# Patient Record
Sex: Male | Born: 1950
Health system: Southern US, Community
[De-identification: ages and names within clinical notes are randomized; demographics above are authoritative.]

## PROBLEM LIST (undated history)

## (undated) DIAGNOSIS — E785 Hyperlipidemia, unspecified: Secondary | ICD-10-CM

## (undated) DIAGNOSIS — I10 Essential (primary) hypertension: Secondary | ICD-10-CM

## (undated) DIAGNOSIS — G5600 Carpal tunnel syndrome, unspecified upper limb: Secondary | ICD-10-CM

## (undated) HISTORY — DX: Essential (primary) hypertension: I10

## (undated) HISTORY — PX: OTHER SURGICAL HISTORY: SHX169

## (undated) HISTORY — DX: Hyperlipidemia, unspecified: E78.5

## (undated) HISTORY — DX: Carpal tunnel syndrome, unspecified upper limb: G56.00

---

## 2003-12-22 ENCOUNTER — Ambulatory Visit: Payer: Self-pay | Admitting: Family Medicine

## 2004-12-14 ENCOUNTER — Ambulatory Visit: Payer: Self-pay | Admitting: Family Medicine

## 2004-12-28 ENCOUNTER — Ambulatory Visit: Payer: Self-pay | Admitting: Family Medicine

## 2005-01-31 ENCOUNTER — Ambulatory Visit: Payer: Self-pay | Admitting: Family Medicine

## 2005-03-09 ENCOUNTER — Ambulatory Visit: Payer: Self-pay | Admitting: Family Medicine

## 2005-03-22 ENCOUNTER — Ambulatory Visit: Payer: Self-pay | Admitting: Family Medicine

## 2007-01-15 ENCOUNTER — Telehealth: Payer: Self-pay | Admitting: Family Medicine

## 2007-01-15 ENCOUNTER — Ambulatory Visit: Payer: Self-pay | Admitting: Family Medicine

## 2007-01-15 DIAGNOSIS — F528 Other sexual dysfunction not due to a substance or known physiological condition: Secondary | ICD-10-CM | POA: Insufficient documentation

## 2007-01-15 DIAGNOSIS — I10 Essential (primary) hypertension: Secondary | ICD-10-CM | POA: Insufficient documentation

## 2007-01-15 DIAGNOSIS — G5600 Carpal tunnel syndrome, unspecified upper limb: Secondary | ICD-10-CM | POA: Insufficient documentation

## 2007-01-15 DIAGNOSIS — M109 Gout, unspecified: Secondary | ICD-10-CM | POA: Insufficient documentation

## 2007-01-25 ENCOUNTER — Encounter: Payer: Self-pay | Admitting: Family Medicine

## 2008-03-11 ENCOUNTER — Telehealth: Payer: Self-pay | Admitting: Family Medicine

## 2008-03-20 ENCOUNTER — Ambulatory Visit: Payer: Self-pay | Admitting: Family Medicine

## 2008-03-20 LAB — CONVERTED CEMR LAB
Bilirubin Urine: NEGATIVE
Blood in Urine, dipstick: NEGATIVE
Glucose, Urine, Semiquant: NEGATIVE
Ketones, urine, test strip: NEGATIVE
Nitrite: NEGATIVE
Protein, U semiquant: NEGATIVE
Specific Gravity, Urine: 1.025
Urobilinogen, UA: 0.2
WBC Urine, dipstick: NEGATIVE
pH: 5.5

## 2008-03-26 ENCOUNTER — Ambulatory Visit: Payer: Self-pay | Admitting: Family Medicine

## 2008-03-26 DIAGNOSIS — J309 Allergic rhinitis, unspecified: Secondary | ICD-10-CM | POA: Insufficient documentation

## 2008-03-26 LAB — CONVERTED CEMR LAB
ALT: 44 units/L (ref 0–53)
AST: 33 units/L (ref 0–37)
Albumin: 4.7 g/dL (ref 3.5–5.2)
Alkaline Phosphatase: 47 units/L (ref 39–117)
BUN: 16 mg/dL (ref 6–23)
Basophils Absolute: 0.1 10*3/uL (ref 0.0–0.1)
Basophils Relative: 0.9 % (ref 0.0–3.0)
Bilirubin, Direct: 0.1 mg/dL (ref 0.0–0.3)
CO2: 31 meq/L (ref 19–32)
Calcium: 10.1 mg/dL (ref 8.4–10.5)
Chloride: 103 meq/L (ref 96–112)
Cholesterol: 198 mg/dL (ref 0–200)
Creatinine, Ser: 0.9 mg/dL (ref 0.4–1.5)
Eosinophils Absolute: 0.1 10*3/uL (ref 0.0–0.7)
Eosinophils Relative: 1.9 % (ref 0.0–5.0)
GFR calc Af Amer: 111 mL/min
GFR calc non Af Amer: 92 mL/min
Glucose, Bld: 97 mg/dL (ref 70–99)
HCT: 46 % (ref 39.0–52.0)
HDL: 43.9 mg/dL (ref >39.0)
Hemoglobin: 16.3 g/dL (ref 13.0–17.0)
LDL Cholesterol: 137 mg/dL — ABNORMAL HIGH (ref 0–99)
Lymphocytes Relative: 29.4 % (ref 12.0–46.0)
MCHC: 35.4 g/dL (ref 30.0–36.0)
MCV: 87.5 fL (ref 78.0–100.0)
Monocytes Absolute: 0.5 10*3/uL (ref 0.1–1.0)
Monocytes Relative: 7.1 % (ref 3.0–12.0)
Neutro Abs: 4.1 10*3/uL (ref 1.4–7.7)
Neutrophils Relative %: 60.7 % (ref 43.0–77.0)
PSA: 1.74 ng/mL (ref 0.10–4.00)
Platelets: 230 10*3/uL (ref 150–400)
Potassium: 4.1 meq/L (ref 3.5–5.1)
RBC: 5.25 M/uL (ref 4.22–5.81)
RDW: 12.1 % (ref 11.5–14.6)
Sodium: 142 meq/L (ref 135–145)
TSH: 1.2 microintl units/mL (ref 0.35–5.50)
Total Bilirubin: 0.9 mg/dL (ref 0.3–1.2)
Total CHOL/HDL Ratio: 4.5
Total Protein: 8.4 g/dL — ABNORMAL HIGH (ref 6.0–8.3)
Triglycerides: 86 mg/dL (ref 0–149)
VLDL: 17 mg/dL (ref 0–40)
WBC: 6.8 10*3/uL (ref 4.5–10.5)

## 2008-05-13 ENCOUNTER — Ambulatory Visit: Payer: Self-pay | Admitting: Gastroenterology

## 2008-07-03 ENCOUNTER — Telehealth (INDEPENDENT_AMBULATORY_CARE_PROVIDER_SITE_OTHER): Payer: Self-pay | Admitting: *Deleted

## 2008-08-18 ENCOUNTER — Encounter: Payer: Self-pay | Admitting: Gastroenterology

## 2008-08-18 ENCOUNTER — Ambulatory Visit: Payer: Self-pay | Admitting: Gastroenterology

## 2008-08-18 LAB — HM COLONOSCOPY

## 2008-08-21 ENCOUNTER — Encounter: Payer: Self-pay | Admitting: Gastroenterology

## 2009-12-14 ENCOUNTER — Ambulatory Visit: Payer: Self-pay | Admitting: Family Medicine

## 2009-12-14 DIAGNOSIS — I10 Essential (primary) hypertension: Secondary | ICD-10-CM | POA: Insufficient documentation

## 2010-01-21 ENCOUNTER — Ambulatory Visit: Admit: 2010-01-21 | Payer: Self-pay | Admitting: Family Medicine

## 2010-01-28 ENCOUNTER — Ambulatory Visit: Admit: 2010-01-28 | Payer: Self-pay | Admitting: Family Medicine

## 2010-02-18 NOTE — Progress Notes (Signed)
  Phone Note Outgoing Call   Call placed by: Chales Abrahams CMA,  July 03, 2008 2:15 PM Summary of Call: need to inform pt of time change of colon on the 22nd to 3 pm left message on machine to call back  Follow-up for Phone Call        pt needs to reschedule anyway due to personal conflicts.  rescheduled per pt Follow-up by: Chales Abrahams CMA,  July 03, 2008 3:00 PM

## 2010-02-18 NOTE — Procedures (Signed)
Summary: Colonoscopy   Colonoscopy  Procedure date:  08/18/2008  Findings:      Location:  [facility].    Procedures Next Due Date:    Colonoscopy: 08/2011 COLONOSCOPY PROCEDURE REPORT  PATIENT:  Brent Mccormick, Brent Mccormick  MR#:  [id number] BIRTHDATE:   [birth year], 60 yrs. old   GENDER:   male  ENDOSCOPIST:   Brent Fee, MD Referred by: Brent Mccormick, M.D.  PROCEDURE DATE:  08/18/2008 PROCEDURE:  Colonoscopy with snare polypectomy ASA CLASS:   Class II INDICATIONS: Routine Risk Screening   MEDICATIONS:    Fentanyl 100 mcg IV, Versed 10 mg IV  DESCRIPTION OF PROCEDURE:   After the risks benefits and alternatives of the procedure were thoroughly explained, informed consent was obtained.  Digital rectal exam was performed and revealed no rectal masses.   The LB [id number] endoscope was introduced through the anus and advanced to the cecum, which was identified by both the appendix and ileocecal valve, without limitations.  The quality of the prep was good, using MoviPrep.  The instrument was then slowly withdrawn as the colon was fully examined. <<PROCEDUREIMAGES>>                  <<OLD IMAGES>>  FINDINGS:  A total of 4 polyps were found and removed. One was in transverse colon, 4-94mm, removed with cold snare and sent to pathology (jar 1). Another was in transverse colon, 2-80mm, removed with cold snare and not retreived. Another was 5mm, sessile, located in sigmoid colon, removed with cold snare, sent to pathology (jar 1). The last was 17mm, pedunculated, located in sigmoid colon, completely removed with snare/cautery and sent to pathology (jar 2) (see image4, image5, image6, image7, image9, and image10).  Moderate diverticulosis was found sigmoid to descending  This was otherwise a normal examination of the colon (see image1, image2, and image11).   Retroflexed views in the rectum revealed no abnormalities.    The scope was then withdrawn from the patient and the  procedure completed.  COMPLICATIONS:   None  ENDOSCOPIC IMPRESSION:  1) Four polyps, all removed;  three were retrieved and sent to pathology  2) Moderate diverticulosis in the sigmoid to descending  3) Otherwise normal examination  RECOMMENDATIONS:  1) If the polyp(s) removed today are proven to be adenomatous (pre-cancerous) polyps, you will need a colonoscopy in 3 years. Otherwise you should continue to follow colorectal cancer screening guidelines for "routine risk" patients with a colonoscopy in 10 years.  2) You will recieve a letter within 1-2 weeks with the results of your biopsy as well as final recommendations. Please call my office if you have not received a letter after 3 weeks.  REPEAT EXAM:   await pathology   _______________________________ Brent Fee, MD     REPORT OF SURGICAL PATHOLOGY   Case #: [id number] Patient Name: Brent Mccormick, Brent Mccormick Office Chart Number:  N/A   MRN: [id number] Pathologist: Brent Hollingshead. Delila Spence, MD DOB/Age  [birth year] (Age: 60)    Gender: M Date Taken:  08/18/2008 Date Received: 08/19/2008   FINAL DIAGNOSIS   ***MICROSCOPIC EXAMINATION AND DIAGNOSIS***   1.  COLON, TRANSVERSE AND SIGMOID, POLYPS:   -  ONE TUBULAR ADENOMA, ONE FRAGMENT OF BENIGN COLONIC MUCOSA AND TWO POLYPOID  FRAGMENTS OF HYPERPLASTIC COLONIC MUCOSA WITH UNDERLYING LEIOMYOMA                (TWO) -  NO HIGH GRADE DYSPLASIA OR INVASIVE MALIGNANCY IDENTIFIED.     2.  SIGMOID  COLON, POLYP:   -  TUBULOVILLOUS ADENOMA.  NO HIGH GRADE DYSPLASIA OR MALIGNANCY IDENTIFIED. -  BASE OF POLYP NEGATIVE FOR ADENOMATOUS CHANGE, SEE COMMENT.   COMMENT 2.  There are dilated glands with associated extravasated mucin and evidence of old hemorrhage in the underlying stroma.  These are glandular inclusions secondary to torsion and hemorrhage at this site.  No high grade dysplasia or invasive carcinoma is identified. Dr.Li concurs.    The base of the polyp is inked and is  negative for adenomatous change.  (EAA:kv 08-20-08)     kv Date Reported:  08/20/2008     Alden Server A. Delila Spence, MD   [facility] [address] [place], [place]  [address]    Dear Brent Mccormick,   The polyp(s) removed during your recent procedure were proven to be adenomatous.  These are pre-cancerous polyps that may have grown into cancers if they had not been removed.  Based on current nationally recognized surveillance guidelines, I recommend that you have a repeat colonoscopy in 3 years.   We will therefore put your information in our reminder system and will contact you in 3 years to schedule a repeat procedure.  Please call if you have any questions or concerns.       Sincerely,  Brent Fee MD  This letter has been electronically signed by your physician.  This report was created from the original endoscopy report, which was reviewed and signed by the above listed endoscopist.      Appended Document: Colonoscopy reviewed

## 2010-02-18 NOTE — Progress Notes (Signed)
Summary: rx benicar needs ov   Phone Note From Pharmacy   Caller: [facility]  Reason for Call: Needs renewal Summary of Call: benicar 40 mg  Initial call taken by: Pura Spice, RN,  March 11, 2008 1:46 PM  Follow-up for Phone Call        ok per dr Scotty Court  pt must be seen. prior to next refills  Follow-up by: Pura Spice, RN,  March 11, 2008 1:47 PM    New/Updated Medications: BENICAR 20 MG TABS (OLMESARTAN MEDOXOMIL) 1 by mouth once daily for hypertension  NEEDS OFFICE VISIT   Prescriptions: BENICAR 20 MG TABS (OLMESARTAN MEDOXOMIL) 1 by mouth once daily for hypertension  NEEDS OFFICE VISIT  #30 x 0   Entered by:   Pura Spice, RN   Authorized by:   Judithann Sheen MD   Signed by:   Pura Spice, RN on 03/11/2008   Method used:   Electronically to        [facility]* (retail)       [address]       [address] [employer]       [place], [place]  [address]       Ph: [phone]       Fax: [phone]   RxID:   [id number]

## 2010-02-18 NOTE — Assessment & Plan Note (Signed)
Summary: severe gout pain in foot/swollen/cjr   Vital Signs:  Patient profile:   60 year old male Weight:      175 pounds BMI:     26.70 O2 Sat:      98 % Temp:     98.6 degrees F Pulse rate:   71 / minute BP sitting:   140 / 84  (left arm)  Vitals Entered By: Pura Spice, RN (December 14, 2009 2:25 PM) CC: gout rt great toe /foot also has had headache since yest  refill indomethacin   History of Present Illness: Here for a gout flare in the right great toe for the past 3 days. He has run out of Indocin to take. His BP has been stable, but he has not had a cpx with labs for almost 2 years.   Allergies (verified): No Known Drug Allergies  Past History:  Past Medical History: hx carpal tunnel syndrome Gout Hypertension  Review of Systems  The patient denies anorexia, fever, weight loss, weight gain, vision loss, decreased hearing, hoarseness, chest pain, syncope, dyspnea on exertion, peripheral edema, prolonged cough, headaches, hemoptysis, abdominal pain, melena, hematochezia, severe indigestion/heartburn, hematuria, incontinence, genital sores, muscle weakness, suspicious skin lesions, transient blindness, difficulty walking, depression, unusual weight change, abnormal bleeding, enlarged lymph nodes, angioedema, breast masses, and testicular masses.    Physical Exam  General:  Well-developed,well-nourished,in no acute distress; alert,appropriate and cooperative throughout examination Lungs:  Normal respiratory effort, chest expands symmetrically. Lungs are clear to auscultation, no crackles or wheezes. Heart:  Normal rate and regular rhythm. S1 and S2 normal without gallop, murmur, click, rub or other extra sounds. Extremities:  the right great toe is red, warm, swollen, and tender at the MTP joint   Impression & Recommendations:  Problem # 1:  GOUT (ICD-274.9)  His updated medication list for this problem includes:    Indomethacin 50 Mg Caps (Indomethacin) .Marland Kitchen... 1  three times a day as needed for gout    Diclofenac Sodium 75 Mg Tbec (Diclofenac sodium) .Marland Kitchen... 1 two times a day after meals for inflammation  Orders: Depo- Medrol 40mg  (J1030) Depo- Medrol 80mg  (J1040) Admin of Therapeutic Inj  intramuscular or subcutaneous (478[address])  Problem # 2:  HYPERTENSION (ICD-401.9)  His updated medication list for this problem includes:    Benicar 20 Mg Tabs (Olmesartan medoxomil) .Marland Kitchen... 1 by mouth once daily for hypertension  needs office visit  Complete Medication List: 1)  Indomethacin 50 Mg Caps (Indomethacin) .Marland Kitchen.. 1 three times a day as needed for gout 2)  Benicar 20 Mg Tabs (Olmesartan medoxomil) .Marland Kitchen.. 1 by mouth once daily for hypertension  needs office visit 3)  Viagra 100 Mg Tabs (Sildenafil citrate) 4)  Diclofenac Sodium 75 Mg Tbec (Diclofenac sodium) .Marland Kitchen.. 1 two times a day after meals for inflammation 5)  Lorcet 10/650 10-650 Mg Tabs (Hydrocodone-acetaminophen) .Marland Kitchen.. 1 every 4-6 hrs as needed not to exceed 3 per day  Patient Instructions: 1)  set up a cpx soon  Prescriptions: INDOMETHACIN 50 MG CAPS (INDOMETHACIN) 1 three times a day as needed for gout  #60 x 2   Entered and Authorized by:   Nelwyn Salisbury MD   Signed by:   Nelwyn Salisbury MD on 12/14/2009   Method used:   [facility]        [facility] [phone][facility] (retail)       [address]       [place], [place]  [address]       Ph: [phone] or [phone]       Fax: [phone]   RxID:   [id number]    Medication Administration  Injection # 1:    Medication: Depo- Medrol 40mg     Diagnosis: GOUT (ICD-274.9)    Route: IM    Site: RUOQ gluteus    Exp Date: 05/2012    Lot #: [id number]    Mfr: Pharmacia    Patient tolerated injection without complications    Given by: Pura Spice, RN (December 14, 2009 5:39 PM)  Injection # 2:    Medication: Depo- Medrol 80mg     Diagnosis: GOUT (ICD-274.9)    Route: IM    Site: RUOQ gluteus    Exp Date:  05/2012    Lot #: [id number]    Mfr: Pharmacia    Patient tolerated injection without complications    Given by: Pura Spice, RN (December 14, 2009 5:40 PM)  Orders Added: 1)  Est. Patient Level IV [27253] 2)  Depo- Medrol 40mg  [J1030] 3)  Depo- Medrol 80mg  [J1040] 4)  Admin of Therapeutic Inj  intramuscular or subcutaneous [66440]

## 2010-02-18 NOTE — Assessment & Plan Note (Signed)
Summary: CARPAL TUNNEL FLARE/DM   Vital Signs:  Patient Profile:   60 Years Old Male Temp:     99.1 degrees F Pulse rate:   84 / minute BP sitting:   120 / 94  (left arm) Cuff size:   regular  Vitals Entered By: Pura Spice, RN (January 15, 2007 4:52 PM)                 Chief Complaint:  CARPAL TUNNEL FLARE UP RT HAND WITH TINGLING. TOOK 3 MOTRIN TODAY ADVIL YEST.Marland Kitchen  History of Present Illness: has had carpaL TUNNELL syndrome rt hand, duration for some time has been tx with wrist brace but having considerable pain wrist as well as tingling in thumb and first finger,all increased in severity forgot BP rx today  no other problems, refilled benicar with mailorder fo #90 Pt to schedule cpx  Current Allergies (reviewed today): No known allergies      Review of Systems      See HPI   Physical Exam  General:     Well-developed,well-nourished,in no acute distress; alert,appropriate and cooperative throughout examination Msk:     rt wrist swollen and painful postennell sign    Complete Medication List: 1)  Indomethacin 50 Mg Caps (Indomethacin) .Marland Kitchen.. 1 three times a day as needed for gout 2)  Benicar 20 Mg Tabs (Olmesartan medoxomil) 3)  Viagra 100 Mg Tabs (Sildenafil citrate) 4)  Diclofenac Sodium 75 Mg Tbec (Diclofenac sodium) .Marland Kitchen.. 1 two times a day after meals for inflammation 5)  Lorcet 10/650 10-650 Mg Tabs (Hydrocodone-acetaminophen) .Marland Kitchen.. 1 every 4-6 hrs as needed   Patient Instructions: 1)  depomerol injection 2)  diclofenac  3)  cold packs  4)  referral to Dr. Tharon Aquas    Prescriptions: LORCET 10/650 10-650 MG  TABS (HYDROCODONE-ACETAMINOPHEN) 1 every 4-6 hrs as needed  #60 x 5   Entered and Authorized by:   Judithann Sheen MD   Signed by:   Judithann Sheen MD on 01/16/2007   Method used:   Handwritten   RxID:   [id number] DICLOFENAC SODIUM 75 MG  TBEC (DICLOFENAC SODIUM) 1 two times a day after meals for inflammation  #60 x  11   Entered and Authorized by:   Judithann Sheen MD   Signed by:   Judithann Sheen MD on 01/16/2007   Method used:   Handwritten   RxID:   [id number]  ]  Medication Administration  Injection # 1:    Medication: Depo- Medrol 80mg     Diagnosis: CARPAL TUNNEL SYNDROME, RIGHT (ICD-354.0)    Route: IM    Site: RUOQ gluteus    Exp Date: 01/2009    Lot #: [id number]    Mfr: Pharmacia    Patient tolerated injection without complications    Given by: dr wr stafford  Injection # 2:    Medication: Depo- Medrol 40mg     Diagnosis: CARPAL TUNNEL SYNDROME, RIGHT (ICD-354.0)    Route: IM    Site: RUOQ gluteus    Exp Date: 01/2009    Lot #: [id number]    Mfr: Pharmacia    Patient tolerated injection without complications    Given by: dr wr stafford  Orders Added: 1)  Est. Patient Level III [84696] 2)  Orthopedic Surgeon Referral [Ortho Surgeon] 3)  Depo- Medrol 40mg  [J1030] 4)  Admin of Therapeutic Inj  intramuscular or subcutaneous [90772] 5)  Depo- Medrol 80mg  [J1040]

## 2010-02-18 NOTE — Letter (Signed)
Summary: GOC note  GOC note   Imported By: Kassie Mends 02/02/2007 13:52:58  _____________________________________________________________________  External Attachment:    Type:   Image     Comment:   GOC note

## 2010-02-18 NOTE — Letter (Signed)
Summary: Results Letter  [facility]  [address] [place], [place] [address]   Phone: [phone]  Fax: [phone]        August 21, 2008 MRN: [id number]    [facility] [address] [place], [place]  [address]    Dear Mr. Hennington,   The polyp(s) removed during your recent procedure were proven to be adenomatous.  These are pre-cancerous polyps that may have grown into cancers if they had not been removed.  Based on current nationally recognized surveillance guidelines, I recommend that you have a repeat colonoscopy in 3 years.   We will therefore put your information in our reminder system and will contact you in 3 years to schedule a repeat procedure.  Please call if you have any questions or concerns.       Sincerely,  Rachael Fee MD  This letter has been electronically signed by your physician.

## 2010-02-18 NOTE — Miscellaneous (Signed)
Summary: DIR COLON-SCRNG-AGE/YF  Clinical Lists Changes  Medications: Added new medication of MOVIPREP 100 GM  SOLR (PEG-KCL-NACL-NASULF-NA ASC-C) As per prep instructions. - Signed Rx of MOVIPREP 100 GM  SOLR (PEG-KCL-NACL-NASULF-NA ASC-C) As per prep instructions.;  #1 x 0;  Signed;  Entered by: Darlyn Read RN;  Authorized by: Rachael Fee MD;  Method used: [facility] to [facility] [phone]*, [address], [place], [place], [place]  [address], Ph: [phone] or [phone], Fax: [phone]    Prescriptions: MOVIPREP 100 GM  SOLR (PEG-KCL-NACL-NASULF-NA ASC-C) As per prep instructions.  #1 x 0   Entered by:   Darlyn Read RN   Authorized by:   Rachael Fee MD   Signed by:   Darlyn Read RN on 05/13/2008   Method used:   Electronically to        [facility] [phone]* (retail)       [address]       [place], [place]  [address]       Ph: [phone] or [phone]       Fax: [phone]   RxID:   [id number]

## 2010-02-18 NOTE — Progress Notes (Signed)
  Medications Added BENICAR 20 MG TABS (OLMESARTAN MEDOXOMIL)  VIAGRA 100 MG TABS (SILDENAFIL CITRATE)        Phone Note Call from Patient   Caller: Patient Call For: Dr. Scotty Court Summary of Call: Pt is c/o flare up of carpal tunnel and needs to be seen today. [phone] Initial call taken by: Lynann Beaver CMA,  January 15, 2007 8:45 AM  Follow-up for Phone Call        see if can come today at 4 pm and put in sch  please  Follow-up by: Pura Spice, RN,  January 15, 2007 10:01 AM  Additional Follow-up for Phone Call Additional follow up Details #1::        Pt. given message. Additional Follow-up by: Lynann Beaver CMA,  January 15, 2007 10:22 AM    New/Updated Medications: BENICAR 20 MG TABS (OLMESARTAN MEDOXOMIL)  VIAGRA 100 MG TABS (SILDENAFIL CITRATE)

## 2010-02-18 NOTE — Assessment & Plan Note (Signed)
Summary: cpx/mhf   Vital Signs:  Patient profile:   60 year old male Height:      68 inches Weight:      166 pounds BMI:     25.33 O2 Sat:      98 % Temp:     97.8 degrees F Pulse rate:   71 / minute BP sitting:   140 / 80  (left arm)  Vitals Entered By: Pura Spice, RN (March 26, 2008 9:43 AM)  History of Present Illness: pt in for cpx BP under control Dr. Butler Denmark resolved carpal tunnel syndreome arthritis undr control atrhritis doing well  Allergies (verified): No Known Drug Allergies  Review of Systems      See HPI General:  Denies chills, fatigue, fever, loss of appetite, malaise, sleep disorder, sweats, weakness, and weight loss. Eyes:  Denies blurring, discharge, double vision, eye irritation, eye pain, halos, itching, light sensitivity, red eye, vision loss-1 eye, and vision loss-both eyes. ENT:  Denies decreased hearing, difficulty swallowing, ear discharge, earache, hoarseness, nasal congestion, nosebleeds, postnasal drainage, ringing in ears, sinus pressure, and sore throat. CV:  Denies bluish discoloration of lips or nails, chest pain or discomfort, difficulty breathing at night, difficulty breathing while lying down, fainting, fatigue, leg cramps with exertion, lightheadness, near fainting, palpitations, shortness of breath with exertion, swelling of feet, swelling of hands, and weight gain. Resp:  Denies chest discomfort, chest pain with inspiration, cough, coughing up blood, excessive snoring, hypersomnolence, morning headaches, pleuritic, shortness of breath, sputum productive, and wheezing. GI:  Denies abdominal pain, bloody stools, change in bowel habits, constipation, dark tarry stools, diarrhea, excessive appetite, gas, hemorrhoids, indigestion, loss of appetite, nausea, vomiting, vomiting blood, and yellowish skin color. GU:  Denies decreased libido, discharge, dysuria, erectile dysfunction, genital sores, hematuria, incontinence, nocturia, urinary frequency,  and urinary hesitancy. MS:  See HPI; Complains of joint pain. Derm:  Denies changes in color of skin, changes in nail beds, dryness, excessive perspiration, flushing, hair loss, insect bite(s), itching, lesion(s), poor wound healing, and rash. Neuro:  Denies brief paralysis, difficulty with concentration, disturbances in coordination, falling down, headaches, inability to speak, memory loss, numbness, poor balance, seizures, sensation of room spinning, tingling, tremors, visual disturbances, and weakness. Psych:  Denies alternate hallucination ( auditory/visual), anxiety, depression, easily angered, easily tearful, irritability, mental problems, panic attacks, sense of great danger, suicidal thoughts/plans, thoughts of violence, unusual visions or sounds, and thoughts /plans of harming others. Endo:  Denies cold intolerance, excessive hunger, excessive thirst, excessive urination, heat intolerance, polyuria, and weight change.  Physical Exam  General:  Well-developed,well-nourished,in no acute distress; alert,appropriate and cooperative throughout examination Head:  Normocephalic and atraumatic without obvious abnormalities. No apparent alopecia or balding. Eyes:  No corneal or conjunctival inflammation noted. EOMI. Perrla. Funduscopic exam benign, without hemorrhages, exudates or papilledema. Vision grossly normal. Ears:  External ear exam shows no significant lesions or deformities.  Otoscopic examination reveals clear canals, tympanic membranes are intact bilaterally without bulging, retraction, inflammation or discharge. Hearing is grossly normal bilaterally. Nose:  External nasal examination shows no deformity or inflammation. Nasal mucosa are pink and moist without lesions or exudates. Mouth:  Oral mucosa and oropharynx without lesions or exudates.  Teeth in good repair. Neck:  No deformities, masses, or tenderness noted. Chest Wall:  No deformities, masses, tenderness or gynecomastia  noted. Breasts:  No masses or gynecomastia noted Lungs:  Normal respiratory effort, chest expands symmetrically. Lungs are clear to auscultation, no crackles or wheezes. Heart:  Normal  rate and regular rhythm. S1 and S2 normal without gallop, murmur, click, rub or other extra sounds. Abdomen:  Bowel sounds positive,abdomen soft and non-tender without masses, organomegaly or hernias noted. Rectal:  No external abnormalities noted. Normal sphincter tone. No rectal masses or tenderness. Genitalia:  Testes bilaterally descended without nodularity, tenderness or masses. No scrotal masses or lesions. No penis lesions or urethral discharge. Prostate:  Prostate gland firm and smooth, no enlargement, nodularity, tenderness, mass, asymmetry or induration. Msk:  No deformity or scoliosis noted of thoracic or lumbar spine.   Pulses:  R and L carotid,radial,femoral,dorsalis pedis and posterior tibial pulses are full and equal bilaterally Extremities:  No clubbing, cyanosis, edema, or deformity noted with normal full range of motion of all joints.   Neurologic:  No cranial nerve deficits noted. Station and gait are normal. Plantar reflexes are down-going bilaterally. DTRs are symmetrical throughout. Sensory, motor and coordinative functions appear intact. Skin:  Intact without suspicious lesions or rashes Cervical Nodes:  No lymphadenopathy noted Axillary Nodes:  No palpable lymphadenopathy Inguinal Nodes:  No significant adenopathy Psych:  Cognition and judgment appear intact. Alert and cooperative with normal attention span and concentration. No apparent delusions, illusions, hallucinations   Impression & Recommendations:  Problem # 1:  GOUT (ICD-274.9) Assessment Improved  His updated medication list for this problem includes:    Indomethacin 50 Mg Caps (Indomethacin) .Marland Kitchen... 1 three times a day as needed for gout    Diclofenac Sodium 75 Mg Tbec (Diclofenac sodium) .Marland Kitchen... 1 two times a day after meals  for inflammation  Complete Medication List: 1)  Indomethacin 50 Mg Caps (Indomethacin) .Marland Kitchen.. 1 three times a day as needed for gout 2)  Benicar 20 Mg Tabs (Olmesartan medoxomil) .Marland Kitchen.. 1 by mouth once daily for hypertension  needs office visit 3)  Viagra 100 Mg Tabs (Sildenafil citrate) 4)  Diclofenac Sodium 75 Mg Tbec (Diclofenac sodium) .Marland Kitchen.. 1 two times a day after meals for inflammation 5)  Lorcet 10/650 10-650 Mg Tabs (Hydrocodone-acetaminophen) .Marland Kitchen.. 1 every 4-6 hrs as needed not to exceed 3 per day  Other Orders: EKG w/ Interpretation (93[address]) Tdap => 42yrs IM ([address]) Admin 1st Vaccine ([address]) Gastroenterology Referral (GI) Prescriptions: LORCET 10/650 10-650 MG  TABS (HYDROCODONE-ACETAMINOPHEN) 1 every 4-6 hrs as needed not to exceed 3 per day  #60 x 5   Entered by:   Pura Spice, RN   Authorized by:   Judithann Sheen MD   Signed by:   Pura Spice, RN on 04/15/2008   Method used:   Printed then faxed to ...       [facility] [phone]* (retail)       [address]       [place], [place]  [address]       Ph: [phone] or [phone]       Fax: [phone]   RxID:   [id number]         Immunizations Administered:  Tetanus Vaccine:    Vaccine Type: Tdap    Site: left deltoid    Mfr: GlaxoSmithKline    Dose: 0.5 ml    Route: IM    Given by: Pura Spice, RN    Exp. Date: 10/02/2009    Lot #: [id number]

## 2010-03-08 ENCOUNTER — Other Ambulatory Visit: Payer: Self-pay | Admitting: Family Medicine

## 2010-04-02 ENCOUNTER — Encounter: Payer: Self-pay | Admitting: Family Medicine

## 2010-04-08 ENCOUNTER — Other Ambulatory Visit: Payer: Self-pay

## 2010-04-12 ENCOUNTER — Ambulatory Visit (INDEPENDENT_AMBULATORY_CARE_PROVIDER_SITE_OTHER): Payer: 59 | Admitting: Internal Medicine

## 2010-04-12 ENCOUNTER — Encounter: Payer: Self-pay | Admitting: Internal Medicine

## 2010-04-12 VITALS — BP 132/84 | HR 94 | Temp 98.2°F | Ht 70.0 in | Wt 163.0 lb

## 2010-04-12 DIAGNOSIS — J069 Acute upper respiratory infection, unspecified: Secondary | ICD-10-CM

## 2010-04-12 MED ORDER — DOXYCYCLINE HYCLATE 100 MG PO TABS: 100.0000 mg | ORAL_TABLET | Freq: Two times a day (BID) | ORAL | Status: DC

## 2010-04-12 MED ORDER — DOXYCYCLINE HYCLATE 100 MG PO TABS: 100.0000 mg | ORAL_TABLET | Freq: Two times a day (BID) | ORAL | Status: AC

## 2010-04-15 ENCOUNTER — Encounter: Payer: Self-pay | Admitting: Family Medicine

## 2010-04-26 ENCOUNTER — Encounter: Payer: Self-pay | Admitting: Internal Medicine

## 2010-04-26 DIAGNOSIS — J069 Acute upper respiratory infection, unspecified: Secondary | ICD-10-CM | POA: Insufficient documentation

## 2010-04-26 NOTE — Progress Notes (Signed)
  Subjective:    Patient ID: Brent Mccormick, male    DOB: 30-May-1950, 60 y.o.   MRN: [id number]  HPI Pt presents to clinic for evaluation of cough. Notes 5d h/o scratchy throat, HA, ear pain without discharge and cough productive for yellow sputum. No hemoptysis, dyspnea or wheezing. Using cough drops for sxs. +sick exposure. No alleviating or exacerbating factors.  Reviewed pmh, medications and allergies    Review of Systems see hpi     Objective:   Physical Exam  Nursing note and vitals reviewed. Constitutional: He appears well-developed and well-nourished. No distress.  HENT:  Head: Normocephalic and atraumatic.  Right Ear: Tympanic membrane, external ear and ear canal normal.  Left Ear: Tympanic membrane, external ear and ear canal normal.  Nose: Nose normal.  Mouth/Throat: Oropharynx is clear and moist. No oropharyngeal exudate.  Eyes: Conjunctivae are normal. No scleral icterus.  Neck: Neck supple.  Cardiovascular: Normal rate, regular rhythm and normal heart sounds.  Exam reveals no gallop and no friction rub.   No murmur heard. Pulmonary/Chest: Effort normal and breath sounds normal. No respiratory distress. He has no wheezes. He has no rales.  Lymphadenopathy:    He has no cervical adenopathy.  Neurological: He is alert.  Skin: Skin is warm and dry. He is not diaphoretic.          Assessment & Plan:

## 2010-04-26 NOTE — Assessment & Plan Note (Signed)
Continue sx tx. Given doxycycline to hold. Begin abx if no improvement of sx's after total of 8-10 days since onset.

## 2010-05-06 ENCOUNTER — Other Ambulatory Visit: Payer: Self-pay | Admitting: Family Medicine

## 2010-05-06 ENCOUNTER — Other Ambulatory Visit (INDEPENDENT_AMBULATORY_CARE_PROVIDER_SITE_OTHER): Payer: 59

## 2010-05-06 DIAGNOSIS — Z0000 Encounter for general adult medical examination without abnormal findings: Secondary | ICD-10-CM

## 2010-05-06 LAB — URINALYSIS
Bilirubin Urine: NEGATIVE
Hgb urine dipstick: NEGATIVE
Ketones, ur: NEGATIVE
Leukocytes, UA: NEGATIVE
Nitrite: NEGATIVE
Specific Gravity, Urine: 1.025 (ref 1.000–1.030)
Total Protein, Urine: NEGATIVE
Urine Glucose: NEGATIVE
Urobilinogen, UA: 0.2 (ref 0.0–1.0)
pH: 6 (ref 5.0–8.0)

## 2010-05-06 LAB — CBC WITH DIFFERENTIAL/PLATELET
Basophils Absolute: 0 10*3/uL (ref 0.0–0.1)
Basophils Relative: 0.6 % (ref 0.0–3.0)
Eosinophils Absolute: 0.2 10*3/uL (ref 0.0–0.7)
Eosinophils Relative: 3.1 % (ref 0.0–5.0)
HCT: 44.1 % (ref 39.0–52.0)
Hemoglobin: 15.1 g/dL (ref 13.0–17.0)
Lymphocytes Relative: 28.2 % (ref 12.0–46.0)
Lymphs Abs: 2.1 10*3/uL (ref 0.7–4.0)
MCHC: 34.1 g/dL (ref 30.0–36.0)
MCV: 88.5 fl (ref 78.0–100.0)
Monocytes Absolute: 0.5 10*3/uL (ref 0.1–1.0)
Monocytes Relative: 6.4 % (ref 3.0–12.0)
Neutro Abs: 4.7 10*3/uL (ref 1.4–7.7)
Neutrophils Relative %: 61.7 % (ref 43.0–77.0)
Platelets: 262 10*3/uL (ref 150.0–400.0)
RBC: 4.98 Mil/uL (ref 4.22–5.81)
RDW: 12.6 % (ref 11.5–14.6)
WBC: 7.6 10*3/uL (ref 4.5–10.5)

## 2010-05-06 LAB — BASIC METABOLIC PANEL
BUN: 14 mg/dL (ref 6–23)
CO2: 30 mEq/L (ref 19–32)
Calcium: 9.6 mg/dL (ref 8.4–10.5)
Chloride: 105 mEq/L (ref 96–112)
Creatinine, Ser: 0.9 mg/dL (ref 0.4–1.5)
GFR: 89.87 mL/min (ref >60.00)
Glucose, Bld: 85 mg/dL (ref 70–99)
Potassium: 4.7 mEq/L (ref 3.5–5.1)
Sodium: 141 mEq/L (ref 135–145)

## 2010-05-06 LAB — HEPATIC FUNCTION PANEL
ALT: 44 U/L (ref 0–53)
AST: 34 U/L (ref 0–37)
Albumin: 4 g/dL (ref 3.5–5.2)
Alkaline Phosphatase: 46 U/L (ref 39–117)
Bilirubin, Direct: 0.1 mg/dL (ref 0.0–0.3)
Total Bilirubin: 0.7 mg/dL (ref 0.3–1.2)
Total Protein: 7.4 g/dL (ref 6.0–8.3)

## 2010-05-06 LAB — LIPID PANEL
Cholesterol: 175 mg/dL (ref 0–200)
HDL: 43.6 mg/dL (ref >39.00)
LDL Cholesterol: 112 mg/dL — ABNORMAL HIGH (ref 0–99)
Total CHOL/HDL Ratio: 4
Triglycerides: 99 mg/dL (ref 0.0–149.0)
VLDL: 19.8 mg/dL (ref 0.0–40.0)

## 2010-05-06 LAB — TSH: TSH: 1.49 u[IU]/mL (ref 0.35–5.50)

## 2010-05-13 ENCOUNTER — Ambulatory Visit (INDEPENDENT_AMBULATORY_CARE_PROVIDER_SITE_OTHER): Payer: 59 | Admitting: Family Medicine

## 2010-05-13 VITALS — BP 102/72 | HR 65 | Temp 97.9°F | Resp 26 | Wt 159.0 lb

## 2010-05-13 DIAGNOSIS — G5600 Carpal tunnel syndrome, unspecified upper limb: Secondary | ICD-10-CM

## 2010-05-13 DIAGNOSIS — Z8249 Family history of ischemic heart disease and other diseases of the circulatory system: Secondary | ICD-10-CM

## 2010-05-13 DIAGNOSIS — I10 Essential (primary) hypertension: Secondary | ICD-10-CM

## 2010-05-13 DIAGNOSIS — Z0000 Encounter for general adult medical examination without abnormal findings: Secondary | ICD-10-CM

## 2010-05-13 DIAGNOSIS — M109 Gout, unspecified: Secondary | ICD-10-CM

## 2010-05-13 LAB — PSA: PSA: 2.72 ng/mL (ref 0.10–4.00)

## 2010-05-13 LAB — URIC ACID: Uric Acid, Serum: 7.3 mg/dL (ref 4.0–7.8)

## 2010-05-13 MED ORDER — BENICAR 20 MG PO TABS: 20.0000 mg | ORAL_TABLET | Freq: Every day | ORAL | Status: DC

## 2010-05-13 MED ORDER — SILDENAFIL CITRATE 100 MG PO TABS: 100.0000 mg | ORAL_TABLET | Freq: Every day | ORAL | Status: DC | PRN

## 2010-05-13 MED ORDER — INDOMETHACIN 50 MG PO CAPS: 50.0000 mg | ORAL_CAPSULE | Freq: Three times a day (TID) | ORAL | Status: DC

## 2010-05-14 LAB — VITAMIN D 25 HYDROXY (VIT D DEFICIENCY, FRACTURES): Vit D, 25-Hydroxy: 57 ng/mL (ref 30–89)

## 2010-05-16 ENCOUNTER — Encounter: Payer: Self-pay | Admitting: Family Medicine

## 2010-05-16 NOTE — Patient Instructions (Signed)
His echo examination revealed that you do her well electrocardiogram was normal laboratory studies are under good control We will refill your medication

## 2010-05-16 NOTE — Progress Notes (Signed)
  Subjective:    Patient ID: Brent Mccormick, male    DOB: [birth year], 60 y.o.   MRN: [id number] This 60 year old white male married and is in for and to discuss his health status as far as hypertension and other medical problems as well as his gout treatment. he is in for a health maintenance exam also appeared relates his blood pressure is been well controlled and his also died at 102/72 years he had problems with his right big toe and saw Dr. Corwin Levins Who related it was gout related patient desires to refill his medicines examHPI    Review of Systems  Constitutional: Negative.   HENT: Negative.   Eyes: Negative.   Respiratory: Negative.   Cardiovascular: Negative.   Gastrointestinal: Negative.   Genitourinary: Negative.   Musculoskeletal: Positive for arthralgias.       Gout episodesalso has problem with his right wrist which is diagnosed previously as carpal tunnel syndrome but resolved without surgery  Neurological: Negative.   Hematological: Negative.   Psychiatric/Behavioral: Negative.        Objective:   Physical Exam The patient is well well well-nourished white male who appears in no distress pleasant very cooperative  HEENT negative carotid pulses good thyroid is normal Chest and lungs completely normal breath sounds are normal no dullness to percussion no rales no wheeze and Heart no cardiomegaly heart sounds are without murmur for pulsation good and equal bilaterally echocardiogram normal Abdomen liver spleen kidneys nonpalpable lung masses felt bowel sounds were normal aorta percusses normal Genitalia normal testicles normal no hernia Rectal examination negative with prostate normal size smooth no tenderness Extremities examination negative at this time Neurological no positive finding Skin negative       Assessment & Plan:  Physical examination revealed a normal healthy male in no distress his medical problems of hypertension and gout under control 2 refill his  medications

## 2010-06-10 NOTE — Progress Notes (Signed)
Quick Note:  Pt is aware of lab results  ______

## 2010-09-07 ENCOUNTER — Other Ambulatory Visit: Payer: Self-pay | Admitting: Family Medicine

## 2010-09-08 ENCOUNTER — Other Ambulatory Visit: Payer: Self-pay | Admitting: Family Medicine

## 2010-09-08 NOTE — Telephone Encounter (Signed)
Called pt to see which pharmacy he wanted his rx to be sent to.

## 2010-09-13 ENCOUNTER — Telehealth: Payer: Self-pay | Admitting: Family Medicine

## 2010-09-13 NOTE — Telephone Encounter (Signed)
rx sent in to pharmacy.  Pt is aware.   

## 2010-09-13 NOTE — Telephone Encounter (Signed)
Pt  Requesting refill on BENICAR 20 MG tablet [[id number]]  Pt requesting you contact when prescription is placed

## 2011-05-17 ENCOUNTER — Ambulatory Visit: Payer: 59 | Admitting: Family Medicine

## 2011-05-17 ENCOUNTER — Encounter: Payer: Self-pay | Admitting: Family Medicine

## 2011-05-18 ENCOUNTER — Encounter: Payer: Self-pay | Admitting: Family Medicine

## 2011-05-18 ENCOUNTER — Ambulatory Visit (INDEPENDENT_AMBULATORY_CARE_PROVIDER_SITE_OTHER): Payer: 59 | Admitting: Family Medicine

## 2011-05-18 VITALS — BP 120/88 | Temp 98.2°F | Ht 70.0 in | Wt 172.0 lb

## 2011-05-18 DIAGNOSIS — M109 Gout, unspecified: Secondary | ICD-10-CM

## 2011-05-18 DIAGNOSIS — F528 Other sexual dysfunction not due to a substance or known physiological condition: Secondary | ICD-10-CM

## 2011-05-18 DIAGNOSIS — I10 Essential (primary) hypertension: Secondary | ICD-10-CM

## 2011-05-18 LAB — CBC WITH DIFFERENTIAL/PLATELET
Basophils Absolute: 0 10*3/uL (ref 0.0–0.1)
Basophils Relative: 0.5 % (ref 0.0–3.0)
Eosinophils Absolute: 0.1 10*3/uL (ref 0.0–0.7)
Eosinophils Relative: 1.9 % (ref 0.0–5.0)
HCT: 46.4 % (ref 39.0–52.0)
Hemoglobin: 15.4 g/dL (ref 13.0–17.0)
Lymphocytes Relative: 25.1 % (ref 12.0–46.0)
Lymphs Abs: 1.9 10*3/uL (ref 0.7–4.0)
MCHC: 33.2 g/dL (ref 30.0–36.0)
MCV: 89.5 fl (ref 78.0–100.0)
Monocytes Absolute: 0.6 10*3/uL (ref 0.1–1.0)
Monocytes Relative: 8 % (ref 3.0–12.0)
Neutro Abs: 4.9 10*3/uL (ref 1.4–7.7)
Neutrophils Relative %: 64.5 % (ref 43.0–77.0)
Platelets: 201 10*3/uL (ref 150.0–400.0)
RBC: 5.18 Mil/uL (ref 4.22–5.81)
RDW: 13.2 % (ref 11.5–14.6)
WBC: 7.6 10*3/uL (ref 4.5–10.5)

## 2011-05-18 LAB — HEPATIC FUNCTION PANEL
ALT: 37 U/L (ref 0–53)
AST: 32 U/L (ref 0–37)
Albumin: 4.3 g/dL (ref 3.5–5.2)
Alkaline Phosphatase: 45 U/L (ref 39–117)
Bilirubin, Direct: 0.1 mg/dL (ref 0.0–0.3)
Total Bilirubin: 0.4 mg/dL (ref 0.3–1.2)
Total Protein: 7.5 g/dL (ref 6.0–8.3)

## 2011-05-18 LAB — POCT URINALYSIS DIPSTICK
Bilirubin, UA: NEGATIVE
Blood, UA: NEGATIVE
Glucose, UA: NEGATIVE
Ketones, UA: NEGATIVE
Leukocytes, UA: NEGATIVE
Nitrite, UA: NEGATIVE
Protein, UA: NEGATIVE
Spec Grav, UA: 1.02
Urobilinogen, UA: 0.2
pH, UA: 6.5

## 2011-05-18 LAB — BASIC METABOLIC PANEL
BUN: 18 mg/dL (ref 6–23)
CO2: 29 mEq/L (ref 19–32)
Calcium: 9.3 mg/dL (ref 8.4–10.5)
Chloride: 102 mEq/L (ref 96–112)
Creatinine, Ser: 1 mg/dL (ref 0.4–1.5)
GFR: 82.23 mL/min (ref >60.00)
Glucose, Bld: 92 mg/dL (ref 70–99)
Potassium: 5.1 mEq/L (ref 3.5–5.1)
Sodium: 140 mEq/L (ref 135–145)

## 2011-05-18 LAB — LDL CHOLESTEROL, DIRECT: Direct LDL: 127.4 mg/dL

## 2011-05-18 LAB — LIPID PANEL
Cholesterol: 204 mg/dL — ABNORMAL HIGH (ref 0–200)
HDL: 55.6 mg/dL (ref >39.00)
Total CHOL/HDL Ratio: 4
Triglycerides: 117 mg/dL (ref 0.0–149.0)
VLDL: 23.4 mg/dL (ref 0.0–40.0)

## 2011-05-18 LAB — PSA: PSA: 2.38 ng/mL (ref 0.10–4.00)

## 2011-05-18 LAB — TSH: TSH: 1.41 u[IU]/mL (ref 0.35–5.50)

## 2011-05-18 LAB — URIC ACID: Uric Acid, Serum: 8.1 mg/dL — ABNORMAL HIGH (ref 4.0–7.8)

## 2011-05-18 MED ORDER — ALLOPURINOL 100 MG PO TABS: 100.0000 mg | ORAL_TABLET | Freq: Every day | ORAL | Status: DC

## 2011-05-18 MED ORDER — SILDENAFIL CITRATE 100 MG PO TABS: 100.0000 mg | ORAL_TABLET | Freq: Every day | ORAL | Status: DC | PRN

## 2011-05-18 MED ORDER — LOSARTAN POTASSIUM 25 MG PO TABS: 25.0000 mg | ORAL_TABLET | Freq: Every day | ORAL | Status: DC

## 2011-05-18 NOTE — Patient Instructions (Signed)
When you finish the Benicar begin Cozaar one tablet daily  Check your blood pressure daily in the morning to be sure your blood pressure stays normal,,,,,,,,,,,, 135/85 or less,,,,,,,,,, do this for one month then if normal check your blood pressure weekly going forward  Begin allopurinol 1 tablet daily to prevent gout  You can purchase the Viagra by contacting [email or web address]  Return in one year sooner if any problems

## 2011-05-18 NOTE — Progress Notes (Signed)
  Subjective:    Patient ID: Brent Mccormick, male    DOB: 1951-01-10, 61 y.o.   MRN: [id number]  HPI Brent Mccormick is a 61 year old married male nonsmoker self-employed Surveyor, quantity business who comes in today as a new patient for evaluation of hypertension gout and erectile dysfunction  He's been on Benicar 20 mg a day BP 120/80 will switch to Cozaar  He takes indomethacin 3 times a day for 5 times year because of episodes of gout. He's had uric acids drawn some of been elevated some of been normal. Recommended he start low-dose allopurinol  He takes Viagra 100 mg when necessary for DJD  He gets routine eye care, dental care, colonoscopy did because his previous colonoscopy 5 years ago showed some polyps which were removed  Tetanus 2010 information given on shingles   Review of Systems  Constitutional: Negative.   HENT: Negative.   Eyes: Negative.   Respiratory: Negative.   Cardiovascular: Negative.   Gastrointestinal: Negative.   Genitourinary: Negative.   Musculoskeletal: Negative.   Skin: Negative.   Neurological: Negative.   Hematological: Negative.   Psychiatric/Behavioral: Negative.        Objective:   Physical Exam  Constitutional: He is oriented to person, place, and time. He appears well-developed and well-nourished.  HENT:  Head: Normocephalic and atraumatic.  Right Ear: External ear normal.  Left Ear: External ear normal.  Nose: Nose normal.  Mouth/Throat: Oropharynx is clear and moist.  Eyes: Conjunctivae and EOM are normal. Pupils are equal, round, and reactive to light.  Neck: Normal range of motion. Neck supple. No JVD present. No tracheal deviation present. No thyromegaly present.  Cardiovascular: Normal rate, regular rhythm, normal heart sounds and intact distal pulses.  Exam reveals no gallop and no friction rub.   No murmur heard. Pulmonary/Chest: Effort normal and breath sounds normal. No stridor. No respiratory distress. He has no wheezes. He has no  rales. He exhibits no tenderness.  Abdominal: Soft. Bowel sounds are normal. He exhibits no distension and no mass. There is no tenderness. There is no rebound and no guarding.  Genitourinary: Rectum normal, prostate normal and penis normal. Guaiac negative stool. No penile tenderness.  Musculoskeletal: Normal range of motion. He exhibits no edema and no tenderness.  Lymphadenopathy:    He has no cervical adenopathy.  Neurological: He is alert and oriented to person, place, and time. He has normal reflexes. No cranial nerve deficit. He exhibits normal muscle tone.  Skin: Skin is warm and dry. No rash noted. No erythema. No pallor.       Total body skin exam normal he has a garden variety of freckles moles seborrheic keratoses and capillary hemangiomas  Psychiatric: He has a normal mood and affect. His behavior is normal. Judgment and thought content normal.          Assessment & Plan:  Healthy male  Hypertension switch to Cozaar 25 mg daily  History of gout begin allopurinol 1 daily check uric acid level  Erectile dysfunction continue Viagra when necessary  Recommended the shingles vaccine

## 2011-05-27 ENCOUNTER — Ambulatory Visit (INDEPENDENT_AMBULATORY_CARE_PROVIDER_SITE_OTHER): Payer: 59 | Admitting: Internal Medicine

## 2011-05-27 ENCOUNTER — Encounter: Payer: Self-pay | Admitting: Internal Medicine

## 2011-05-27 VITALS — BP 134/88 | Temp 98.1°F | Wt 170.0 lb

## 2011-05-27 DIAGNOSIS — S63501A Unspecified sprain of right wrist, initial encounter: Secondary | ICD-10-CM

## 2011-05-27 DIAGNOSIS — S63509A Unspecified sprain of unspecified wrist, initial encounter: Secondary | ICD-10-CM

## 2011-05-27 NOTE — Assessment & Plan Note (Signed)
61 y/o male with symptoms of right wrist sprain.  Apply ice pack to 3 times a day and continue to immobilize for 1 to 2 weeks. Use ibuprofen 400-600 mg twice a day as needed. If persistent symptoms we discussed referral to Dr. Josephine Igo for further evaluation.

## 2011-05-27 NOTE — Patient Instructions (Signed)
Call our office within 1-2 weeks if your wrist pain is not getting better You can continue to use over the counter ibuprofen 400-600 mg twice daily as needed for pain for 1 week.

## 2011-05-27 NOTE — Progress Notes (Signed)
  Subjective:    Patient ID: Brent Mccormick, male    DOB: 1950/12/21, 61 y.o.   MRN: [id number]  HPI  61 year old white male complains of right wrist pain. He was mowing his grass 2 days ago when the front wheel off his tractor came off which jerked the steering wheel. He twisted his wrist. He did not have immediate pain but later developed sharp pain on lateral aspect of right wrist. He rates severity 6/10. Pain with any kind of twisting motion. Mild improvement with over-the-counter ibuprofen. No numbness or tingling in his hand.   Review of Systems No redness, occasionally pain radiates to right elbow    Past Medical History  Diagnosis Date  . Carpal tunnel syndrome   . Gout   . Hypertension     History   Social History  . Marital Status: Married    Spouse Name: N/A    Number of Children: N/A  . Years of Education: N/A   Occupational History  . Not on file.   Social History Main Topics  . Smoking status: Former Games developer  . Smokeless tobacco: Never Used  . Alcohol Use: Yes  . Drug Use: No  . Sexually Active: Yes   Other Topics Concern  . Not on file   Social History Narrative  . No narrative on file    No past surgical history on file.  No family history on file.  No Known Allergies  Current Outpatient Prescriptions on File Prior to Visit  Medication Sig Dispense Refill  . allopurinol (ZYLOPRIM) 100 MG tablet Take 1 tablet (100 mg total) by mouth daily.  100 tablet  3  . HYDROcodone-acetaminophen (LORCET) 10-650 MG per tablet Take 1 tablet by mouth every 6 (six) hours as needed. Do not exceed 3 per day       . indomethacin (INDOCIN) 50 MG capsule Take 1 capsule (50 mg total) by mouth 3 (three) times daily with meals. As needed for gout  90 capsule  11  . losartan (COZAAR) 25 MG tablet Take 1 tablet (25 mg total) by mouth daily.  100 tablet  3  . sildenafil (VIAGRA) 100 MG tablet Take 1 tablet (100 mg total) by mouth daily as needed.  10 tablet  11    BP 134/88   Temp(Src) 98.1 F (36.7 C) (Oral)  Wt 170 lb (77.111 kg)    Objective:   Physical Exam  Constitutional: He appears well-developed and well-nourished.  Cardiovascular: Normal rate, regular rhythm and normal heart sounds.   Pulmonary/Chest: Effort normal and breath sounds normal. He has no wheezes.  Musculoskeletal:       Mild edema of right wrist.  Mild pain ulnar aspect of wrist with palpation and ulnar deviation.  Normal sensation.  Good hand grip  Skin: Skin is warm and dry.       Assessment & Plan:

## 2011-05-30 ENCOUNTER — Emergency Department (INDEPENDENT_AMBULATORY_CARE_PROVIDER_SITE_OTHER): Payer: 59

## 2011-05-30 ENCOUNTER — Encounter (HOSPITAL_COMMUNITY): Payer: Self-pay | Admitting: Emergency Medicine

## 2011-05-30 ENCOUNTER — Telehealth: Payer: Self-pay | Admitting: Family Medicine

## 2011-05-30 ENCOUNTER — Emergency Department (HOSPITAL_COMMUNITY)
Admission: EM | Admit: 2011-05-30 | Discharge: 2011-05-30 | Disposition: A | Discharge status: Home / Self Care | Payer: 59 | Source: Home / Self Care | Attending: Emergency Medicine | Admitting: Emergency Medicine

## 2011-05-30 DIAGNOSIS — S60219A Contusion of unspecified wrist, initial encounter: Secondary | ICD-10-CM

## 2011-05-30 MED ORDER — HYDROCODONE-IBUPROFEN 7.5-200 MG PO TABS: 1.0000 | ORAL_TABLET | Freq: Four times a day (QID) | ORAL | Status: AC | PRN

## 2011-05-30 NOTE — ED Notes (Signed)
Wrist pain, right wrist.  Incident occurred Wednesday.  Started hurting Thursday night, saw [facility], pain continued.  Patient came to [facility] for xray.  Seen by dr Ladon Applebaum prior to this nurse.

## 2011-05-30 NOTE — Telephone Encounter (Signed)
Left message for pt to call back  °

## 2011-05-30 NOTE — Telephone Encounter (Signed)
Pt injured rt wrist last wk. Pt came in to see Dr Artist Pais, who dx pt with a bad sprain and told pt to take ibuprofen for pain. Pt is req to get an xray. Pls call. Pt also is req a stonger pain med. Pt says that tylenol and ibuprofen makes pts sick to stomach. Pt req to get Percocet for pain. Pt is aware pcp is out of office.

## 2011-05-30 NOTE — Telephone Encounter (Signed)
Pt called and is stating that he is still having an extreme amount of pain in his wrist so much that it is keeping him from sleeping. Pt was in Friday and Saw Dr. Artist Pais and was Dx with a sprain, pt feels that he needs and x-ray done, pt also requesting pain medication so he can sleep at night. Please contact pt at [phone] or [phone]

## 2011-05-30 NOTE — Telephone Encounter (Signed)
[facility] Triage Call Report Triage Record Num: [id number] Operator: Audelia Hives Patient Name: Brent Mccormick Call Date & Time: 05/28/2011 10:45:21AM Patient Phone: [phone] PCP: Eugenio Hoes. Todd Patient Gender: Male PCP Fax : [phone] Patient DOB: 05-08-1950 [facility] Name[facility] Reason for Call: Caller: [name]; PCP: Roderick Pee.; CB#: [phone]; Call regarding Seen yesterday for sprained wrist, can something be called in for pain?; Pt calling regarding wrist pain, was in office 05/27/11 for sprained wrist, not fx. Was adivsed Motrin and ice, some relief from ice but none from Motrin, took 800 mg 05/27/11 did not get any sleep, took 400 mg this am. Sprained wrist on 05/24/11. Mod swelling reported. Rates pain a 8. Emergent s/s for Wrist Injury r/o per protocol except for see in ED due to unbearable pain, will proceed to UC. Protocol(s) Used: Wrist Injury Recommended Outcome per Protocol: See ED Immediately Reason for Outcome: Unbearable pain Care Advice: ~ Another adult should drive. ~ Apply cloth-covered ice pack or a cool compress to the area while in transit to reduce pain and swelling. ~ Support injured part in position of comfort to reduce pain and prevent further damage; avoid unnecessary movement. ~ IMMEDIATE ACTION 05/

## 2011-05-30 NOTE — Discharge Instructions (Signed)
We discussed her x-ray results and have recommended that you've followup of her hand orthopedic provider. As I suspect you might need further diagnostic and treatment alternatives including perhaps some physical therapy. In the meantime use is provided splint and keep your hand elevated as much as possible.

## 2011-05-30 NOTE — ED Provider Notes (Signed)
History     CSN: [id number]  Arrival date & time 05/30/11  1436   First MD Initiated Contact with Patient 05/30/11 1445      Chief Complaint  Patient presents with  . Wrist Pain    (Consider location/radiation/quality/duration/timing/severity/associated sxs/prior treatment) HPI Comments: The lawnmower lost one of its wheel and steering wheel, jerked abruptly twisting my R wrist, its been tender, swollen ans sore since hen, saw my doctor, they told me to follow-up in one week if not any better, I had this splint and put it on, its hurting bad and more so when i try to move my writs in any way , it hurts on both sides of my wrist (poinst to both radial al ulnar aspect of R writs volar aspect). No numbness or tingling of fingers, no distal weakness.  Patient is a 61 y.o. male presenting with wrist pain. The history is provided by the patient.  Wrist Pain The current episode started more than 2 days ago. The problem occurs constantly. The problem has not changed since onset.The symptoms are aggravated by twisting. The symptoms are relieved by nothing. He has tried a cold compress for the symptoms. The treatment provided no relief.    Past Medical History  Diagnosis Date  . Carpal tunnel syndrome   . Gout   . Hypertension     History reviewed. No pertinent past surgical history.  No family history on file.  History  Substance Use Topics  . Smoking status: Former Games developer  . Smokeless tobacco: Never Used  . Alcohol Use: Yes      Review of Systems  Constitutional: Negative for fever and chills.  Musculoskeletal: Positive for joint swelling. Negative for arthralgias.  Skin: Negative for rash and wound.  Neurological: Negative for weakness and numbness.    Allergies  Review of patient's allergies indicates no known allergies.  Home Medications   Current Outpatient Rx  Name Route Sig Dispense Refill  . NAPROXEN SODIUM 220 MG PO TABS Oral Take 220 mg by mouth 2 (two) times  daily with a meal.    . ALLOPURINOL 100 MG PO TABS Oral Take 1 tablet (100 mg total) by mouth daily. 100 tablet 3  . HYDROCODONE-ACETAMINOPHEN 10-650 MG PO TABS Oral Take 1 tablet by mouth every 6 (six) hours as needed. Do not exceed 3 per day     . HYDROCODONE-IBUPROFEN 7.5-200 MG PO TABS Oral Take 1 tablet by mouth every 6 (six) hours as needed for pain. 30 tablet 0  . INDOMETHACIN 50 MG PO CAPS Oral Take 1 capsule (50 mg total) by mouth 3 (three) times daily with meals. As needed for gout 90 capsule 11  . LOSARTAN POTASSIUM 25 MG PO TABS Oral Take 1 tablet (25 mg total) by mouth daily. 100 tablet 3  . SILDENAFIL CITRATE 100 MG PO TABS Oral Take 1 tablet (100 mg total) by mouth daily as needed. 10 tablet 11    BP 146/94  Pulse 88  Temp(Src) 98.1 F (36.7 C) (Oral)  Resp 18  SpO2 97%  Physical Exam  Constitutional: He appears well-developed and well-nourished.  Musculoskeletal: He exhibits tenderness.       Right wrist: He exhibits decreased range of motion, tenderness, bony tenderness and swelling. He exhibits no effusion, no crepitus and no laceration.       Arms: Neurological: He is alert.  Skin: No rash noted. No erythema.    ED Course  Procedures (including critical care time)  Labs Reviewed -  No data to display Dg Wrist Complete Right  05/30/2011  *RADIOLOGY REPORT*  Clinical Data: Right wrist twist injury 05/25/2011, continued pain  RIGHT WRIST - COMPLETE 3+ VIEW  Comparison: None  Findings: Osseous mineralization normal. Chondrocalcinosis at triangular fibrocartilage complex question CPPD/pseudogout. Joint spaces preserved. No acute fracture, dislocation, or bone destruction.  IMPRESSION: No acute abnormalities. Chondrocalcinosis question CPPD/pseudogout.  Original Report Authenticated By: Lollie Marrow, M.D.     1. Contusion of wrist       MDM  R writs contusion- no neurovascular deficits or visible fracture on today x-rays-abnormal x-rays along with signs of  inflammatory arthropathy also suspect a inter-ligamental injury strain-sprain or even tear.Recommended immobilization and  Follow-up with hand orthopedic doctor for further evaluation and perhaps physical therapy.Ptaient agreed with treatment plan and follow-up.        Jimmie Molly, MD 05/30/11 1743

## 2011-05-30 NOTE — Telephone Encounter (Signed)
Call in tramadol 50 mg bid prn.  # 30. No refill.   Ok for order for wrist x ray.   Also arrange referral to Dr. Teressa Senter

## 2011-05-31 ENCOUNTER — Telehealth: Payer: Self-pay | Admitting: *Deleted

## 2011-05-31 NOTE — Telephone Encounter (Signed)
[facility] went to [facility] and he got an xray and pain meds

## 2011-05-31 NOTE — Telephone Encounter (Signed)
Patient is calling because the Hydrocodone is making him very nauseous and having trouble sleeping.  He would like to know if the Tramadol can be called in?

## 2011-06-01 MED ORDER — TRAMADOL HCL 50 MG PO TABS: 50.0000 mg | ORAL_TABLET | Freq: Two times a day (BID) | ORAL | Status: AC

## 2011-06-01 NOTE — Telephone Encounter (Signed)
Tramadol was sent to pharmacy.  Patient is aware and will call back if no improvement.

## 2011-06-01 NOTE — Telephone Encounter (Signed)
Ok for rx of tramadol 50 mg # 30 one po bid prn.  No refill

## 2011-06-01 NOTE — Telephone Encounter (Signed)
Call pt - is he planning to proceed with referral to Dr. Teressa Senter.  Is his wrist pain better?  If not, pt should consider course of prednisone before he see Dr. Teressa Senter

## 2011-06-03 ENCOUNTER — Telehealth: Payer: Self-pay

## 2011-06-03 NOTE — Telephone Encounter (Signed)
Fleet Contras please call and refer him to [facility]. Dr. Cleophas Dunker,,,,,,,,,,,,, he can call and make his own appointment use [place] as a referral source

## 2011-06-03 NOTE — Telephone Encounter (Signed)
Patient called triage line indicating his wrist is no better, patient is taking pain medication and it only helps for a short time frame. Patient was told to call if wrist patient unresolved. Patient verified pharmacy as [facility] in [place] (phone numbers on file verified)  Dr.Yoo please further advise

## 2011-06-03 NOTE — Telephone Encounter (Signed)
Patient is aware and agrees. 

## 2011-06-08 NOTE — Telephone Encounter (Signed)
Left message for pt to call back  °

## 2011-06-08 NOTE — Telephone Encounter (Signed)
Was pt contacted?

## 2011-06-09 NOTE — Telephone Encounter (Signed)
Fleet Contras handled this.  Per pt Dr Tawanna Cooler has seen him since since then

## 2011-06-14 ENCOUNTER — Encounter: Payer: Self-pay | Admitting: Family Medicine

## 2011-06-14 ENCOUNTER — Ambulatory Visit (INDEPENDENT_AMBULATORY_CARE_PROVIDER_SITE_OTHER): Payer: 59 | Admitting: Family Medicine

## 2011-06-14 VITALS — BP 120/84 | Temp 98.3°F | Wt 166.0 lb

## 2011-06-14 DIAGNOSIS — M109 Gout, unspecified: Secondary | ICD-10-CM

## 2011-06-14 MED ORDER — TRAMADOL HCL 50 MG PO TABS: ORAL_TABLET | ORAL | Status: DC

## 2011-06-14 MED ORDER — METHYLPREDNISOLONE ACETATE 80 MG/ML IJ SUSP
120.0000 mg | Freq: Once | INTRAMUSCULAR | Status: AC
  Administered 2011-06-14: 120 mg via INTRAMUSCULAR

## 2011-06-14 NOTE — Telephone Encounter (Signed)
Patient was seen today in office by Dr Tawanna Cooler

## 2011-06-14 NOTE — Patient Instructions (Signed)
Stop the oral steroids and the indomethacin  Continue the allopurinol 200 mg daily  Tramadol one half tab 3 times daily as needed for foot and wrist pain  Return in 3 weeks for followup

## 2011-06-14 NOTE — Telephone Encounter (Signed)
What was done for pt.  Has his wrist pain resolved?

## 2011-06-14 NOTE — Progress Notes (Signed)
Addended by: Kern Reap B on: 06/14/2011 01:21 PM   Modules accepted: Orders

## 2011-06-14 NOTE — Progress Notes (Signed)
  Subjective:    Patient ID: Brent Mccormick, male    DOB: 15-Feb-1950, 61 y.o.   MRN: [id number]  HPI Brent Mccormick is a 61 year old male who comes in today for treatment of gout  Couple weeks ago he came in and was seen and evaluated for his sprained right wrist. He subsequently went to [facility] and then went to the hand surgeon Dr. Margaree Mackintosh. He's currently on a splint and is involved with physical therapy. Dr. Margaree Mackintosh gave him indomethacin 50 mg 3 times daily and prednisone 2 tabs x3 days with a taper. 2 weeks ago he noticed pain in his left foot. He wants a shot of steroids he says to Dr. Scotty Court always gave him a shot  His allopurinol was recently increased to 200 mg daily because his uric acid level was 8.1 on 100 mg daily    Review of Systems General and metabolic review of systems otherwise negative    Objective:   Physical Exam Well-developed and nourished man in acute distress examination of the feet were normal except for some slight erythema the left great toe       Assessment & Plan:  Gout slowly resolving a shot of steroids per patient request

## 2011-06-16 ENCOUNTER — Telehealth: Payer: Self-pay | Admitting: Family Medicine

## 2011-06-16 NOTE — Telephone Encounter (Signed)
Left message on machine returning patient's call 

## 2011-06-16 NOTE — Telephone Encounter (Signed)
Pt is requesting rachel to return his call concerning medication

## 2011-06-16 NOTE — Telephone Encounter (Signed)
Spoke with patient and his toe is better but his right wrist is still swollen.  He will call back if no improvement.

## 2011-06-21 ENCOUNTER — Other Ambulatory Visit: Payer: Self-pay | Admitting: Family Medicine

## 2011-06-21 DIAGNOSIS — M109 Gout, unspecified: Secondary | ICD-10-CM

## 2011-06-22 ENCOUNTER — Telehealth: Payer: Self-pay | Admitting: Family Medicine

## 2011-06-22 NOTE — Telephone Encounter (Signed)
Patient called stating that his gout is no better and he can hardly walk. Please advise.

## 2011-06-23 NOTE — Telephone Encounter (Signed)
Fleet Contras please call [facility],,,,,,,,,, we have sent a request for a rheumatology consult and I would check with his surgeon Dr. Margaree Mackintosh any other options in the meantime

## 2011-06-23 NOTE — Telephone Encounter (Signed)
Left detailed message on machine for patient 

## 2011-06-23 NOTE — Telephone Encounter (Signed)
Spoke with patient and he is feeling a little better today.  Referral request sent for rheumatologist.  He would like something for the pain or another possible shot?

## 2011-07-05 ENCOUNTER — Ambulatory Visit: Payer: 59 | Admitting: Family Medicine

## 2011-07-12 ENCOUNTER — Other Ambulatory Visit: Payer: Self-pay | Admitting: *Deleted

## 2011-07-12 MED ORDER — TRAMADOL HCL 50 MG PO TABS: ORAL_TABLET | ORAL | Status: DC

## 2011-07-12 NOTE — Telephone Encounter (Signed)
Rx sent 

## 2011-07-12 NOTE — Telephone Encounter (Signed)
Patient is requesting a refill of Tramadol is this okay to fill?

## 2011-07-12 NOTE — Telephone Encounter (Signed)
Tramadol 50 mg dispense 50 tablets directions one half to one tablet twice a day when necessary for pain refills x1

## 2011-09-02 ENCOUNTER — Encounter: Payer: Self-pay | Admitting: Gastroenterology

## 2011-10-07 ENCOUNTER — Ambulatory Visit (INDEPENDENT_AMBULATORY_CARE_PROVIDER_SITE_OTHER): Payer: 59

## 2011-10-07 DIAGNOSIS — Z23 Encounter for immunization: Secondary | ICD-10-CM

## 2011-12-13 ENCOUNTER — Other Ambulatory Visit: Payer: Self-pay | Admitting: Family Medicine

## 2011-12-19 ENCOUNTER — Other Ambulatory Visit: Payer: Self-pay | Admitting: *Deleted

## 2011-12-19 NOTE — Telephone Encounter (Signed)
Patient is requesting a refill of prednisone.  Is this okay to fill?

## 2011-12-19 NOTE — Telephone Encounter (Signed)
Left message on machine for patient to return our call 

## 2011-12-19 NOTE — Telephone Encounter (Signed)
Fleet Contras please this for what reason

## 2011-12-21 ENCOUNTER — Telehealth: Payer: Self-pay | Admitting: *Deleted

## 2011-12-21 NOTE — Telephone Encounter (Signed)
Patient states that he has been taking predinson 5 mg daily per the rheumatologist for pseudo gout.  Okay to fill?

## 2011-12-21 NOTE — Telephone Encounter (Signed)
Rx request faxed to Dr Ewell Poe office.

## 2011-12-21 NOTE — Telephone Encounter (Signed)
Since he has gotten the medication  from the rheumatologist I would recommend he call dermatology for refills months for

## 2012-03-07 ENCOUNTER — Encounter: Payer: Self-pay | Admitting: Family Medicine

## 2012-03-07 ENCOUNTER — Ambulatory Visit (INDEPENDENT_AMBULATORY_CARE_PROVIDER_SITE_OTHER): Payer: 59 | Admitting: Family Medicine

## 2012-03-07 VITALS — BP 140/90 | Temp 98.4°F | Wt 173.0 lb

## 2012-03-07 DIAGNOSIS — M5126 Other intervertebral disc displacement, lumbar region: Secondary | ICD-10-CM

## 2012-03-07 DIAGNOSIS — M5116 Intervertebral disc disorders with radiculopathy, lumbar region: Secondary | ICD-10-CM | POA: Insufficient documentation

## 2012-03-07 MED ORDER — DIAZEPAM 5 MG PO TABS: ORAL_TABLET | ORAL | Status: DC

## 2012-03-07 MED ORDER — HYDROCODONE-ACETAMINOPHEN 7.5-750 MG PO TABS: ORAL_TABLET | ORAL | Status: DC

## 2012-03-07 NOTE — Progress Notes (Signed)
  Subjective:    Patient ID: Brent Som Sr., male    DOB: Mar 21, 1950, 62 y.o.   MRN: [id number]  HPIMichael is a 62 year old married male nonsmoker who comes in today for evaluation of low back pain  He states on the 13th of this month last week he was shoveling snow the next day the 14th he began experiencing the gradual onset of severe left lumbar low back pain. The pain is constant, sharp, an 8 on a scale of 1-10. It bleeds down to his left hip and posterior left thigh to the knee no further. No numbness muscle strength seems normal to him. No history of trauma. He an episode like this a while back with moving heavy boxes that episode lasts about 3 days he took some Motrin and it resolved spontaneously.  No history of bowel or bladder dysfunction    Review of Systems    neurologic review of systems otherwise negative Objective:   Physical Exam  Well-developed well-nourished male in no acute distress in the supine position both legs were of equal length, sensation muscle strength reflexes within normal limits. Decrease range of motion right and left hip right worse than left only about 15 of external rotation of right hip straight leg raising negative peripheral pulses normal skin normal      Assessment & Plan:  Lumbar disease with radiation down left posterior thigh plan bed rest for 2 days, and Motrin 400 twice a day, Valium and Vicodin 3 times a day when necessary for pain return

## 2012-03-07 NOTE — Patient Instructions (Signed)
At bedrest today Thursday and Friday  Saturday get up walk,,,,,,,, lie down,,,,,,,, no sitting  Motrin 400 mg twice daily with food  Valium and Vicodin,,,,,,,,, one half to one of each 3 times daily for severe pain  On Saturday when you become and her story then just take a half of Valium and Vicodin at bedtime  Milk of magnesia to prevent constipation  Return on Monday for followup

## 2012-03-12 ENCOUNTER — Encounter: Payer: Self-pay | Admitting: Family Medicine

## 2012-03-12 ENCOUNTER — Ambulatory Visit (INDEPENDENT_AMBULATORY_CARE_PROVIDER_SITE_OTHER): Payer: 59 | Admitting: Family Medicine

## 2012-03-12 VITALS — BP 110/70 | Temp 98.4°F | Wt 174.0 lb

## 2012-03-12 DIAGNOSIS — M5126 Other intervertebral disc displacement, lumbar region: Secondary | ICD-10-CM

## 2012-03-12 DIAGNOSIS — M5116 Intervertebral disc disorders with radiculopathy, lumbar region: Secondary | ICD-10-CM

## 2012-03-12 NOTE — Progress Notes (Signed)
  Subjective:    Patient ID: Brent Som Sr., male    DOB: 1950/07/03, 61 y.o.   MRN: [id number]  HPI Brent Mccormick is a 62 year old male who comes in today for evaluation of low back pain  We saw him last week he been shoveling snow and developed the sudden onset of severe low back pain. His neurologic exam was intact. He was placed at bedrest with Motrin 400 twice a day with food Valium 5 mg 3 times a day along with either Ultram for mild pain or Vicodin for severe pain. He comes back today saying is about 50% better. Over the weekend he did some walking lying down. No neurologic symptoms   Review of Systems    review of systems negative Objective:   Physical Exam  Well-developed and nourished male no acute distress in the supine position the legs were of equal length. Sensation muscle strength reflexes are within normal limits he's able to extend his legs and hips 90 with no pain      Assessment & Plan:  Lumbar disc disease pain was settling snow resolving slowly plan taper medicine begin home yoga

## 2012-03-12 NOTE — Patient Instructions (Addendum)
Continue the Motrin 400 mg twice daily with food and to your pain free  At bedtime if you're having back pain take a half of a Valium,,,,,,,,,,,, and a half of I. Vicodin for severe pain,,,,,,,,, or a half of a tramadol for mild to moderate pain  Begin chapter one of the other book back exercises daily in the morning  Return when necessary

## 2012-05-10 ENCOUNTER — Telehealth: Payer: Self-pay | Admitting: Family Medicine

## 2012-05-10 MED ORDER — HYDROCODONE-ACETAMINOPHEN 7.5-325 MG PO TABS: ORAL_TABLET | ORAL | Status: DC

## 2012-05-10 NOTE — Telephone Encounter (Signed)
Pt said he called pharm on Monday for refill of HYDROcodone-acetaminophen (VICODIN ES) 7.5-325 MG  Pt would like refill. Pt states tramadol give him a headache.  Pharm: [facility] on [address]

## 2012-05-10 NOTE — Telephone Encounter (Signed)
Okay per Dr Tawanna Cooler - Rx called in and Left message on machine for patient.

## 2012-05-14 ENCOUNTER — Encounter: Payer: Self-pay | Admitting: Gastroenterology

## 2012-07-03 ENCOUNTER — Other Ambulatory Visit: Payer: Self-pay | Admitting: *Deleted

## 2012-07-03 DIAGNOSIS — I10 Essential (primary) hypertension: Secondary | ICD-10-CM

## 2012-07-03 MED ORDER — LOSARTAN POTASSIUM 25 MG PO TABS: 25.0000 mg | ORAL_TABLET | Freq: Every day | ORAL | Status: DC

## 2012-08-13 ENCOUNTER — Other Ambulatory Visit: Payer: Self-pay | Admitting: *Deleted

## 2012-08-13 MED ORDER — HYDROCODONE-ACETAMINOPHEN 5-325 MG PO TABS: ORAL_TABLET | ORAL | Status: DC

## 2012-11-09 ENCOUNTER — Ambulatory Visit (INDEPENDENT_AMBULATORY_CARE_PROVIDER_SITE_OTHER): Payer: 59

## 2012-11-09 DIAGNOSIS — Z23 Encounter for immunization: Secondary | ICD-10-CM

## 2012-12-05 ENCOUNTER — Telehealth: Payer: Self-pay | Admitting: Family Medicine

## 2012-12-05 NOTE — Telephone Encounter (Signed)
Pt needs new rx hydrocodone °

## 2012-12-07 NOTE — Telephone Encounter (Signed)
Left message on machine for patient to return our call.  Per Dr Tawanna Cooler, if patient is still having back pain he should go to ortho

## 2012-12-10 NOTE — Telephone Encounter (Signed)
Patient is aware to go to Ortho.

## 2012-12-10 NOTE — Telephone Encounter (Signed)
Left message on machine returning patient's call 

## 2013-01-17 HISTORY — PX: COLONOSCOPY: SHX174

## 2013-01-17 HISTORY — PX: POLYPECTOMY: SHX149

## 2013-04-29 ENCOUNTER — Encounter: Payer: Self-pay | Admitting: Gastroenterology

## 2013-06-06 ENCOUNTER — Ambulatory Visit (AMBULATORY_SURGERY_CENTER): Payer: Self-pay | Admitting: *Deleted

## 2013-06-06 VITALS — Ht 70.0 in | Wt 159.0 lb

## 2013-06-06 DIAGNOSIS — Z86010 Personal history of colonic polyps: Secondary | ICD-10-CM

## 2013-06-06 MED ORDER — MOVIPREP 100 G PO SOLR: ORAL | Status: DC

## 2013-06-06 NOTE — Progress Notes (Signed)
No allergies to eggs or soy. No prior anesthesia.  Pt given Emmi instructions for colonoscopy  No oxygen use  No diet drug use  

## 2013-06-19 ENCOUNTER — Encounter: Payer: 59 | Admitting: Gastroenterology

## 2013-06-26 ENCOUNTER — Encounter: Payer: 59 | Admitting: Gastroenterology

## 2013-06-28 ENCOUNTER — Telehealth: Payer: Self-pay | Admitting: Family Medicine

## 2013-06-28 DIAGNOSIS — I10 Essential (primary) hypertension: Secondary | ICD-10-CM

## 2013-06-28 MED ORDER — LOSARTAN POTASSIUM 25 MG PO TABS: 25.0000 mg | ORAL_TABLET | Freq: Every day | ORAL | Status: DC

## 2013-06-28 NOTE — Telephone Encounter (Signed)
Brent Mccormick, [place] requesting refill of losartan (COZAAR) 25 MG tablet #100 w/ 3 refills

## 2013-07-31 ENCOUNTER — Ambulatory Visit (AMBULATORY_SURGERY_CENTER): Payer: 59 | Admitting: Gastroenterology

## 2013-07-31 ENCOUNTER — Encounter: Payer: Self-pay | Admitting: Gastroenterology

## 2013-07-31 VITALS — BP 143/85 | HR 57 | Temp 97.5°F | Resp 18 | Ht 70.0 in | Wt 159.0 lb

## 2013-07-31 DIAGNOSIS — D126 Benign neoplasm of colon, unspecified: Secondary | ICD-10-CM

## 2013-07-31 DIAGNOSIS — K5730 Diverticulosis of large intestine without perforation or abscess without bleeding: Secondary | ICD-10-CM

## 2013-07-31 DIAGNOSIS — Z86010 Personal history of colonic polyps: Secondary | ICD-10-CM

## 2013-07-31 DIAGNOSIS — D122 Benign neoplasm of ascending colon: Secondary | ICD-10-CM

## 2013-07-31 MED ORDER — SODIUM CHLORIDE 0.9 % IV SOLN: 500.0000 mL | INTRAVENOUS | Status: DC

## 2013-07-31 NOTE — Patient Instructions (Signed)

## 2013-07-31 NOTE — Progress Notes (Signed)
Called to room to assist during endoscopic procedure.  Patient ID and intended procedure confirmed with present staff. Received instructions for my participation in the procedure from the performing physician.  

## 2013-07-31 NOTE — Op Note (Signed)
[facility]  [address] [place], [id number]   COLONOSCOPY PROCEDURE REPORT  PATIENT: Brent Mccormick, Brent Mccormick.  MR#: [id number] BIRTHDATE: 1950-09-09 , 62  yrs. old GENDER: Male ENDOSCOPIST: Milus Banister, MD PROCEDURE DATE:  07/31/2013 PROCEDURE:   Colonoscopy with snare polypectomy First Screening Colonoscopy - Avg.  risk and is 50 yrs.  old or older - No.  Prior Negative Screening - Now for repeat screening. N/A  History of Adenoma - Now for follow-up colonoscopy & has been > or = to 3 yrs.  Yes hx of adenoma.  Has been 3 or more years since last colonoscopy.  Polyps Removed Today? Yes. ASA CLASS:   Class II INDICATIONS:adenomatous polyps (>1cm) removed 2011. MEDICATIONS: MAC sedation, administered by CRNA and propofol (Diprivan) 250mg  IV  DESCRIPTION OF PROCEDURE:   After the risks benefits and alternatives of the procedure were thoroughly explained, informed consent was obtained.  A digital rectal exam revealed no abnormalities of the rectum.   The LB [id number]  endoscope was introduced through the anus and advanced to the cecum, which was identified by both the appendix and ileocecal valve. No adverse events experienced.   The quality of the prep was excellent.  The instrument was then slowly withdrawn as the colon was fully examined.  COLON FINDINGS: Two polyps were found, removed, one was retrieved and sent to pathology.  One of them was sessile, located in ascending segment, 38mm across, removed with cold snare (jar 1). The other was semipeduculated, 34mm across, removed with snare/cautery, not retrieved.  There were multiple diverticulum throughout the colon.  The examination was otherwise normal. Retroflexed views revealed no abnormalities. The time to cecum=2 minutes 47 seconds.  Withdrawal time=8 minutes 35 seconds.  The scope was withdrawn and the procedure completed. COMPLICATIONS: There were no complications.  ENDOSCOPIC IMPRESSION: Two  polyps were found, removed, one was retrieved and sent to pathology. There were multiple diverticulum throughout the colon. The examination was otherwise normal.  RECOMMENDATIONS: If the polyp(s) removed today are proven to be adenomatous (pre-cancerous) polyps, you will need a repeat colonoscopy in 5 years.  You will receive a letter within 1-2 weeks with the results of your biopsy as well as final recommendations.  Please call my office if you have not received a letter after 3 weeks.   eSigned:  Milus Banister, MD 07/31/2013 11:20 AM   cc: Stevie Kern, MD

## 2013-08-01 ENCOUNTER — Telehealth: Payer: Self-pay

## 2013-08-01 NOTE — Telephone Encounter (Signed)
  Follow up Call-  Call back number 07/31/2013  Post procedure Call Back phone  # cell [phone]  Permission to leave phone message Yes     Patient questions:  Do you have a fever, pain , or abdominal swelling? No. Pain Score  0 *  Have you tolerated food without any problems? Yes.    Have you been able to return to your normal activities? Yes.    Do you have any questions about your discharge instructions: Diet   No. Medications  No. Follow up visit  No.  Do you have questions or concerns about your Care? No.  Actions: * If pain score is 4 or above: No action needed, pain <4.

## 2013-08-12 ENCOUNTER — Encounter: Payer: Self-pay | Admitting: Gastroenterology

## 2014-07-01 ENCOUNTER — Other Ambulatory Visit: Payer: Self-pay | Admitting: Family Medicine

## 2014-07-03 ENCOUNTER — Encounter: Payer: Self-pay | Admitting: Adult Health

## 2014-07-03 ENCOUNTER — Ambulatory Visit (INDEPENDENT_AMBULATORY_CARE_PROVIDER_SITE_OTHER): Payer: 59 | Admitting: Adult Health

## 2014-07-03 VITALS — BP 120/84 | Temp 98.2°F | Ht 70.0 in | Wt 164.5 lb

## 2014-07-03 DIAGNOSIS — I10 Essential (primary) hypertension: Secondary | ICD-10-CM

## 2014-07-03 MED ORDER — LOSARTAN POTASSIUM 25 MG PO TABS: 25.0000 mg | ORAL_TABLET | Freq: Every day | ORAL | Status: DC

## 2014-07-03 NOTE — Progress Notes (Signed)
   Subjective:    Patient ID: Brent Aloe Sr., male    DOB: February 07, 1950, 64 y.o.   MRN: [id number]  HPI Patient presents to the office today for follow up regarding his blood pressure. Today in the office his blood pressure is 120/84. He has not been seen in the office since 2014.   Denies any headache, double vision or dizziness or lightheadedness, no chest pain.,    Review of Systems  Constitutional: Negative.   HENT: Negative.   Eyes: Negative.   Respiratory: Negative.   Cardiovascular: Negative.   Musculoskeletal: Negative.   Skin: Negative.   Neurological: Negative.   Psychiatric/Behavioral: Negative.   All other systems reviewed and are negative.      Objective:   Physical Exam  Constitutional: He is oriented to person, place, and time. He appears well-nourished. No distress.  Cardiovascular: Normal rate, regular rhythm, normal heart sounds and intact distal pulses.  Exam reveals no gallop and no friction rub.   No murmur heard. Pulmonary/Chest: Effort normal and breath sounds normal. No respiratory distress. He has no wheezes. He has no rales. He exhibits tenderness.  Neurological: He is alert and oriented to person, place, and time.  Skin: Skin is warm and dry. He is not diaphoretic.  Psychiatric: He has a normal mood and affect. His behavior is normal. Judgment and thought content normal.  Nursing note and vitals reviewed.      Assessment & Plan:   1. Essential hypertension - losartan (COZAAR) 25 MG tablet; Take 1 tablet (25 mg total) by mouth daily.  Dispense: 100 tablet; Refill: 3 - Follow up in one month for CPE - Follow up sooner if needed

## 2014-07-03 NOTE — Patient Instructions (Addendum)
Losartin has been sent to the pharmacy. Please schedule a complete physical exam.    Health Maintenance A healthy lifestyle and preventative care can promote health and wellness.  Maintain regular health, dental, and eye exams.  Eat a healthy diet. Foods like vegetables, fruits, whole grains, low-fat dairy products, and lean protein foods contain the nutrients you need and are low in calories. Decrease your intake of foods high in solid fats, added sugars, and salt. Get information about a proper diet from your health care provider, if necessary.  Regular physical exercise is one of the most important things you can do for your health. Most adults should get at least 150 minutes of moderate-intensity exercise (any activity that increases your heart rate and causes you to sweat) each week. In addition, most adults need muscle-strengthening exercises on 2 or more days a week.   Maintain a healthy weight. The body mass index (BMI) is a screening tool to identify possible weight problems. It provides an estimate of body fat based on height and weight. Your health care provider can find your BMI and can help you achieve or maintain a healthy weight. For males 20 years and older:  A BMI below 18.5 is considered underweight.  A BMI of 18.5 to 24.9 is normal.  A BMI of 25 to 29.9 is considered overweight.  A BMI of 30 and above is considered obese.  Maintain normal blood lipids and cholesterol by exercising and minimizing your intake of saturated fat. Eat a balanced diet with plenty of fruits and vegetables. Blood tests for lipids and cholesterol should begin at age 80 and be repeated every 5 years. If your lipid or cholesterol levels are high, you are over age 99, or you are at high risk for heart disease, you may need your cholesterol levels checked more frequently.Ongoing high lipid and cholesterol levels should be treated with medicines if diet and exercise are not working.  If you smoke, find  out from your health care provider how to quit. If you do not use tobacco, do not start.  Lung cancer screening is recommended for adults aged 94-80 years who are at high risk for developing lung cancer because of a history of smoking. A yearly low-dose CT scan of the lungs is recommended for people who have at least a 30-pack-year history of smoking and are current smokers or have quit within the past 15 years. A pack year of smoking is smoking an average of 1 pack of cigarettes a day for 1 year (for example, a 30-pack-year history of smoking could mean smoking 1 pack a day for 30 years or 2 packs a day for 15 years). Yearly screening should continue until the smoker has stopped smoking for at least 15 years. Yearly screening should be stopped for people who develop a health problem that would prevent them from having lung cancer treatment.  If you choose to drink alcohol, do not have more than 2 drinks per day. One drink is considered to be 12 oz (360 mL) of beer, 5 oz (150 mL) of wine, or 1.5 oz (45 mL) of liquor.  Avoid the use of street drugs. Do not share needles with anyone. Ask for help if you need support or instructions about stopping the use of drugs.  High blood pressure causes heart disease and increases the risk of stroke. Blood pressure should be checked at least every 1-2 years. Ongoing high blood pressure should be treated with medicines if weight loss and exercise  are not effective.  If you are 31-69 years old, ask your health care provider if you should take aspirin to prevent heart disease.  Diabetes screening involves taking a blood sample to check your fasting blood sugar level. This should be done once every 3 years after age 42 if you are at a normal weight and without risk factors for diabetes. Testing should be considered at a younger age or be carried out more frequently if you are overweight and have at least 1 risk factor for diabetes.  Colorectal cancer can be detected and  often prevented. Most routine colorectal cancer screening begins at the age of 25 and continues through age 33. However, your health care provider may recommend screening at an earlier age if you have risk factors for colon cancer. On a yearly basis, your health care provider may provide home test kits to check for hidden blood in the stool. A small camera at the end of a tube may be used to directly examine the colon (sigmoidoscopy or colonoscopy) to detect the earliest forms of colorectal cancer. Talk to your health care provider about this at age 42 when routine screening begins. A direct exam of the colon should be repeated every 5-10 years through age 33, unless Bari forms of precancerous polyps or small growths are found.  People who are at an increased risk for hepatitis B should be screened for this virus. You are considered at high risk for hepatitis B if:  You were born in a country where hepatitis B occurs often. Talk with your health care provider about which countries are considered high risk.  Your parents were born in a high-risk country and you have not received a shot to protect against hepatitis B (hepatitis B vaccine).  You have HIV or AIDS.  You use needles to inject street drugs.  You live with, or have sex with, someone who has hepatitis B.  You are a man who has sex with other men (MSM).  You get hemodialysis treatment.  You take certain medicines for conditions like cancer, organ transplantation, and autoimmune conditions.  Hepatitis C blood testing is recommended for all people born from [birth year] through 1967 and any individual with known risk factors for hepatitis C.  Healthy men should no longer receive prostate-specific antigen (PSA) blood tests as part of routine cancer screening. Talk to your health care provider about prostate cancer screening.  Testicular cancer screening is not recommended for adolescents or adult males who have no symptoms. Screening includes  self-exam, a health care provider exam, and other screening tests. Consult with your health care provider about any symptoms you have or any concerns you have about testicular cancer.  Practice safe sex. Use condoms and avoid high-risk sexual practices to reduce the spread of sexually transmitted infections (STIs).  You should be screened for STIs, including gonorrhea and chlamydia if:  You are sexually active and are younger than 24 years.  You are older than 24 years, and your health care provider tells you that you are at risk for this type of infection.  Your sexual activity has changed since you were last screened, and you are at an increased risk for chlamydia or gonorrhea. Ask your health care provider if you are at risk.  If you are at risk of being infected with HIV, it is recommended that you take a prescription medicine daily to prevent HIV infection. This is called pre-exposure prophylaxis (PrEP). You are considered at risk if:  You are  a man who has sex with other men (MSM).  You are a heterosexual man who is sexually active with multiple partners.  You take drugs by injection.  You are sexually active with a partner who has HIV.  Talk with your health care provider about whether you are at high risk of being infected with HIV. If you choose to begin PrEP, you should first be tested for HIV. You should then be tested every 3 months for as long as you are taking PrEP.  Use sunscreen. Apply sunscreen liberally and repeatedly throughout the day. You should seek shade when your shadow is shorter than you. Protect yourself by wearing long sleeves, pants, a wide-brimmed hat, and sunglasses year round whenever you are outdoors.  Tell your health care provider of new moles or changes in moles, especially if there is a change in shape or color. Also, tell your health care provider if a mole is larger than the size of a pencil eraser.  A one-time screening for abdominal aortic aneurysm  (AAA) and surgical repair of large AAAs by ultrasound is recommended for men aged 6-75 years who are current or former smokers.  Stay current with your vaccines (immunizations). Document Released: 07/02/2007 Document Revised: 01/08/2013 Document Reviewed: 05/31/2010 [facility] Patient Information 2015 [place], [place]. This information is not intended to replace advice given to you by your health care provider. Make sure you discuss any questions you have with your health care provider.

## 2014-07-03 NOTE — Progress Notes (Signed)
Pre visit review using our clinic review tool, if applicable. No additional management support is needed unless otherwise documented below in the visit note. 

## 2014-07-10 ENCOUNTER — Encounter: Payer: Self-pay | Admitting: Gastroenterology

## 2014-07-25 ENCOUNTER — Other Ambulatory Visit (INDEPENDENT_AMBULATORY_CARE_PROVIDER_SITE_OTHER): Payer: 59

## 2014-07-25 DIAGNOSIS — Z0000 Encounter for general adult medical examination without abnormal findings: Secondary | ICD-10-CM

## 2014-07-25 LAB — LIPID PANEL
Cholesterol: 179 mg/dL (ref 0–200)
HDL: 50.9 mg/dL (ref >39.00)
LDL Cholesterol: 106 mg/dL — ABNORMAL HIGH (ref 0–99)
NonHDL: 128.1
Total CHOL/HDL Ratio: 4
Triglycerides: 110 mg/dL (ref 0.0–149.0)
VLDL: 22 mg/dL (ref 0.0–40.0)

## 2014-07-25 LAB — CBC WITH DIFFERENTIAL/PLATELET
Basophils Absolute: 0 10*3/uL (ref 0.0–0.1)
Basophils Relative: 0.6 % (ref 0.0–3.0)
Eosinophils Absolute: 0.2 10*3/uL (ref 0.0–0.7)
Eosinophils Relative: 3 % (ref 0.0–5.0)
HCT: 46.3 % (ref 39.0–52.0)
Hemoglobin: 15.7 g/dL (ref 13.0–17.0)
Lymphocytes Relative: 28.5 % (ref 12.0–46.0)
Lymphs Abs: 1.9 10*3/uL (ref 0.7–4.0)
MCHC: 33.9 g/dL (ref 30.0–36.0)
MCV: 89.2 fl (ref 78.0–100.0)
Monocytes Absolute: 0.6 10*3/uL (ref 0.1–1.0)
Monocytes Relative: 8.5 % (ref 3.0–12.0)
Neutro Abs: 4 10*3/uL (ref 1.4–7.7)
Neutrophils Relative %: 59.4 % (ref 43.0–77.0)
Platelets: 236 10*3/uL (ref 150.0–400.0)
RBC: 5.19 Mil/uL (ref 4.22–5.81)
RDW: 13.2 % (ref 11.5–15.5)
WBC: 6.8 10*3/uL (ref 4.0–10.5)

## 2014-07-25 LAB — HEPATIC FUNCTION PANEL
ALT: 61 U/L — ABNORMAL HIGH (ref 0–53)
AST: 60 U/L — ABNORMAL HIGH (ref 0–37)
Albumin: 4.1 g/dL (ref 3.5–5.2)
Alkaline Phosphatase: 51 U/L (ref 39–117)
Bilirubin, Direct: 0.1 mg/dL (ref 0.0–0.3)
Total Bilirubin: 0.6 mg/dL (ref 0.2–1.2)
Total Protein: 7.5 g/dL (ref 6.0–8.3)

## 2014-07-25 LAB — BASIC METABOLIC PANEL
BUN: 17 mg/dL (ref 6–23)
CO2: 31 mEq/L (ref 19–32)
Calcium: 9.7 mg/dL (ref 8.4–10.5)
Chloride: 101 mEq/L (ref 96–112)
Creatinine, Ser: 0.99 mg/dL (ref 0.40–1.50)
GFR: 80.45 mL/min (ref >60.00)
Glucose, Bld: 100 mg/dL — ABNORMAL HIGH (ref 70–99)
Potassium: 5 mEq/L (ref 3.5–5.1)
Sodium: 139 mEq/L (ref 135–145)

## 2014-07-25 LAB — POCT URINALYSIS DIPSTICK
Bilirubin, UA: NEGATIVE
Blood, UA: NEGATIVE
Glucose, UA: NEGATIVE
Ketones, UA: NEGATIVE
Leukocytes, UA: NEGATIVE
Nitrite, UA: NEGATIVE
Spec Grav, UA: 1.02
Urobilinogen, UA: 1
pH, UA: 7

## 2014-07-25 LAB — PSA: PSA: 4.77 ng/mL — ABNORMAL HIGH (ref 0.10–4.00)

## 2014-07-25 LAB — TSH: TSH: 2.37 u[IU]/mL (ref 0.35–4.50)

## 2014-08-01 ENCOUNTER — Encounter: Payer: Self-pay | Admitting: Adult Health

## 2014-08-01 ENCOUNTER — Ambulatory Visit (INDEPENDENT_AMBULATORY_CARE_PROVIDER_SITE_OTHER): Payer: 59 | Admitting: Adult Health

## 2014-08-01 VITALS — BP 146/86 | Temp 98.2°F | Ht 68.0 in | Wt 163.7 lb

## 2014-08-01 DIAGNOSIS — Z23 Encounter for immunization: Secondary | ICD-10-CM

## 2014-08-01 DIAGNOSIS — Z0000 Encounter for general adult medical examination without abnormal findings: Secondary | ICD-10-CM

## 2014-08-01 DIAGNOSIS — I10 Essential (primary) hypertension: Secondary | ICD-10-CM

## 2014-08-01 DIAGNOSIS — R972 Elevated prostate specific antigen [PSA]: Secondary | ICD-10-CM

## 2014-08-01 DIAGNOSIS — R748 Abnormal levels of other serum enzymes: Secondary | ICD-10-CM | POA: Diagnosis not present

## 2014-08-01 NOTE — Addendum Note (Signed)
Addended by: Apolinar Junes on: 08/01/2014 12:04 PM   Modules accepted: Level of Service, SmartSet

## 2014-08-01 NOTE — Addendum Note (Signed)
Addended by: Miles Costain T on: 08/01/2014 11:47 AM   Modules accepted: Orders

## 2014-08-01 NOTE — Addendum Note (Signed)
Addended by: Apolinar Junes on: 08/01/2014 11:44 AM   Modules accepted: Miquel Dunn

## 2014-08-01 NOTE — Patient Instructions (Signed)
It was great seeing you again today!  Someone will call you to set up an appointment for the ultrasound and urology consult.   Monitor your blood pressure and let me know what it has been running in two weeks.   Ask insurance about Prevnar and Shingles vaccination   Please do not hesitate to call if you need anything.

## 2014-08-01 NOTE — Progress Notes (Signed)
Pre visit review using our clinic review tool, if applicable. No additional management support is needed unless otherwise documented below in the visit note. 

## 2014-08-01 NOTE — Progress Notes (Addendum)
Subjective:    Patient ID: Brent Aloe Sr., male    DOB: August 06, 1950, 64 y.o.   MRN: [id number]  HPI 64 year old married, nonsmoking male who comes in for his annual medicare wellness exam. He  has a past medical history of Carpal tunnel syndrome; Gout; and Hypertension. He works in Environmental manager business.   He does routine health maintenance exams such as dental visits and eye exams.   He feels as though his HTN is well controlled on his current medication regimen. Does not check BP at home. He eats a heart healthy low sodium diet.   Denies any interval history or new medical problems.    Review of Systems  Constitutional: Negative.   HENT: Positive for nosebleeds (infrequent).   Eyes: Negative.   Respiratory: Negative.   Cardiovascular: Negative.   Gastrointestinal: Negative.   Endocrine: Negative.   Genitourinary: Negative.   Musculoskeletal: Negative.   Skin: Negative.   Allergic/Immunologic: Negative.   Neurological: Negative.   Hematological: Negative.   Psychiatric/Behavioral: Negative.   All other systems reviewed and are negative.  Past Medical History  Diagnosis Date  . Carpal tunnel syndrome   . Gout   . Hypertension     History   Social History  . Marital Status: Married    Spouse Name: N/A  . Number of Children: N/A  . Years of Education: N/A   Occupational History  . Not on file.   Social History Main Topics  . Smoking status: Former Smoker    Quit date: 01/17/1966  . Smokeless tobacco: Never Used  . Alcohol Use: Yes     Comment: occasional beer  . Drug Use: No  . Sexual Activity: Yes   Other Topics Concern  . Not on file   Social History Narrative    Past Surgical History  Procedure Laterality Date  . No prior surgery      Family History  Problem Relation Age of Onset  . Colon cancer Neg Hx     No Known Allergies  Current Outpatient Prescriptions on File Prior to Visit  Medication Sig Dispense Refill  . aspirin 81  MG tablet Take 81 mg by mouth daily.    Marland Kitchen losartan (COZAAR) 25 MG tablet Take 1 tablet (25 mg total) by mouth daily. 100 tablet 3  . Multiple Vitamin (MULTIVITAMIN) tablet Take 1 tablet by mouth daily.     No current facility-administered medications on file prior to visit.    BP 146/86 mmHg  Temp(Src) 98.2 F (36.8 C) (Oral)  Ht 5\' 8"  (1.727 m)  Wt 163 lb 11.2 oz (74.254 kg)  BMI 24.90 kg/m2       Objective:   Physical Exam  Constitutional: He is oriented to person, place, and time. He appears well-developed and well-nourished. No distress.  HENT:  Head: Normocephalic and atraumatic.  Right Ear: External ear normal.  Left Ear: External ear normal.  Nose: Nose normal.  Mouth/Throat: Oropharynx is clear and moist. No oropharyngeal exudate.  Eyes: Conjunctivae and EOM are normal. Pupils are equal, round, and reactive to light. Right eye exhibits no discharge. Left eye exhibits no discharge.  Neck: Normal range of motion. Neck supple. No tracheal deviation present.  Cardiovascular: Normal rate, regular rhythm, normal heart sounds and intact distal pulses.  Exam reveals no gallop.   No murmur heard. Pulmonary/Chest: Effort normal and breath sounds normal. No respiratory distress. He has no wheezes. He has no rales. He exhibits no tenderness.  Abdominal:  Soft. Bowel sounds are normal.  Genitourinary: Rectum normal and penis normal. Guaiac negative stool. No penile tenderness.  ? assemetry on right side of prostate  Musculoskeletal: Normal range of motion. He exhibits no edema or tenderness.  Lymphadenopathy:    He has no cervical adenopathy.  Neurological: He is alert and oriented to person, place, and time. He has normal reflexes.  Skin: Skin is warm and dry. No rash noted. No erythema.  Psychiatric: He has a normal mood and affect. His behavior is normal. Judgment and thought content normal.  Nursing note and vitals reviewed.     Assessment & Plan:  1. Encounter for Medicare  annual wellness exam - Continue to eat healthy and exercise frequently.  - We reviewed labs in detail - Will follow up regarding labs and imaging.   2. Essential hypertension - EKG 12-Lead, NSR- Rate 61 - Ambulatory referral to Urology - US Abdomen Limited RUQ; Future - His BP was elevated today in the office, BP last office visit a month ago was normal. He will monitor his BP for the next two weeks and send them to me via MyChart.  - Consider adding 12.5 HCTZ to losaratan  3. Elevated liver enzymes - Denies alcohol consumption or NSAID use. Does not eat high fat foods.  - US Abdomen Limited RUQ- Future  4. Elevated PSA - Referral to Urology

## 2014-08-18 ENCOUNTER — Ambulatory Visit
Admission: RE | Admit: 2014-08-18 | Discharge: 2014-08-18 | Disposition: A | Discharge status: Home / Self Care | Payer: 59 | Source: Ambulatory Visit | Attending: Adult Health | Admitting: Adult Health

## 2014-08-18 DIAGNOSIS — I10 Essential (primary) hypertension: Secondary | ICD-10-CM

## 2014-08-19 ENCOUNTER — Ambulatory Visit (INDEPENDENT_AMBULATORY_CARE_PROVIDER_SITE_OTHER): Payer: 59 | Admitting: *Deleted

## 2014-08-19 ENCOUNTER — Other Ambulatory Visit: Payer: Self-pay | Admitting: Adult Health

## 2014-08-19 ENCOUNTER — Telehealth: Payer: Self-pay | Admitting: *Deleted

## 2014-08-19 DIAGNOSIS — I10 Essential (primary) hypertension: Secondary | ICD-10-CM

## 2014-08-19 DIAGNOSIS — Z23 Encounter for immunization: Secondary | ICD-10-CM | POA: Diagnosis not present

## 2014-08-19 MED ORDER — LOSARTAN POTASSIUM 50 MG PO TABS: 50.0000 mg | ORAL_TABLET | Freq: Every day | ORAL | Status: DC

## 2014-08-19 NOTE — Telephone Encounter (Signed)
Left message on machine for patient to return our call 

## 2014-08-19 NOTE — Telephone Encounter (Signed)
We can go up to 50mg  on his Cozaar. Will send in prescription.   Ultrasound showed: IMPRESSION: Normal  right upper quadrant ultrasound examination.

## 2014-08-19 NOTE — Telephone Encounter (Signed)
Patient blood pressure readings at home have been averaging at 147/85 and today in the office it is 148/80.  Patient would like to know if he should change his blood pressure medication? He would also like the results from his ultrasound, of his liver done today, if possible.

## 2014-08-20 NOTE — Telephone Encounter (Signed)
Rx already sent and patient is aware

## 2014-08-20 NOTE — Telephone Encounter (Signed)
Left detailed message on machine for patient with result and information about change in dosage for Cozaar.  Patient should call back with which pharmacy he should use.

## 2014-10-24 ENCOUNTER — Encounter: Payer: Self-pay | Admitting: Adult Health

## 2014-10-24 ENCOUNTER — Ambulatory Visit (INDEPENDENT_AMBULATORY_CARE_PROVIDER_SITE_OTHER): Payer: 59 | Admitting: Adult Health

## 2014-10-24 VITALS — BP 128/74 | Temp 97.7°F | Ht 68.0 in | Wt 164.5 lb

## 2014-10-24 DIAGNOSIS — M109 Gout, unspecified: Secondary | ICD-10-CM

## 2014-10-24 DIAGNOSIS — M10071 Idiopathic gout, right ankle and foot: Secondary | ICD-10-CM

## 2014-10-24 MED ORDER — METHYLPREDNISOLONE ACETATE 80 MG/ML IJ SUSP
120.0000 mg | Freq: Once | INTRAMUSCULAR | Status: AC
  Administered 2014-10-24: 120 mg via INTRAMUSCULAR

## 2014-10-24 MED ORDER — INDOMETHACIN 25 MG PO CAPS: 25.0000 mg | ORAL_CAPSULE | Freq: Two times a day (BID) | ORAL | Status: DC

## 2014-10-24 NOTE — Progress Notes (Signed)
   Subjective:    Patient ID: Brent Aloe Sr., male    DOB: 1950/03/17, 64 y.o.   MRN: [id number]  HPI 64 year old male who presents to the office today for gout flare in his right great toe. He first noticed the redness and pain 10 days ago. He has been busy at work and has not been able to get into the office. He feels like this is his typical gout flare.   He has run out of of Indomethacin 25 mg- which he endorses helps out.  He usually gets a depo-medrol injection in the office was well during gout flares.    Review of Systems  Constitutional: Negative.   Musculoskeletal: Positive for joint swelling and arthralgias.  Skin: Positive for color change.  All other systems reviewed and are negative.  Past Medical History  Diagnosis Date  . Carpal tunnel syndrome   . Gout   . Hypertension     Social History   Social History  . Marital Status: Married    Spouse Name: N/A  . Number of Children: N/A  . Years of Education: N/A   Occupational History  . Not on file.   Social History Main Topics  . Smoking status: Former Smoker    Quit date: 01/17/1966  . Smokeless tobacco: Never Used  . Alcohol Use: Yes     Comment: occasional beer  . Drug Use: No  . Sexual Activity: Yes   Other Topics Concern  . Not on file   Social History Narrative    Past Surgical History  Procedure Laterality Date  . No prior surgery      Family History  Problem Relation Age of Onset  . Colon cancer Neg Hx     No Known Allergies  Current Outpatient Prescriptions on File Prior to Visit  Medication Sig Dispense Refill  . aspirin 81 MG tablet Take 81 mg by mouth daily.    Marland Kitchen losartan (COZAAR) 50 MG tablet Take 1 tablet (50 mg total) by mouth daily. 100 tablet 3  . Multiple Vitamin (MULTIVITAMIN) tablet Take 1 tablet by mouth daily.    . TURMERIC PO Take 1 capsule by mouth daily.     No current facility-administered medications on file prior to visit.    BP 128/74 mmHg  Temp(Src)  97.7 F (36.5 C) (Oral)  Ht 5\' 8"  (1.727 m)  Wt 164 lb 8 oz (74.617 kg)  BMI 25.02 kg/m2        Objective:   Physical Exam  Constitutional: He is oriented to person, place, and time. He appears well-developed and well-nourished. No distress.  Musculoskeletal: Normal range of motion. He exhibits edema and tenderness.  Slight redness, warmth and swelling to right great toe.   Neurological: He is alert and oriented to person, place, and time.  Skin: Skin is warm and dry. He is not diaphoretic. There is erythema.  Psychiatric: He has a normal mood and affect. His behavior is normal. Judgment and thought content normal.  Nursing note and vitals reviewed.     Assessment & Plan:  1. Acute gout of right foot, unspecified cause - indomethacin (INDOCIN) 25 MG capsule; Take 1 capsule (25 mg total) by mouth 2 (two) times daily with a meal.  Dispense: 30 capsule; Refill: 2 - methylPREDNISolone acetate (DEPO-MEDROL) injection 120 mg; Inject 1.5 mLs (120 mg total) into the muscle once. - Ice and elevation.

## 2014-10-24 NOTE — Progress Notes (Signed)
Pre visit review using our clinic review tool, if applicable. No additional management support is needed unless otherwise documented below in the visit note. 

## 2014-10-24 NOTE — Patient Instructions (Signed)
It was great seeing you again!  The Indomethacin has been sent to the pharmacy. You can start it tonight.   If you continue to have pain, redness and swelling in 2-3 days, please let me know.

## 2015-03-12 MED FILL — LOSARTAN POTASSIUM 50 MG TA: 50 | 90 days supply | Qty: 90 | Fill #2

## 2015-06-16 MED FILL — LOSARTAN POTASSIUM 50 MG TA: 50 | 90 days supply | Qty: 90 | Fill #3

## 2015-08-20 MED FILL — INDOMETHACIN 25 MG CAPSULE: 25 | 15 days supply | Qty: 30 | Fill #1

## 2015-09-15 ENCOUNTER — Other Ambulatory Visit: Payer: Self-pay | Admitting: Adult Health

## 2015-09-15 DIAGNOSIS — I10 Essential (primary) hypertension: Secondary | ICD-10-CM

## 2015-09-15 MED FILL — LOSARTAN POTASSIUM 50 MG TA: 50 | 90 days supply | Qty: 90 | Fill #0

## 2015-09-15 NOTE — Telephone Encounter (Signed)
Ok to refill? Patient last seen 10/23/14 for Gout. No upcoming appt.

## 2015-09-15 NOTE — Telephone Encounter (Signed)
Ok to refill for 90 days. All other refills should go to Dr. Sherren Mocha

## 2015-12-14 ENCOUNTER — Other Ambulatory Visit: Payer: Self-pay | Admitting: Adult Health

## 2015-12-14 DIAGNOSIS — I10 Essential (primary) hypertension: Secondary | ICD-10-CM

## 2015-12-15 ENCOUNTER — Other Ambulatory Visit: Payer: Self-pay

## 2015-12-15 DIAGNOSIS — I10 Essential (primary) hypertension: Secondary | ICD-10-CM

## 2015-12-15 MED ORDER — LOSARTAN POTASSIUM 50 MG PO TABS: 50.0000 mg | ORAL_TABLET | Freq: Every day | ORAL | 0 refills | Status: DC

## 2015-12-15 MED FILL — LOSARTAN POTASSIUM 50 MG TA: 50 | 90 days supply | Qty: 90 | Fill #0

## 2015-12-15 NOTE — Telephone Encounter (Signed)
Dr. Todd patient  

## 2015-12-15 NOTE — Telephone Encounter (Signed)
Rx for 90D sent to pharmacy. Pt aware he needs appt for further refills. Nothing further needed.

## 2015-12-15 NOTE — Telephone Encounter (Signed)
Pt is call to ck the status of his Rx

## 2015-12-17 ENCOUNTER — Ambulatory Visit (INDEPENDENT_AMBULATORY_CARE_PROVIDER_SITE_OTHER): Payer: 59 | Admitting: Adult Health

## 2015-12-17 ENCOUNTER — Encounter: Payer: Self-pay | Admitting: Adult Health

## 2015-12-17 VITALS — BP 148/90 | Temp 97.9°F | Ht 68.0 in | Wt 160.8 lb

## 2015-12-17 DIAGNOSIS — Z0000 Encounter for general adult medical examination without abnormal findings: Secondary | ICD-10-CM | POA: Diagnosis not present

## 2015-12-17 DIAGNOSIS — M109 Gout, unspecified: Secondary | ICD-10-CM

## 2015-12-17 DIAGNOSIS — Z23 Encounter for immunization: Secondary | ICD-10-CM

## 2015-12-17 DIAGNOSIS — I10 Essential (primary) hypertension: Secondary | ICD-10-CM | POA: Diagnosis not present

## 2015-12-17 LAB — LIPID PANEL
Cholesterol: 154 mg/dL (ref 0–200)
HDL: 39.4 mg/dL (ref >39.00)
LDL Cholesterol: 95 mg/dL (ref 0–99)
NonHDL: 114.59
Total CHOL/HDL Ratio: 4
Triglycerides: 98 mg/dL (ref 0.0–149.0)
VLDL: 19.6 mg/dL (ref 0.0–40.0)

## 2015-12-17 LAB — BASIC METABOLIC PANEL
BUN: 19 mg/dL (ref 6–23)
CO2: 29 mEq/L (ref 19–32)
Calcium: 9.6 mg/dL (ref 8.4–10.5)
Chloride: 104 mEq/L (ref 96–112)
Creatinine, Ser: 1.01 mg/dL (ref 0.40–1.50)
GFR: 78.28 mL/min (ref >60.00)
Glucose, Bld: 93 mg/dL (ref 70–99)
Potassium: 4.2 mEq/L (ref 3.5–5.1)
Sodium: 141 mEq/L (ref 135–145)

## 2015-12-17 LAB — CBC WITH DIFFERENTIAL/PLATELET
Basophils Absolute: 0 10*3/uL (ref 0.0–0.1)
Basophils Relative: 0.6 % (ref 0.0–3.0)
Eosinophils Absolute: 0.1 10*3/uL (ref 0.0–0.7)
Eosinophils Relative: 2 % (ref 0.0–5.0)
HCT: 47.7 % (ref 39.0–52.0)
Hemoglobin: 16.1 g/dL (ref 13.0–17.0)
Lymphocytes Relative: 31.7 % (ref 12.0–46.0)
Lymphs Abs: 1.6 10*3/uL (ref 0.7–4.0)
MCHC: 33.8 g/dL (ref 30.0–36.0)
MCV: 88.4 fl (ref 78.0–100.0)
Monocytes Absolute: 0.5 10*3/uL (ref 0.1–1.0)
Monocytes Relative: 9.2 % (ref 3.0–12.0)
Neutro Abs: 2.8 10*3/uL (ref 1.4–7.7)
Neutrophils Relative %: 56.5 % (ref 43.0–77.0)
Platelets: 186 10*3/uL (ref 150.0–400.0)
RBC: 5.4 Mil/uL (ref 4.22–5.81)
RDW: 12.9 % (ref 11.5–15.5)
WBC: 5 10*3/uL (ref 4.0–10.5)

## 2015-12-17 LAB — POC URINALSYSI DIPSTICK (AUTOMATED)
Bilirubin, UA: NEGATIVE
Blood, UA: NEGATIVE
Glucose, UA: NEGATIVE
Ketones, UA: NEGATIVE
Leukocytes, UA: NEGATIVE
Nitrite, UA: NEGATIVE
Spec Grav, UA: 1.025
Urobilinogen, UA: 0.2
pH, UA: 5

## 2015-12-17 LAB — HEPATIC FUNCTION PANEL
ALT: 95 U/L — ABNORMAL HIGH (ref 0–53)
AST: 79 U/L — ABNORMAL HIGH (ref 0–37)
Albumin: 4.3 g/dL (ref 3.5–5.2)
Alkaline Phosphatase: 48 U/L (ref 39–117)
Bilirubin, Direct: 0.2 mg/dL (ref 0.0–0.3)
Total Bilirubin: 0.7 mg/dL (ref 0.2–1.2)
Total Protein: 8.4 g/dL — ABNORMAL HIGH (ref 6.0–8.3)

## 2015-12-17 LAB — TSH: TSH: 1.83 u[IU]/mL (ref 0.35–4.50)

## 2015-12-17 LAB — PSA: PSA: 2.7 ng/mL (ref 0.10–4.00)

## 2015-12-17 MED ORDER — INDOMETHACIN 25 MG PO CAPS: 25.0000 mg | ORAL_CAPSULE | Freq: Two times a day (BID) | ORAL | 2 refills | Status: DC

## 2015-12-17 MED ORDER — LOSARTAN POTASSIUM 50 MG PO TABS: 50.0000 mg | ORAL_TABLET | Freq: Every day | ORAL | 3 refills | Status: DC

## 2015-12-17 MED FILL — INDOMETHACIN 25 MG CAPSULE: 25 | 15 days supply | Qty: 30 | Fill #0

## 2015-12-17 NOTE — Patient Instructions (Signed)
It was great seeing you today! Welcome to the team  I will follow up with you regarding your lab work.   Continue to stay active and eat healthy   Follow up in one year for your next physical or sooner if needed  Health Maintenance, Male A healthy lifestyle and preventative care can promote health and wellness.  Maintain regular health, dental, and eye exams.  Eat a healthy diet. Foods like vegetables, fruits, whole grains, low-fat dairy products, and lean protein foods contain the nutrients you need and are low in calories. Decrease your intake of foods high in solid fats, added sugars, and salt. Get information about a proper diet from your health care provider, if necessary.  Regular physical exercise is one of the most important things you can do for your health. Most adults should get at least 150 minutes of moderate-intensity exercise (any activity that increases your heart rate and causes you to sweat) each week. In addition, most adults need muscle-strengthening exercises on 2 or more days a week.   Maintain a healthy weight. The body mass index (BMI) is a screening tool to identify possible weight problems. It provides an estimate of body fat based on height and weight. Your health care provider can find your BMI and can help you achieve or maintain a healthy weight. For males 20 years and older:  A BMI below 18.5 is considered underweight.  A BMI of 18.5 to 24.9 is normal.  A BMI of 25 to 29.9 is considered overweight.  A BMI of 30 and above is considered obese.  Maintain normal blood lipids and cholesterol by exercising and minimizing your intake of saturated fat. Eat a balanced diet with plenty of fruits and vegetables. Blood tests for lipids and cholesterol should begin at age 10 and be repeated every 5 years. If your lipid or cholesterol levels are high, you are over age 68, or you are at high risk for heart disease, you may need your cholesterol levels checked more  frequently.Ongoing high lipid and cholesterol levels should be treated with medicines if diet and exercise are not working.  If you smoke, find out from your health care provider how to quit. If you do not use tobacco, do not start.  Lung cancer screening is recommended for adults aged 27-80 years who are at high risk for developing lung cancer because of a history of smoking. A yearly low-dose CT scan of the lungs is recommended for people who have at least a 30-pack-year history of smoking and are current smokers or have quit within the past 15 years. A pack year of smoking is smoking an average of 1 pack of cigarettes a day for 1 year (for example, a 30-pack-year history of smoking could mean smoking 1 pack a day for 30 years or 2 packs a day for 15 years). Yearly screening should continue until the smoker has stopped smoking for at least 15 years. Yearly screening should be stopped for people who develop a health problem that would prevent them from having lung cancer treatment.  If you choose to drink alcohol, do not have more than 2 drinks per day. One drink is considered to be 12 oz (360 mL) of beer, 5 oz (150 mL) of wine, or 1.5 oz (45 mL) of liquor.  Avoid the use of street drugs. Do not share needles with anyone. Ask for help if you need support or instructions about stopping the use of drugs.  High blood pressure causes heart  disease and increases the risk of stroke. High blood pressure is more likely to develop in:  People who have blood pressure in the end of the normal range (100-139/85-89 mm Hg).  People who are overweight or obese.  People who are African American.  If you are 69-14 years of age, have your blood pressure checked every 3-5 years. If you are 63 years of age or older, have your blood pressure checked every year. You should have your blood pressure measured twice--once when you are at a hospital or clinic, and once when you are not at a hospital or clinic. Record the  average of the two measurements. To check your blood pressure when you are not at a hospital or clinic, you can use:  An automated blood pressure machine at a pharmacy.  A home blood pressure monitor.  If you are 62-68 years old, ask your health care provider if you should take aspirin to prevent heart disease.  Diabetes screening involves taking a blood sample to check your fasting blood sugar level. This should be done once every 3 years after age 51 if you are at a normal weight and without risk factors for diabetes. Testing should be considered at a younger age or be carried out more frequently if you are overweight and have at least 1 risk factor for diabetes.  Colorectal cancer can be detected and often prevented. Most routine colorectal cancer screening begins at the age of 73 and continues through age 11. However, your health care provider may recommend screening at an earlier age if you have risk factors for colon cancer. On a yearly basis, your health care provider may provide home test kits to check for hidden blood in the stool. A small camera at the end of a tube may be used to directly examine the colon (sigmoidoscopy or colonoscopy) to detect the earliest forms of colorectal cancer. Talk to your health care provider about this at age 58 when routine screening begins. A direct exam of the colon should be repeated every 5-10 years through age 77, unless [place] forms of precancerous polyps or small growths are found.  People who are at an increased risk for hepatitis B should be screened for this virus. You are considered at high risk for hepatitis B if:  You were born in a country where hepatitis B occurs often. Talk with your health care provider about which countries are considered high risk.  Your parents were born in a high-risk country and you have not received a shot to protect against hepatitis B (hepatitis B vaccine).  You have HIV or AIDS.  You use needles to inject street  drugs.  You live with, or have sex with, someone who has hepatitis B.  You are a man who has sex with other men (MSM).  You get hemodialysis treatment.  You take certain medicines for conditions like cancer, organ transplantation, and autoimmune conditions.  Hepatitis C blood testing is recommended for all people born from [birth year] through 1967 and any individual with known risk factors for hepatitis C.  Healthy men should no longer receive prostate-specific antigen (PSA) blood tests as part of routine cancer screening. Talk to your health care provider about prostate cancer screening.  Testicular cancer screening is not recommended for adolescents or adult males who have no symptoms. Screening includes self-exam, a health care provider exam, and other screening tests. Consult with your health care provider about any symptoms you have or any concerns you have about testicular  cancer.  Practice safe sex. Use condoms and avoid high-risk sexual practices to reduce the spread of sexually transmitted infections (STIs).  You should be screened for STIs, including gonorrhea and chlamydia if:  You are sexually active and are younger than 24 years.  You are older than 24 years, and your health care provider tells you that you are at risk for this type of infection.  Your sexual activity has changed since you were last screened, and you are at an increased risk for chlamydia or gonorrhea. Ask your health care provider if you are at risk.  If you are at risk of being infected with HIV, it is recommended that you take a prescription medicine daily to prevent HIV infection. This is called pre-exposure prophylaxis (PrEP). You are considered at risk if:  You are a man who has sex with other men (MSM).  You are a heterosexual man who is sexually active with multiple partners.  You take drugs by injection.  You are sexually active with a partner who has HIV.  Talk with your health care provider about  whether you are at high risk of being infected with HIV. If you choose to begin PrEP, you should first be tested for HIV. You should then be tested every 3 months for as long as you are taking PrEP.  Use sunscreen. Apply sunscreen liberally and repeatedly throughout the day. You should seek shade when your shadow is shorter than you. Protect yourself by wearing long sleeves, pants, a wide-brimmed hat, and sunglasses year round whenever you are outdoors.  Tell your health care provider of new moles or changes in moles, especially if there is a change in shape or color. Also, tell your health care provider if a mole is larger than the size of a pencil eraser.  A one-time screening for abdominal aortic aneurysm (AAA) and surgical repair of large AAAs by ultrasound is recommended for men aged 41-75 years who are current or former smokers.  Stay current with your vaccines (immunizations).   This information is not intended to replace advice given to you by your health care provider. Make sure you discuss any questions you have with your health care provider.   Document Released: 07/02/2007 Document Revised: 01/24/2014 Document Reviewed: 05/31/2010 Elsevier Interactive Patient Education [employer].

## 2015-12-17 NOTE — Progress Notes (Signed)
Patient presents to clinic today to establish care. He is pleasant 65 year old male who  has a past medical history of Carpal tunnel syndrome; Gout; and Hypertension.   His last physical was in July 2016    Acute Concerns: Complete physical   Chronic Issues: Hypertension - He feels as though his blood pressure is well controlled with Losartan 50 mg. He has not taken his blood pressure medication this morning  Gout- Takes indomethacin as needed. Has not had a flare for about one year   Health Maintenance: Dental -- Routine  Vision -- Routine Immunizations -- Had flu shot this year and needs Prevnar 23 Colonoscopy -- 2015 - every 5 years  Diet: Eats healthy Exercise: He stays active with his wood working activities He is not followed by [employer]    Past Medical History:  Diagnosis Date  . Carpal tunnel syndrome   . Gout   . Hypertension     Past Surgical History:  Procedure Laterality Date  . no prior surgery      Current Outpatient Prescriptions on File Prior to Visit  Medication Sig Dispense Refill  . aspirin 81 MG tablet Take 81 mg by mouth daily.    . Multiple Vitamin (MULTIVITAMIN) tablet Take 1 tablet by mouth daily.    . TURMERIC PO Take 1 capsule by mouth daily.     No current facility-administered medications on file prior to visit.     No Known Allergies  Family History  Problem Relation Age of Onset  . Ovarian cancer Mother   . Heart disease Father   . Hypertension Father   . Colon cancer Neg Hx     Social History   Social History  . Marital status: Married    Spouse name: N/A  . Number of children: N/A  . Years of education: N/A   Occupational History  . Not on file.   Social History Main Topics  . Smoking status: Former Smoker    Quit date: 01/17/1966  . Smokeless tobacco: Never Used  . Alcohol use Yes     Comment: occasional beer  . Drug use: No  . Sexual activity: Yes   Other Topics Concern  . 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    Married    Two children, live locally.       Three dogs and two cats     Review of Systems  Constitutional: Negative.   HENT: Negative.   Eyes: Negative.   Respiratory: Negative.   Cardiovascular: Negative.   Gastrointestinal: Negative.   Genitourinary: Negative.   Musculoskeletal: Negative.   Skin: Negative.   Neurological: Negative.   Endo/Heme/Allergies: Negative.   Psychiatric/Behavioral: Negative.   All other systems reviewed and are negative.   BP (!) 148/90   Temp 97.9 F (36.6 C) (Oral)   Ht 5\' 8"  (1.727 m)   Wt 160 lb 12.8 oz (72.9 kg)   BMI 24.45 kg/m   Physical Exam  Constitutional: He is oriented to person, place, and time and well-developed, well-nourished, and in no distress. No distress.  HENT:  Head: Normocephalic and atraumatic.  Right Ear: External ear normal.  Left Ear: External ear normal.  Nose: Nose normal.  Mouth/Throat: Oropharynx is clear and moist. No oropharyngeal exudate.  Eyes: Conjunctivae and EOM are normal. Pupils are equal, round, and reactive to light. Right eye exhibits  no discharge. Left eye exhibits no discharge. No scleral icterus.  Neck: Normal range of motion. Neck supple. No JVD present. No tracheal deviation present. No thyromegaly present.  Cardiovascular: Normal rate, regular rhythm, normal heart sounds and intact distal pulses.  Exam reveals no gallop and no friction rub.   No murmur heard. Pulmonary/Chest: Effort normal and breath sounds normal. No respiratory distress. He has no wheezes. He has no rales. He exhibits no tenderness.  Abdominal: Soft. Bowel sounds are normal. He exhibits no distension and no mass. There is no tenderness. There is no rebound and no guarding.  Genitourinary: Rectum normal. Rectal exam shows guaiac negative stool. Prostate is enlarged.  Musculoskeletal: Normal range of motion. He exhibits no  edema, tenderness or deformity.  Lymphadenopathy:    He has no cervical adenopathy.  Neurological: He is alert and oriented to person, place, and time. He has normal reflexes. He displays normal reflexes. No cranial nerve deficit. He exhibits normal muscle tone. Gait normal. Coordination normal. GCS score is 15.  Skin: Skin is warm and dry. No rash noted. He is not diaphoretic. No erythema. No pallor.  Psychiatric: Mood, memory, affect and judgment normal.  Nursing note and vitals reviewed.   Assessment/Plan: 1. Routine general medical examination at a health care facility - Will get prevnar 23  Vaccination today  - Continue to stay active and exercise.  - Follow up in one year or sooner if needed - EKG 12-Lead- Sinus  Bradycardia  WITHIN NORMAL LIMITS, Rate 57 - Basic metabolic panel - CBC with Differential/Platelet - Hepatic function panel - Lipid panel - POCT Urinalysis Dipstick (Automated) - PSA - TSH  2. Essential hypertension - losartan (COZAAR) 50 MG tablet; Take 1 tablet (50 mg total) by mouth daily.  Dispense: 90 tablet; Refill: 3 - EKG [id number] - Basic metabolic panel - CBC with Differential/Platelet - Hepatic function panel - Lipid panel - POCT Urinalysis Dipstick (Automated) - PSA - TSH - Monitor BP at home and inform this writer if he has blood pressure readings consistently above 140/90.   3. Acute gout of right foot, unspecified cause  - indomethacin (INDOCIN) 25 MG capsule; Take 1 capsule (25 mg total) by mouth 2 (two) times daily with a meal.  Dispense: 30 capsule; Refill: 2  4. Need for 23-polyvalent pneumococcal polysaccharide vaccine  - Pneumococcal polysaccharide vaccine 23-valent greater than or equal to 2yo subcutaneous/IM  Dorothyann Peng, NP

## 2015-12-17 NOTE — Progress Notes (Signed)
Pre visit review using our clinic review tool, if applicable. No additional management support is needed unless otherwise documented below in the visit note. 

## 2015-12-18 DIAGNOSIS — Z0000 Encounter for general adult medical examination without abnormal findings: Secondary | ICD-10-CM | POA: Diagnosis not present

## 2015-12-18 NOTE — Addendum Note (Signed)
Addended by: Sandria Bales B on: 12/18/2015 08:30 AM   Modules accepted: Orders

## 2015-12-19 LAB — HEPATITIS PANEL, ACUTE
HCV Ab: REACTIVE — AB
Hep A IgM: NONREACTIVE
Hep B C IgM: NONREACTIVE
Hepatitis B Surface Ag: NEGATIVE

## 2015-12-22 LAB — HEPATITIS C RNA QUANTITATIVE
HCV Quantitative Log: 6.22 {Log} — ABNORMAL HIGH (ref <1.18)
HCV Quantitative: 1642665 IU/mL — ABNORMAL HIGH (ref <15)

## 2015-12-23 ENCOUNTER — Telehealth: Payer: Self-pay | Admitting: Adult Health

## 2015-12-23 DIAGNOSIS — B1920 Unspecified viral hepatitis C without hepatic coma: Secondary | ICD-10-CM

## 2015-12-23 NOTE — Telephone Encounter (Signed)
Spoke to patient and informed him of his labs. He did test positive for Hep C. He denies being in the [employer], injecting drug use or blood transfusion.   I am going to send him to Hepatitis C clinic for treatment. He is ok with this plan

## 2015-12-23 NOTE — Telephone Encounter (Signed)
Pt is returning cory call from yesterday

## 2015-12-23 NOTE — Telephone Encounter (Signed)
See below

## 2016-01-20 ENCOUNTER — Ambulatory Visit (INDEPENDENT_AMBULATORY_CARE_PROVIDER_SITE_OTHER): Payer: 59 | Admitting: Internal Medicine

## 2016-01-20 ENCOUNTER — Encounter: Payer: Self-pay | Admitting: Internal Medicine

## 2016-01-20 DIAGNOSIS — B182 Chronic viral hepatitis C: Secondary | ICD-10-CM | POA: Diagnosis not present

## 2016-01-20 LAB — CBC WITH DIFFERENTIAL/PLATELET
Basophils Absolute: 40 cells/uL (ref 0–200)
Basophils Relative: 1 %
Eosinophils Absolute: 80 cells/uL (ref 15–500)
Eosinophils Relative: 2 %
HCT: 47.2 % (ref 38.5–50.0)
Hemoglobin: 15.8 g/dL (ref 13.2–17.1)
Lymphocytes Relative: 30 %
Lymphs Abs: 1200 cells/uL (ref 850–3900)
MCH: 29.7 pg (ref 27.0–33.0)
MCHC: 33.5 g/dL (ref 32.0–36.0)
MCV: 88.7 fL (ref 80.0–100.0)
MPV: 10.5 fL (ref 7.5–12.5)
Monocytes Absolute: 440 cells/uL (ref 200–950)
Monocytes Relative: 11 %
Neutro Abs: 2240 cells/uL (ref 1500–7800)
Neutrophils Relative %: 56 %
Platelets: 164 10*3/uL (ref 140–400)
RBC: 5.32 MIL/uL (ref 4.20–5.80)
RDW: 13.6 % (ref 11.0–15.0)
WBC: 4 10*3/uL (ref 3.8–10.8)

## 2016-01-20 LAB — COMPLETE METABOLIC PANEL WITH GFR
ALT: 112 U/L — ABNORMAL HIGH (ref 9–46)
AST: 117 U/L — ABNORMAL HIGH (ref 10–35)
Albumin: 3.8 g/dL (ref 3.6–5.1)
Alkaline Phosphatase: 55 U/L (ref 40–115)
BUN: 18 mg/dL (ref 7–25)
CO2: 28 mmol/L (ref 20–31)
Calcium: 9.2 mg/dL (ref 8.6–10.3)
Chloride: 102 mmol/L (ref 98–110)
Creat: 0.95 mg/dL (ref 0.70–1.25)
GFR, Est African American: >89 mL/min (ref >=60)
GFR, Est Non African American: 82 mL/min (ref >=60)
Glucose, Bld: 101 mg/dL — ABNORMAL HIGH (ref 65–99)
Potassium: 4.4 mmol/L (ref 3.5–5.3)
Sodium: 137 mmol/L (ref 135–146)
Total Bilirubin: 0.5 mg/dL (ref 0.2–1.2)
Total Protein: 7.6 g/dL (ref 6.1–8.1)

## 2016-01-20 LAB — PROTIME-INR
INR: 1
Prothrombin Time: 10.4 s (ref 9.0–11.5)

## 2016-01-20 LAB — HEPATITIS B SURFACE ANTIGEN: Hepatitis B Surface Ag: NEGATIVE

## 2016-01-20 LAB — HEPATITIS B CORE ANTIBODY, TOTAL: Hep B Core Total Ab: REACTIVE — AB

## 2016-01-20 LAB — HEPATITIS A ANTIBODY, TOTAL: Hep A Total Ab: NONREACTIVE

## 2016-01-20 LAB — HEPATITIS B SURFACE ANTIBODY,QUALITATIVE: Hep B S Ab: NEGATIVE

## 2016-01-20 NOTE — Patient Instructions (Signed)
Date 01/20/16  Dear Brent Mccormick, As discussed in the Brent Mccormick, your hepatitis C therapy will include highly effective medication(s) for treatment and will vary based on the type of hepatitis C and insurance approval.  Potential medications include:          Harvoni (sofosbuvir 90mg /ledipasvir 400mg ) tablet oral daily          OR     Epclusa (sofosbuvir 400mg /velpatasvir 100mg ) tablet oral daily          OR      Mavyret (glecaprevir 100 mg/pibrentasvir 40 mg): Take 3 tablets oral daily          OR     Zepatier (elbasvir 50 mg/grazoprevir 100 mg) oral daily, +/- ribavirin              Medications are typically for 8 or 12 weeks total ---------------------------------------------------------------- Your HCV Treatment Start Date: You will be notified by our office once the medication is approved and where you can pick it up (or if mailed)   ---------------------------------------------------------------- Brent Mccormick:   Brent Mccormick Hours: Monday to Friday 7:30 am to 6:00 pm   Please always contact your pharmacy at least 3-4 business days before you run out of medications to ensure your next month's medication is ready or 1 week prior to running out if you receive it by mail.  Remember, each prescription is for 28 days. ---------------------------------------------------------------- GENERAL NOTES REGARDING YOUR HEPATITIS C MEDICATION:  Some medications have the following interactions:  - Acid reducing agents such as H2 blockers (ie. Pepcid (famotidine), Zantac (ranitidine), Tagamet (cimetidine), Axid (nizatidine) and proton pump inhibitors (ie. Prilosec (omeprazole), Protonix (pantoprazole), Nexium (esomeprazole), or Aciphex (rabeprazole)). Do not take until you have discussed with a health care provider.    -Antacids that contain magnesium and/or aluminum hydroxide (ie. Milk of Magensia, Rolaids, Gaviscon, Maalox, Mylanta, an dArthritis Pain  Formula).  -Calcium carbonate (calcium supplements or antacids such as Tums, Caltrate, Os-Cal).  -St. John's wort or any products that contain St. John's wort like some herbal supplements  Please inform the office prior to starting any of these medications.  - The common side effects associated with Harvoni include:      1. Fatigue      2. Headache      3. Nausea      4. Diarrhea      5. Insomnia  Please note that this only lists the most common side effects and is NOT a comprehensive list of the potential side effects of these medications. For more information, please review the drug information sheets that come with your medication package from the pharmacy.  ---------------------------------------------------------------- GENERAL HELPFUL HINTS ON HCV THERAPY: 1. Stay well-hydrated. 2. Notify the ID Mccormick of any changes in your other over-the-counter/herbal or prescription medications. 3. If you miss a dose of your medication, take the missed dose as soon as you remember. Return to your regular time/dose schedule the next day.  4.  Do not stop taking your medications without first talking with your healthcare provider. 5.  You may take Tylenol (acetaminophen), as long as the dose is less than 2000 mg (OR no more than 4 tablets of the Tylenol Extra Strengths 500mg  tablet) in 24 hours. 6.  You will see our pharmacist-specialist within the first 2 weeks of starting your medication to monitor for any possible side effects. 7.  You will have labs once during treatment, soon after treatment completion and one final lab 6 months  after treatment completion to verify the virus is out of your system.  Scharlene Gloss, Brent Mccormick for Brent Mccormick Brent Mccormick Brent Mccormick Brent Mccormick, Brent Mccormick  [phone]

## 2016-01-20 NOTE — Progress Notes (Signed)
[facility]   CC: consideration for treatment for chronic hepatitis C  HPI:  +Brent Waas Sr. is a 67 y.o. male who presents for initial evaluation and management of chronic hepatitis C.  Patient tested positive recently during routine screening. Hepatitis C-associated risk factors present are: none. Patient denies intranasal drug use, IV drug abuse, multiple sexual partners, renal dialysis, sexual contact with person with liver disease, tattoos. Patient has had other studies performed. Results: hepatitis C RNA by PCR, result: positive. Patient has not had prior treatment for Hepatitis C. Patient does not have a past history of liver disease. Patient does not have a family history of liver disease. Patient does not  have associated signs or symptoms related to liver disease.  Labs reviewed and confirm chronic hepatitis C with a positive viral load.   Records reviewed from Epic and positive RNA.        Patient does not have documented immunity to Hepatitis A. Patient does not have documented immunity to Hepatitis B.    Review of Systems:  Constitutional: negative for fatigue and malaise Gastrointestinal: negative for diarrhea Integument/breast: negative for rash Musculoskeletal: negative for myalgias and arthralgias All other systems reviewed and are negative       Past Medical History:  Diagnosis Date  . Carpal tunnel syndrome   . Gout   . Hypertension     Prior to Admission medications   Medication Sig Start Date End Date Taking? Authorizing Provider  aspirin 81 MG tablet Take 81 mg by mouth daily.   Yes Historical Provider, MD  indomethacin (INDOCIN) 25 MG capsule Take 1 capsule (25 mg total) by mouth 2 (two) times daily with a meal. 12/17/15  Yes Dorothyann Peng, NP  losartan (COZAAR) 50 MG tablet Take 1 tablet (50 mg total) by mouth daily. 12/17/15  Yes Dorothyann Peng, NP  Multiple Vitamin (MULTIVITAMIN) tablet Take 1 tablet by mouth daily.   Yes  Historical Provider, MD  TURMERIC PO Take 1 capsule by mouth daily.   Yes Historical Provider, MD    No Known Allergies  Social History  Substance Use Topics  . Smoking status: Former Smoker    Quit date: 01/17/1966  . Smokeless tobacco: Never Used  . Alcohol use Yes     Comment: occasional beer    Family History  Problem Relation Age of Onset  . Ovarian cancer Mother   . Heart disease Father   . Hypertension Father   . Colon cancer Neg Hx   no cirrhosis, no liver cancer   Objective:  Constitutional: in no apparent distress and alert,  Vitals:   01/20/16 1022  BP: (!) 167/97  Pulse: 71  Temp: 98 F (36.7 C)   Eyes: anicteric Cardiovascular: Cor RRR Respiratory: CTA B; normal respiratory effort Gastrointestinal: Bowel sounds are normal, liver is not enlarged, spleen is not enlarged Musculoskeletal: no pedal edema noted Skin: negatives: no rash; no porphyria cutanea tarda Lymphatic: no cervical lymphadenopathy   Laboratory Genotype: No results found for: HCVGENOTYPE HCV viral load:  Lab Results  Component Value Date   HCVQUANT [id number] (H) 12/18/2015   Lab Results  Component Value Date   WBC 5.0 12/17/2015   HGB 16.1 12/17/2015   HCT 47.7 12/17/2015   MCV 88.4 12/17/2015   PLT 186.0 12/17/2015    Lab Results  Component Value Date   CREATININE 1.01 12/17/2015   BUN 19 12/17/2015   NA 141 12/17/2015   K 4.2 12/17/2015  CL 104 12/17/2015   CO2 29 12/17/2015    Lab Results  Component Value Date   ALT 95 (H) 12/17/2015   AST 79 (H) 12/17/2015   ALKPHOS 48 12/17/2015     Labs and history reviewed and show CHILD-PUGH A  5-6 points: Child class A 7-9 points: Child class B 10-15 points: Child class C  Lab Results  Component Value Date   BILITOT 0.7 12/17/2015   ALBUMIN 4.3 12/17/2015     Assessment: New Patient with Chronic Hepatitis C genotype unknown, untreated.  I discussed with the patient the lab findings that confirm chronic hepatitis C  as well as the natural history and progression of disease including about 30% of people who develop cirrhosis of the liver if left untreated and once cirrhosis is established there is a 2-7% risk per year of liver cancer and liver failure.  I discussed the importance of treatment and benefits in reducing the risk, even if significant liver fibrosis exists.   Plan: 1) Patient counseled extensively on limiting acetaminophen to no more than 2 grams daily, avoidance of alcohol. 2) Transmission discussed with patient including sexual transmission, sharing razors and toothbrush.   3) Will need referral to gastroenterology if concern for cirrhosis 4) Will need referral for substance abuse counseling: No.; Further work up to include urine drug screen  No. 5) Will prescribe appropriate medication based on genotype and coverage  6) Hepatitis A and B titers 7) Pneumovax vaccine previously given 9) Further work up to include liver staging with elastography 10) will follow up after starting medication

## 2016-01-21 LAB — HIV ANTIBODY (ROUTINE TESTING W REFLEX): HIV 1&2 Ab, 4th Generation: NONREACTIVE

## 2016-01-23 LAB — HEPATITIS C GENOTYPE

## 2016-01-25 MED ORDER — LEDIPASVIR-SOFOSBUVIR 90-400 MG PO TABS: 1.0000 | ORAL_TABLET | Freq: Every day | ORAL | 2 refills | Status: DC

## 2016-01-25 NOTE — Addendum Note (Signed)
Addended by: Thayer Headings on: 01/25/2016 06:45 AM   Modules accepted: Orders

## 2016-01-28 ENCOUNTER — Ambulatory Visit (HOSPITAL_COMMUNITY)
Admission: RE | Admit: 2016-01-28 | Discharge: 2016-01-28 | Disposition: A | Discharge status: Home / Self Care | Payer: 59 | Source: Ambulatory Visit | Attending: Internal Medicine | Admitting: Internal Medicine

## 2016-01-28 DIAGNOSIS — B182 Chronic viral hepatitis C: Secondary | ICD-10-CM

## 2016-01-28 DIAGNOSIS — B1920 Unspecified viral hepatitis C without hepatic coma: Secondary | ICD-10-CM | POA: Diagnosis not present

## 2016-02-05 MED FILL — HARVONI 90-400 MG TABLET: 90-400 | 28 days supply | Qty: 28 | Fill #0

## 2016-02-08 ENCOUNTER — Encounter: Payer: Self-pay | Admitting: Pharmacy Technician

## 2016-03-01 MED FILL — HARVONI 90-400 MG TABLET: 90-400 | 28 days supply | Qty: 28 | Fill #1

## 2016-03-02 ENCOUNTER — Ambulatory Visit: Payer: 59 | Admitting: Pharmacist

## 2016-03-02 DIAGNOSIS — B182 Chronic viral hepatitis C: Secondary | ICD-10-CM

## 2016-03-02 NOTE — Progress Notes (Signed)
HPI: Brent Peace Sr. is a 66 y.o. male who presents to the [place] clinic today for follow-up of his Hep C infection.  He has genotype 1a, F2/F3, and started Harvoni around 1/17.   Lab Results  Component Value Date   HCVGENOTYPE 1a 01/20/2016    Allergies: No Known Allergies  Past Medical History: Past Medical History:  Diagnosis Date  . Carpal tunnel syndrome   . Gout   . Hypertension     Social History: Social History   Social History  . Marital status: Married    Spouse name: N/A  . Number of children: N/A  . Years of education: N/A   Social History Main Topics  . Smoking status: Former Smoker    Quit date: 01/17/1966  . Smokeless tobacco: Never Used  . Alcohol use Yes     Comment: occasional beer  . Drug use: No  . Sexual activity: Yes   Other Topics Concern  . 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    Married    Two children, live locally.       Three dogs and two cats     Labs: Hep B S Ab (no units)  Date Value  01/20/2016 NEG   Hepatitis B Surface Ag (no units)  Date Value  01/20/2016 NEGATIVE   HCV Ab (no units)  Date Value  12/18/2015 REACTIVE (A)    Lab Results  Component Value Date   HCVGENOTYPE 1a 01/20/2016    Hepatitis C RNA quantitative Latest Ref Rng & Units 12/18/2015  HCV Quantitative <15 IU/mL [id number])  HCV Quantitative Log <1.18 log 10 6.22(H)    AST (U/L)  Date Value  01/20/2016 117 (H)  12/17/2015 79 (H)  07/25/2014 60 (H)   ALT (U/L)  Date Value  01/20/2016 112 (H)  12/17/2015 95 (H)  07/25/2014 61 (H)   INR (no units)  Date Value  01/20/2016 1.0    CrCl: CrCl cannot be calculated (Patient's most recent lab result is older than the maximum 21 days allowed.).  Fibrosis Score: F2/F3 as assessed by elastography   Child-Pugh Score: A  Previous Treatment Regimen: None  Assessment: Brent Mccormick is here today  for follow-up of his Hep C. He started Harvoni around a month ago, he has 3-4 pills left of his first month.  He is a very pleasant man and states he has missed no doses and takes it every morning around 8-9am. He gets it filled at [facility] and will pick up his 2nd month today. He had several questions for me today.  We discussed different ways to become infected with Hep C, his F score, different Hep C medications on the market and cure rates/percentages.  His wife is a Therapist, sports at [employer] in L&D. He is not having any side effects - no diarrhea, no nausea, no headaches or fatigue.  He does say he gets heartburn sometimes.  We discussed foods to avoid and advised him to take a teaspoon of yellow mustard to help with his symptoms.  He loves mustard, so he said that will work for him. He said his PCP screened him for Hep C after he saw his LFTs elevated.  We will get a viral load today as it is close to the end of 1 month.  I will make all his f/u visits and will also check another CMET at [facility] as  he is wondering about his LFTs.   Plans: - Continue Harvoni x 12 weeks - Hep C VL today - [facility] visit with me 4/25 at [place] labs 7/25 at [place] visit 1 week later 7/31 at [facility], PharmD [facility] [place] for Infectious Disease 03/02/2016, 10:39 AM

## 2016-03-07 ENCOUNTER — Telehealth: Payer: Self-pay | Admitting: Pharmacist

## 2016-03-09 LAB — HEPATITIS C RNA QUANTITATIVE
HCV Quantitative Log: <1.18 Log IU/mL — AB
HCV Quantitative: <15 IU/mL — AB

## 2016-03-10 NOTE — Telephone Encounter (Signed)
Called Ronalee Belts to tell him his viral load is already undetectable after 1 month of Harvoni.  Encouraged him to continue doing what he is doing and call me if he has any issues before his [facility] visit in April.

## 2016-03-14 MED FILL — LOSARTAN POTASSIUM 50 MG TA: 50 | 90 days supply | Qty: 90 | Fill #1

## 2016-03-31 ENCOUNTER — Ambulatory Visit (INDEPENDENT_AMBULATORY_CARE_PROVIDER_SITE_OTHER): Payer: 59 | Admitting: Adult Health

## 2016-03-31 ENCOUNTER — Encounter: Payer: Self-pay | Admitting: Adult Health

## 2016-03-31 VITALS — BP 142/90 | Temp 98.0°F | Wt 167.6 lb

## 2016-03-31 DIAGNOSIS — L03116 Cellulitis of left lower limb: Secondary | ICD-10-CM | POA: Diagnosis not present

## 2016-03-31 MED ORDER — CEPHALEXIN 500 MG PO CAPS: 500.0000 mg | ORAL_CAPSULE | Freq: Two times a day (BID) | ORAL | 0 refills | Status: DC

## 2016-03-31 MED FILL — CEPHALEXIN 500 MG CAPSULE: 500 | 10 days supply | Qty: 20 | Fill #0

## 2016-03-31 NOTE — Progress Notes (Signed)
Subjective:    Patient ID: Brent Aloe Sr., male    DOB: 05-Jun-1950, 66 y.o.   MRN: [id number]  HPI  66 year old male who  has a past medical history of Carpal tunnel syndrome; Gout; and Hypertension. He presents to the office today for 5 days of left foot swelling. He reports that since last Sunday his foot has become swollen, red, warm, and painful to touch and bearing weight.   He has a history of gout. Reports that it does not feel exactly like a gout flare and he usually gets gout in his great toes.   He denies any trauma to the area.   Denies any calf tenderness.   He took Indomethacin two days ago and noticed some improvement but when he took it today he did not have any improvement in his symptoms   He denies any SOB or CP   Review of Systems Negative unless mentioned in HPI   Past Medical History:  Diagnosis Date  . Carpal tunnel syndrome   . Gout   . Hypertension     Social History   Social History  . Marital status: Married    Spouse name: N/A  . Number of children: N/A  . Years of education: N/A   Occupational History  . Not on file.   Social History Main Topics  . Smoking status: Former Smoker    Quit date: 01/17/1966  . Smokeless tobacco: Never Used  . Alcohol use Yes     Comment: occasional beer  . Drug use: No  . Sexual activity: Yes   Other Topics Concern  . 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    Married    Two children, live locally.       Three dogs and two cats     Past Surgical History:  Procedure Laterality Date  . no prior surgery      Family History  Problem Relation Age of Onset  . Ovarian cancer Mother   . Heart disease Father   . Hypertension Father   . Colon cancer Neg Hx     No Known Allergies  Current Outpatient Prescriptions on File Prior to Visit  Medication Sig Dispense Refill  . aspirin 81 MG tablet Take 81 mg by  mouth daily.    . indomethacin (INDOCIN) 25 MG capsule Take 1 capsule (25 mg total) by mouth 2 (two) times daily with a meal. 30 capsule 2  . Ledipasvir-Sofosbuvir (HARVONI) 90-400 MG TABS Take 1 tablet by mouth daily. 28 tablet 2  . losartan (COZAAR) 50 MG tablet Take 1 tablet (50 mg total) by mouth daily. 90 tablet 3  . Multiple Vitamin (MULTIVITAMIN) tablet Take 1 tablet by mouth daily.    . TURMERIC PO Take 1 capsule by mouth daily.     No current facility-administered medications on file prior to visit.     BP (!) 142/90 (BP Location: Left Arm, Patient Position: Sitting, Cuff Size: Normal)   Temp 98 F (36.7 C) (Oral)   Wt 167 lb 9.6 oz (76 kg)   BMI 24.05 kg/m       Objective:   Physical Exam  Constitutional: He is oriented to person, place, and time. He appears well-developed and well-nourished. No distress.  Cardiovascular: Normal rate, regular rhythm, normal heart sounds and intact distal pulses.  Exam reveals no gallop and no friction  rub.   No murmur heard. Pulmonary/Chest: Effort normal and breath sounds normal. No respiratory distress. He has no wheezes. He has no rales. He exhibits no tenderness.  Musculoskeletal: Normal range of motion. He exhibits edema (left foot ) and tenderness (left foot). He exhibits no deformity.  Neurological: He is alert and oriented to person, place, and time.  Skin: Skin is warm and dry. No rash noted. He is not diaphoretic. There is erythema. No pallor.  Psychiatric: He has a normal mood and affect. His behavior is normal. Judgment and thought content normal.  Nursing note and vitals reviewed.     Assessment & Plan:  1. Cellulitis of left lower extremity - Cellulitis vs gout flare vs DVT. Appears to be more cellulitic in nature. Will start on Keflex. Advised to take Indomethacin as directed. If no improvement in 48 hours then please go to ER. Also advised to go to the ER with SOB or CP  - cephALEXin (KEFLEX) 500 MG capsule; Take 1 capsule  (500 mg total) by mouth 2 (two) times daily.  Dispense: 20 capsule; Refill: 0   Dorothyann Peng, NP

## 2016-04-05 ENCOUNTER — Encounter: Payer: Self-pay | Admitting: Family Medicine

## 2016-04-05 ENCOUNTER — Telehealth: Payer: Self-pay | Admitting: Adult Health

## 2016-04-05 ENCOUNTER — Ambulatory Visit: Payer: 59 | Admitting: Adult Health

## 2016-04-05 ENCOUNTER — Ambulatory Visit (INDEPENDENT_AMBULATORY_CARE_PROVIDER_SITE_OTHER): Payer: 59 | Admitting: Family Medicine

## 2016-04-05 VITALS — BP 138/84 | HR 81 | Temp 97.8°F | Ht 70.0 in | Wt 163.1 lb

## 2016-04-05 DIAGNOSIS — M109 Gout, unspecified: Secondary | ICD-10-CM

## 2016-04-05 DIAGNOSIS — M7989 Other specified soft tissue disorders: Secondary | ICD-10-CM | POA: Diagnosis not present

## 2016-04-05 DIAGNOSIS — M79672 Pain in left foot: Secondary | ICD-10-CM | POA: Diagnosis not present

## 2016-04-05 MED ORDER — CEPHALEXIN 500 MG PO CAPS: 500.0000 mg | ORAL_CAPSULE | Freq: Two times a day (BID) | ORAL | 0 refills | Status: DC

## 2016-04-05 MED ORDER — SULFAMETHOXAZOLE-TRIMETHOPRIM 800-160 MG PO TABS: 1.0000 | ORAL_TABLET | Freq: Two times a day (BID) | ORAL | 0 refills | Status: DC

## 2016-04-05 MED FILL — SULFAMETHOXAZOLE/TMP DS TAB: 800-160 | 5 days supply | Qty: 10 | Fill #0

## 2016-04-05 NOTE — Telephone Encounter (Signed)
error 

## 2016-04-05 NOTE — Patient Instructions (Signed)
BEFORE YOU LEAVE: -follow up: with Cory in 2 days -labs -xray sheet  Take the keflex and bactrim as prescribed.  Indomethacin as needed 1-2 times daily.  See care at orthopedic urgent care or Emergency room if worsening in the interim.

## 2016-04-05 NOTE — Progress Notes (Signed)
Pre visit review using our clinic review tool, if applicable. No additional management support is needed unless otherwise documented below in the visit note. 

## 2016-04-05 NOTE — Progress Notes (Signed)
HPI:  Acute visit for L toe/foot pain: -seen by PCP last week and tx for possible cellulitis vs gout with keflex and indomethacin -hx gout -he and wife think this is infection and that abx not strong enough -pt does not think is any better  -also he saw what he thought was a small spinter in distal dorsal toe so lanced it at home with sterile razor - reports this was a new finding in last few days -denies spreading redness, fevers, malaise, pus, inability to move toe or foot, SOB  ROS: See pertinent positives and negatives per HPI.  Past Medical History:  Diagnosis Date  . Carpal tunnel syndrome   . Gout   . Hypertension     Past Surgical History:  Procedure Laterality Date  . no prior surgery      Family History  Problem Relation Age of Onset  . Ovarian cancer Mother   . Heart disease Father   . Hypertension Father   . Colon cancer Neg Hx     Social History   Social History  . Marital status: Married    Spouse name: N/A  . Number of children: N/A  . Years of education: N/A   Social History Main Topics  . Smoking status: Former Smoker    Quit date: 01/17/1966  . Smokeless tobacco: Never Used  . Alcohol use Yes     Comment: occasional beer  . Drug use: No  . Sexual activity: Yes   Other Topics Concern  . 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    Married    Two children, live locally.       Three dogs and two cats      Current Outpatient Prescriptions:  .  aspirin 81 MG tablet, Take 81 mg by mouth daily., Disp: , Rfl:  .  indomethacin (INDOCIN) 25 MG capsule, Take 1 capsule (25 mg total) by mouth 2 (two) times daily with a meal., Disp: 30 capsule, Rfl: 2 .  Ledipasvir-Sofosbuvir (HARVONI) 90-400 MG TABS, Take 1 tablet by mouth daily., Disp: 28 tablet, Rfl: 2 .  losartan (COZAAR) 50 MG tablet, Take 1 tablet (50 mg total) by mouth daily., Disp: 90 tablet, Rfl: 3 .  Multiple  Vitamin (MULTIVITAMIN) tablet, Take 1 tablet by mouth daily., Disp: , Rfl:  .  TURMERIC PO, Take 1 capsule by mouth daily., Disp: , Rfl:  .  cephALEXin (KEFLEX) 500 MG capsule, Take 1 capsule (500 mg total) by mouth 2 (two) times daily., Disp: 10 capsule, Rfl: 0 .  sulfamethoxazole-trimethoprim (BACTRIM DS,SEPTRA DS) 800-160 MG tablet, Take 1 tablet by mouth 2 (two) times daily., Disp: 10 tablet, Rfl: 0  EXAM:  Vitals:   04/05/16 1623  BP: 138/84  Pulse: 81  Temp: 97.8 F (36.6 C)    Body mass index is 23.4 kg/m.  GENERAL: vitals reviewed and listed above, alert, oriented, appears well hydrated and in no acute distress  HEENT: atraumatic, conjunttiva clear, no obvious abnormalities on inspection of external nose and ears  NECK: no obvious masses on inspection  LUNGS: clear to auscultation bilaterally, no wheezes, rales or rhonchi, good air movement  CV: HRRR, no peripheral edema  MS: moves all extremities without noticeable abnormality, mild erythema/edema/warmth R 1st MTP joint with TTP in this area and some erythema/edema R great toe, cap refill normal, able to move great toes  SKIN: very small clean  wound R great toe without any signs of foreign body or infection here after removal dried blood  PSYCH: pleasant and cooperative, no obvious depression or anxiety  ASSESSMENT AND PLAN:  Discussed the following assessment and plan:  Left foot pain - Plan: DG Foot Complete Left  Foot swelling - Plan: CBC, Uric Acid  Acute gout involving toe of left foot, unspecified cause  -we discussed possible serious and likely etiologies, workup and treatment, treatment risks and return precautions - likely gout, but possible cellulitis vs less likely other -pt reports not improving, but per review PCP notes looks better, PCP took a peak and agreed appears better -after this discussion, Deaundre opted for xray, labs per orders, bactrim/keflex and nsaids, close follow up with PCP in 2  days -follow up advised with ortho urgent care or ER if worsening in interim  Patient Instructions  BEFORE YOU LEAVE: -follow up: with Tommi Rumps in 2 days -labs -xray sheet  Take the keflex and bactrim as prescribed.  Indomethacin as needed 1-2 times daily.  See care at orthopedic urgent care or Emergency room if worsening in the interim.    Colin Benton R., DO

## 2016-04-06 ENCOUNTER — Ambulatory Visit (INDEPENDENT_AMBULATORY_CARE_PROVIDER_SITE_OTHER)
Admission: RE | Admit: 2016-04-06 | Discharge: 2016-04-06 | Disposition: A | Discharge status: Home / Self Care | Payer: 59 | Source: Ambulatory Visit | Attending: Family Medicine | Admitting: Family Medicine

## 2016-04-06 ENCOUNTER — Telehealth: Payer: Self-pay | Admitting: Pharmacist

## 2016-04-06 DIAGNOSIS — M79672 Pain in left foot: Secondary | ICD-10-CM

## 2016-04-06 DIAGNOSIS — M19072 Primary osteoarthritis, left ankle and foot: Secondary | ICD-10-CM | POA: Diagnosis not present

## 2016-04-06 LAB — CBC
HCT: 46.3 % (ref 39.0–52.0)
Hemoglobin: 15.9 g/dL (ref 13.0–17.0)
MCHC: 34.4 g/dL (ref 30.0–36.0)
MCV: 87.9 fl (ref 78.0–100.0)
Platelets: 353 10*3/uL (ref 150.0–400.0)
RBC: 5.27 Mil/uL (ref 4.22–5.81)
RDW: 12.4 % (ref 11.5–15.5)
WBC: 8 10*3/uL (ref 4.0–10.5)

## 2016-04-06 LAB — URIC ACID: Uric Acid, Serum: 8 mg/dL — ABNORMAL HIGH (ref 4.0–7.8)

## 2016-04-06 NOTE — Telephone Encounter (Signed)
Brent Mccormick called me today to tell me he had an infection in his foot and they put him on indomethacin, bactrim, and keflex and he was worried about taking them with his F2 fibrosis score and history of Hep C. I told him it was completely fine to take those three things together and it wouldn't affect his liver or be contraindicated.

## 2016-04-07 ENCOUNTER — Ambulatory Visit (INDEPENDENT_AMBULATORY_CARE_PROVIDER_SITE_OTHER): Payer: 59 | Admitting: Adult Health

## 2016-04-07 ENCOUNTER — Encounter: Payer: Self-pay | Admitting: Adult Health

## 2016-04-07 VITALS — BP 160/84 | Temp 97.9°F | Wt 163.4 lb

## 2016-04-07 DIAGNOSIS — M109 Gout, unspecified: Secondary | ICD-10-CM

## 2016-04-07 MED ORDER — METHYLPREDNISOLONE ACETATE 80 MG/ML IJ SUSP
80.0000 mg | Freq: Once | INTRAMUSCULAR | Status: AC
  Administered 2016-04-07: 80 mg via INTRAMUSCULAR

## 2016-04-07 NOTE — Progress Notes (Signed)
Subjective:    Patient ID: Brent Aloe Sr., male    DOB: January 19, 1950, 66 y.o.   MRN: [id number]  HPI  66 year old male who presents to the office today for follow up of left foot pain. I had originally seen him on 03/31/2016 for this issue and started him on Keflex and Idomethacin for cellulitis vs acute gout flare.   He saw Dr. Maudie Mercury two days ago for continued left foot pain. She added bactrim, got a x ray of the left foot and ordered CBC and uric acid level. When Dr. Maudie Mercury was seeing him, I took a look at his foot and it appeared as though it has improved  X ray of foot showed: Degenerative changes without acute abnormality.  Uric acid was slightly elevated at 8.0  CBC WNL   Today in the office he reports that he feels as though he is able to walk without much pain today. The swelling has resolved. He continues to have a little redness    Review of Systems See HPI   Past Medical History:  Diagnosis Date  . Carpal tunnel syndrome   . Gout   . Hypertension     Social History   Social History  . Marital status: Married    Spouse name: N/A  . Number of children: N/A  . Years of education: N/A   Occupational History  . Not on file.   Social History Main Topics  . Smoking status: Former Smoker    Quit date: 01/17/1966  . Smokeless tobacco: Never Used  . Alcohol use Yes     Comment: occasional beer  . Drug use: No  . Sexual activity: Yes   Other Topics Concern  . 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    Married    Two children, live locally.       Three dogs and two cats     Past Surgical History:  Procedure Laterality Date  . no prior surgery      Family History  Problem Relation Age of Onset  . Ovarian cancer Mother   . Heart disease Father   . Hypertension Father   . Colon cancer Neg Hx     No Known Allergies  Current Outpatient Prescriptions on File Prior to  Visit  Medication Sig Dispense Refill  . aspirin 81 MG tablet Take 81 mg by mouth daily.    . cephALEXin (KEFLEX) 500 MG capsule Take 1 capsule (500 mg total) by mouth 2 (two) times daily. 10 capsule 0  . indomethacin (INDOCIN) 25 MG capsule Take 1 capsule (25 mg total) by mouth 2 (two) times daily with a meal. 30 capsule 2  . Ledipasvir-Sofosbuvir (HARVONI) 90-400 MG TABS Take 1 tablet by mouth daily. 28 tablet 2  . losartan (COZAAR) 50 MG tablet Take 1 tablet (50 mg total) by mouth daily. 90 tablet 3  . Multiple Vitamin (MULTIVITAMIN) tablet Take 1 tablet by mouth daily.    Marland Kitchen sulfamethoxazole-trimethoprim (BACTRIM DS,SEPTRA DS) 800-160 MG tablet Take 1 tablet by mouth 2 (two) times daily. 10 tablet 0  . TURMERIC PO Take 1 capsule by mouth daily.     No current facility-administered medications on file prior to visit.     BP (!) 160/84 (BP Location: Left Arm, Patient Position: Sitting, Cuff Size: Normal)   Temp 97.9 F (36.6 C) (Oral)   Wt  163 lb 6.4 oz (74.1 kg)   BMI 23.45 kg/m       Objective:   Physical Exam  Constitutional: He is oriented to person, place, and time. He appears well-developed and well-nourished. No distress.  Cardiovascular: Normal rate, regular rhythm, normal heart sounds and intact distal pulses.  Exam reveals no gallop and no friction rub.   No murmur heard. Pulmonary/Chest: Effort normal and breath sounds normal. No respiratory distress. He has no wheezes. He has no rales. He exhibits no tenderness.  Musculoskeletal:  moves all extremities without noticeable abnormality, mild erythema R 1st MTP joint with TTP in this area and some erythem R great toe, cap refill normal, able to move great toes  Neurological: He is alert and oriented to person, place, and time.  Skin: Skin is warm and dry. No rash noted. He is not diaphoretic. No erythema. No pallor.  Psychiatric: He has a normal mood and affect. His behavior is normal. Judgment and thought content normal.    Nursing note and vitals reviewed.     Assessment & Plan:  1. Acute gout involving toe of left foot, unspecified cause - I am going to treat as gout at this time due to uric acid level. He appears to be improving but will give prednisone shot to help speed recovery - methylPREDNISolone acetate (DEPO-MEDROL) injection 80 mg; Inject 1 mL (80 mg total) into the muscle once. - Can d/c antibiotics - Follow up if no improvement  Dorothyann Peng, NP

## 2016-04-13 ENCOUNTER — Telehealth: Payer: Self-pay | Admitting: Adult Health

## 2016-04-13 NOTE — Telephone Encounter (Signed)
Spoke to Brent Mccormick and he informed me that his foot is feeling better, continues to be sore but is improving daily

## 2016-04-27 ENCOUNTER — Telehealth: Payer: Self-pay | Admitting: Adult Health

## 2016-04-27 MED ORDER — METHYLPREDNISOLONE 4 MG PO TBPK: ORAL_TABLET | ORAL | 0 refills | Status: DC

## 2016-04-27 MED FILL — METHYLPREDNISOLONE 4 MG TAB: 4 | 6 days supply | Qty: 21 | Fill #0

## 2016-04-27 NOTE — Telephone Encounter (Signed)
Is there anything you can advise for patient?

## 2016-04-27 NOTE — Telephone Encounter (Signed)
Pt was seen on 3-22 and problem has recur gout. Pt does not have time to come back in. Please call pt

## 2016-04-27 NOTE — Telephone Encounter (Signed)
Spoke to Brent Mccormick and he reports that he is having an acute gout flare. He has been taking his prescribed Indomethacin and the pain has become slightly better. He continues to have significant pain though.   I will have him continue with Indomethacin and send in a medrol dose pack

## 2016-05-11 ENCOUNTER — Ambulatory Visit: Payer: 59

## 2016-05-25 MED FILL — INDOMETHACIN 25 MG CAPSULE: 25 | 15 days supply | Qty: 30 | Fill #1

## 2016-06-09 ENCOUNTER — Encounter: Payer: Self-pay | Admitting: Adult Health

## 2016-06-09 ENCOUNTER — Ambulatory Visit (INDEPENDENT_AMBULATORY_CARE_PROVIDER_SITE_OTHER): Payer: 59 | Admitting: Adult Health

## 2016-06-09 VITALS — BP 124/72 | Temp 97.9°F | Ht 70.0 in | Wt 165.5 lb

## 2016-06-09 DIAGNOSIS — M109 Gout, unspecified: Secondary | ICD-10-CM

## 2016-06-09 MED ORDER — METHYLPREDNISOLONE 4 MG PO TBPK: ORAL_TABLET | ORAL | 0 refills | Status: DC

## 2016-06-09 MED ORDER — ALLOPURINOL 100 MG PO TABS: 100.0000 mg | ORAL_TABLET | Freq: Every day | ORAL | 3 refills | Status: DC

## 2016-06-09 MED FILL — ALLOPURINOL 100 MG TABLET: 100 | 90 days supply | Qty: 90 | Fill #0

## 2016-06-09 MED FILL — METHYLPREDNISOLONE 4 MG TAB: 4 | 6 days supply | Qty: 21 | Fill #0

## 2016-06-09 NOTE — Progress Notes (Signed)
Subjective:    Patient ID: Brent Aloe Sr., male    DOB: [birth year], 66 y.o.   MRN: [id number]  HPI  66 year old male who presents to the office for an acute gout flare in his right great toe and right second toe. This has been present for 3 days and he feels as though the pain is getting worse. Feels like his typical gout flare.   Reports redness, warmth, and pain in right great toe and right second toe.   This has been his second flare in 2 months and has had multiple over the last year.   He responds well prednisone.    Review of Systems See HPI   Past Medical History:  Diagnosis Date  . Carpal tunnel syndrome   . Gout   . Hypertension     Social History   Social History  . Marital status: Married    Spouse name: N/A  . Number of children: N/A  . Years of education: N/A   Occupational History  . Not on file.   Social History Main Topics  . Smoking status: Former Smoker    Quit date: 01/17/1966  . Smokeless tobacco: Never Used  . Alcohol use Yes     Comment: occasional beer  . Drug use: No  . Sexual activity: Yes   Other Topics Concern  . 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    Married    Two children, live locally.       Three dogs and two cats     Past Surgical History:  Procedure Laterality Date  . no prior surgery      Family History  Problem Relation Age of Onset  . Ovarian cancer Mother   . Heart disease Father   . Hypertension Father   . Colon cancer Neg Hx     No Known Allergies  Current Outpatient Prescriptions on File Prior to Visit  Medication Sig Dispense Refill  . aspirin 81 MG tablet Take 81 mg by mouth daily.    . indomethacin (INDOCIN) 25 MG capsule Take 1 capsule (25 mg total) by mouth 2 (two) times daily with a meal. 30 capsule 2  . losartan (COZAAR) 50 MG tablet Take 1 tablet (50 mg total) by mouth daily. 90 tablet 3  . Multiple  Vitamin (MULTIVITAMIN) tablet Take 1 tablet by mouth daily.    . TURMERIC PO Take 1 capsule by mouth daily.     No current facility-administered medications on file prior to visit.     BP 124/72 (BP Location: Left Arm, Patient Position: Sitting, Cuff Size: Normal)   Temp 97.9 F (36.6 C) (Oral)   Ht 5\' 10"  (1.778 m)   Wt 165 lb 8 oz (75.1 kg)   BMI 23.75 kg/m       Objective:   Physical Exam  Constitutional: He is oriented to person, place, and time. He appears well-developed and well-nourished. No distress.  Neurological: He is alert and oriented to person, place, and time.  Skin: Skin is warm. He is not diaphoretic. There is erythema.  Redness, warmth and tenderness with movement and palpation to right great toe and second toe.   Psychiatric: He has a normal mood and affect. His behavior is normal. Judgment and thought content normal.  Nursing note and vitals reviewed.     Assessment & Plan:  1. Acute gout  involving toe of right foot, unspecified cause - Will prescribe Allopurinol since this has been a chronic issue over the year.  - Advised to follow a diet designed for gout - allopurinol (ZYLOPRIM) 100 MG tablet; Take 1 tablet (100 mg total) by mouth daily.  Dispense: 90 tablet; Refill: 3 - methylPREDNISolone (MEDROL DOSEPAK) 4 MG TBPK tablet; Take as directed  Dispense: 21 tablet; Refill: 0  Dorothyann Peng, NP

## 2016-06-15 MED FILL — LOSARTAN POTASSIUM 50 MG TA: 50 | 90 days supply | Qty: 90 | Fill #2

## 2016-06-17 ENCOUNTER — Telehealth: Payer: Self-pay | Admitting: Adult Health

## 2016-06-17 ENCOUNTER — Other Ambulatory Visit: Payer: Self-pay | Admitting: Adult Health

## 2016-06-17 DIAGNOSIS — M79674 Pain in right toe(s): Secondary | ICD-10-CM

## 2016-06-17 NOTE — Telephone Encounter (Signed)
I would like to refer him to podiatry

## 2016-06-17 NOTE — Telephone Encounter (Signed)
Pt states he was to let Tommi Rumps know how his foot is doing.  his right foot is a little better, but not doing as well as he thinks it should.  Big toe joint is still swollen. Difficult to walk on and painful.  [place], [place] - [address].

## 2016-06-17 NOTE — Telephone Encounter (Signed)
Referral has been placed. I left message for patient to return phone call.

## 2016-06-17 NOTE — Telephone Encounter (Signed)
I left message for patient notifying him that referral has been placed. I advised patient to call back with any questions or concerns,

## 2016-06-17 NOTE — Telephone Encounter (Signed)
See below

## 2016-06-21 MED FILL — INDOMETHACIN 25 MG CAPSULE: 25 | 15 days supply | Qty: 30 | Fill #2

## 2016-06-28 ENCOUNTER — Encounter: Payer: Self-pay | Admitting: Sports Medicine

## 2016-06-28 ENCOUNTER — Ambulatory Visit (INDEPENDENT_AMBULATORY_CARE_PROVIDER_SITE_OTHER): Payer: 59 | Admitting: Sports Medicine

## 2016-06-28 ENCOUNTER — Ambulatory Visit (INDEPENDENT_AMBULATORY_CARE_PROVIDER_SITE_OTHER): Payer: 59

## 2016-06-28 DIAGNOSIS — M79671 Pain in right foot: Secondary | ICD-10-CM

## 2016-06-28 DIAGNOSIS — M779 Enthesopathy, unspecified: Secondary | ICD-10-CM

## 2016-06-28 DIAGNOSIS — M7750 Other enthesopathy of unspecified foot: Secondary | ICD-10-CM

## 2016-06-28 DIAGNOSIS — M2021 Hallux rigidus, right foot: Secondary | ICD-10-CM

## 2016-06-28 DIAGNOSIS — Z8739 Personal history of other diseases of the musculoskeletal system and connective tissue: Secondary | ICD-10-CM

## 2016-06-28 MED ORDER — TRIAMCINOLONE ACETONIDE 10 MG/ML IJ SUSP: 10.0000 mg | Freq: Once | INTRAMUSCULAR | Status: DC

## 2016-06-28 NOTE — Progress Notes (Signed)
Subjective: Brent Daywalt Sr. is a 66 y.o. male patient who presents to office for evaluation of right foot pain. Patient complains of progressive pain especially over the last month in the right foot at the big toe joint. Admits to a history of gout with last flare up in 2nd toe and was given Allopurinol, indomethacin, and medrol dosepak of which he did not take because he wanted to wait and have his foot checked 1st. Patient reports that his uric acid was elevated as well. Patient denies any other pedal complaints.  Patient Active Problem List   Diagnosis Date Noted  . Chronic hepatitis C without hepatic coma ([place]) 01/20/2016  . Lumbar disc disease with radiculopathy 03/07/2012  . Right wrist sprain 05/27/2011  . Essential hypertension 12/14/2009  . RHINITIS 03/26/2008  . Gout 01/15/2007  . ERECTILE DYSFUNCTION 01/15/2007  . CARPAL TUNNEL SYNDROME, RIGHT 01/15/2007    Current Outpatient Prescriptions on File Prior to Visit  Medication Sig Dispense Refill  . allopurinol (ZYLOPRIM) 100 MG tablet Take 1 tablet (100 mg total) by mouth daily. 90 tablet 3  . aspirin 81 MG tablet Take 81 mg by mouth daily.    . indomethacin (INDOCIN) 25 MG capsule Take 1 capsule (25 mg total) by mouth 2 (two) times daily with a meal. 30 capsule 2  . losartan (COZAAR) 50 MG tablet Take 1 tablet (50 mg total) by mouth daily. 90 tablet 3  . methylPREDNISolone (MEDROL DOSEPAK) 4 MG TBPK tablet Take as directed 21 tablet 0  . Multiple Vitamin (MULTIVITAMIN) tablet Take 1 tablet by mouth daily.    . TURMERIC PO Take 1 capsule by mouth daily.     No current facility-administered medications on file prior to visit.     No Known Allergies  Objective:  General: Alert and oriented x3 in no acute distress  Dermatology: No open lesions bilateral lower extremities, no webspace macerations, no ecchymosis bilateral, all nails x 10 are well manicured.  Vascular: Dorsalis Pedis and Posterior Tibial pedal pulses palpable,  Capillary Fill Time 3 seconds,(+) pedal hair growth bilateral, no edema bilateral lower extremities, Temperature gradient within normal limits.  Neurology: Johney Maine sensation intact via light touch bilateral. (- )Tinels sign bilateral.   Musculoskeletal: Mild tenderness with palpation at right 1st MTPJ with pain at end range of motion, + hammertoe with distal IPJ fullness with history of gout at 2nd toe on right. Strength within normal limits in all groups bilateral.   Xrays  RIght Foot   Impression: joint space narrowing with increased swelling at 1st MTPJ with lesser hammertoe with no other acute findings.   Assessment and Plan: Problem List Items Addressed This Visit    None    Visit Diagnoses    Capsulitis    -  Primary   Relevant Medications   triamcinolone acetonide (KENALOG) 10 MG/ML injection 10 mg   Other Relevant Orders   DG Foot Complete Right (Completed)   Hallux rigidus of right foot       Relevant Medications   triamcinolone acetonide (KENALOG) 10 MG/ML injection 10 mg   Right foot pain       Relevant Medications   triamcinolone acetonide (KENALOG) 10 MG/ML injection 10 mg   History of gout           -Complete examination performed -Xrays reviewed -Discussed treatement options for arthritis of big toe joint and gout -After oral consent and aseptic prep, injected a mixture containing 1 ml of 2%  plain lidocaine, 1  ml 0.5% plain marcaine, 0.5 ml of kenalog 10 and 0.5 ml of dexamethasone phosphate into right 1st MTPJ without complication. Post-injection care discussed with patient.  -Recommend patient to take his gout meds as Rx -Recommend good supportive shoes -Patient to return to office 5 weeks or sooner if condition worsens. If no improvement may benefit from surgery.   Landis Martins, DPM

## 2016-07-04 ENCOUNTER — Other Ambulatory Visit: Payer: 59

## 2016-07-04 DIAGNOSIS — B182 Chronic viral hepatitis C: Secondary | ICD-10-CM | POA: Diagnosis not present

## 2016-07-05 LAB — COMPLETE METABOLIC PANEL WITH GFR
ALT: 10 U/L (ref 9–46)
AST: 13 U/L (ref 10–35)
Albumin: 4.2 g/dL (ref 3.6–5.1)
Alkaline Phosphatase: 50 U/L (ref 40–115)
BUN: 25 mg/dL (ref 7–25)
CO2: 21 mmol/L (ref 20–31)
Calcium: 9.5 mg/dL (ref 8.6–10.3)
Chloride: 105 mmol/L (ref 98–110)
Creat: 1.01 mg/dL (ref 0.70–1.25)
GFR, Est African American: 89 mL/min (ref >=60)
GFR, Est Non African American: 77 mL/min (ref >=60)
Glucose, Bld: 114 mg/dL — ABNORMAL HIGH (ref 65–99)
Potassium: 4.7 mmol/L (ref 3.5–5.3)
Sodium: 139 mmol/L (ref 135–146)
Total Bilirubin: 0.4 mg/dL (ref 0.2–1.2)
Total Protein: 7.4 g/dL (ref 6.1–8.1)

## 2016-07-07 LAB — HEPATITIS C RNA QUANTITATIVE
HCV Quantitative Log: <1.18 Log IU/mL
HCV Quantitative: <15 IU/mL

## 2016-07-08 ENCOUNTER — Other Ambulatory Visit: Payer: Self-pay | Admitting: Adult Health

## 2016-07-08 DIAGNOSIS — M109 Gout, unspecified: Secondary | ICD-10-CM

## 2016-07-08 MED FILL — INDOMETHACIN 25 MG CAPSULE: 25 | 15 days supply | Qty: 30 | Fill #0

## 2016-07-08 NOTE — Telephone Encounter (Signed)
He can have 30 pills

## 2016-07-08 NOTE — Telephone Encounter (Signed)
Ok to refill 

## 2016-07-08 NOTE — Telephone Encounter (Signed)
I advised patient that per [facility], he should not take this medication every day. Only time he should take medication is if he is having a gout flare up. Patient verbalized understanding. Thanks!

## 2016-07-11 ENCOUNTER — Ambulatory Visit (INDEPENDENT_AMBULATORY_CARE_PROVIDER_SITE_OTHER): Payer: 59 | Admitting: Pharmacist

## 2016-07-11 DIAGNOSIS — B182 Chronic viral hepatitis C: Secondary | ICD-10-CM | POA: Diagnosis not present

## 2016-07-11 NOTE — Progress Notes (Signed)
HPI: Brent Frett Sr. is a 66 y.o. male who presents to the [place] clinic for his Hep C cure visit.   Lab Results  Component Value Date   HCVGENOTYPE 1a 01/20/2016    Allergies: No Known Allergies  Past Medical History: Past Medical History:  Diagnosis Date  . Carpal tunnel syndrome   . Gout   . Hypertension     Social History: Social History   Social History  . Marital status: Married    Spouse name: N/A  . Number of children: N/A  . Years of education: N/A   Social History Main Topics  . Smoking status: Former Smoker    Quit date: 01/17/1966  . Smokeless tobacco: Never Used  . Alcohol use Yes     Comment: occasional beer  . Drug use: No  . Sexual activity: Yes   Other Topics Concern  . 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    Married    Two children, live locally.       Three dogs and two cats     Labs: Hep B S Ab (no units)  Date Value  01/20/2016 NEG   Hepatitis B Surface Ag (no units)  Date Value  01/20/2016 NEGATIVE   HCV Ab (no units)  Date Value  12/18/2015 REACTIVE (A)    Lab Results  Component Value Date   HCVGENOTYPE 1a 01/20/2016    Hepatitis C RNA quantitative Latest Ref Rng & Units 07/04/2016 03/02/2016 12/18/2015  HCV Quantitative NOT DETECTED IU/mL <15 NOT DETECTED <15 DETECTED(A) 4,944,967(R)  HCV Quantitative Log NOT DETECTED Log IU/mL <1.18 NOT DETECTED <1.18 DETECTED(A) 6.22(H)    AST (U/L)  Date Value  07/04/2016 13  01/20/2016 117 (H)  12/17/2015 79 (H)   ALT (U/L)  Date Value  07/04/2016 10  01/20/2016 112 (H)  12/17/2015 95 (H)   INR (no units)  Date Value  01/20/2016 1.0    CrCl: CrCl cannot be calculated (Unknown ideal weight.).  Fibrosis Score: F2/F3 as assessed by elastography   Child-Pugh Score: A  Previous Treatment Regimen: None  Assessment: Moksh finished 8 weeks of Harvoni (he only took 8  not 12 due to not being HIV co-infected, low baseline Hep C viral load, and no cirrhosis) back in March. He has F2/F3 fibrosis.  His SVR12 Hep C viral load was still undetectable, so he is cured. Congratulated him and told him to let us know if he needs anything in the future. He was very happy.    Plans: - Cured of Hep C - RTC PRN  Lissette Schenk L. Madysen Faircloth, PharmD, [facility] 07/11/2016, 4:06 PM

## 2016-08-02 ENCOUNTER — Ambulatory Visit (INDEPENDENT_AMBULATORY_CARE_PROVIDER_SITE_OTHER): Payer: 59 | Admitting: Sports Medicine

## 2016-08-02 DIAGNOSIS — M779 Enthesopathy, unspecified: Secondary | ICD-10-CM | POA: Diagnosis not present

## 2016-08-02 DIAGNOSIS — Z8739 Personal history of other diseases of the musculoskeletal system and connective tissue: Secondary | ICD-10-CM

## 2016-08-02 DIAGNOSIS — M79671 Pain in right foot: Secondary | ICD-10-CM

## 2016-08-02 DIAGNOSIS — M2021 Hallux rigidus, right foot: Secondary | ICD-10-CM

## 2016-08-02 MED ORDER — METHYLPREDNISOLONE 4 MG PO TBPK: ORAL_TABLET | ORAL | 0 refills | Status: DC

## 2016-08-02 MED FILL — METHYLPREDNISOLONE 4 MG TAB: 4 | 6 days supply | Qty: 21 | Fill #0

## 2016-08-02 NOTE — Progress Notes (Signed)
Subjective: Brent Plush Sr. is a 66 y.o. male patient who returns to office for evaluation of right foot pain. Patient states that the injection helped the big toe joint some however still has symptoms now on left big toe joint as well. Still on Gout medications and is wondering if he should change or stop them. No other issues.   Patient Active Problem List   Diagnosis Date Noted  . Chronic hepatitis C without hepatic coma ([place]) 01/20/2016  . Lumbar disc disease with radiculopathy 03/07/2012  . Right wrist sprain 05/27/2011  . Essential hypertension 12/14/2009  . RHINITIS 03/26/2008  . Gout 01/15/2007  . ERECTILE DYSFUNCTION 01/15/2007  . CARPAL TUNNEL SYNDROME, RIGHT 01/15/2007    Current Outpatient Prescriptions on File Prior to Visit  Medication Sig Dispense Refill  . allopurinol (ZYLOPRIM) 100 MG tablet Take 1 tablet (100 mg total) by mouth daily. 90 tablet 3  . aspirin 81 MG tablet Take 81 mg by mouth daily.    . indomethacin (INDOCIN) 25 MG capsule TAKE 1 CAPSULE BY MOUTH 2 TIMES DAILY WITH A MEAL. 30 capsule 0  . losartan (COZAAR) 50 MG tablet Take 1 tablet (50 mg total) by mouth daily. 90 tablet 3  . Multiple Vitamin (MULTIVITAMIN) tablet Take 1 tablet by mouth daily.    . TURMERIC PO Take 1 capsule by mouth daily.     Current Facility-Administered Medications on File Prior to Visit  Medication Dose Route Frequency Provider Last Rate Last Dose  . triamcinolone acetonide (KENALOG) 10 MG/ML injection 10 mg  10 mg Other Once Landis Martins, DPM        No Known Allergies  Objective:  General: Alert and oriented x3 in no acute distress  Dermatology: No open lesions bilateral lower extremities, no webspace macerations, no ecchymosis bilateral, all nails x 10 are well manicured.  Vascular: Dorsalis Pedis and Posterior Tibial pedal pulses palpable, Capillary Fill Time 3 seconds,(+) pedal hair growth bilateral, no edema bilateral lower extremities, Temperature gradient within  normal limits.  Neurology: Johney Maine sensation intact via light touch bilateral. (- )Tinels sign bilateral.   Musculoskeletal: Mild tenderness with palpation at right>left 1st MTPJ with pain at end range of motion, + hammertoe with distal IPJ fullness with history of gout at 2nd toe on right. Strength within normal limits in all groups bilateral.   Assessment and Plan: Problem List Items Addressed This Visit    None    Visit Diagnoses    Hallux rigidus of right foot    -  Primary   Relevant Medications   methylPREDNISolone (MEDROL DOSEPAK) 4 MG TBPK tablet   Capsulitis       Relevant Medications   methylPREDNISolone (MEDROL DOSEPAK) 4 MG TBPK tablet   Right foot pain       Relevant Medications   methylPREDNISolone (MEDROL DOSEPAK) 4 MG TBPK tablet   History of gout       Relevant Medications   methylPREDNISolone (MEDROL DOSEPAK) 4 MG TBPK tablet       -Complete examination performed -Re-Discussed treatement options for arthritis of big toe joint and gout -Patient declined repeat injection, Rx Medrol dose pack -Recommend patient to take his gout meds as Rx and to further discuss with PCP on stopping and changing medications -Recommend good supportive shoes and patient met with Liliane Channel and was casted for custom molded insoles for rigidus  -Patient to return to [facility] and advised patient in future may require surgery of [place].  Landis Martins, DPM

## 2016-08-10 ENCOUNTER — Other Ambulatory Visit: Payer: 59

## 2016-08-10 ENCOUNTER — Other Ambulatory Visit: Payer: Self-pay | Admitting: Adult Health

## 2016-08-10 DIAGNOSIS — M109 Gout, unspecified: Secondary | ICD-10-CM

## 2016-08-10 MED FILL — INDOMETHACIN 25 MG CAPSULE: 25 | 8 days supply | Qty: 15 | Fill #0

## 2016-08-10 NOTE — Telephone Encounter (Signed)
Please advise next steps

## 2016-08-10 NOTE — Telephone Encounter (Signed)
Spoke to the pt and scheduled him for 08/12/16 to see Dr. Elease Hashimoto.  Will forward to Dr. Elease Hashimoto to review.

## 2016-08-10 NOTE — Telephone Encounter (Signed)
Yes , pt states he is just beginning to have a flare up in the right toe.   Also pt states the Dr at [facility] suggest he needed to come back to our office and have a uric acid lab done to check on things.  Pt wants to know if he can come in and have lab done, or does he need an OV?

## 2016-08-10 NOTE — Telephone Encounter (Signed)
I would prefer an office visit with someone this week. We may need to go up on allopurinol   He can have 15 days of Indomethacin prescription

## 2016-08-10 NOTE — Telephone Encounter (Signed)
Left a message on home and cell for a return call.  Need to see if he is currently having a gout flare.

## 2016-08-12 ENCOUNTER — Encounter: Payer: Self-pay | Admitting: Family Medicine

## 2016-08-12 ENCOUNTER — Ambulatory Visit (INDEPENDENT_AMBULATORY_CARE_PROVIDER_SITE_OTHER): Payer: 59 | Admitting: Family Medicine

## 2016-08-12 VITALS — BP 140/90 | HR 74 | Temp 98.1°F | Wt 162.7 lb

## 2016-08-12 DIAGNOSIS — M109 Gout, unspecified: Secondary | ICD-10-CM | POA: Diagnosis not present

## 2016-08-12 MED ORDER — PREDNISONE 10 MG PO TABS: ORAL_TABLET | ORAL | 0 refills | Status: DC

## 2016-08-12 MED ORDER — COLCHICINE 0.6 MG PO TABS: 0.6000 mg | ORAL_TABLET | Freq: Every day | ORAL | 2 refills | Status: DC

## 2016-08-12 MED FILL — predniSONE 10 MG TABS: 10 | 10 days supply | Qty: 28 | Fill #0

## 2016-08-12 MED FILL — COLCHICINE 0.6 MG TABLET: 0.6 | 30 days supply | Qty: 30 | Fill #0

## 2016-08-12 NOTE — Progress Notes (Signed)
Subjective:     Patient ID: Brent Aloe Sr., male   DOB: 07-20-1950, 66 y.o.   MRN: [id number]  HPI Patient here to discuss foot pain and gout issues. He has history of gout with frequent involvement of the right metatarsophalangeal joint.  Has also had some recent issues with his right second toe and saw a podiatrist recently and had severe arthritic changes of the second toe. Has recurrent redness and warmth and pain of the metatarsophalangeal joint. Recent x-rays showed some degenerative changes but not severe at that joint. No erosions.  He had uric acid level back in March which was 8.0. He is on low-dose Allopurinol 100 mg daily. Has never been on higher doses. Recent 5 day prednisone per podiatry which helped but he had recurrence of pain and swelling after discontinuing the prednisone  Past Medical History:  Diagnosis Date  . Carpal tunnel syndrome   . Gout   . Hypertension    Past Surgical History:  Procedure Laterality Date  . no prior surgery      reports that he quit smoking about 50 years ago. He has never used smokeless tobacco. He reports that he drinks alcohol. He reports that he does not use drugs. family history includes Heart disease in his father; Hypertension in his father; Ovarian cancer in his mother. No Known Allergies   Review of Systems  Constitutional: Negative for chills and fever.       Objective:   Physical Exam  Constitutional: He appears well-developed and well-nourished.  Cardiovascular: Normal rate and regular rhythm.   Pulmonary/Chest: Effort normal and breath sounds normal. No respiratory distress. He has no wheezes. He has no rales.  Musculoskeletal:  Right foot reveals some mild warmth and mild erythema metatarsophalangeal joint. He has some deformity the distal right second toe with flexion contracture but nontender       Assessment:     Recurrent gout right foot    Plan:     -Discontinue low-dose Indocin -Prednisone taper starting  at 40 mg daily for the next 9 days -Start colchicine 0.6 mg 1 daily (and continue until target dose of Allopurinol reached). -In 2 weeks assuming acute flareup has subsided will start titration of Allopurinol to 200 mg daily for 1 week and then increase to 300 mg daily -Follow-up with primary in 6 weeks and recheck uric acid then with goal of less than 6.0  Eulas Post MD [facility]

## 2016-08-12 NOTE — Patient Instructions (Signed)
Start the Prednisone taper Stop the Indomethacin Start the colcrys one daily In about two weeks when gout no acutely inflamed go up to 200 mg daily for one week and then increase to 300 mg daily until follow up. We will plan to repeat uric acid at follow up.

## 2016-08-15 ENCOUNTER — Telehealth: Payer: Self-pay

## 2016-08-15 NOTE — Telephone Encounter (Signed)
Spoke with pt and he states that he did take some Benadryl and the hives improved. He took another dose of colchicine yesterday and had no reaction. He thinks it may have been taking all 4 tablets of prednisone at one time, as he has taken lower doses before with no problem. Advised pt to hold the prednisone and continue the colchicine. Call office if any further s/s of possible reaction. Nothing further needed at this time.  Dr. Elease Hashimoto - FYI. Thanks!

## 2016-08-15 NOTE — Telephone Encounter (Signed)
[facility] Patient Name: Brent Mccormick Gender: Male DOB: 14-May-1950 Age: 66 Y 9 M 12 D Return Phone Number: [phone] (Primary), [phone] (Secondary) City/State/Zip: [place] [address] Client [facility] Night - Client Client Site [facility] - Night Physician Carolann Littler - MD Who Is Calling Patient / Member / Family / Caregiver Call Type Triage / Clinical Caller Name [facility] Relationship To Patient Spouse Return Phone Number [phone] (Primary) Chief Complaint Medication reaction Reason for Call Symptomatic / Request for [name] states her husband was seen today and he took Colchicine and his steroids. Now he has Hives. Disp. Time Eilene Ghazi Time) Disposition Final User 08/12/2016 8:48:35 PM See Physician within 24 Hours Yes Weiss-H[place], RN, Ria Comment Referrals [facility]

## 2016-08-16 ENCOUNTER — Ambulatory Visit: Payer: 59

## 2016-08-30 ENCOUNTER — Ambulatory Visit: Payer: 59 | Admitting: Orthotics

## 2016-08-30 DIAGNOSIS — M2021 Hallux rigidus, right foot: Secondary | ICD-10-CM

## 2016-08-30 NOTE — Progress Notes (Signed)
Patient came in today to pick up custom made foot orthotics.  The goals were accomplished and the patient reported no dissatisfaction with said orthotics.  Patient was advised of breakin period and how to report any issues. 

## 2016-09-09 MED FILL — COLCHICINE 0.6 MG TABLET: 0.6 | 30 days supply | Qty: 30 | Fill #1

## 2016-09-09 MED FILL — LOSARTAN POTASSIUM 50 MG TA: 50 | 90 days supply | Qty: 90 | Fill #3

## 2016-09-23 ENCOUNTER — Ambulatory Visit: Payer: 59 | Admitting: Adult Health

## 2016-10-10 MED FILL — COLCHICINE 0.6 MG TABLET: 0.6 | 30 days supply | Qty: 30 | Fill #2

## 2016-11-08 ENCOUNTER — Other Ambulatory Visit: Payer: Self-pay | Admitting: Family Medicine

## 2016-11-08 NOTE — Telephone Encounter (Signed)
Sent to the pharmacy by e-scribe.  Pt due for yearly on 12/16/16.

## 2016-11-09 MED FILL — COLCHICINE 0.6 MG TABS: 0.6 | 30 days supply | Qty: 30 | Fill #0

## 2016-12-09 ENCOUNTER — Other Ambulatory Visit: Payer: Self-pay | Admitting: Adult Health

## 2016-12-09 MED FILL — LOSARTAN POTASSIUM 50 MG TA: 50 | 90 days supply | Qty: 90 | Fill #0

## 2016-12-13 MED FILL — COLCHICINE 0.6 MG TABS: 0.6 | 30 days supply | Qty: 30 | Fill #0

## 2016-12-13 NOTE — Telephone Encounter (Signed)
Sent in for 30 days.  Pt scheduled for cpx on 12/22/16 @ 8 AM.  Notified to come [place] for check in.

## 2016-12-22 ENCOUNTER — Ambulatory Visit: Payer: 59 | Admitting: Adult Health

## 2016-12-22 NOTE — Progress Notes (Deleted)
   Subjective:    Patient ID: Festus Aloe Sr., male    DOB: 31-Oct-1950, 66 y.o.   MRN: [id number]  HPI  Patient presents for yearly preventative medicine examination. He is a pleasant 66 year old male who  has a past medical history of Carpal tunnel syndrome, Gout, and Hypertension.  He takes Cozzar 50 mg for hypertension   He takes Allopurinol 100 mg for gout    All immunizations and health maintenance protocols were reviewed with the patient and needed orders were placed. He is due for influenza vaccination   Appropriate screening laboratory values were ordered for the patient including screening of hyperlipidemia, renal function and hepatic function. If indicated by BPH, a PSA was ordered.  Medication reconciliation,  past medical history, social history, problem list and allergies were reviewed in detail with the patient  Goals were established with regard to weight loss, exercise, and  diet in compliance with medications. He does not exercise on a regular basis. He does eat a heart healthy diet   End of life planning was discussed.  He is up to date on his colonoscopy ( next in 2020). He participates in routine dental and vision screens   Over the last year he has completed Harvoni treatment. He was deemed "cured" in June 2018.   Review of Systems  Constitutional: Negative.   HENT: Negative.   Eyes: Negative.   Respiratory: Negative.   Cardiovascular: Negative.   Gastrointestinal: Negative.   Endocrine: Negative.   Genitourinary: Negative.   Musculoskeletal: Negative.   Skin: Negative.   Allergic/Immunologic: Negative.   Neurological: Negative.   Hematological: Negative.   Psychiatric/Behavioral: Negative.   All other systems reviewed and are negative.      Objective:   Physical Exam  Constitutional: He is oriented to person, place, and time. He appears well-developed and well-nourished. No distress.  HENT:  Head: Normocephalic and atraumatic.  Right Ear:  External ear normal.  Left Ear: External ear normal.  Nose: Nose normal.  Mouth/Throat: Oropharynx is clear and moist. No oropharyngeal exudate.  Eyes: Conjunctivae and EOM are normal. Pupils are equal, round, and reactive to light. Right eye exhibits no discharge. Left eye exhibits no discharge. No scleral icterus.  Neck: Normal range of motion. Neck supple. No tracheal deviation present. No thyromegaly present.  Cardiovascular: Normal rate, regular rhythm, normal heart sounds and intact distal pulses. Exam reveals no gallop and no friction rub.  No murmur heard. Pulmonary/Chest: Effort normal and breath sounds normal. No respiratory distress. He has no wheezes. He has no rales. He exhibits no tenderness.  Abdominal: Soft. Bowel sounds are normal. He exhibits no distension and no mass. There is no tenderness. There is no rebound and no guarding.  Musculoskeletal: Normal range of motion. He exhibits no edema, tenderness or deformity.  Lymphadenopathy:    He has no cervical adenopathy.  Neurological: He is alert and oriented to person, place, and time. He has normal reflexes. He displays normal reflexes. No cranial nerve deficit. He exhibits normal muscle tone. Coordination normal.  Skin: Skin is warm and dry. No rash noted. He is not diaphoretic. No erythema. No pallor.  Psychiatric: He has a normal mood and affect. His behavior is normal. Judgment and thought content normal.  Nursing note and vitals reviewed.         Assessment & Plan:

## 2016-12-30 ENCOUNTER — Ambulatory Visit: Payer: 59 | Admitting: Adult Health

## 2017-01-11 ENCOUNTER — Other Ambulatory Visit: Payer: Self-pay | Admitting: Adult Health

## 2017-01-12 NOTE — Telephone Encounter (Signed)
Sent to the pharmacy by e-scribe.  Pt has upcoming cpx on 01/20/17.

## 2017-01-13 ENCOUNTER — Other Ambulatory Visit: Payer: Self-pay | Admitting: Adult Health

## 2017-01-13 MED FILL — COLCHICINE 0.6 MG TABS: 0.6 | 30 days supply | Qty: 30 | Fill #0

## 2017-01-13 NOTE — Telephone Encounter (Signed)
Duplicate Request.  Sent in on 01/12/17 by Tommi Rumps.  Message sent to the pharmacy to check file.

## 2017-01-20 ENCOUNTER — Encounter: Payer: Self-pay | Admitting: Family Medicine

## 2017-01-20 ENCOUNTER — Ambulatory Visit (INDEPENDENT_AMBULATORY_CARE_PROVIDER_SITE_OTHER): Payer: 59 | Admitting: Adult Health

## 2017-01-20 ENCOUNTER — Encounter: Payer: Self-pay | Admitting: Adult Health

## 2017-01-20 VITALS — BP 132/84 | HR 68 | Temp 97.8°F | Ht 71.0 in | Wt 170.8 lb

## 2017-01-20 DIAGNOSIS — I10 Essential (primary) hypertension: Secondary | ICD-10-CM

## 2017-01-20 DIAGNOSIS — Z0000 Encounter for general adult medical examination without abnormal findings: Secondary | ICD-10-CM | POA: Diagnosis not present

## 2017-01-20 DIAGNOSIS — Z125 Encounter for screening for malignant neoplasm of prostate: Secondary | ICD-10-CM

## 2017-01-20 LAB — BASIC METABOLIC PANEL
BUN: 21 mg/dL (ref 6–23)
CO2: 31 mEq/L (ref 19–32)
Calcium: 9.7 mg/dL (ref 8.4–10.5)
Chloride: 102 mEq/L (ref 96–112)
Creatinine, Ser: 0.93 mg/dL (ref 0.40–1.50)
GFR: 85.82 mL/min (ref >60.00)
Glucose, Bld: 93 mg/dL (ref 70–99)
Potassium: 4.1 mEq/L (ref 3.5–5.1)
Sodium: 140 mEq/L (ref 135–145)

## 2017-01-20 LAB — CBC WITH DIFFERENTIAL/PLATELET
Basophils Absolute: 0.1 10*3/uL (ref 0.0–0.1)
Basophils Relative: 0.9 % (ref 0.0–3.0)
Eosinophils Absolute: 0.3 10*3/uL (ref 0.0–0.7)
Eosinophils Relative: 3.7 % (ref 0.0–5.0)
HCT: 47.1 % (ref 39.0–52.0)
Hemoglobin: 15.9 g/dL (ref 13.0–17.0)
Lymphocytes Relative: 27.8 % (ref 12.0–46.0)
Lymphs Abs: 1.9 10*3/uL (ref 0.7–4.0)
MCHC: 33.7 g/dL (ref 30.0–36.0)
MCV: 90.4 fl (ref 78.0–100.0)
Monocytes Absolute: 0.5 10*3/uL (ref 0.1–1.0)
Monocytes Relative: 6.7 % (ref 3.0–12.0)
Neutro Abs: 4.2 10*3/uL (ref 1.4–7.7)
Neutrophils Relative %: 60.9 % (ref 43.0–77.0)
Platelets: 223 10*3/uL (ref 150.0–400.0)
RBC: 5.21 Mil/uL (ref 4.22–5.81)
RDW: 13.2 % (ref 11.5–15.5)
WBC: 6.9 10*3/uL (ref 4.0–10.5)

## 2017-01-20 LAB — HEPATIC FUNCTION PANEL
ALT: 15 U/L (ref 0–53)
AST: 18 U/L (ref 0–37)
Albumin: 4.6 g/dL (ref 3.5–5.2)
Alkaline Phosphatase: 49 U/L (ref 39–117)
Bilirubin, Direct: 0.1 mg/dL (ref 0.0–0.3)
Total Bilirubin: 0.6 mg/dL (ref 0.2–1.2)
Total Protein: 7.5 g/dL (ref 6.0–8.3)

## 2017-01-20 LAB — PSA: PSA: 3 ng/mL (ref 0.10–4.00)

## 2017-01-20 LAB — LIPID PANEL
Cholesterol: 200 mg/dL (ref 0–200)
HDL: 55.1 mg/dL (ref >39.00)
LDL Cholesterol: 121 mg/dL — ABNORMAL HIGH (ref 0–99)
NonHDL: 144.69
Total CHOL/HDL Ratio: 4
Triglycerides: 119 mg/dL (ref 0.0–149.0)
VLDL: 23.8 mg/dL (ref 0.0–40.0)

## 2017-01-20 LAB — TSH: TSH: 1.88 u[IU]/mL (ref 0.35–4.50)

## 2017-01-20 MED ORDER — COLCHICINE 0.6 MG PO TABS: 0.6000 mg | ORAL_TABLET | Freq: Every day | ORAL | 3 refills | Status: DC

## 2017-01-20 MED ORDER — LOSARTAN POTASSIUM 50 MG PO TABS: 50.0000 mg | ORAL_TABLET | Freq: Every day | ORAL | 3 refills | Status: DC

## 2017-01-20 NOTE — Progress Notes (Addendum)
Subjective:    Patient ID: Brent Aloe Sr., male    DOB: 1950-09-21, 67 y.o.   MRN: [id number]  HPI  Patient presents for yearly preventative medicine examination. He is a pleasant 67 year old male who  has a past medical history of Carpal tunnel syndrome, Gout, and Hypertension.  He takes losartan 50 mg for hypertension  He takes Colchicine for chronic gout and has not had any flares.   He was seen by infectious disease for history of hepatitis C.  He has completed Harvoni treatment in the past and a follow-up his hepatitis C was undetectable, he is considered cured.  All immunizations and health maintenance protocols were reviewed with the patient and needed orders were placed. He is up to date on vaccinations   Appropriate screening laboratory values were ordered for the patient including screening of hyperlipidemia, renal function and hepatic function. If indicated by BPH, a PSA was ordered.  Medication reconciliation,  past medical history, social history, problem list and allergies were reviewed in detail with the patient  Goals were established with regard to weight loss, exercise, and  diet in compliance with medications.  Does not do any formal exercise but stays active with hobbies.  He does eat a heart healthy diet  He is up-to-date on colonoscopy, and he participates in routine dental and vision screens.  He has no acute complaints today   Review of Systems  Constitutional: Negative.   HENT: Negative.   Eyes: Negative.   Respiratory: Negative.   Cardiovascular: Negative.   Gastrointestinal: Negative.   Endocrine: Negative.   Genitourinary: Negative.   Musculoskeletal: Negative.   Skin: Negative.   Allergic/Immunologic: Negative.   Neurological: Negative.   Hematological: Negative.   Psychiatric/Behavioral: Negative.   All other systems reviewed and are negative.  Past Medical History:  Diagnosis Date  . Carpal tunnel syndrome   . Gout   . Hypertension      Social History   Socioeconomic History  . Marital status: Married    Spouse name: Not on file  . Number of children: Not on file  . Years of education: Not on file  . Highest education level: Not on file  Social Needs  . Financial resource strain: Not on file  . Food insecurity - worry: Not on file  . Food insecurity - inability: Not on file  . Transportation needs - medical: Not on file  . Transportation needs - non-medical: Not on file  Occupational History  . Not on file  Tobacco Use  . Smoking status: Former Smoker    Last attempt to quit: 01/17/1966    Years since quitting: 51.0  . Smokeless tobacco: Never Used  Substance and Sexual Activity  . Alcohol use: Yes    Comment: occasional beer  . Drug use: No  . Sexual activity: Yes  Other Topics Concern  . 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    Married    Two children, live locally.       Three dogs and two cats     Past Surgical History:  Procedure Laterality Date  . no prior surgery      Family History  Problem Relation Age of Onset  . Ovarian cancer Mother   . Heart disease Father   . Hypertension Father   . Colon cancer Neg Hx     No Known Allergies  Current Outpatient Medications on File Prior to Visit  Medication Sig Dispense Refill  . allopurinol (ZYLOPRIM) 100 MG tablet Take 1 tablet (100 mg total) by mouth daily. 90 tablet 3  . aspirin 81 MG tablet Take 81 mg by mouth daily.    . colchicine 0.6 MG tablet TAKE 1 TABLET BY MOUTH DAILY. 30 tablet 0  . indomethacin (INDOCIN) 25 MG capsule TAKE 1 CAPSULE BY MOUTH 2 TIMES DAILY WITH A MEAL. 15 capsule 0  . losartan (COZAAR) 50 MG tablet Take 1 tablet (50 mg total) by mouth daily. 90 tablet 3  . Multiple Vitamin (MULTIVITAMIN) tablet Take 1 tablet by mouth daily.    . predniSONE (DELTASONE) 10 MG tablet Taper as follows 4-4-4-4-3-3-2-2-1-1 28 tablet 0  .  TURMERIC PO Take 1 capsule by mouth daily.     Current Facility-Administered Medications on File Prior to Visit  Medication Dose Route Frequency Provider Last Rate Last Dose  . triamcinolone acetonide (KENALOG) 10 MG/ML injection 10 mg  10 mg Other Once Landis Martins, DPM        There were no vitals taken for this visit.      Objective:   Physical Exam  Constitutional: He is oriented to person, place, and time. He appears well-developed and well-nourished. No distress.  HENT:  Head: Normocephalic and atraumatic.  Right Ear: External ear normal.  Left Ear: External ear normal.  Nose: Nose normal.  Mouth/Throat: Oropharynx is clear and moist. No oropharyngeal exudate.  Eyes: Conjunctivae and EOM are normal. Pupils are equal, round, and reactive to light. Right eye exhibits no discharge. Left eye exhibits no discharge. No scleral icterus.  Neck: Normal range of motion. Neck supple. No JVD present. No tracheal deviation present. No thyromegaly present.  Cardiovascular: Normal rate, regular rhythm, normal heart sounds and intact distal pulses. Exam reveals no gallop and no friction rub.  No murmur heard. Pulmonary/Chest: Effort normal and breath sounds normal. No stridor. No respiratory distress. He has no wheezes. He has no rales. He exhibits no tenderness.  Abdominal: Soft. Bowel sounds are normal. He exhibits no distension and no mass. There is no tenderness. There is no rebound and no guarding.  Musculoskeletal: Normal range of motion. He exhibits no edema, tenderness or deformity.  Lymphadenopathy:    He has no cervical adenopathy.  Neurological: He is alert and oriented to person, place, and time. He has normal reflexes. He displays normal reflexes. No cranial nerve deficit. He exhibits normal muscle tone. Coordination normal.  Skin: Skin is warm and dry. No rash noted. He is not diaphoretic. No erythema. No pallor.  Psychiatric: He has a normal mood and affect. His behavior is  normal. Judgment and thought content normal.  Nursing note and vitals reviewed.     Assessment & Plan:  1. Routine general medical examination at a health care facility - Benign exam  - Follow up in one year or sooner if needed - Continue to eat healthy and exercise  - Basic metabolic panel - CBC with Differential/Platelet - Hepatic function panel - Lipid panel - TSH  2. Essential hypertension - Well controlled. No change in medications  - Basic metabolic panel - CBC with Differential/Platelet - Hepatic function panel - Lipid panel - TSH  - losartan (COZAAR) 50 MG tablet; Take 1 tablet (50 mg total) by mouth daily.  Dispense: 90 tablet; Refill: 3   3. Prostate cancer screening  - PSA   Dorothyann Peng, NP

## 2017-02-08 MED FILL — COLCHICINE 0.6 MG TABS: 0.6 | 90 days supply | Qty: 90 | Fill #0

## 2017-03-02 IMAGING — US US ABDOMEN COMPLETE W/ ELASTOGRAPHY
1 series · 11 of 11 positions shown · non-contrast
Comparison: 08/18/2014 right upper quadrant ultrasound

CLINICAL DATA: Newly diagnosed hepatitis-C. Hypertension. Elevated
liver function tests.



[Series 1: us abdomen complete w/ elastography · 0.33mm/px · 11 of 11 slices shown]
[im 1/11]
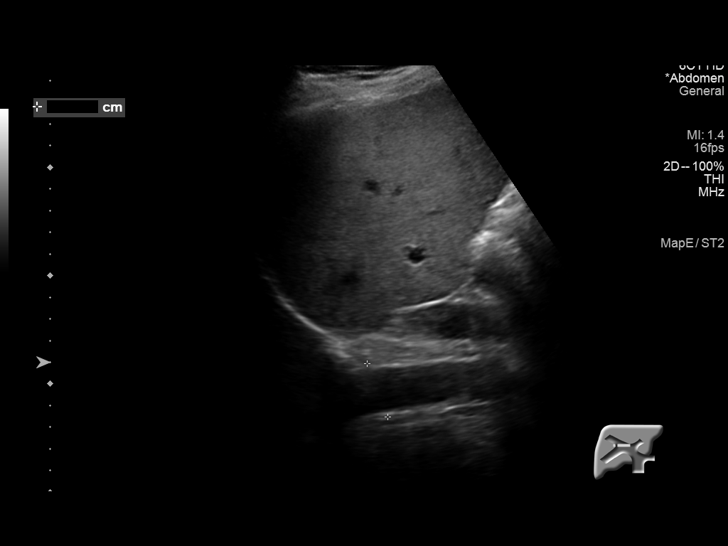
[im 2/11]
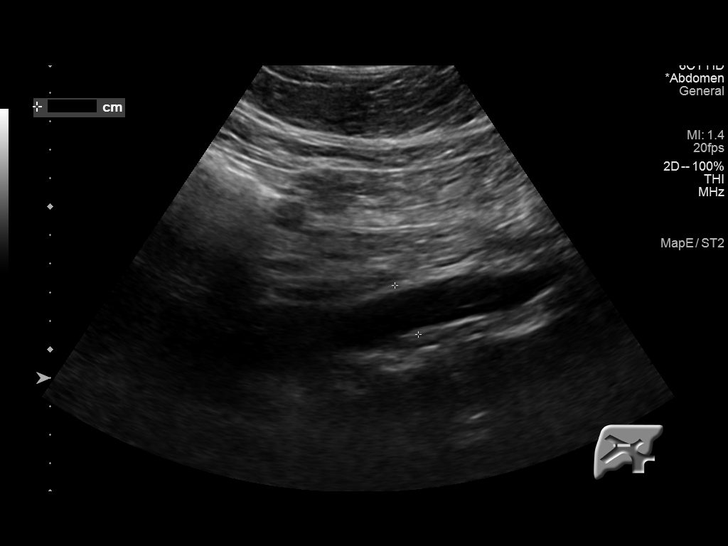
[im 3/11]
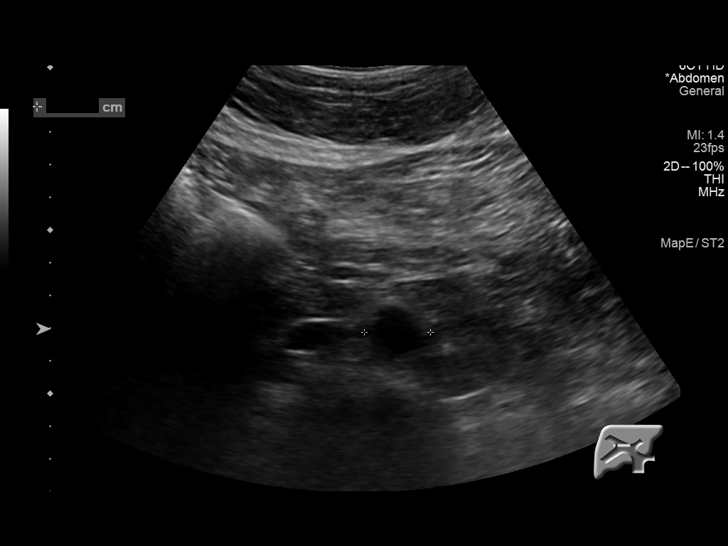
[im 4/11]
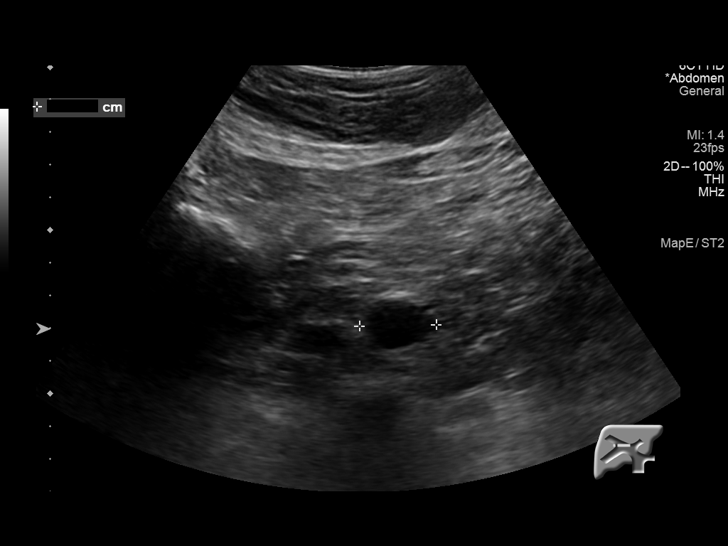
[im 5/11]
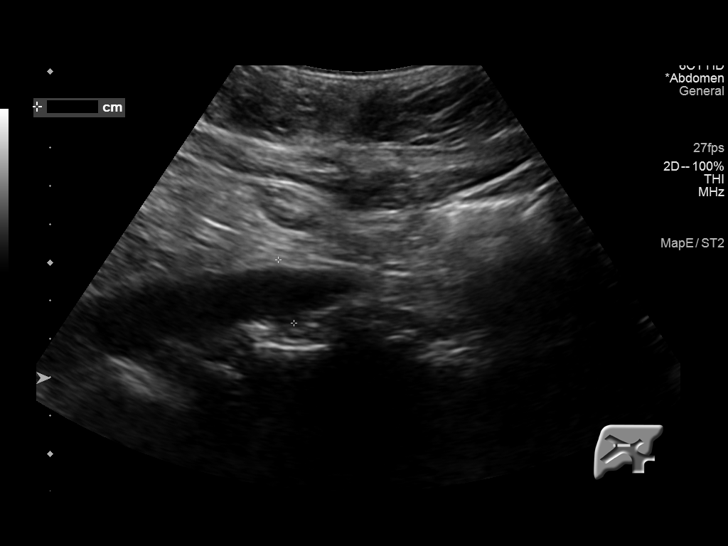
[im 6/11]
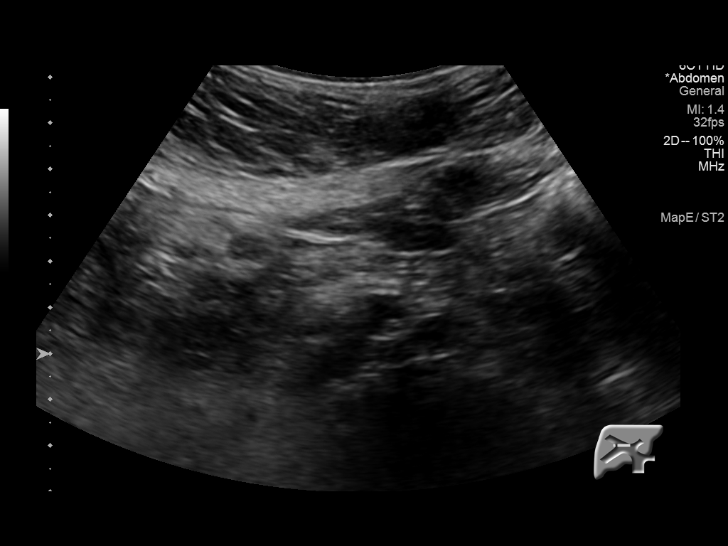
[im 7/11]
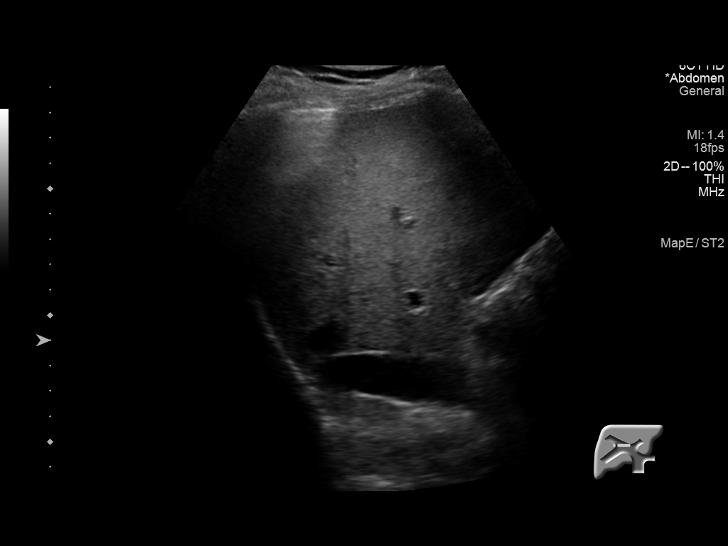
[im 8/11]
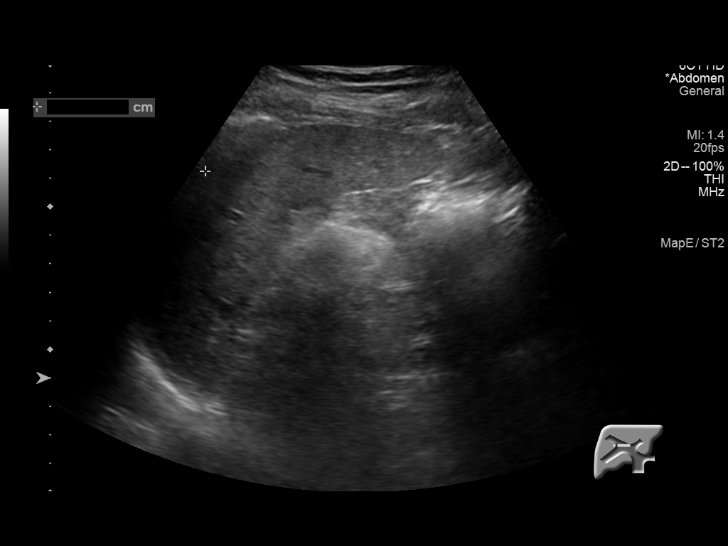
[im 9/11]
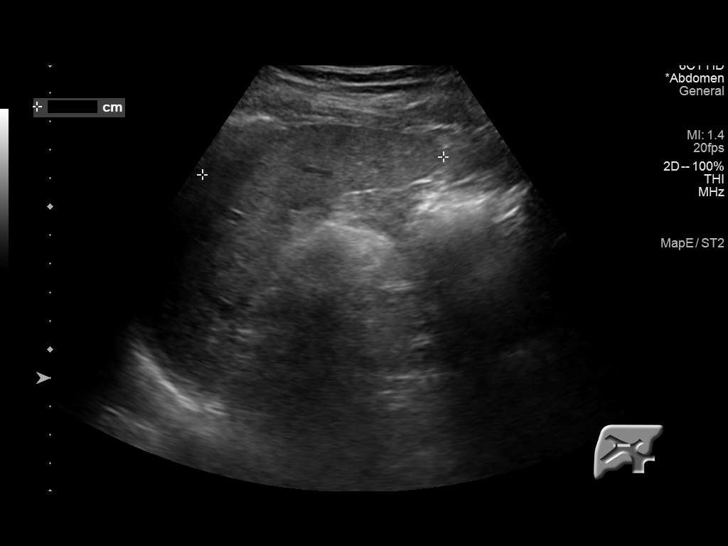
[im 10/11]
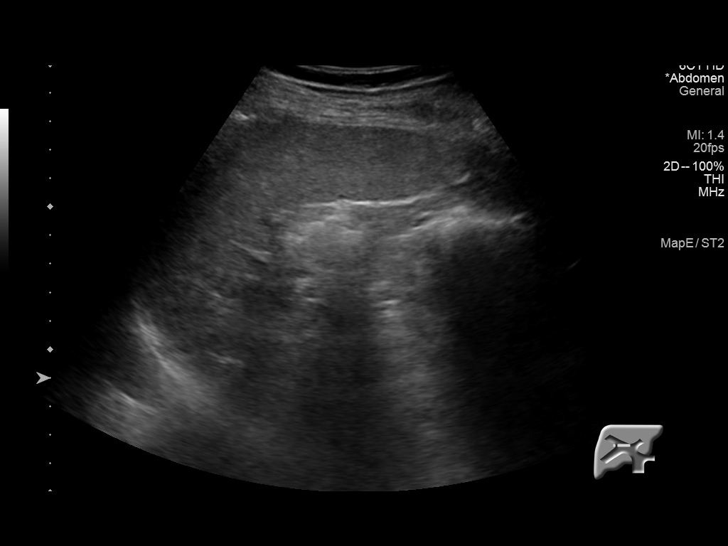
[im 11/11]
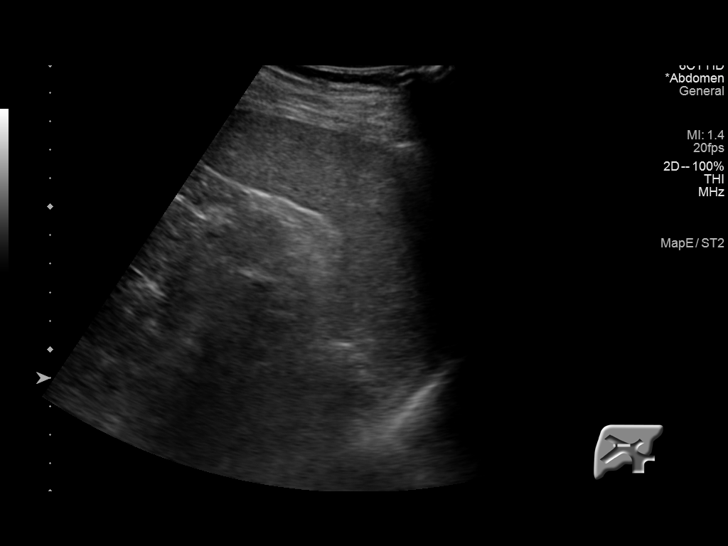

[11 of 11 positions shown; findings below may reference images not displayed]

FINDINGS: ULTRASOUND ABDOMEN

Gallbladder: No gallstones or wall thickening visualized. No
sonographic Murphy sign noted by sonographer.

Common bile duct: Diameter: Normal, 3 mm.

Liver: No focal lesion identified. Within normal limits in
parenchymal echogenicity.

IVC: No abnormality visualized.

Pancreas: Visualized portion unremarkable.

Spleen: Size and appearance within normal limits.

Right Kidney: Length: 9.5 cm. No hydronephrosis. A 1.6 cm cyst or
minimally complex cyst is identified exophytic off the upper pole
right kidney.

Left Kidney: Length: 9.9 cm. Echogenicity within normal limits. No
mass or hydronephrosis visualized.

Abdominal aorta: No aneurysm visualized.

Other findings: No ascites.

ULTRASOUND HEPATIC ELASTOGRAPHY

Device: Siemens Helix VTQ

Patient position: Left Lateral Decubitus

Transducer [id number]

Number of measurements: 10

Hepatic segment:  8

Median velocity:   1.30  m/sec

IQR:

IQR/Median velocity ratio:

Corresponding Metavir fibrosis score:  F2 + some F3

Risk of fibrosis: Moderate

Limitations of exam: None

Pertinent findings noted on other imaging exams:  None

Please note that abnormal shear wave velocities may also be
identified in clinical settings other than with hepatic fibrosis,
such as: acute hepatitis, elevated right heart and central venous
pressures including use of beta blockers, Sepeda disease
([place]), infiltrative processes such as
mastocytosis/amyloidosis/infiltrative tumor, extrahepatic
cholestasis, in the post-prandial state, and liver transplantation.
Correlation with patient history, laboratory data, and clinical
condition recommended.
IMPRESSION: ULTRASOUND ABDOMEN:

1.  No acute abdominal process.  No overt cirrhosis.
2. Right renal cyst or minimally complex cyst.

ULTRASOUND HEPATIC ELASTOGRAPHY:

Median hepatic shear wave velocity is calculated at 1.30 m/sec.

Corresponding Metavir fibrosis score is  F2 + some F3.

Risk of fibrosis is Moderate.

Follow-up: Additional testing appropriate

## 2017-03-10 MED FILL — LOSARTAN POTASSIUM 50 MG TA: 50 | 90 days supply | Qty: 90 | Fill #0

## 2017-05-09 MED FILL — COLCHICINE 0.6 MG TABS: 0.6 | 90 days supply | Qty: 90 | Fill #1

## 2017-06-13 MED FILL — LOSARTAN POTASSIUM 50 MG TA: 50 | 90 days supply | Qty: 90 | Fill #1

## 2017-08-04 MED FILL — COLCHICINE 0.6 MG TABS: 0.6 | 90 days supply | Qty: 90 | Fill #2

## 2017-09-11 MED FILL — LOSARTAN POTASSIUM 50 MG TA: 50 | 90 days supply | Qty: 90 | Fill #2

## 2017-11-06 MED FILL — COLCHICINE 0.6 MG TABS: 0.6 | 90 days supply | Qty: 90 | Fill #3

## 2017-12-11 MED FILL — LOSARTAN POTASSIUM 50 MG TA: 50 | 90 days supply | Qty: 90 | Fill #3

## 2018-01-25 ENCOUNTER — Other Ambulatory Visit: Payer: Self-pay | Admitting: Adult Health

## 2018-01-26 NOTE — Telephone Encounter (Signed)
Due for cpx.

## 2018-01-30 MED FILL — COLCHICINE 0.6 MG TABS: 0.6 | 90 days supply | Qty: 90 | Fill #0

## 2018-01-30 NOTE — Telephone Encounter (Signed)
Pt called to check on this.  Scheduled CPE for 02/04.  Pt states he is completely out of medication and would like for this to be called in today.

## 2018-01-30 NOTE — Telephone Encounter (Signed)
Requested Prescriptions  Pending Prescriptions Disp Refills  . colchicine 0.6 MG tablet [Pharmacy Med Name: COLCHICINE 0.6 MG TABS 0.6 TAB] 90 tablet 0    Sig: TAKE 1 TABLET BY MOUTH DAILY.     Endocrinology:  Gout Agents Failed - 01/30/2018  8:44 AM      Failed - Uric Acid in normal range and within 360 days    Uric Acid, Serum  Date Value Ref Range Status  04/05/2016 8.0 (H) 4.0 - 7.8 mg/dL Final         Failed - Cr in normal range and within 360 days    Creat  Date Value Ref Range Status  07/04/2016 1.01 0.70 - 1.25 mg/dL Final    Comment:      For patients > or = 70 years of age: The upper reference limit for Creatinine is approximately 13% higher for people identified as African-American.      Creatinine, Ser  Date Value Ref Range Status  01/20/2017 0.93 0.40 - 1.50 mg/dL Final         Failed - Valid encounter within last 12 months    Recent Outpatient Visits          1 year ago Routine general medical examination at a health care facility   [facility] [place], [place], NP   1 year ago Acute gout involving toe of right foot, unspecified cause   [facility], Alinda Sierras, MD   1 year ago Acute gout involving toe of right foot, unspecified cause   [facility] at [facility], [place], NP   1 year ago Acute gout involving toe of left foot, unspecified cause   [facility]rap[facility] at [facility], [place], NP   1 year ago Left foot pain   Therapist, [facility] at [facility], Nickola Major, DO      Future Appointments            In 3 weeks Nafziger, Tommi Rumps, NP [facility] [place], [facility]

## 2018-02-20 ENCOUNTER — Encounter: Payer: 59 | Admitting: Adult Health

## 2018-02-27 ENCOUNTER — Encounter: Payer: Self-pay | Admitting: Adult Health

## 2018-02-27 ENCOUNTER — Ambulatory Visit (INDEPENDENT_AMBULATORY_CARE_PROVIDER_SITE_OTHER): Payer: 59 | Admitting: Adult Health

## 2018-02-27 VITALS — BP 136/80 | Temp 99.1°F | Ht 69.0 in | Wt 172.0 lb

## 2018-02-27 DIAGNOSIS — Z23 Encounter for immunization: Secondary | ICD-10-CM

## 2018-02-27 DIAGNOSIS — B182 Chronic viral hepatitis C: Secondary | ICD-10-CM | POA: Diagnosis not present

## 2018-02-27 DIAGNOSIS — Z125 Encounter for screening for malignant neoplasm of prostate: Secondary | ICD-10-CM | POA: Diagnosis not present

## 2018-02-27 DIAGNOSIS — T148XXA Other injury of unspecified body region, initial encounter: Secondary | ICD-10-CM | POA: Diagnosis not present

## 2018-02-27 DIAGNOSIS — I10 Essential (primary) hypertension: Secondary | ICD-10-CM

## 2018-02-27 DIAGNOSIS — Z0000 Encounter for general adult medical examination without abnormal findings: Secondary | ICD-10-CM

## 2018-02-27 DIAGNOSIS — M109 Gout, unspecified: Secondary | ICD-10-CM

## 2018-02-27 MED ORDER — CYCLOBENZAPRINE HCL 10 MG PO TABS: 10.0000 mg | ORAL_TABLET | Freq: Three times a day (TID) | ORAL | 0 refills | Status: DC | PRN

## 2018-02-27 MED ORDER — LOSARTAN POTASSIUM 50 MG PO TABS: 50.0000 mg | ORAL_TABLET | Freq: Every day | ORAL | 3 refills | Status: DC

## 2018-02-27 MED FILL — LOSARTAN POTASSIUM 50 MG TA: 50 | 90 days supply | Qty: 90 | Fill #0

## 2018-02-27 MED FILL — CYCLOBENZAPRINE 10 MG TAB: 10 | 10 days supply | Qty: 30 | Fill #0

## 2018-02-27 NOTE — Addendum Note (Signed)
Addended by: Miles Costain T on: 02/27/2018 02:42 PM   Modules accepted: Orders

## 2018-02-27 NOTE — Patient Instructions (Signed)
It was great seeing you today   Please make an appointment for your lab draw on Friday   We will follow up with you regarding your blood work   I have sent in a prescription for Flexeril - a muscle relaxer to help with your back

## 2018-02-27 NOTE — Addendum Note (Signed)
Addended by: Apolinar Junes on: 02/27/2018 02:39 PM   Modules accepted: Orders

## 2018-02-27 NOTE — Progress Notes (Signed)
Subjective:    Patient ID: Brent Aloe Sr., male    DOB: 1950/03/10, 68 y.o.   MRN: [id number]  HPI Patient presents for yearly preventative medicine examination. He is a pleasant 68 year old male who  has a past medical history of Carpal tunnel syndrome, Gout, and Hypertension.  Essential Hypertension - controlled with Cozaar 50 mg daily  BP Readings from Last 3 Encounters:  01/20/17 132/84  08/12/16 140/90  06/09/16 124/72   H/o Gout - takes Colchicine daily - has not had any recent gout flares  History of Hep C - Completed Harvoni treatment in the past. Last [place] of abdomen was in 01/ 2018  IMPRESSION: ULTRASOUND ABDOMEN:  1.  No acute abdominal process.  No overt cirrhosis. 2. Right renal cyst or minimally complex cyst.  Acute Issue - reports that 3-4 days ago he was moving furniture at his house and sustained a low back injury. He feels as though this is a muscle strain. Pain is located along right lower back. Pain is worse with bending and twisting. He has been using Motrin without resolution. No sciatica   All immunizations and health maintenance protocols were reviewed with the patient and needed orders were placed. Needs Shingrex   Appropriate screening laboratory values were ordered for the patient including screening of hyperlipidemia, renal function and hepatic function. If indicated by BPH, a PSA was ordered.  Medication reconciliation,  past medical history, social history, problem list and allergies were reviewed in detail with the patient  Goals were established with regard to weight loss, exercise, and  diet in compliance with medications  End of life planning was discussed.  He will be due for a colonoscopy in 07/2018. He is up to date on routine dental and vision screens    Review of Systems  Constitutional: Negative.   HENT: Negative.   Eyes: Negative.   Respiratory: Negative.   Cardiovascular: Negative.   Gastrointestinal: Negative.   Endocrine:  Negative.   Genitourinary: Negative.   Musculoskeletal: Positive for myalgias.  Skin: Negative.   Allergic/Immunologic: Negative.   Neurological: Negative.   Hematological: Negative.   Psychiatric/Behavioral: Negative.   All other systems reviewed and are negative.  Past Medical History:  Diagnosis Date  . Carpal tunnel syndrome   . Gout   . Hypertension     Social History   Socioeconomic History  . Marital status: Married    Spouse name: Not on file  . Number of children: Not on file  . Years of education: Not on file  . Highest education level: Not on file  Occupational History  . Not on file  Social Needs  . Financial resource strain: Not on file  . Food insecurity:    Worry: Not on file    Inability: Not on file  . Transportation needs:    Medical: Not on file    Non-medical: Not on file  Tobacco Use  . Smoking status: Former Smoker    Last attempt to quit: 01/17/1966    Years since quitting: 52.1  . Smokeless tobacco: Never Used  Substance and Sexual Activity  . Alcohol use: Yes    Comment: occasional beer  . Drug use: No  . Sexual activity: Yes  Lifestyle  . Physical activity:    Days per week: Not on file    Minutes per session: Not on file  . Stress: Not on file  Relationships  . Social connections:    Talks on phone: Not on file  Gets together: Not on file    Attends religious service: Not on file    Active member of club or organization: Not on file    Attends meetings of clubs or organizations: Not on file    Relationship status: Not on file  . Intimate partner violence:    Fear of current or ex partner: Not on file    Emotionally abused: Not on file    Physically abused: Not on file    Forced sexual activity: Not on file  Other Topics Concern  . 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    Married    Two children, live locally.       Three dogs and two  cats     Past Surgical History:  Procedure Laterality Date  . no prior surgery      Family History  Problem Relation Age of Onset  . Ovarian cancer Mother   . Heart disease Father   . Hypertension Father   . Colon cancer Neg Hx     No Known Allergies  Current Outpatient Medications on File Prior to Visit  Medication Sig Dispense Refill  . aspirin 81 MG tablet Take 81 mg by mouth daily.    . colchicine 0.6 MG tablet TAKE 1 TABLET BY MOUTH DAILY. 90 tablet 0  . losartan (COZAAR) 50 MG tablet Take 1 tablet (50 mg total) by mouth daily. 90 tablet 3  . Multiple Vitamin (MULTIVITAMIN) tablet Take 1 tablet by mouth daily.     Current Facility-Administered Medications on File Prior to Visit  Medication Dose Route Frequency Provider Last Rate Last Dose  . triamcinolone acetonide (KENALOG) 10 MG/ML injection 10 mg  10 mg Other Once Stover, Titorya, DPM        BP 136/80   Temp 99.1 F (37.3 C)   Ht 5\' 9"  (1.753 m)   Wt 172 lb (78 kg)   BMI 25.40 kg/m       Objective:   Physical Exam Vitals signs and nursing note reviewed.  Constitutional:      General: He is not in acute distress.    Appearance: Normal appearance. He is well-developed and normal weight. He is not ill-appearing, toxic-appearing or diaphoretic.  HENT:     Head: Normocephalic and atraumatic.     Right Ear: Tympanic membrane, ear canal and external ear normal. There is no impacted cerumen.     Left Ear: Tympanic membrane, ear canal and external ear normal. There is no impacted cerumen.     Nose: Nose normal. No congestion or rhinorrhea.     Mouth/Throat:     Mouth: Mucous membranes are moist.     Pharynx: Oropharynx is clear. No oropharyngeal exudate or posterior oropharyngeal erythema.  Eyes:     General: No scleral icterus.       Right eye: No discharge.        Left eye: No discharge.     Conjunctiva/sclera: Conjunctivae normal.     Pupils: Pupils are equal, round, and reactive to light.  Neck:      Musculoskeletal: Normal range of motion and neck supple. No neck rigidity or muscular tenderness.     Thyroid: No thyromegaly.     Vascular: No carotid bruit.     Trachea: No tracheal deviation.  Cardiovascular:     Rate and Rhythm: Normal rate and regular rhythm.  Heart sounds: Normal heart sounds. No murmur. No friction rub. No gallop.   Pulmonary:     Effort: Pulmonary effort is normal. No respiratory distress.     Breath sounds: Normal breath sounds. No stridor. No wheezing, rhonchi or rales.  Chest:     Chest wall: No tenderness.  Abdominal:     General: Bowel sounds are normal. There is no distension.     Palpations: Abdomen is soft. There is no mass.     Tenderness: There is no abdominal tenderness. There is no right CVA tenderness, left CVA tenderness, guarding or rebound.     Hernia: No hernia is present.  Genitourinary:    Comments: Will do PSA  Musculoskeletal: Normal range of motion.        General: Tenderness (right lower back. No spinal tenderness) present. No swelling, deformity or signs of injury.     Right lower leg: No edema.     Left lower leg: No edema.  Lymphadenopathy:     Cervical: No cervical adenopathy.  Skin:    General: Skin is warm and dry.     Capillary Refill: Capillary refill takes less than 2 seconds.     Coloration: Skin is not jaundiced or pale.     Findings: No bruising, erythema, lesion or rash.  Neurological:     General: No focal deficit present.     Mental Status: He is alert and oriented to person, place, and time. Mental status is at baseline.     Cranial Nerves: No cranial nerve deficit.     Coordination: Coordination normal.  Psychiatric:        Mood and Affect: Mood normal.        Behavior: Behavior normal.        Thought Content: Thought content normal.        Judgment: Judgment normal.        Assessment & Plan:  1. Routine general medical examination at a health care facility - Continue to eat healthy and exercise  -  Follow up in one year or sooner if needed - CBC with Differential/Platelet; Future - Comprehensive metabolic panel; Future - Lipid panel; Future - TSH; Future  2. Chronic hepatitis C without hepatic coma (HCC)  - US Abdomen Limited RUQ; Future  3. Essential hypertension - No change in medication  - CBC with Differential/Platelet; Future - Comprehensive metabolic panel; Future - Lipid panel; Future - TSH; Future  4. Acute gout involving toe of right foot, unspecified cause  - Uric Acid; Future  5. Prostate cancer screening  - PSA; Future  6. Muscle strain - cyclobenzaprine (FLEXERIL) 10 MG tablet; Take 1 tablet (10 mg total) by mouth 3 (three) times daily as needed for muscle spasms.  Dispense: 30 tablet; Refill: 0 - Use heating pad  - Follow up if no improvement in the next 2-3 days   Dorothyann Peng, AGNP

## 2018-03-02 ENCOUNTER — Other Ambulatory Visit (INDEPENDENT_AMBULATORY_CARE_PROVIDER_SITE_OTHER): Payer: 59

## 2018-03-02 DIAGNOSIS — M109 Gout, unspecified: Secondary | ICD-10-CM | POA: Diagnosis not present

## 2018-03-02 DIAGNOSIS — Z0000 Encounter for general adult medical examination without abnormal findings: Secondary | ICD-10-CM | POA: Diagnosis not present

## 2018-03-02 DIAGNOSIS — Z125 Encounter for screening for malignant neoplasm of prostate: Secondary | ICD-10-CM | POA: Diagnosis not present

## 2018-03-02 DIAGNOSIS — I10 Essential (primary) hypertension: Secondary | ICD-10-CM | POA: Diagnosis not present

## 2018-03-02 LAB — COMPREHENSIVE METABOLIC PANEL
ALT: 15 U/L (ref 0–53)
AST: 14 U/L (ref 0–37)
Albumin: 4.7 g/dL (ref 3.5–5.2)
Alkaline Phosphatase: 55 U/L (ref 39–117)
BUN: 22 mg/dL (ref 6–23)
CO2: 28 mEq/L (ref 19–32)
Calcium: 9.8 mg/dL (ref 8.4–10.5)
Chloride: 100 mEq/L (ref 96–112)
Creatinine, Ser: 1.06 mg/dL (ref 0.40–1.50)
GFR: 69.2 mL/min (ref >60.00)
Glucose, Bld: 89 mg/dL (ref 70–99)
Potassium: 4.7 mEq/L (ref 3.5–5.1)
Sodium: 138 mEq/L (ref 135–145)
Total Bilirubin: 0.5 mg/dL (ref 0.2–1.2)
Total Protein: 7.5 g/dL (ref 6.0–8.3)

## 2018-03-02 LAB — CBC WITH DIFFERENTIAL/PLATELET
Basophils Absolute: 0.1 10*3/uL (ref 0.0–0.1)
Basophils Relative: 0.9 % (ref 0.0–3.0)
Eosinophils Absolute: 0.2 10*3/uL (ref 0.0–0.7)
Eosinophils Relative: 2.5 % (ref 0.0–5.0)
HCT: 45.8 % (ref 39.0–52.0)
Hemoglobin: 15.8 g/dL (ref 13.0–17.0)
Lymphocytes Relative: 21.5 % (ref 12.0–46.0)
Lymphs Abs: 1.7 10*3/uL (ref 0.7–4.0)
MCHC: 34.6 g/dL (ref 30.0–36.0)
MCV: 88 fl (ref 78.0–100.0)
Monocytes Absolute: 0.8 10*3/uL (ref 0.1–1.0)
Monocytes Relative: 9.4 % (ref 3.0–12.0)
Neutro Abs: 5.2 10*3/uL (ref 1.4–7.7)
Neutrophils Relative %: 65.7 % (ref 43.0–77.0)
Platelets: 251 10*3/uL (ref 150.0–400.0)
RBC: 5.2 Mil/uL (ref 4.22–5.81)
RDW: 13.3 % (ref 11.5–15.5)
WBC: 8 10*3/uL (ref 4.0–10.5)

## 2018-03-02 LAB — LIPID PANEL
Cholesterol: 172 mg/dL (ref 0–200)
HDL: 41.3 mg/dL (ref >39.00)
LDL Cholesterol: 102 mg/dL — ABNORMAL HIGH (ref 0–99)
NonHDL: 130.72
Total CHOL/HDL Ratio: 4
Triglycerides: 144 mg/dL (ref 0.0–149.0)
VLDL: 28.8 mg/dL (ref 0.0–40.0)

## 2018-03-02 LAB — PSA: PSA: 4.16 ng/mL — ABNORMAL HIGH (ref 0.10–4.00)

## 2018-03-02 LAB — TSH: TSH: 2.09 u[IU]/mL (ref 0.35–4.50)

## 2018-03-02 LAB — URIC ACID: Uric Acid, Serum: 7.4 mg/dL (ref 4.0–7.8)

## 2018-03-06 ENCOUNTER — Telehealth: Payer: Self-pay | Admitting: Adult Health

## 2018-03-06 ENCOUNTER — Other Ambulatory Visit: Payer: Self-pay | Admitting: Family Medicine

## 2018-03-06 DIAGNOSIS — R972 Elevated prostate specific antigen [PSA]: Secondary | ICD-10-CM

## 2018-03-06 NOTE — Telephone Encounter (Signed)
Copied from [place] [id number]. Topic: General - Other >> Mar 06, 2018  2:18 PM Yvette Rack wrote: Reason for CRM: pt calling about lab results

## 2018-03-06 NOTE — Progress Notes (Signed)
PSA ordered

## 2018-03-06 NOTE — Telephone Encounter (Signed)
Pt returned call for lab results. Lab message given. Scheduled for 3 week follow up PSA. Need lab order.

## 2018-03-19 ENCOUNTER — Other Ambulatory Visit: Payer: 59

## 2018-03-23 ENCOUNTER — Other Ambulatory Visit (INDEPENDENT_AMBULATORY_CARE_PROVIDER_SITE_OTHER): Payer: 59

## 2018-03-23 DIAGNOSIS — R972 Elevated prostate specific antigen [PSA]: Secondary | ICD-10-CM | POA: Diagnosis not present

## 2018-03-23 LAB — PSA: PSA: 5.01 ng/mL — ABNORMAL HIGH (ref 0.10–4.00)

## 2018-03-29 ENCOUNTER — Other Ambulatory Visit: Payer: Self-pay | Admitting: Family Medicine

## 2018-03-29 DIAGNOSIS — R972 Elevated prostate specific antigen [PSA]: Secondary | ICD-10-CM

## 2018-03-29 NOTE — Progress Notes (Signed)
am

## 2018-06-05 ENCOUNTER — Other Ambulatory Visit: Payer: Self-pay | Admitting: Adult Health

## 2018-06-05 MED FILL — LOSARTAN POTASSIUM 50 MG TA: 50 | 30 days supply | Qty: 30 | Fill #0

## 2018-06-07 MED FILL — COLCHICINE 0.6 MG TABS: 0.6 | 90 days supply | Qty: 90 | Fill #0

## 2018-06-07 NOTE — Telephone Encounter (Signed)
Sent to the pharmacy by e-scribe. 

## 2018-07-05 MED FILL — LOSARTAN POTASSIUM 50 MG TA: 50 | 30 days supply | Qty: 30 | Fill #1

## 2018-07-06 ENCOUNTER — Encounter: Payer: Self-pay | Admitting: Gastroenterology

## 2018-08-10 MED FILL — LOSARTAN POTASSIUM 50 MG TA: 50 | 30 days supply | Qty: 30 | Fill #2

## 2018-09-10 MED FILL — LOSARTAN POTASSIUM 50 MG TA: 50 | 30 days supply | Qty: 30 | Fill #3

## 2018-10-08 MED FILL — LOSARTAN POTASSIUM 50 MG TA: 50 | 30 days supply | Qty: 30 | Fill #4

## 2018-10-08 MED FILL — COLCHICINE 0.6 MG TABS: 0.6 | 90 days supply | Qty: 90 | Fill #1

## 2018-11-06 MED FILL — LOSARTAN POTASSIUM 50 MG TA: 50 | 30 days supply | Qty: 30 | Fill #5

## 2018-12-07 MED FILL — LOSARTAN POTASSIUM 50 MG TA: 50 | 90 days supply | Qty: 90 | Fill #6

## 2019-03-04 ENCOUNTER — Other Ambulatory Visit: Payer: Self-pay | Admitting: Adult Health

## 2019-03-04 DIAGNOSIS — I10 Essential (primary) hypertension: Secondary | ICD-10-CM

## 2019-03-05 MED FILL — LOSARTAN POTASSIUM 50 MG TA: 50 | 30 days supply | Qty: 30 | Fill #0

## 2019-03-05 NOTE — Telephone Encounter (Signed)
Patient need to schedule an ov for more refills. Pt is due for CPE.

## 2019-03-12 MED FILL — COLCHICINE 0.6 MG TABS: 0.6 | 90 days supply | Qty: 90 | Fill #2

## 2019-04-09 ENCOUNTER — Other Ambulatory Visit: Payer: Self-pay | Admitting: Adult Health

## 2019-04-09 DIAGNOSIS — I10 Essential (primary) hypertension: Secondary | ICD-10-CM

## 2019-04-09 MED FILL — LOSARTAN POTASSIUM 50 MG TA: 50 | 30 days supply | Qty: 30 | Fill #0

## 2019-04-09 NOTE — Telephone Encounter (Signed)
30 DAY SUPPLY SENT TO THE PHARMACY WITH MESSAGE TO SCHEDULE APPT.

## 2019-05-08 ENCOUNTER — Other Ambulatory Visit: Payer: Self-pay | Admitting: Adult Health

## 2019-05-08 DIAGNOSIS — I10 Essential (primary) hypertension: Secondary | ICD-10-CM

## 2019-05-10 ENCOUNTER — Telehealth: Payer: Self-pay | Admitting: Adult Health

## 2019-05-10 DIAGNOSIS — I10 Essential (primary) hypertension: Secondary | ICD-10-CM

## 2019-05-10 MED ORDER — LOSARTAN POTASSIUM 50 MG PO TABS: 50.0000 mg | ORAL_TABLET | Freq: Every day | ORAL | 1 refills | Status: DC

## 2019-05-10 MED FILL — LOSARTAN POTASSIUM 50 MG TA: 50 | 30 days supply | Qty: 30 | Fill #0

## 2019-05-10 NOTE — Telephone Encounter (Signed)
[place]   Pt would like medication refilled, he has scheduled yearly physical.

## 2019-05-10 NOTE — Telephone Encounter (Signed)
Sent losartan to the pharmacy.  Nothing further needed.

## 2019-06-10 ENCOUNTER — Other Ambulatory Visit: Payer: Self-pay

## 2019-06-11 ENCOUNTER — Other Ambulatory Visit: Payer: Self-pay | Admitting: Adult Health

## 2019-06-11 ENCOUNTER — Ambulatory Visit (INDEPENDENT_AMBULATORY_CARE_PROVIDER_SITE_OTHER): Payer: No Typology Code available for payment source | Admitting: Adult Health

## 2019-06-11 ENCOUNTER — Encounter: Payer: Self-pay | Admitting: Adult Health

## 2019-06-11 VITALS — BP 154/92 | Temp 98.0°F | Ht 69.0 in | Wt 177.0 lb

## 2019-06-11 DIAGNOSIS — M109 Gout, unspecified: Secondary | ICD-10-CM

## 2019-06-11 DIAGNOSIS — B182 Chronic viral hepatitis C: Secondary | ICD-10-CM | POA: Diagnosis not present

## 2019-06-11 DIAGNOSIS — Z0000 Encounter for general adult medical examination without abnormal findings: Secondary | ICD-10-CM

## 2019-06-11 DIAGNOSIS — Z125 Encounter for screening for malignant neoplasm of prostate: Secondary | ICD-10-CM

## 2019-06-11 DIAGNOSIS — I10 Essential (primary) hypertension: Secondary | ICD-10-CM | POA: Diagnosis not present

## 2019-06-11 DIAGNOSIS — Z1211 Encounter for screening for malignant neoplasm of colon: Secondary | ICD-10-CM

## 2019-06-11 LAB — COMPREHENSIVE METABOLIC PANEL
ALT: 19 U/L (ref 0–53)
AST: 18 U/L (ref 0–37)
Albumin: 4.8 g/dL (ref 3.5–5.2)
Alkaline Phosphatase: 54 U/L (ref 39–117)
BUN: 19 mg/dL (ref 6–23)
CO2: 28 mEq/L (ref 19–32)
Calcium: 9.9 mg/dL (ref 8.4–10.5)
Chloride: 102 mEq/L (ref 96–112)
Creatinine, Ser: 1.06 mg/dL (ref 0.40–1.50)
GFR: 68.95 mL/min (ref >60.00)
Glucose, Bld: 100 mg/dL — ABNORMAL HIGH (ref 70–99)
Potassium: 4.3 mEq/L (ref 3.5–5.1)
Sodium: 139 mEq/L (ref 135–145)
Total Bilirubin: 0.5 mg/dL (ref 0.2–1.2)
Total Protein: 7.5 g/dL (ref 6.0–8.3)

## 2019-06-11 LAB — CBC WITH DIFFERENTIAL/PLATELET
Basophils Absolute: 0.1 10*3/uL (ref 0.0–0.1)
Basophils Relative: 0.8 % (ref 0.0–3.0)
Eosinophils Absolute: 0.1 10*3/uL (ref 0.0–0.7)
Eosinophils Relative: 1.8 % (ref 0.0–5.0)
HCT: 43.7 % (ref 39.0–52.0)
Hemoglobin: 15.2 g/dL (ref 13.0–17.0)
Lymphocytes Relative: 23.4 % (ref 12.0–46.0)
Lymphs Abs: 1.8 10*3/uL (ref 0.7–4.0)
MCHC: 34.7 g/dL (ref 30.0–36.0)
MCV: 89.4 fl (ref 78.0–100.0)
Monocytes Absolute: 0.5 10*3/uL (ref 0.1–1.0)
Monocytes Relative: 6.9 % (ref 3.0–12.0)
Neutro Abs: 5.3 10*3/uL (ref 1.4–7.7)
Neutrophils Relative %: 67.1 % (ref 43.0–77.0)
Platelets: 259 10*3/uL (ref 150.0–400.0)
RBC: 4.89 Mil/uL (ref 4.22–5.81)
RDW: 13 % (ref 11.5–15.5)
WBC: 7.9 10*3/uL (ref 4.0–10.5)

## 2019-06-11 LAB — LIPID PANEL
Cholesterol: 215 mg/dL — ABNORMAL HIGH (ref 0–200)
HDL: 44.7 mg/dL (ref >39.00)
NonHDL: 170.79
Total CHOL/HDL Ratio: 5
Triglycerides: 258 mg/dL — ABNORMAL HIGH (ref 0.0–149.0)
VLDL: 51.6 mg/dL — ABNORMAL HIGH (ref 0.0–40.0)

## 2019-06-11 LAB — TSH: TSH: 1.67 u[IU]/mL (ref 0.35–4.50)

## 2019-06-11 LAB — LDL CHOLESTEROL, DIRECT: Direct LDL: 134 mg/dL

## 2019-06-11 LAB — PSA: PSA: 4.18 ng/mL — ABNORMAL HIGH (ref 0.10–4.00)

## 2019-06-11 MED ORDER — COLCHICINE 0.6 MG PO TABS: 0.6000 mg | ORAL_TABLET | ORAL | 3 refills | Status: DC

## 2019-06-11 MED ORDER — LOSARTAN POTASSIUM 50 MG PO TABS: 50.0000 mg | ORAL_TABLET | Freq: Every day | ORAL | 3 refills | Status: DC

## 2019-06-11 MED FILL — COLCHICINE 0.6 MG TABS: 0.6 | 90 days supply | Qty: 45 | Fill #0

## 2019-06-11 MED FILL — LOSARTAN POTASSIUM 50 MG TA: 50 | 90 days supply | Qty: 90 | Fill #0

## 2019-06-11 NOTE — Progress Notes (Signed)
Subjective:    Patient ID: Brent Aloe Sr., male    DOB: Nov 13, 1950, 69 y.o.   MRN: [id number]  HPI Patient presents for yearly preventative medicine examination. He is a pleasant 69 year old male who  has a past medical history of Carpal tunnel syndrome, Gout, and Hypertension.  Essential Hypertension - currently prescribed Cozaar 50 mg.  He denies dizziness, lightheadedness, chest pain or shortness of breath. He did not take his blood pressure medication today. Does not monitor BP at home.  BP Readings from Last 3 Encounters:  06/11/19 (!) 154/92  02/27/18 136/80  01/20/17 132/84   Gout - Takes colchicine every other daily - denies recent gout flares.   H/o Hep C -he completed Harvoni treatment in 2018.  His last ultrasound of the liver was done in 2018 and he failed to follow-up for repeat ultrasound last year.  All immunizations and health maintenance protocols were reviewed with the patient and needed orders were placed. UTD on vaccinations   Appropriate screening laboratory values were ordered for the patient including screening of hyperlipidemia, renal function and hepatic function. If indicated by BPH, a PSA was ordered.  Medication reconciliation,  past medical history, social history, problem list and allergies were reviewed in detail with the patient  Goals were established with regard to weight loss, exercise, and  diet in compliance with medications  Wt Readings from Last 3 Encounters:  06/11/19 177 lb (80.3 kg)  02/27/18 172 lb (78 kg)  01/20/17 170 lb 12.8 oz (77.5 kg)   He was due in July 2020 for repeat colonoscopy but this was never done.  He is up-to-date on routine dental and vision screens  He has no acute issues.   Review of Systems  Constitutional: Negative.   HENT: Negative.   Eyes: Negative.   Respiratory: Negative.   Cardiovascular: Negative.   Gastrointestinal: Negative.   Endocrine: Negative.   Genitourinary: Negative.   Musculoskeletal:  Negative.   Skin: Negative.   Allergic/Immunologic: Negative.   Neurological: Negative.   Hematological: Negative.   Psychiatric/Behavioral: Negative.   All other systems reviewed and are negative.  Past Medical History:  Diagnosis Date  . Carpal tunnel syndrome   . Gout   . Hypertension     Social History   Socioeconomic History  . Marital status: Married    Spouse name: Not on file  . Number of children: Not on file  . Years of education: Not on file  . Highest education level: Not on file  Occupational History  . Not on file  Tobacco Use  . Smoking status: Former Smoker    Quit date: 01/17/1966    Years since quitting: 53.4  . Smokeless tobacco: Never Used  Substance and Sexual Activity  . Alcohol use: Yes    Comment: occasional beer  . Drug use: No  . Sexual activity: Yes  Other Topics Concern  . 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    Married    Two children, live locally.       Three dogs and two cats    Social Determinants of Health   Financial Resource Strain:   . Difficulty of Paying Living Expenses:   Food Insecurity:   . Worried About Charity fundraiser in the Last Year:   . Arboriculturist in the Last Year:   Transportation Needs:   .  Lack of Transportation (Medical):   Marland Kitchen Lack of Transportation (Non-Medical):   Physical Activity:   . Days of Exercise per Week:   . Minutes of Exercise per Session:   Stress:   . Feeling of Stress :   Social Connections:   . Frequency of Communication with Friends and Family:   . Frequency of Social Gatherings with Friends and Family:   . Attends Religious Services:   . Active Member of Clubs or Organizations:   . Attends Archivist Meetings:   Marland Kitchen Marital Status:   Intimate Partner Violence:   . Fear of Current or Ex-Partner:   . Emotionally Abused:   Marland Kitchen Physically Abused:   . Sexually Abused:     Past  Surgical History:  Procedure Laterality Date  . no prior surgery      Family History  Problem Relation Age of Onset  . Ovarian cancer Mother   . Heart disease Father   . Hypertension Father   . Colon cancer Neg Hx     No Known Allergies  Current Outpatient Medications on File Prior to Visit  Medication Sig Dispense Refill  . aspirin 81 MG tablet Take 81 mg by mouth daily.    . Multiple Vitamin (MULTIVITAMIN) tablet Take 1 tablet by mouth daily.     Current Facility-Administered Medications on File Prior to Visit  Medication Dose Route Frequency Provider Last Rate Last Admin  . triamcinolone acetonide (KENALOG) 10 MG/ML injection 10 mg  10 mg Other Once Stover, Titorya, DPM        BP (!) 154/92 Comment: NO MEDS  Temp 98 F (36.7 C)   Ht 5\' 9"  (1.753 m)   Wt 177 lb (80.3 kg)   BMI 26.14 kg/m       Objective:   Physical Exam Vitals and nursing note reviewed.  Constitutional:      General: He is not in acute distress.    Appearance: Normal appearance. He is well-developed and normal weight.  HENT:     Head: Normocephalic and atraumatic.     Right Ear: Tympanic membrane, ear canal and external ear normal. There is no impacted cerumen.     Left Ear: Tympanic membrane, ear canal and external ear normal. There is no impacted cerumen.     Nose: Nose normal. No congestion or rhinorrhea.     Mouth/Throat:     Mouth: Mucous membranes are moist.     Pharynx: Oropharynx is clear. No oropharyngeal exudate or posterior oropharyngeal erythema.  Eyes:     General:        Right eye: No discharge.        Left eye: No discharge.     Extraocular Movements: Extraocular movements intact.     Conjunctiva/sclera: Conjunctivae normal.     Pupils: Pupils are equal, round, and reactive to light.  Neck:     Vascular: No carotid bruit.     Trachea: No tracheal deviation.  Cardiovascular:     Rate and Rhythm: Normal rate and regular rhythm.     Pulses: Normal pulses.     Heart sounds:  Normal heart sounds. No murmur. No friction rub. No gallop.   Pulmonary:     Effort: Pulmonary effort is normal. No respiratory distress.     Breath sounds: Normal breath sounds. No stridor. No wheezing, rhonchi or rales.  Chest:     Chest wall: No tenderness.  Abdominal:     General: Bowel sounds are normal. There is no distension.  Palpations: Abdomen is soft. There is no mass.     Tenderness: There is no abdominal tenderness. There is no right CVA tenderness, left CVA tenderness, guarding or rebound.     Hernia: No hernia is present.  Musculoskeletal:        General: No swelling, tenderness, deformity or signs of injury. Normal range of motion.     Right lower leg: No edema.     Left lower leg: No edema.  Lymphadenopathy:     Cervical: No cervical adenopathy.  Skin:    General: Skin is warm and dry.     Capillary Refill: Capillary refill takes less than 2 seconds.     Coloration: Skin is not jaundiced or pale.     Findings: No bruising, erythema, lesion or rash.  Neurological:     General: No focal deficit present.     Mental Status: He is alert and oriented to person, place, and time.     Cranial Nerves: No cranial nerve deficit.     Sensory: No sensory deficit.     Motor: No weakness.     Coordination: Coordination normal.     Gait: Gait normal.     Deep Tendon Reflexes: Reflexes normal.  Psychiatric:        Mood and Affect: Mood normal.        Behavior: Behavior normal.        Thought Content: Thought content normal.        Judgment: Judgment normal.       Assessment & Plan:  1. Routine general medical examination at a health care facility - Encouraged lifestyle modifications for weight loss  - Follow up in one year or sooner if needed - CBC with Differential/Platelet - Comprehensive metabolic panel - Lipid panel - TSH  2. Essential hypertension - Monitor BP at home and was advised to let me know if it is not at goal  - CBC with Differential/Platelet -  Comprehensive metabolic panel - Lipid panel - TSH - losartan (COZAAR) 50 MG tablet; Take 1 tablet (50 mg total) by mouth daily. **DUE FOR YEARLY PHYSICAL**  Dispense: 90 tablet; Refill: 3  3. Chronic hepatitis C without hepatic coma (HCC)  - CBC with Differential/Platelet - Comprehensive metabolic panel - Lipid panel - TSH - US ABDOMEN RUQ W/ELASTOGRAPHY; Future  4. Prostate cancer screening  - PSA  5. Acute gout involving toe of right foot, unspecified cause  - colchicine 0.6 MG tablet; Take 1 tablet (0.6 mg total) by mouth every other day.  Dispense: 45 tablet; Refill: 3  6. Colon cancer screening - Call and schedule colonoscopy   Dorothyann Peng, NP

## 2019-06-11 NOTE — Patient Instructions (Signed)
It was great seeing you today   Please schedule your colonoscopy   Someone will call you to schedule your ultrasound   We will follow up with you regarding your blood work   Work on lifestyle modifications to help with weight loss

## 2019-06-12 ENCOUNTER — Other Ambulatory Visit: Payer: Self-pay | Admitting: Family Medicine

## 2019-06-12 MED ORDER — ROSUVASTATIN CALCIUM 5 MG PO TABS
5.0000 mg | ORAL_TABLET | Freq: Every day | ORAL | 3 refills | Status: DC
Start: 2019-06-12 — End: 2020-02-21

## 2019-06-12 MED FILL — ROSUVASTATIN CALCIUM 5 MG T: 5 | 90 days supply | Qty: 90 | Fill #0

## 2019-07-10 ENCOUNTER — Telehealth: Payer: Self-pay | Admitting: Adult Health

## 2019-07-10 NOTE — Telephone Encounter (Signed)
Pt has been checking his blood pressure just how Tommi Rumps, asked him. Pt says, his blood pressure this morning was at 148/103 and he just checked it a few minutes ago and it's 167/104. Pt is not sure what Tommi Rumps, would like for him to do next. Thanks

## 2019-07-11 NOTE — Telephone Encounter (Signed)
Patient called back in regard to what [facility] suggest he do about his BP and I advised the patient that Tommi Rumps is not in the office till next Tuesday. The patient was aware and was wanting to talk with [facility]. Misty is out of the office today and the patient dropped the call. The patient is just wanting to know what he needs to do about his BP.  Please advise

## 2019-07-16 NOTE — Telephone Encounter (Signed)
LMVM to contact the office to schedule an appointment to follow up with [facility] and bring his BP cuff

## 2019-07-16 NOTE — Telephone Encounter (Signed)
Patient is scheduled   

## 2019-07-18 ENCOUNTER — Ambulatory Visit (INDEPENDENT_AMBULATORY_CARE_PROVIDER_SITE_OTHER)
Admission: RE | Admit: 2019-07-18 | Discharge: 2019-07-18 | Disposition: A | Discharge status: Home / Self Care | Payer: No Typology Code available for payment source | Source: Ambulatory Visit | Attending: Adult Health | Admitting: Adult Health

## 2019-07-18 ENCOUNTER — Other Ambulatory Visit: Payer: Self-pay

## 2019-07-18 ENCOUNTER — Ambulatory Visit (INDEPENDENT_AMBULATORY_CARE_PROVIDER_SITE_OTHER): Payer: No Typology Code available for payment source | Admitting: Adult Health

## 2019-07-18 ENCOUNTER — Encounter: Payer: Self-pay | Admitting: Gastroenterology

## 2019-07-18 VITALS — BP 140/98 | HR 88 | Temp 97.7°F | Ht 69.0 in

## 2019-07-18 DIAGNOSIS — I10 Essential (primary) hypertension: Secondary | ICD-10-CM

## 2019-07-18 DIAGNOSIS — M79671 Pain in right foot: Secondary | ICD-10-CM

## 2019-07-18 MED ORDER — TRAMADOL HCL 50 MG PO TABS: 50.0000 mg | ORAL_TABLET | Freq: Three times a day (TID) | ORAL | 0 refills | Status: AC | PRN

## 2019-07-18 MED FILL — traMADol HCL 50 MG TABS: 50 | 5 days supply | Qty: 15 | Fill #0

## 2019-07-18 NOTE — Progress Notes (Signed)
Subjective:    Patient ID: Brent Aloe Sr., male    DOB: 1950/04/30, 69 y.o.   MRN: [id number]  HPI 69 year old male who  has a past medical history of Carpal tunnel syndrome, Gout, and Hypertension. He presents to the office today for follow-up regarding hypertension.  At his CPE 1 month ago his blood pressure was elevated at 154/92 despite being on Cozaar 50 mg.  He was advised to monitor his blood pressure at home and he has been doing so over the last month.  He is getting readings between 782 and 956 systolic over 21H to 08M.  He is asymptomatic.  Additionally, he has an acute complaint that of right heel pain.  This pain started approximately 3 to 4 days ago.  Reports that on the first day and he noticed some mild pain he had no trauma or aggravating injury but was on his feet a lot, when he woke up the next morning the pain had increased and has so since.  He is currently not able to bear any weight on his right foot and has been using crutches.  Does have a history of gout but has not noticed any redness, warmth, swelling, or bruising.   Review of Systems See HPI   Past Medical History:  Diagnosis Date  . Carpal tunnel syndrome   . Gout   . Hypertension     Social History   Socioeconomic History  . Marital status: Married    Spouse name: Not on file  . Number of children: Not on file  . Years of education: Not on file  . Highest education level: Not on file  Occupational History  . Not on file  Tobacco Use  . Smoking status: Former Smoker    Quit date: 01/17/1966    Years since quitting: 53.5  . Smokeless tobacco: Never Used  Vaping Use  . Vaping Use: Never used  Substance and Sexual Activity  . Alcohol use: Yes    Comment: occasional beer  . Drug use: No  . Sexual activity: Yes  Other Topics Concern  . 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    Married    Two children,  live locally.       Three dogs and two cats    Social Determinants of Health   Financial Resource Strain:   . Difficulty of Paying Living Expenses:   Food Insecurity:   . Worried About Charity fundraiser in the Last Year:   . Arboriculturist in the Last Year:   Transportation Needs:   . Film/video editor (Medical):   Marland Kitchen Lack of Transportation (Non-Medical):   Physical Activity:   . Days of Exercise per Week:   . Minutes of Exercise per Session:   Stress:   . Feeling of Stress :   Social Connections:   . Frequency of Communication with Friends and Family:   . Frequency of Social Gatherings with Friends and Family:   . Attends Religious Services:   . Active Member of Clubs or Organizations:   . Attends Archivist Meetings:   Marland Kitchen Marital Status:   Intimate Partner Violence:   . Fear of Current or Ex-Partner:   . Emotionally Abused:   Marland Kitchen Physically Abused:   . Sexually Abused:     Past Surgical History:  Procedure Laterality Date  .  no prior surgery      Family History  Problem Relation Age of Onset  . Ovarian cancer Mother   . Heart disease Father   . Hypertension Father   . Colon cancer Neg Hx     No Known Allergies  Current Outpatient Medications on File Prior to Visit  Medication Sig Dispense Refill  . aspirin 81 MG tablet Take 81 mg by mouth daily.    . colchicine 0.6 MG tablet Take 1 tablet (0.6 mg total) by mouth every other day. 45 tablet 3  . losartan (COZAAR) 50 MG tablet Take 1 tablet (50 mg total) by mouth daily. **DUE FOR YEARLY PHYSICAL** 90 tablet 3  . Multiple Vitamin (MULTIVITAMIN) tablet Take 1 tablet by mouth daily.    . rosuvastatin (CRESTOR) 5 MG tablet Take 1 tablet (5 mg total) by mouth at bedtime. 90 tablet 3   Current Facility-Administered Medications on File Prior to Visit  Medication Dose Route Frequency Provider Last Rate Last Admin  . triamcinolone acetonide (KENALOG) 10 MG/ML injection 10 mg  10 mg Other Once Stover,  Titorya, DPM        BP (!) 140/98   Pulse 88   Temp 97.7 F (36.5 C) (Other (Comment))   Ht 5\' 9"  (1.753 m)   SpO2 98%   BMI 26.14 kg/m       Objective:   Physical Exam Vitals and nursing note reviewed.  Constitutional:      Appearance: Normal appearance.  Cardiovascular:     Rate and Rhythm: Normal rate and regular rhythm.     Pulses: Normal pulses.     Heart sounds: Normal heart sounds.  Musculoskeletal:        General: Tenderness present. No swelling or deformity. Normal range of motion.     Right lower leg: No edema.     Left lower leg: No edema.     Comments: Calf pain noted.  Has tenderness to right heel as well as along the distal end of the calcaneal tendon.  Unable to bear weight on right foot  Skin:    General: Skin is warm and dry.     Findings: No erythema.  Neurological:     General: No focal deficit present.     Mental Status: He is alert and oriented to person, place, and time.  Psychiatric:        Mood and Affect: Mood normal.        Behavior: Behavior normal.        Thought Content: Thought content normal.        Judgment: Judgment normal.       Assessment & Plan:  1. Essential hypertension - Will have him increase Losartan from 50 mg to 100 mg  - Follow up in 2 weeks   2. Right foot pain -Achilles tendon intact.  Unknown cause of pain at this time.  We will start with x-ray of right foot.  A short course of tramadol for pain relief.  Advised RICE - Bone Spur vs calcified tendon vs stress fracture? - DG Foot Complete Right; Future - traMADol (ULTRAM) 50 MG tablet; Take 1 tablet (50 mg total) by mouth every 8 (eight) hours as needed for up to 5 days.  Dispense: 15 tablet; Refill: 0  Dorothyann Peng, NP

## 2019-07-18 NOTE — Patient Instructions (Signed)
Increase Losartan to 100 mg and follow up in 2 weeks   I will follow up with you regarding the xray

## 2019-07-19 ENCOUNTER — Encounter: Payer: Self-pay | Admitting: Adult Health

## 2019-07-19 ENCOUNTER — Telehealth: Payer: Self-pay | Admitting: Adult Health

## 2019-07-19 MED ORDER — NAPROXEN 500 MG PO TABS
500.0000 mg | ORAL_TABLET | Freq: Two times a day (BID) | ORAL | 0 refills | Status: DC
Start: 2019-07-19 — End: 2019-09-10

## 2019-07-19 MED ORDER — METHYLPREDNISOLONE 4 MG PO TBPK
ORAL_TABLET | ORAL | 0 refills | Status: DC
Start: 2019-07-19 — End: 2019-08-01

## 2019-07-19 MED FILL — NAPROXEN 500 MG TABS: 500 | 15 days supply | Qty: 30 | Fill #0

## 2019-07-19 MED FILL — METHYLPREDNISOLONE 4 MG TBP: 4 | 6 days supply | Qty: 21 | Fill #0

## 2019-07-19 NOTE — Telephone Encounter (Signed)
Spoke to patient and informed him of his xray which showed   IMPRESSION: Moderately severe osteoarthritic change in the first MTP joint. Small inferior calcaneal spur. No fracture or dislocation. No erosive change.  Will send in Naprosyn and prednisone dose pack to see if this help with the discomfort from the bone spur

## 2019-07-23 ENCOUNTER — Telehealth: Payer: Self-pay | Admitting: Adult Health

## 2019-07-23 NOTE — Telephone Encounter (Signed)
Pt is calling stating that Tommi Rumps asked him to call back to give him a update of his R foot it is doing much better and he is able to put pressure on the foot and is off of his crutches and the medication seems to be working for him

## 2019-08-01 ENCOUNTER — Other Ambulatory Visit: Payer: Self-pay | Admitting: Adult Health

## 2019-08-01 ENCOUNTER — Ambulatory Visit (INDEPENDENT_AMBULATORY_CARE_PROVIDER_SITE_OTHER): Payer: No Typology Code available for payment source | Admitting: Adult Health

## 2019-08-01 ENCOUNTER — Encounter: Payer: Self-pay | Admitting: Adult Health

## 2019-08-01 ENCOUNTER — Other Ambulatory Visit: Payer: Self-pay

## 2019-08-01 VITALS — BP 136/82 | Temp 98.2°F | Wt 174.0 lb

## 2019-08-01 DIAGNOSIS — I10 Essential (primary) hypertension: Secondary | ICD-10-CM | POA: Diagnosis not present

## 2019-08-01 DIAGNOSIS — E1169 Type 2 diabetes mellitus with other specified complication: Secondary | ICD-10-CM

## 2019-08-01 DIAGNOSIS — E785 Hyperlipidemia, unspecified: Secondary | ICD-10-CM

## 2019-08-01 MED ORDER — LOSARTAN POTASSIUM 100 MG PO TABS: 100.0000 mg | ORAL_TABLET | Freq: Every day | ORAL | 3 refills | Status: DC

## 2019-08-01 NOTE — Progress Notes (Signed)
Subjective:    Patient ID: Brent Aloe Sr., male    DOB: 1950/06/28, 69 y.o.   MRN: [id number]  HPI 69 year old male who  has a past medical history of Carpal tunnel syndrome, Gout, and Hypertension.   He presents to the office today for 2-week follow-up regarding essential hypertension.  During the last visit his losartan was increased from 50 to 100 mg.  Since starting the increased dose he has not had any side effects such as dizziness, lightheadedness or blurred vision. He unfortunately has not been checking his blood pressure at home.    BP Readings from Last 3 Encounters:  08/01/19 136/82  07/18/19 (!) 140/98  06/11/19 (!) 154/92    Review of Systems See HPI   Past Medical History:  Diagnosis Date  . Carpal tunnel syndrome   . Gout   . Hypertension     Social History   Socioeconomic History  . Marital status: Married    Spouse name: Not on file  . Number of children: Not on file  . Years of education: Not on file  . Highest education level: Not on file  Occupational History  . Not on file  Tobacco Use  . Smoking status: Former Smoker    Quit date: 01/17/1966    Years since quitting: 53.5  . Smokeless tobacco: Never Used  Vaping Use  . Vaping Use: Never used  Substance and Sexual Activity  . Alcohol use: Yes    Comment: occasional beer  . Drug use: No  . Sexual activity: Yes  Other Topics Concern  . 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    Married    Two children, live locally.       Three dogs and two cats    Social Determinants of Health   Financial Resource Strain:   . Difficulty of Paying Living Expenses:   Food Insecurity:   . Worried About Charity fundraiser in the Last Year:   . Arboriculturist in the Last Year:   Transportation Needs:   . Film/video editor (Medical):   Marland Kitchen Lack of Transportation (Non-Medical):   Physical Activity:   . Days of  Exercise per Week:   . Minutes of Exercise per Session:   Stress:   . Feeling of Stress :   Social Connections:   . Frequency of Communication with Friends and Family:   . Frequency of Social Gatherings with Friends and Family:   . Attends Religious Services:   . Active Member of Clubs or Organizations:   . Attends Archivist Meetings:   Marland Kitchen Marital Status:   Intimate Partner Violence:   . Fear of Current or Ex-Partner:   . Emotionally Abused:   Marland Kitchen Physically Abused:   . Sexually Abused:     Past Surgical History:  Procedure Laterality Date  . no prior surgery      Family History  Problem Relation Age of Onset  . Ovarian cancer Mother   . Heart disease Father   . Hypertension Father   . Colon cancer Neg Hx     No Known Allergies  Current Outpatient Medications on File Prior to Visit  Medication Sig Dispense Refill  . aspirin 81 MG tablet Take 81 mg by mouth daily.    . colchicine 0.6 MG tablet Take 1 tablet (0.6 mg total) by mouth every  other day. 45 tablet 3  . losartan (COZAAR) 50 MG tablet Take 1 tablet (50 mg total) by mouth daily. **DUE FOR YEARLY PHYSICAL** 90 tablet 3  . Multiple Vitamin (MULTIVITAMIN) tablet Take 1 tablet by mouth daily.    . naproxen (NAPROSYN) 500 MG tablet Take 1 tablet (500 mg total) by mouth 2 (two) times daily with a meal. 30 tablet 0  . rosuvastatin (CRESTOR) 5 MG tablet Take 1 tablet (5 mg total) by mouth at bedtime. 90 tablet 3   Current Facility-Administered Medications on File Prior to Visit  Medication Dose Route Frequency Provider Last Rate Last Admin  . triamcinolone acetonide (KENALOG) 10 MG/ML injection 10 mg  10 mg Other Once Stover, Titorya, DPM        BP 136/82   Temp 98.2 F (36.8 C)   Wt 174 lb (78.9 kg)   BMI 25.70 kg/m       Objective:   Physical Exam Vitals and nursing note reviewed.  Constitutional:      Appearance: Normal appearance.  Cardiovascular:     Rate and Rhythm: Normal rate and regular  rhythm.     Pulses: Normal pulses.     Heart sounds: Normal heart sounds.  Pulmonary:     Effort: Pulmonary effort is normal.     Breath sounds: Normal breath sounds.  Skin:    General: Skin is warm.  Neurological:     General: No focal deficit present.     Mental Status: He is alert and oriented to person, place, and time.  Psychiatric:        Mood and Affect: Mood normal.        Behavior: Behavior normal.        Thought Content: Thought content normal.        Judgment: Judgment normal.       Assessment & Plan:  1. Essential hypertension - Better controlled. Will have him monitor his BP at home and report readings back top me - losartan (COZAAR) 100 MG tablet; Take 1 tablet (100 mg total) by mouth daily.  Dispense: 90 tablet; Refill: 3  2. Hyperlipidemia associated with type 2 diabetes mellitus ([place])  - Lipid panel; Future - Comprehensive metabolic panel; Future   Dorothyann Peng, NP

## 2019-08-12 MED FILL — LOSARTAN POTASSIUM 100 MG T: 100 | 90 days supply | Qty: 90 | Fill #0

## 2019-09-10 ENCOUNTER — Ambulatory Visit (AMBULATORY_SURGERY_CENTER): Payer: Self-pay

## 2019-09-10 ENCOUNTER — Other Ambulatory Visit: Payer: Self-pay

## 2019-09-10 VITALS — Ht 69.0 in | Wt 177.0 lb

## 2019-09-10 DIAGNOSIS — Z86010 Personal history of colonic polyps: Secondary | ICD-10-CM

## 2019-09-10 MED ORDER — NA SULFATE-K SULFATE-MG SULF 17.5-3.13-1.6 GM/177ML PO SOLN: 1.0000 | Freq: Once | ORAL | 0 refills | Status: AC

## 2019-09-10 MED FILL — SUPREP BOWEL PREP KIT: 17.5-3.13-1 | 1 days supply | Qty: 354 | Fill #0

## 2019-09-10 NOTE — Progress Notes (Signed)
No egg or soy allergy known to patient  No issues with past sedation with any surgeries or procedures No intubation problems in the past  No FH of Malignant Hyperthermia No diet pills per patient No home 02 use per patient  No blood thinners per patient  Pt denies issues with constipation  No A fib or A flutter   COVID 19 guidelines implemented in [facility] today with Pt and RN  COVID vaccines completed on 03/2019 per pt;   Due to the COVID-19 pandemic we are asking patients to follow these guidelines. Please only bring one care partner. Please be aware that your care partner may wait in the car in the parking lot or if they feel like they will be too hot to wait in the car, they may wait in the lobby on the 4th floor. All care partners are required to wear a mask the entire time (we do not have any that we can provide them), they need to practice social distancing, and we will do a Covid check for all patient's and care partners when you arrive. Also we will check their temperature and your temperature. If the care partner waits in their car they need to stay in the parking lot the entire time and we will call them on their cell phone when the patient is ready for discharge so they can bring the car to the front of the building. Also all patient's will need to wear a mask into building.

## 2019-09-24 ENCOUNTER — Ambulatory Visit (AMBULATORY_SURGERY_CENTER): Payer: No Typology Code available for payment source | Admitting: Gastroenterology

## 2019-09-24 ENCOUNTER — Encounter: Payer: Self-pay | Admitting: Gastroenterology

## 2019-09-24 ENCOUNTER — Other Ambulatory Visit: Payer: Self-pay

## 2019-09-24 VITALS — BP 157/93 | HR 64 | Temp 96.8°F | Resp 18 | Ht 69.0 in | Wt 177.0 lb

## 2019-09-24 DIAGNOSIS — D120 Benign neoplasm of cecum: Secondary | ICD-10-CM

## 2019-09-24 DIAGNOSIS — D122 Benign neoplasm of ascending colon: Secondary | ICD-10-CM

## 2019-09-24 DIAGNOSIS — Z86010 Personal history of colonic polyps: Secondary | ICD-10-CM

## 2019-09-24 DIAGNOSIS — D124 Benign neoplasm of descending colon: Secondary | ICD-10-CM | POA: Diagnosis not present

## 2019-09-24 DIAGNOSIS — D123 Benign neoplasm of transverse colon: Secondary | ICD-10-CM

## 2019-09-24 MED ORDER — SODIUM CHLORIDE 0.9 % IV SOLN: 500.0000 mL | Freq: Once | INTRAVENOUS | Status: DC

## 2019-09-24 NOTE — Progress Notes (Signed)
Called to room to assist during endoscopic procedure.  Patient ID and intended procedure confirmed with present staff. Received instructions for my participation in the procedure from the performing physician.  

## 2019-09-24 NOTE — Op Note (Signed)
[place] Patient Name: Brent Mccormick Procedure Date: 09/24/2019 9:36 AM MRN: [id number] Endoscopist: Milus Banister , MD Age: 69 Referring MD:  Date of Birth: 03-29-1950 Gender: Male Account #: [id number] Procedure:                Colonoscopy Indications:              High risk colon cancer surveillance: Personal                            history of colonic polyps; Colonoscopy 2011                            adenomatous polyps (>1cm); Colonoscopy 2015 two                            subCM polyp, one was SSP Medicines:                Monitored Anesthesia Care Procedure:                Pre-Anesthesia Assessment:                           - Prior to the procedure, a History and Physical                            was performed, and patient medications and                            allergies were reviewed. The patient's tolerance of                            previous anesthesia was also reviewed. The risks                            and benefits of the procedure and the sedation                            options and risks were discussed with the patient.                            All questions were answered, and informed consent                            was obtained. Prior Anticoagulants: The patient has                            taken no previous anticoagulant or antiplatelet                            agents. ASA Grade Assessment: II - A patient with                            mild systemic disease. After reviewing the risks  and benefits, the patient was deemed in                            satisfactory condition to undergo the procedure.                           After obtaining informed consent, the colonoscope                            was passed under direct vision. Throughout the                            procedure, the patient's blood pressure, pulse, and                            oxygen saturations were monitored continuously.  The                            Colonoscope was introduced through the anus and                            advanced to the the cecum, identified by                            appendiceal orifice and ileocecal valve. The                            colonoscopy was performed without difficulty. The                            patient tolerated the procedure well. The quality                            of the bowel preparation was good. The ileocecal                            valve, appendiceal orifice, and rectum were                            photographed. Scope In: 9:42:49 AM Scope Out: 9:55:20 AM Scope Withdrawal Time: 0 hours 10 minutes 5 seconds  Total Procedure Duration: 0 hours 12 minutes 31 seconds  Findings:                 Eight sessile polyps were found in the descending                            colon, transverse colon, ascending colon and cecum.                            The polyps were 3 to 8 mm in size. These polyps                            were removed with a cold snare. Resection and  retrieval were complete.                           Multiple small and large-mouthed diverticula were                            found in the left colon.                           The exam was otherwise without abnormality on                            direct and retroflexion views. Complications:            No immediate complications. Estimated blood loss:                            None. Estimated Blood Loss:     Estimated blood loss: none. Impression:               - Eight 3 to 8 mm polyps in the descending colon,                            in the transverse colon, in the ascending colon and                            in the cecum, removed with a cold snare. Resected                            and retrieved.                           - Diverticulosis in the left colon.                           - The examination was otherwise normal on direct                             and retroflexion views. Recommendation:           - Patient has a contact number available for                            emergencies. The signs and symptoms of potential                            delayed complications were discussed with the                            patient. Return to normal activities tomorrow.                            Written discharge instructions were provided to the                            patient.                           -  Resume previous diet.                           - Continue present medications.                           - Await pathology results. Milus Banister, MD 09/24/2019 10:00:50 AM This report has been signed electronically.

## 2019-09-24 NOTE — Patient Instructions (Signed)
Please read handouts provided. Continue present medications. Await pathology results.   YOU HAD AN ENDOSCOPIC PROCEDURE TODAY AT THE [facility]:   Refer to the procedure report that was given to you for any specific questions about what was found during the examination.  If the procedure report does not answer your questions, please call your gastroenterologist to clarify.  If you requested that your care partner not be given the details of your procedure findings, then the procedure report has been included in a sealed envelope for you to review at your convenience later.  YOU SHOULD EXPECT: Some feelings of bloating in the abdomen. Passage of more gas than usual.  Walking can help get rid of the air that was put into your GI tract during the procedure and reduce the bloating. If you had a lower endoscopy (such as a colonoscopy or flexible sigmoidoscopy) you may notice spotting of blood in your stool or on the toilet paper. If you underwent a bowel prep for your procedure, you may not have a normal bowel movement for a few days.  Please Note:  You might notice some irritation and congestion in your nose or some drainage.  This is from the oxygen used during your procedure.  There is no need for concern and it should clear up in a day or so.  SYMPTOMS TO REPORT IMMEDIATELY:  Following lower endoscopy (colonoscopy or flexible sigmoidoscopy):  Excessive amounts of blood in the stool  Significant tenderness or worsening of abdominal pains  Swelling of the abdomen that is new, acute  Fever of 100F or higher   For urgent or emergent issues, a gastroenterologist can be reached at any hour by calling [phone]. Do not use MyChart messaging for urgent concerns.    DIET:  We do recommend a small meal at first, but then you may proceed to your regular diet.  Drink plenty of fluids but you should avoid alcoholic beverages for 24 hours.  ACTIVITY:  You should plan to take it easy  for the rest of today and you should NOT DRIVE or use heavy machinery until tomorrow (because of the sedation medicines used during the test).    FOLLOW UP: Our staff will call the number listed on your records 48-72 hours following your procedure to check on you and address any questions or concerns that you may have regarding the information given to you following your procedure. If we do not reach you, we will leave a message.  We will attempt to reach you two times.  During this call, we will ask if you have developed any symptoms of COVID 19. If you develop any symptoms (ie: fever, flu-like symptoms, shortness of breath, cough etc.) before then, please call [phone].  If you test positive for Covid 19 in the 2 weeks post procedure, please call and report this information to us.    If any biopsies were taken you will be contacted by phone or by letter within the next 1-3 weeks.  Please call us at [phone] if you have not heard about the biopsies in 3 weeks.    SIGNATURES/CONFIDENTIALITY: You and/or your care partner have signed paperwork which will be entered into your electronic medical record.  These signatures attest to the fact that that the information above on your After Visit Summary has been reviewed and is understood.  Full responsibility of the confidentiality of this discharge information lies with you and/or your care-partner.  

## 2019-09-24 NOTE — Progress Notes (Signed)
Pt's states no medical or surgical changes since previsit or office visit.  VS SB 

## 2019-09-24 NOTE — Progress Notes (Signed)
pt tolerated well. VSS. awake and to recovery. Report given to RN.  

## 2019-09-26 ENCOUNTER — Telehealth: Payer: Self-pay

## 2019-09-26 NOTE — Telephone Encounter (Signed)
First attempt follow up call to pt, lm on vm 

## 2019-09-26 NOTE — Telephone Encounter (Signed)
  Follow up Call-  Call back number 09/24/2019  Post procedure Call Back phone  # [phone]  Permission to leave phone message Yes  Some recent data might be hidden     Patient questions:  Do you have a fever, pain , or abdominal swelling? No. Pain Score  0 *  Have you tolerated food without any problems? Yes.    Have you been able to return to your normal activities? Yes.    Do you have any questions about your discharge instructions: Diet   No Medications  No. Follow up visit  No.  Do you have questions or concerns about your Care? No.  Actions: * If pain score is 4 or above: No action needed, pain <4.  1. Have you developed a fever since your procedure? no  2.   Have you had an respiratory symptoms (SOB or cough) since your procedure? no  3.   Have you tested positive for COVID 19 since your procedure no  4.   Have you had any family members/close contacts diagnosed with the COVID 19 since your procedure?  no   If yes to any of these questions please route to Joylene John, RN and Joella Prince, RN

## 2019-09-30 ENCOUNTER — Encounter: Payer: Self-pay | Admitting: Gastroenterology

## 2019-10-21 MED FILL — COLCHICINE 0.6 MG TABS: 0.6 | 90 days supply | Qty: 45 | Fill #1

## 2019-11-11 MED FILL — LOSARTAN POTASSIUM 100 MG T: 100 | 90 days supply | Qty: 90 | Fill #1

## 2019-11-12 NOTE — Addendum Note (Signed)
Addended by: Marrion Coy on: 11/12/2019 09:28 AM   Modules accepted: Orders

## 2019-11-19 ENCOUNTER — Other Ambulatory Visit: Payer: No Typology Code available for payment source

## 2019-12-24 MED FILL — ROSUVASTATIN CALCIUM 5 MG T: 5 | 90 days supply | Qty: 90 | Fill #1

## 2019-12-24 MED FILL — COLCHICINE 0.6 MG TABS: 0.6 | 90 days supply | Qty: 45 | Fill #2

## 2020-02-10 MED FILL — LOSARTAN POTASSIUM 100 MG T: 100 | 90 days supply | Qty: 90 | Fill #2

## 2020-02-16 ENCOUNTER — Inpatient Hospital Stay (HOSPITAL_COMMUNITY)
Admission: EM | Admit: 2020-02-16 | Discharge: 2020-02-21 | DRG: 041 | Disposition: A | Discharge status: Rehabilitation Facility | Payer: 59 | Attending: Neurology | Admitting: Neurology

## 2020-02-16 ENCOUNTER — Emergency Department (HOSPITAL_COMMUNITY): Payer: 59

## 2020-02-16 DIAGNOSIS — R29704 NIHSS score 4: Secondary | ICD-10-CM | POA: Diagnosis not present

## 2020-02-16 DIAGNOSIS — G8191 Hemiplegia, unspecified affecting right dominant side: Secondary | ICD-10-CM | POA: Diagnosis not present

## 2020-02-16 DIAGNOSIS — Z91128 Patient's intentional underdosing of medication regimen for other reason: Secondary | ICD-10-CM

## 2020-02-16 DIAGNOSIS — Z9114 Patient's other noncompliance with medication regimen: Secondary | ICD-10-CM | POA: Diagnosis not present

## 2020-02-16 DIAGNOSIS — D72829 Elevated white blood cell count, unspecified: Secondary | ICD-10-CM

## 2020-02-16 DIAGNOSIS — I69392 Facial weakness following cerebral infarction: Secondary | ICD-10-CM | POA: Diagnosis not present

## 2020-02-16 DIAGNOSIS — I69398 Other sequelae of cerebral infarction: Secondary | ICD-10-CM | POA: Diagnosis not present

## 2020-02-16 DIAGNOSIS — E119 Type 2 diabetes mellitus without complications: Secondary | ICD-10-CM | POA: Diagnosis not present

## 2020-02-16 DIAGNOSIS — Z7982 Long term (current) use of aspirin: Secondary | ICD-10-CM | POA: Diagnosis not present

## 2020-02-16 DIAGNOSIS — I10 Essential (primary) hypertension: Secondary | ICD-10-CM | POA: Diagnosis not present

## 2020-02-16 DIAGNOSIS — Z8739 Personal history of other diseases of the musculoskeletal system and connective tissue: Secondary | ICD-10-CM

## 2020-02-16 DIAGNOSIS — M10072 Idiopathic gout, left ankle and foot: Secondary | ICD-10-CM | POA: Diagnosis not present

## 2020-02-16 DIAGNOSIS — R197 Diarrhea, unspecified: Secondary | ICD-10-CM | POA: Diagnosis not present

## 2020-02-16 DIAGNOSIS — Z8249 Family history of ischemic heart disease and other diseases of the circulatory system: Secondary | ICD-10-CM | POA: Diagnosis not present

## 2020-02-16 DIAGNOSIS — R000 Tachycardia, unspecified: Secondary | ICD-10-CM

## 2020-02-16 DIAGNOSIS — R001 Bradycardia, unspecified: Secondary | ICD-10-CM | POA: Diagnosis not present

## 2020-02-16 DIAGNOSIS — T50906A Underdosing of unspecified drugs, medicaments and biological substances, initial encounter: Secondary | ICD-10-CM | POA: Diagnosis present

## 2020-02-16 DIAGNOSIS — M109 Gout, unspecified: Secondary | ICD-10-CM | POA: Diagnosis present

## 2020-02-16 DIAGNOSIS — I69318 Other symptoms and signs involving cognitive functions following cerebral infarction: Secondary | ICD-10-CM | POA: Diagnosis not present

## 2020-02-16 DIAGNOSIS — Z79899 Other long term (current) drug therapy: Secondary | ICD-10-CM

## 2020-02-16 DIAGNOSIS — R799 Abnormal finding of blood chemistry, unspecified: Secondary | ICD-10-CM | POA: Diagnosis not present

## 2020-02-16 DIAGNOSIS — I69351 Hemiplegia and hemiparesis following cerebral infarction affecting right dominant side: Secondary | ICD-10-CM | POA: Diagnosis present

## 2020-02-16 DIAGNOSIS — R29810 Facial weakness: Secondary | ICD-10-CM | POA: Diagnosis present

## 2020-02-16 DIAGNOSIS — G479 Sleep disorder, unspecified: Secondary | ICD-10-CM | POA: Diagnosis not present

## 2020-02-16 DIAGNOSIS — E785 Hyperlipidemia, unspecified: Secondary | ICD-10-CM

## 2020-02-16 DIAGNOSIS — R2689 Other abnormalities of gait and mobility: Secondary | ICD-10-CM | POA: Diagnosis present

## 2020-02-16 DIAGNOSIS — I6603 Occlusion and stenosis of bilateral middle cerebral arteries: Secondary | ICD-10-CM | POA: Diagnosis not present

## 2020-02-16 DIAGNOSIS — Z87891 Personal history of nicotine dependence: Secondary | ICD-10-CM | POA: Diagnosis not present

## 2020-02-16 DIAGNOSIS — I6381 Other cerebral infarction due to occlusion or stenosis of small artery: Secondary | ICD-10-CM | POA: Diagnosis not present

## 2020-02-16 DIAGNOSIS — I6523 Occlusion and stenosis of bilateral carotid arteries: Secondary | ICD-10-CM | POA: Diagnosis not present

## 2020-02-16 DIAGNOSIS — Z8041 Family history of malignant neoplasm of ovary: Secondary | ICD-10-CM | POA: Diagnosis not present

## 2020-02-16 DIAGNOSIS — R4781 Slurred speech: Secondary | ICD-10-CM | POA: Diagnosis not present

## 2020-02-16 DIAGNOSIS — I6623 Occlusion and stenosis of bilateral posterior cerebral arteries: Secondary | ICD-10-CM | POA: Diagnosis not present

## 2020-02-16 DIAGNOSIS — I639 Cerebral infarction, unspecified: Secondary | ICD-10-CM

## 2020-02-16 DIAGNOSIS — Z8371 Family history of colonic polyps: Secondary | ICD-10-CM | POA: Diagnosis not present

## 2020-02-16 HISTORY — DX: Cerebral infarction, unspecified: I63.9

## 2020-02-16 LAB — COMPREHENSIVE METABOLIC PANEL
ALT: 21 U/L (ref 0–44)
AST: 22 U/L (ref 15–41)
Albumin: 4.4 g/dL (ref 3.5–5.0)
Alkaline Phosphatase: 56 U/L (ref 38–126)
Anion gap: 11 (ref 5–15)
BUN: 24 mg/dL — ABNORMAL HIGH (ref 8–23)
CO2: 23 mmol/L (ref 22–32)
Calcium: 9.7 mg/dL (ref 8.9–10.3)
Chloride: 103 mmol/L (ref 98–111)
Creatinine, Ser: 1.41 mg/dL — ABNORMAL HIGH (ref 0.61–1.24)
GFR, Estimated: 53 mL/min — ABNORMAL LOW (ref >60)
Glucose, Bld: 134 mg/dL — ABNORMAL HIGH (ref 70–99)
Potassium: 3.9 mmol/L (ref 3.5–5.1)
Sodium: 137 mmol/L (ref 135–145)
Total Bilirubin: 0.5 mg/dL (ref 0.3–1.2)
Total Protein: 7.8 g/dL (ref 6.5–8.1)

## 2020-02-16 LAB — CBG MONITORING, ED: Glucose-Capillary: 136 mg/dL — ABNORMAL HIGH (ref 70–99)

## 2020-02-16 LAB — I-STAT CHEM 8, ED
BUN: 29 mg/dL — ABNORMAL HIGH (ref 8–23)
Calcium, Ion: 1.07 mmol/L — ABNORMAL LOW (ref 1.15–1.40)
Chloride: 105 mmol/L (ref 98–111)
Creatinine, Ser: 1.3 mg/dL — ABNORMAL HIGH (ref 0.61–1.24)
Glucose, Bld: 128 mg/dL — ABNORMAL HIGH (ref 70–99)
HCT: 48 % (ref 39.0–52.0)
Hemoglobin: 16.3 g/dL (ref 13.0–17.0)
Potassium: 3.9 mmol/L (ref 3.5–5.1)
Sodium: 139 mmol/L (ref 135–145)
TCO2: 24 mmol/L (ref 22–32)

## 2020-02-16 LAB — CBC
HCT: 48.9 % (ref 39.0–52.0)
Hemoglobin: 16 g/dL (ref 13.0–17.0)
MCH: 29.7 pg (ref 26.0–34.0)
MCHC: 32.7 g/dL (ref 30.0–36.0)
MCV: 90.7 fL (ref 80.0–100.0)
Platelets: 265 10*3/uL (ref 150–400)
RBC: 5.39 MIL/uL (ref 4.22–5.81)
RDW: 12.7 % (ref 11.5–15.5)
WBC: 10.8 10*3/uL — ABNORMAL HIGH (ref 4.0–10.5)
nRBC: 0 % (ref 0.0–0.2)

## 2020-02-16 LAB — DIFFERENTIAL
Abs Immature Granulocytes: 0.02 10*3/uL (ref 0.00–0.07)
Basophils Absolute: 0.1 10*3/uL (ref 0.0–0.1)
Basophils Relative: 1 %
Eosinophils Absolute: 0.1 10*3/uL (ref 0.0–0.5)
Eosinophils Relative: 1 %
Immature Granulocytes: 0 %
Lymphocytes Relative: 24 %
Lymphs Abs: 2.6 10*3/uL (ref 0.7–4.0)
Monocytes Absolute: 0.7 10*3/uL (ref 0.1–1.0)
Monocytes Relative: 7 %
Neutro Abs: 7.2 10*3/uL (ref 1.7–7.7)
Neutrophils Relative %: 67 %

## 2020-02-16 LAB — PROTIME-INR
INR: 1 (ref 0.8–1.2)
Prothrombin Time: 12.7 seconds (ref 11.4–15.2)

## 2020-02-16 LAB — MRSA PCR SCREENING: MRSA by PCR: NEGATIVE

## 2020-02-16 LAB — APTT: aPTT: 21 seconds — ABNORMAL LOW (ref 24–36)

## 2020-02-16 MED ORDER — ACETAMINOPHEN 160 MG/5ML PO SOLN: 650.0000 mg | ORAL | Status: DC | PRN

## 2020-02-16 MED ORDER — IOHEXOL 350 MG/ML SOLN
60.0000 mL | Freq: Once | INTRAVENOUS | Status: AC | PRN
  Administered 2020-02-16: 60 mL via INTRAVENOUS

## 2020-02-16 MED ORDER — LABETALOL HCL 5 MG/ML IV SOLN
5.0000 mg | INTRAVENOUS | Status: DC | PRN
  Administered 2020-02-16: 5 mg via INTRAVENOUS

## 2020-02-16 MED ORDER — SENNOSIDES-DOCUSATE SODIUM 8.6-50 MG PO TABS: 1.0000 | ORAL_TABLET | Freq: Every evening | ORAL | Status: DC | PRN

## 2020-02-16 MED ORDER — CHLORHEXIDINE GLUCONATE CLOTH 2 % EX PADS
6.0000 | MEDICATED_PAD | Freq: Every day | CUTANEOUS | Status: DC
  Administered 2020-02-16 – 2020-02-21 (×6): 6 via TOPICAL

## 2020-02-16 MED ORDER — ALTEPLASE (STROKE) FULL DOSE INFUSION
0.9000 mg/kg | Freq: Once | INTRAVENOUS | Status: AC
  Administered 2020-02-16: 70.5 mg via INTRAVENOUS
  Filled 2020-02-16: qty 100

## 2020-02-16 MED ORDER — ACETAMINOPHEN 325 MG PO TABS
650.0000 mg | ORAL_TABLET | ORAL | Status: DC | PRN
  Administered 2020-02-19 – 2020-02-21 (×4): 650 mg via ORAL
  Filled 2020-02-16 (×4): qty 2

## 2020-02-16 MED ORDER — LABETALOL HCL 5 MG/ML IV SOLN
10.0000 mg | INTRAVENOUS | Status: DC | PRN
  Administered 2020-02-16 – 2020-02-19 (×4): 10 mg via INTRAVENOUS
  Filled 2020-02-16 (×5): qty 4

## 2020-02-16 MED ORDER — SODIUM CHLORIDE 0.9 % IV SOLN
50.0000 mL | Freq: Once | INTRAVENOUS | Status: AC
  Administered 2020-02-16: 50 mL via INTRAVENOUS

## 2020-02-16 MED ORDER — SODIUM CHLORIDE 0.9 % IV SOLN: INTRAVENOUS | Status: DC

## 2020-02-16 MED ORDER — SODIUM CHLORIDE 0.9% FLUSH
3.0000 mL | Freq: Once | INTRAVENOUS | Status: DC
Start: 2020-02-16 — End: 2020-02-21

## 2020-02-16 MED ORDER — ACETAMINOPHEN 650 MG RE SUPP: 650.0000 mg | RECTAL | Status: DC | PRN

## 2020-02-16 MED ORDER — PANTOPRAZOLE SODIUM 40 MG IV SOLR
40.0000 mg | Freq: Every day | INTRAVENOUS | Status: DC
  Administered 2020-02-16 – 2020-02-18 (×3): 40 mg via INTRAVENOUS
  Filled 2020-02-16 (×3): qty 40

## 2020-02-16 MED ORDER — STROKE: EARLY STAGES OF RECOVERY BOOK
Freq: Once | Status: AC
  Filled 2020-02-16: qty 1

## 2020-02-16 NOTE — H&P (Signed)
Neurology H&P Requesting Physician: Daleen Bo   CC: Right sided weakness   History is obtained from: Patient, chart review and wife at bedside  HPI: Brent Mccormick. is a 70 y.o. male with past medical history significant for hypertension, hyperlipidemia, diabetes, gout and former smoking (quit in 1968).  He was in his usual state of health today when he began to have some right arm and leg weakness as well as a right facial droop.  He took 4 baby aspirin and his son activated EMS.  His wife is an ED nurse here and met [place] on his arrival.   Patient and wife both report that he has been in his usual state of health, no fevers, chills, cough, cold.  No urinary bleeding.  No bleeding in his stool.  No bloody emesis.  No recent procedures.  No prior history of intracerebral hemorrhage, aneurysms, significant trauma.  No prior history of stroke.  LKW: 5:10 PM tPA given?: Yes IA performed?: No Premorbid modified rankin scale:      0 - No symptoms.  ROS: A 14 point ROS was performed and is negative except as noted in the HPI.  Past Medical History:  Diagnosis Date  . Carpal tunnel syndrome   . Gout   . Hyperlipidemia    on meds  . Hypertension    on meds   Past Surgical History:  Procedure Laterality Date  . COLONOSCOPY  2015   DJ-MAC-polyps  . no prior surgery    . POLYPECTOMY  2015   DJ-MAC-polyps   To be verified by Pharmacy: Current Outpatient Medications  Medication Instructions  . aspirin 81 mg, Daily  . colchicine 0.6 mg, Oral, Every other day  . losartan (COZAAR) 100 mg, Oral, Daily  . Multiple Vitamin (MULTIVITAMIN) tablet 1 tablet, Daily  . rosuvastatin (CRESTOR) 5 mg, Oral, Daily at bedtime   Family History  Problem Relation Age of Onset  . Ovarian cancer Mother   . Heart disease Father   . Hypertension Father   . Colon polyps Sister 83  . Colon cancer Neg Hx   . Esophageal cancer Neg Hx   . Rectal cancer Neg Hx   . Stomach cancer Neg Hx     Social  History:  reports that he quit smoking about 54 years ago. He has never used smokeless tobacco. He reports current alcohol use. He reports that he does not use drugs.   Exam: Current vital signs: Wt 78.3 kg   BMI 25.49 kg/m  Vital signs in last 24 hours: Weight:  [78.3 kg] 78.3 kg (01/30 1800)   Physical Exam  Constitutional: Appears well-developed and well-nourished.  Psych: Affect appropriate to situation Eyes: No scleral injection HENT: No OP obstruction, good dentition MSK: no joint deformities.  Cardiovascular: Normal rate and regular rhythm.  Respiratory: Effort normal, non-labored breathing GI: Soft.  No distension. There is no tenderness.  Skin: WDI  Neuro: Mental Status: Patient is awake, alert, oriented to person, place, month, year, and situation. Patient is able to give a clear and coherent history.  No signs of aphasia or neglect Cranial Nerves: II: Visual Fields are full. Pupils are slightly unequal left 4 to 2 mm, right 2.5 to 1 mm III,IV, VI: EOMI without ptosis or diploplia or nystagmus V: Facial sensation is symmetric to light touch VII: Facial movement is notable for mild right facial droop VIII: hearing is intact to voice X: Uvula elevates symmetrically XI: Shoulder shrug is symmetric. XII: tongue is midline  without atrophy or fasciculations.  Motor: Tone is normal. Bulk is normal. Stuttering strength in the RUE, at times grip 4/5, at worst grip 0/5, always antigravity at the shoulder with some drift. RLE 4/5 throughout, slightly worse at the hip (4-). Left side 5/5 Sensory: Sensation is symmetric to light touch in arms and legs without neglect Deep Tendon Reflexes: 2+ and symmetric in the biceps and patellae.  Plantars: Toes are mute bilaterally.  Cerebellar: FNF and HKS are intact bilaterall  NIHSS total 3 Score breakdown: One-point right face weakness One-point right arm weakness One-point right leg weakness   I have reviewed labs in epic  and the results pertinent to this consultation are: Creatinine 1.3 Platelets 265  I have reviewed the images obtained: Head CT without acute intracranial abnormality, chronic lacunar stroke of the right   CTA without large vessel occlusion although there some intracranial atheroscelrosis  Impression: This is a 70 year old gentleman with vascular receptors as above presenting with a likely left lacunar stroke, etiology most likely small vessel disease, given TPA due to disabling deficit of right hand weakness in a right-handed man, as well as potential for progression of a stuttering lacunar stroke.   Recommendations:  # Clinically determined right lacunar stroke - Stroke labs HgbA1c, fasting lipid panel - MRI brain 24 hours post tPA - CTA completed as above - Frequent neuro checks - Echocardiogram - Carotid dopplers - Hold on ASA until 24 hours post tPA - Consider Plavix 300 mg load with 75 mg daily for 21 - 90 day course 24 hours post tPA if patient and scans stable - Risk factor modification - Telemetry monitoring; 30 day event monitor on discharge if no arrythmias captured  - Blood pressure goal   - Post tPA for 24  hours < 180/105  - Post successful uncomplicated revascularization SBP 120 - 140 for 24 hours; if complications have arisen or only partial revascularization reach out to interventionalist or neurologist on call for BP goal - PT consult, OT consult, Speech consult, unless patient returns to baseline - Stroke team to follow   [place] [phone]

## 2020-02-16 NOTE — Progress Notes (Signed)
PHARMACIST CODE STROKE RESPONSE  Notified to mix tPA at 1806 by Dr. Curly Shores Delivered tPA to RN at 1810  tPA dose = 7.1mg  bolus over 1 minute followed by 63.4mg  for a total dose of 70.5 mg over 1 hour  Issues/delays encountered (if applicable): BP control   Duanne Limerick PharmD. BCPS  02/16/20 6:15 PM

## 2020-02-16 NOTE — Progress Notes (Signed)
Pt complains that he feels the urge to urinate but is finding it more difficult than usual as he has to strain. Bladder scanner revealed pt was retaining 350 ml of urine. RN attempted to intermittent straight cath patient but was unsuccessful due to meeting resistance. Will continue to monitor.

## 2020-02-16 NOTE — Progress Notes (Signed)
Patient re-examined at 8:20 PM. He has intact speech comprehension and expression but symptomatically feels as though he is having some difficulty speaking relative to his baseline. No visual field cut. PERRL. EOMI. Subtle right facial droop. RUE and RLE weakness rated as 4/5. The patient states his symptoms are unchanged from last neuro check.   Discussed possible enrollment in [facility] trial with decreased frequency of neuro checks for select patients after tPA administration. The patient states that he would like his neuro checks to be per standard protocol and declines enrollment in the [employer] trial.   Electronically signed: Dr. Kerney Elbe

## 2020-02-16 NOTE — ED Provider Notes (Incomplete)
[facility] EMERGENCY DEPARTMENT Provider Note   CSN: [id number] Arrival date & time: 02/16/20  1749     History No chief complaint on file.   Brent Aloe Sr. is a 70 y.o. male.  70 y.o male with a PMH of HTN, hyperlipidemia presents to the ED via EMS with a chief complaint of   The history is provided by medical records.       Past Medical History:  Diagnosis Date  . Carpal tunnel syndrome   . Gout   . Hyperlipidemia    on meds  . Hypertension    on meds    Patient Active Problem List   Diagnosis Date Noted  . Chronic hepatitis C without hepatic coma ([place]) 01/20/2016  . Lumbar disc disease with radiculopathy 03/07/2012  . Essential hypertension 12/14/2009  . RHINITIS 03/26/2008  . Gout 01/15/2007  . ERECTILE DYSFUNCTION 01/15/2007  . CARPAL TUNNEL SYNDROME, RIGHT 01/15/2007    Past Surgical History:  Procedure Laterality Date  . COLONOSCOPY  2015   DJ-MAC-polyps  . no prior surgery    . POLYPECTOMY  2015   DJ-MAC-polyps       Family History  Problem Relation Age of Onset  . Ovarian cancer Mother   . Heart disease Father   . Hypertension Father   . Colon polyps Sister 51  . Colon cancer Neg Hx   . Esophageal cancer Neg Hx   . Rectal cancer Neg Hx   . Stomach cancer Neg Hx     Social History   Tobacco Use  . Smoking status: Former Smoker    Quit date: 01/17/1966    Years since quitting: 54.1  . Smokeless tobacco: Never Used  Vaping Use  . Vaping Use: Never used  Substance Use Topics  . Alcohol use: Yes    Comment: occassionally  . Drug use: No    Home Medications Prior to Admission medications   Medication Sig Start Date End Date Taking? Authorizing Provider  aspirin 81 MG tablet Take 81 mg by mouth daily.    [provider]  colchicine 0.6 MG tablet Take 1 tablet (0.6 mg total) by mouth every other day. 06/11/19 09/24/19  Nafziger, Tommi Rumps, NP  losartan (COZAAR) 100 MG tablet Take 1 tablet (100 mg total) by  mouth daily. 08/01/19 10/30/19  Nafziger, Tommi Rumps, NP  Multiple Vitamin (MULTIVITAMIN) tablet Take 1 tablet by mouth daily.    [provider]  rosuvastatin (CRESTOR) 5 MG tablet Take 1 tablet (5 mg total) by mouth at bedtime. 06/12/19   Dorothyann Peng, NP    Allergies    Patient has no known allergies.  Review of Systems   Review of Systems  Physical Exam Updated Vital Signs There were no vitals taken for this visit.  Physical Exam  ED Results / Procedures / Treatments   Labs (all labs ordered are listed, but only abnormal results are displayed) Labs Reviewed  CBG MONITORING, ED - Abnormal; Notable for the following components:      Result Value   Glucose-Capillary 136 (*)    All other components within normal limits  PROTIME-INR  APTT  CBC  DIFFERENTIAL  COMPREHENSIVE METABOLIC PANEL  I-STAT CHEM 8, ED    EKG None  Radiology No results found.  Procedures Procedures {Remember to document critical care time when appropriate:1}  Medications Ordered in ED Medications  sodium chloride flush (NS) 0.9 % injection 3 mL (has no administration in time range)    ED Course  I have reviewed the triage vital signs and the nursing notes.  Pertinent labs & imaging results that were available during my care of the patient were reviewed by me and considered in my medical decision making (see chart for details).    MDM Rules/Calculators/A&P                          *** Final Clinical Impression(s) / ED Diagnoses Final diagnoses:  None    Rx / DC Orders ED Discharge Orders    None

## 2020-02-16 NOTE — ED Notes (Signed)
5mg labetalol given

## 2020-02-16 NOTE — ED Triage Notes (Signed)
Pt arrives by EMS with complaints of right sided facial droop, slurred speech and right arm weakness.  LSN 1710  BP 200/100 HR 118 96%

## 2020-02-17 ENCOUNTER — Inpatient Hospital Stay (HOSPITAL_COMMUNITY): Payer: 59

## 2020-02-17 DIAGNOSIS — I6389 Other cerebral infarction: Secondary | ICD-10-CM

## 2020-02-17 DIAGNOSIS — I6381 Other cerebral infarction due to occlusion or stenosis of small artery: Secondary | ICD-10-CM | POA: Diagnosis not present

## 2020-02-17 LAB — RAPID URINE DRUG SCREEN, HOSP PERFORMED
Amphetamines: NOT DETECTED
Barbiturates: NOT DETECTED
Benzodiazepines: NOT DETECTED
Cocaine: NOT DETECTED
Opiates: NOT DETECTED
Tetrahydrocannabinol: NOT DETECTED

## 2020-02-17 LAB — HEMOGLOBIN A1C
Hgb A1c MFr Bld: 5.5 % (ref 4.8–5.6)
Mean Plasma Glucose: 111.15 mg/dL

## 2020-02-17 LAB — ECHOCARDIOGRAM COMPLETE
AR max vel: 2.87 cm2
AV Area VTI: 2.89 cm2
AV Area mean vel: 2.63 cm2
AV Mean grad: 3 mmHg
AV Peak grad: 4.7 mmHg
Ao pk vel: 1.08 m/s
Area-P 1/2: 2.8 cm2
MV VTI: 2.52 cm2
S' Lateral: 2.4 cm
Weight: 2765.45 oz

## 2020-02-17 LAB — LIPID PANEL
Cholesterol: 174 mg/dL (ref 0–200)
HDL: 40 mg/dL — ABNORMAL LOW (ref >40)
LDL Cholesterol: 121 mg/dL — ABNORMAL HIGH (ref 0–99)
Total CHOL/HDL Ratio: 4.4 RATIO
Triglycerides: 64 mg/dL (ref <150)
VLDL: 13 mg/dL (ref 0–40)

## 2020-02-17 MED ORDER — ROSUVASTATIN CALCIUM 20 MG PO TABS
20.0000 mg | ORAL_TABLET | Freq: Every day | ORAL | Status: DC
  Administered 2020-02-17 – 2020-02-21 (×5): 20 mg via ORAL
  Filled 2020-02-17 (×5): qty 1

## 2020-02-17 MED ORDER — VITAMIN D 25 MCG (1000 UNIT) PO TABS
1000.0000 [IU] | ORAL_TABLET | Freq: Every day | ORAL | Status: DC
  Administered 2020-02-17 – 2020-02-21 (×5): 1000 [IU] via ORAL
  Filled 2020-02-17 (×5): qty 1

## 2020-02-17 MED ORDER — COLCHICINE 0.6 MG PO TABS
0.6000 mg | ORAL_TABLET | ORAL | Status: DC
  Administered 2020-02-17 – 2020-02-21 (×3): 0.6 mg via ORAL
  Filled 2020-02-17 (×3): qty 1

## 2020-02-17 MED ORDER — CLOPIDOGREL BISULFATE 75 MG PO TABS
75.0000 mg | ORAL_TABLET | Freq: Every day | ORAL | Status: DC
  Administered 2020-02-17 – 2020-02-21 (×5): 75 mg via ORAL
  Filled 2020-02-17 (×5): qty 1

## 2020-02-17 MED ORDER — ASPIRIN EC 81 MG PO TBEC
81.0000 mg | DELAYED_RELEASE_TABLET | Freq: Every day | ORAL | Status: DC
  Administered 2020-02-17 – 2020-02-21 (×5): 81 mg via ORAL
  Filled 2020-02-17 (×5): qty 1

## 2020-02-17 NOTE — Evaluation (Signed)
Physical Therapy Evaluation Patient Details Name: Brent Melvin Sr. MRN: [id number] DOB: [birth year] Today's Date: 02/17/2020   History of Present Illness  The pt is a 70 yo male presenting with R-sided arm and leg weakness and R-sided facial droop. CT revealed no acute infarct, awaiting MRI, suspected L-sided lacunar infarct. tPA given 1810 1/30. PMH includes: HTN, HLD, DM, gout, and former smoker.    Clinical Impression  Pt in bed upon arrival of PT, agreeable to evaluation at this time. Prior to admission the pt was completely independent, driving, and working with furniture. The pt now presents with limitations in functional mobility, strength, power, and stability due to above dx, and will continue to benefit from skilled PT to address these deficits. The pt was able to demo good initiation of movement with his LLE, but required assist to move against any resistance and to complete full ROM. The pt was able to complete multiple sit-stand transfers with HHA of 1, as well as initiate lateral stepping with modA to facilitate wt shift and to block R knee to reduce buckling. The pt presents with good RUE grip strength and elbow extension, but was unable to demo active extension at fingers or wrist or flexion at elbow. Given current deficits, recommend CIR therapies prior to d/c with good family support.      Follow Up Recommendations CIR    Equipment Recommendations   (defer to post acute)    Recommendations for Other Services Rehab consult     Precautions / Restrictions Precautions Precautions: Fall Precaution Comments: R-sided weakness Restrictions Weight Bearing Restrictions: No      Mobility  Bed Mobility Overal bed mobility: Needs Assistance Bed Mobility: Rolling;Supine to Sit;Sit to Supine Rolling: Min assist   Supine to sit: Mod assist Sit to supine: Mod assist   General bed mobility comments: minA to complete roll to L side and to manage RUE. modA to RLE and to complete  elvation of trunk from [facility]. pt able to manage some scooting and repositioning with L extremities    Transfers Overall transfer level: Needs assistance Equipment used: 1 person hand held assist Transfers: Sit to/from Stand Sit to Stand: Mod assist         General transfer comment: modA to initiate stand and to block R knee with power up to standing. modA to maintain static stand without UE support  Ambulation/Gait Ambulation/Gait assistance: Mod assist Gait Distance (Feet): 3 Feet Assistive device: 1 person hand held assist Gait Pattern/deviations: Step-to pattern;Decreased dorsiflexion - right;Decreased stride length;Decreased weight shift to right Gait velocity: decreased   General Gait Details: pt able to take small lateral steps with modA to facilitate wt shift with blocking of R knee to allow for small lateral movement. poor clearance bilaterally with some reliance on heel-toe movement to advance LLE  Stairs            Wheelchair Mobility    Modified Rankin (Stroke Patients Only) Modified Rankin (Stroke Patients Only) Pre-Morbid Rankin Score: No symptoms Modified Rankin: Moderately severe disability     Balance Overall balance assessment: Needs assistance Sitting-balance support: Single extremity supported Sitting balance-Leahy Scale: Poor Sitting balance - Comments: able to sustain for short periods without UE suppor or assist, prefers single UE support   Standing balance support: Single extremity supported Standing balance-Leahy Scale: Poor Standing balance comment: modA through HHA and support from PT to maintain stability and R knee extension  Pertinent Vitals/Pain Pain Assessment: No/denies pain    Home Living Family/patient expects to be discharged to:: Private residence Living Arrangements: Spouse/significant other;Children (wife is Therapist, sports at [employer], adult son) Available Help at Discharge: Family;Available 24  hours/day (with multiple family members) Type of Home: House Home Access: Stairs to enter Entrance Stairs-Rails: Can reach both Entrance Stairs-Number of Steps: 5 or 8 Home Layout: One level Home Equipment: Shower seat - built in;Grab bars - tub/shower;Crutches      Prior Function Level of Independence: Independent         Comments: works from [employer]. enjoys reading historical non-fiction. Wife is RN at [employer], can be with pt 24/7 tues-fri and other family can be available to cover other days     Hand Dominance   Dominant Hand: Right    Extremity/Trunk Assessment   Upper Extremity Assessment Upper Extremity Assessment: Defer to OT evaluation    Lower Extremity Assessment Lower Extremity Assessment: RLE deficits/detail RLE Deficits / Details: pt reports intact sensation. 3+/5 at ankle DF, 3/5 knee extension, 3/5 after multiple attempts knee flexion RLE Sensation: WNL (pt denies difference in sensation) RLE Coordination: WNL    Cervical / Trunk Assessment Cervical / Trunk Assessment: Normal  Communication   Communication: No difficulties  Cognition Arousal/Alertness: Awake/alert Behavior During Therapy: WFL for tasks assessed/performed Overall Cognitive Status: Within Functional Limits for tasks assessed                                 General Comments: pt with generally functional cog through session, followed all commands or expressed inability. benefits from encouragement of use of RUE      General Comments General comments (skin integrity, edema, etc.): VSS on RA, wife present and supportive    Exercises     Assessment/Plan    PT Assessment Patient needs continued PT services  PT Problem List Decreased strength;Decreased range of motion;Decreased balance;Decreased mobility;Decreased coordination       PT Treatment Interventions DME instruction;Gait training;Stair training;Functional mobility training;Therapeutic  activities;Balance training;Therapeutic exercise;Neuromuscular re-education;Patient/family education    PT Goals (Current goals can be found in the Care Plan section)  Acute Rehab PT Goals Patient Stated Goal: return home with independence PT Goal Formulation: With patient Time For Goal Achievement: 03/02/20 Potential to Achieve Goals: Good    Frequency Min 4X/week    AM-PAC PT "6 Clicks" Mobility  Outcome Measure Help needed turning from your back to your side while in a flat bed without using bedrails?: A Little Help needed moving from lying on your back to sitting on the side of a flat bed without using bedrails?: A Lot Help needed moving to and from a bed to a chair (including a wheelchair)?: A Lot Help needed standing up from a chair using your arms (e.g., wheelchair or bedside chair)?: A Lot Help needed to walk in hospital room?: Total Help needed climbing 3-5 steps with a railing? : Total 6 Click Score: 11    End of Session Equipment Utilized During Treatment: Gait belt Activity Tolerance: Patient tolerated treatment well (limited by arrival of transport for MRI) Patient left: in bed;with nursing/sitter in room;with family/visitor present (transport to take pt tp MRI) Nurse Communication: Mobility status PT Visit Diagnosis: Hemiplegia and hemiparesis;Difficulty in walking, not elsewhere classified (R26.2) Hemiplegia - Right/Left: Right Hemiplegia - dominant/non-dominant: Dominant Hemiplegia - caused by: Cerebral infarction    Time: 2355-7322 PT Time Calculation (min) (ACUTE ONLY): 40  min   Charges:   PT Evaluation $PT Eval Moderate Complexity: 1 Mod PT Treatments $Gait Training: 8-22 mins $Therapeutic Activity: 8-22 mins        Brent Mccormick, PT, DPT   Acute Rehabilitation Department Pager #: [phone]  Brent Mccormick 02/17/2020, 12:19 PM

## 2020-02-17 NOTE — Consult Note (Signed)
Physical Medicine and Rehabilitation Consult Reason for Consult: Right side weakness Referring Physician: [facility]   HPI: Brent Caraher Sr. is a 70 y.o. right-handed male with history of gout, type 2 diabetes mellitus, hyperlipidemia, hypertension, quit smoking 54 years ago.  Per chart review patient lives with spouse.  Wife is an Therapist, sports.  1 level home 5 steps to entry.  Independent prior to admission and active.  Presented 02/16/2020 with right side weakness and facial droop.  Cranial CT scan negative for acute changes.  Chronic infarct right corona radiata.  CT angiogram of head and neck negative for large vessel occlusion.  Patient did  receive TPA.  MRI showed acute infarction left basal ganglia and radiating white matter tracts.  No mass-effect or hemorrhage.  Echocardiogram with ejection fraction of 60 to 65% no wall motion abnormalities grade 1 diastolic dysfunction..  Admission chemistries unremarkable except BUN 24 creatinine 1.41, WBC 10,800.  Neurology consulted and currently maintained on aspirin 81 mg daily and Plavix 75 mg day x3 weeks then Plavix alone.  Therapy evaluations completed due to right-sided weakness recommendations were for physical medicine rehab consult.  Pt reports LBM Sunday- before admission. Denies constipation. Urinating well- denies any pain, including HA's.   Review of Systems  Constitutional: Negative for chills and fever.  HENT: Negative for hearing loss.   Eyes: Negative for blurred vision and double vision.  Respiratory: Negative for cough and shortness of breath.   Cardiovascular: Negative for chest pain, palpitations and leg swelling.  Gastrointestinal: Positive for constipation. Negative for heartburn, nausea and vomiting.  Genitourinary: Negative for dysuria, flank pain and hematuria.  Musculoskeletal: Positive for joint pain and myalgias.  Skin: Negative for rash.  Neurological: Positive for weakness.  All other systems reviewed and are  negative.  Past Medical History:  Diagnosis Date  . Carpal tunnel syndrome   . Gout   . Hyperlipidemia    on meds  . Hypertension    on meds   Past Surgical History:  Procedure Laterality Date  . COLONOSCOPY  2015   DJ-MAC-polyps  . no prior surgery    . POLYPECTOMY  2015   DJ-MAC-polyps   Family History  Problem Relation Age of Onset  . Ovarian cancer Mother   . Heart disease Father   . Hypertension Father   . Colon polyps Sister 40  . Colon cancer Neg Hx   . Esophageal cancer Neg Hx   . Rectal cancer Neg Hx   . Stomach cancer Neg Hx    Social History:  reports that he quit smoking about 54 years ago. He has never used smokeless tobacco. He reports current alcohol use. He reports that he does not use drugs. Allergies: No Known Allergies Facility-Administered Medications Prior to Admission  Medication Dose Route Frequency Provider Last Rate Last Admin  . triamcinolone acetonide (KENALOG) 10 MG/ML injection 10 mg  10 mg Other Once Landis Martins, DPM       Medications Prior to Admission  Medication Sig Dispense Refill  . Ascorbic Acid (VITAMIN C) 1000 MG tablet Take 1,000 mg by mouth daily.    Marland Kitchen aspirin 81 MG tablet Take 81 mg by mouth daily.    . cholecalciferol (VITAMIN D3) 25 MCG (1000 UNIT) tablet Take 1,000 Units by mouth daily.    . colchicine 0.6 MG tablet Take 1 tablet (0.6 mg total) by mouth every other day. 45 tablet 3  . losartan (COZAAR) 100 MG tablet Take 1 tablet (100 mg  total) by mouth daily. 90 tablet 3  . Multiple Vitamin (MULTIVITAMIN) tablet Take 1 tablet by mouth daily.    . rosuvastatin (CRESTOR) 5 MG tablet Take 1 tablet (5 mg total) by mouth at bedtime. 90 tablet 3  . zinc gluconate 50 MG tablet Take 50 mg by mouth daily.      Home: Home Living Family/patient expects to be discharged to:: Private residence Living Arrangements: Spouse/significant other,Children Available Help at Discharge: Family,Available 24 hours/day Type of Home:  House Home Access: Stairs to enter [employer] of Steps: 5 or 8 Entrance Stairs-Rails: Can reach both Home Layout: One level Bathroom Shower/Tub: Multimedia programmer: Standard Home Equipment: Shower seat - built in,Grab bars - tub/shower,Crutches,Hand held shower head Additional Comments: son that is 18 yo that works but lives in the home. 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 Wife is RN at [employer], can be with pt 24/7 tues-fri and other family can be available to cover other days Functional Status:  Mobility: Bed Mobility Overal bed mobility: Needs Assistance Bed Mobility: Rolling,Supine to Sit,Sit to Supine Rolling: Min assist Supine to sit: Mod assist Sit to supine: Mod assist General bed mobility comments: minA to complete roll to L side and to manage RUE. modA to RLE and to complete elvation of trunk from [facility]. pt able to manage some scooting and repositioning with L extremities Transfers Overall transfer level: Needs assistance Equipment used: 1 person hand held assist Transfers: Sit to/from Stand Sit to Stand: Mod assist General transfer comment: modA to initiate stand and to block R knee with power up to standing. modA to maintain static stand without UE support Ambulation/Gait Ambulation/Gait assistance: Mod assist Gait Distance (Feet): 3 Feet Assistive device: 1 person hand held assist Gait Pattern/deviations: Step-to pattern,Decreased dorsiflexion - right,Decreased stride length,Decreased weight shift to right General Gait Details: pt able to take small lateral steps with modA to facilitate wt shift with blocking of R knee to allow for small lateral movement. poor clearance bilaterally with some reliance on heel-toe movement to advance LLE Gait velocity: decreased    ADL: ADL Overall ADL's : Needs  assistance/impaired  Cognition: Cognition Overall Cognitive Status: Within Functional Limits for tasks assessed Orientation Level: Oriented X4 Cognition Arousal/Alertness: Awake/alert Behavior During Therapy: WFL for tasks assessed/performed Overall Cognitive Status: Within Functional Limits for tasks assessed General Comments: pt with generally functional cog through session, followed all commands or expressed inability. benefits from encouragement of use of RUE  Blood pressure (!) 150/95, pulse 80, temperature 99.2 F (37.3 C), temperature source Oral, resp. rate 18, weight 78.4 kg, SpO2 98 %. Physical Exam Vitals and nursing note reviewed. Exam conducted with a chaperone present.  Constitutional:      Appearance: Normal appearance. He is normal weight.     Comments: Awake, alert, appropriate, up in bedside chair, wife, who's a L&D nurse at bedside, NAD  HENT:     Head: Normocephalic and atraumatic.     Comments: R facial droop, but improves with smile Tongue midline    Right Ear: External ear normal.     Left Ear: External ear normal.     Nose: Nose normal. No congestion.     Mouth/Throat:     Mouth: Mucous membranes are dry.     Pharynx: Oropharynx is clear. No oropharyngeal exudate.  Eyes:     General:  Right eye: No discharge.        Left eye: No discharge.     Extraocular Movements: Extraocular movements intact.     Conjunctiva/sclera: Conjunctivae normal.     Comments: No nystagmus B/L  Cardiovascular:     Rate and Rhythm: Normal rate and regular rhythm.     Heart sounds: Normal heart sounds. No murmur heard.   Pulmonary:     Comments: CTA B/L- no W/R/R- good air movement Abdominal:     Comments: Soft, NT, ND, (+)BS   Musculoskeletal:     Cervical back: Normal range of motion. No rigidity.     Comments: LUE- 5/5 in bicep, triceps, WE, grip, and finger abd RUE- biceps 2/5, triceps 2 0/5, grip 2-/5, finger abd 1/5 LLE- HF/KE/DF and PF 5/5 RLE- HF 3-/5, KE  4/5, DF 4/5, PF 4+/5 Trace R hand swelling, esp in fingers- from weakness  Skin:    Comments: No wounds on heels/extremities L AC fossa IV- blood in dressing L wrist IV- OK  Neurological:     Comments: Patient is alert in no acute distress.  Makes eye contact with examiner.  Oriented x3 and follows commands.  Ox3- appropriate, intact to light touch  In all 4 extremities     Results for orders placed or performed during the hospital encounter of 02/16/20 (from the past 24 hour(s))  CBG monitoring, ED     Status: Abnormal   Collection Time: 02/16/20  5:52 PM  Result Value Ref Range   Glucose-Capillary 136 (H) 70 - 99 mg/dL   Comment 1 Notify RN    Comment 2 Document in Chart   Protime-INR     Status: None   Collection Time: 02/16/20  5:54 PM  Result Value Ref Range   Prothrombin Time 12.7 11.4 - 15.2 seconds   INR 1.0 0.8 - 1.2  APTT     Status: Abnormal   Collection Time: 02/16/20  5:54 PM  Result Value Ref Range   aPTT 21 (L) 24 - 36 seconds  CBC     Status: Abnormal   Collection Time: 02/16/20  5:54 PM  Result Value Ref Range   WBC 10.8 (H) 4.0 - 10.5 K/uL   RBC 5.39 4.22 - 5.81 MIL/uL   Hemoglobin 16.0 13.0 - 17.0 g/dL   HCT 48.9 39.0 - 52.0 %   MCV 90.7 80.0 - 100.0 fL   MCH 29.7 26.0 - 34.0 pg   MCHC 32.7 30.0 - 36.0 g/dL   RDW 12.7 11.5 - 15.5 %   Platelets 265 150 - 400 K/uL   nRBC 0.0 0.0 - 0.2 %  Differential     Status: None   Collection Time: 02/16/20  5:54 PM  Result Value Ref Range   Neutrophils Relative % 67 %   Neutro Abs 7.2 1.7 - 7.7 K/uL   Lymphocytes Relative 24 %   Lymphs Abs 2.6 0.7 - 4.0 K/uL   Monocytes Relative 7 %   Monocytes Absolute 0.7 0.1 - 1.0 K/uL   Eosinophils Relative 1 %   Eosinophils Absolute 0.1 0.0 - 0.5 K/uL   Basophils Relative 1 %   Basophils Absolute 0.1 0.0 - 0.1 K/uL   Immature Granulocytes 0 %   Abs Immature Granulocytes 0.02 0.00 - 0.07 K/uL  Comprehensive metabolic panel     Status: Abnormal   Collection Time:  02/16/20  5:54 PM  Result Value Ref Range   Sodium 137 135 - 145 mmol/L   Potassium 3.9  3.5 - 5.1 mmol/L   Chloride 103 98 - 111 mmol/L   CO2 23 22 - 32 mmol/L   Glucose, Bld 134 (H) 70 - 99 mg/dL   BUN 24 (H) 8 - 23 mg/dL   Creatinine, Ser 1.41 (H) 0.61 - 1.24 mg/dL   Calcium 9.7 8.9 - 10.3 mg/dL   Total Protein 7.8 6.5 - 8.1 g/dL   Albumin 4.4 3.5 - 5.0 g/dL   AST 22 15 - 41 U/L   ALT 21 0 - 44 U/L   Alkaline Phosphatase 56 38 - 126 U/L   Total Bilirubin 0.5 0.3 - 1.2 mg/dL   GFR, Estimated 53 (L) >60 mL/min   Anion gap 11 5 - 15  I-stat chem 8, ED     Status: Abnormal   Collection Time: 02/16/20  6:00 PM  Result Value Ref Range   Sodium 139 135 - 145 mmol/L   Potassium 3.9 3.5 - 5.1 mmol/L   Chloride 105 98 - 111 mmol/L   BUN 29 (H) 8 - 23 mg/dL   Creatinine, Ser 1.30 (H) 0.61 - 1.24 mg/dL   Glucose, Bld 128 (H) 70 - 99 mg/dL   Calcium, Ion 1.07 (L) 1.15 - 1.40 mmol/L   TCO2 24 22 - 32 mmol/L   Hemoglobin 16.3 13.0 - 17.0 g/dL   HCT 48.0 39.0 - 52.0 %  MRSA PCR Screening     Status: None   Collection Time: 02/16/20  8:00 PM   Specimen: Nasopharyngeal  Result Value Ref Range   MRSA by PCR NEGATIVE NEGATIVE  Hemoglobin A1c     Status: None   Collection Time: 02/17/20  3:04 AM  Result Value Ref Range   Hgb A1c MFr Bld 5.5 4.8 - 5.6 %   Mean Plasma Glucose 111.15 mg/dL  Lipid panel     Status: Abnormal   Collection Time: 02/17/20  3:04 AM  Result Value Ref Range   Cholesterol 174 0 - 200 mg/dL   Triglycerides 64 <150 mg/dL   HDL 40 (L) >40 mg/dL   Total CHOL/HDL Ratio 4.4 RATIO   VLDL 13 0 - 40 mg/dL   LDL Cholesterol 121 (H) 0 - 99 mg/dL  Urine rapid drug screen (hosp performed)     Status: None   Collection Time: 02/17/20  7:13 AM  Result Value Ref Range   Opiates NONE DETECTED NONE DETECTED   Cocaine NONE DETECTED NONE DETECTED   Benzodiazepines NONE DETECTED NONE DETECTED   Amphetamines NONE DETECTED NONE DETECTED   Tetrahydrocannabinol NONE DETECTED NONE  DETECTED   Barbiturates NONE DETECTED NONE DETECTED   MR BRAIN WO CONTRAST  Result Date: 02/17/2020 CLINICAL DATA:  Follow-up stroke 24 hours post tPA. Right-sided weakness. EXAM: MRI HEAD WITHOUT CONTRAST TECHNIQUE: Multiplanar, multiecho pulse sequences of the brain and surrounding structures were obtained without intravenous contrast. COMPARISON:  CT studies done yesterday. FINDINGS: Brain: Diffusion imaging shows acute infarction in the left basal ganglia and radiating white matter tracts. No other acute insult. The brainstem and cerebellum are normal. Cerebral hemispheres elsewhere show few old small vessel infarctions of the white matter and of the right basal ganglia. No large vessel territory infarction. No mass lesion, hemorrhage, hydrocephalus or extra-axial collection. Vascular: Major vessels at the base of the brain show flow. Skull and upper cervical spine: Negative Sinuses/Orbits: Clear/normal Other: None IMPRESSION: 1. Acute infarction in the left basal ganglia and radiating white matter tracts. No mass effect or hemorrhage. 2. Mild chronic small-vessel ischemic changes elsewhere  affecting the cerebral hemispheric white matter and right basal ganglia. Electronically Signed   By: Nelson Chimes M.D.   On: 02/17/2020 12:52   CT HEAD CODE STROKE WO CONTRAST  Result Date: 02/16/2020 CLINICAL DATA:  Code stroke.  Acute neuro deficit EXAM: CT HEAD WITHOUT CONTRAST TECHNIQUE: Contiguous axial images were obtained from the base of the skull through the vertex without intravenous contrast. COMPARISON:  None. FINDINGS: Brain: Well-defined hypodensity in the right corona radiata compatible with chronic white matter infarct. Negative for acute infarct, hemorrhage, or mass. Ventricle size normal. Vascular: Negative for hyperdense vessel. Atherosclerotic calcification in the carotid and vertebral arteries. Skull: Negative Sinuses/Orbits: Paranasal sinuses clear.  Negative orbit Other: None ASPECTS ([place]  Stroke Program Calvillo CT Score) - Ganglionic level infarction (caudate, lentiform nuclei, internal capsule, insula, M1-M3 cortex): 7 - Supraganglionic infarction (M4-M6 cortex): 3 Total score (0-10 with 10 being normal): 10 IMPRESSION: 1. No acute abnormality identified 2. ASPECTS is 10 3. Chronic infarct in the right corona radiata. 4. Code stroke imaging results were communicated on 02/16/2020 at 6:06 pm to provider Bhagat via text page Electronically Signed   By: Franchot Gallo M.D.   On: 02/16/2020 18:07   CT ANGIO HEAD CODE STROKE  Result Date: 02/16/2020 CLINICAL DATA:  Acute neuro deficit. EXAM: CT ANGIOGRAPHY HEAD AND NECK TECHNIQUE: Multidetector CT imaging of the head and neck was performed using the standard protocol during bolus administration of intravenous contrast. Multiplanar CT image reconstructions and MIPs were obtained to evaluate the vascular anatomy. Carotid stenosis measurements (when applicable) are obtained utilizing NASCET criteria, using the distal internal carotid diameter as the denominator. CONTRAST:  21m OMNIPAQUE IOHEXOL 350 MG/ML SOLN COMPARISON:  CT head 02/16/2020 FINDINGS: CTA NECK FINDINGS Aortic arch: Standard branching. Imaged portion shows no evidence of aneurysm or dissection. No significant stenosis of the major arch vessel origins. Right carotid system: Mild atherosclerotic disease right carotid bifurcation without stenosis. Left carotid system: Mild atherosclerotic disease left carotid bifurcation without stenosis. Vertebral arteries: Both vertebral arteries are patent to the basilar without stenosis. Skeleton: Cervical spondylosis.  No acute skeletal abnormality. Other neck: Negative for mass or adenopathy. 8 mm calcification right thyroid nodule. No further imaging necessary. Upper chest: Lung apices clear bilaterally. Review of the MIP images confirms the above findings CTA HEAD FINDINGS Anterior circulation: Mild atherosclerotic disease in the cavernous carotid  bilaterally without stenosis. Anterior and middle cerebral arteries patent bilaterally without large vessel occlusion. Mild atherosclerotic irregularity in the anterior and middle cerebral arteries bilaterally without significant stenosis. Posterior circulation: Both vertebral arteries patent to the basilar. PICA patent bilaterally. Basilar widely patent. Superior cerebellar arteries patent bilaterally. Moderate stenosis P2 segment bilaterally. No large vessel occlusion Venous sinuses: Normal venous enhancement. Anatomic variants: None Review of the MIP images confirms the above findings IMPRESSION: 1. Negative for intracranial large vessel occlusion 2. Intracranial atherosclerotic disease with moderate stenosis in the P2 segment bilaterally. Mild atherosclerotic irregularity in the anterior and middle cerebral arteries. 3. No significant carotid or vertebral artery stenosis in the neck. Electronically Signed   By: CFranchot GalloM.D.   On: 02/16/2020 18:41   CT ANGIO NECK CODE STROKE  Result Date: 02/16/2020 CLINICAL DATA:  Acute neuro deficit. EXAM: CT ANGIOGRAPHY HEAD AND NECK TECHNIQUE: Multidetector CT imaging of the head and neck was performed using the standard protocol during bolus administration of intravenous contrast. Multiplanar CT image reconstructions and MIPs were obtained to evaluate the vascular anatomy. Carotid stenosis measurements (when applicable) are obtained  utilizing NASCET criteria, using the distal internal carotid diameter as the denominator. CONTRAST:  72m OMNIPAQUE IOHEXOL 350 MG/ML SOLN COMPARISON:  CT head 02/16/2020 FINDINGS: CTA NECK FINDINGS Aortic arch: Standard branching. Imaged portion shows no evidence of aneurysm or dissection. No significant stenosis of the major arch vessel origins. Right carotid system: Mild atherosclerotic disease right carotid bifurcation without stenosis. Left carotid system: Mild atherosclerotic disease left carotid bifurcation without stenosis.  Vertebral arteries: Both vertebral arteries are patent to the basilar without stenosis. Skeleton: Cervical spondylosis.  No acute skeletal abnormality. Other neck: Negative for mass or adenopathy. 8 mm calcification right thyroid nodule. No further imaging necessary. Upper chest: Lung apices clear bilaterally. Review of the MIP images confirms the above findings CTA HEAD FINDINGS Anterior circulation: Mild atherosclerotic disease in the cavernous carotid bilaterally without stenosis. Anterior and middle cerebral arteries patent bilaterally without large vessel occlusion. Mild atherosclerotic irregularity in the anterior and middle cerebral arteries bilaterally without significant stenosis. Posterior circulation: Both vertebral arteries patent to the basilar. PICA patent bilaterally. Basilar widely patent. Superior cerebellar arteries patent bilaterally. Moderate stenosis P2 segment bilaterally. No large vessel occlusion Venous sinuses: Normal venous enhancement. Anatomic variants: None Review of the MIP images confirms the above findings IMPRESSION: 1. Negative for intracranial large vessel occlusion 2. Intracranial atherosclerotic disease with moderate stenosis in the P2 segment bilaterally. Mild atherosclerotic irregularity in the anterior and middle cerebral arteries. 3. No significant carotid or vertebral artery stenosis in the neck. Electronically Signed   By: CFranchot GalloM.D.   On: 02/16/2020 18:41     Assessment/Plan: Diagnosis: L basal ganglia stroke with R dense hemiparesis 1. Does the need for close, 24 hr/day medical supervision in concert with the patient's rehab needs make it unreasonable for this patient to be served in a less intensive setting? Yes 2. Co-Morbidities requiring supervision/potential complications: HTN, HLD, DM, R hand swelling- mild constipation 3. Due to bowel management, safety, skin/wound care, disease management, medication administration and patient education, does the  patient require 24 hr/day rehab nursing? Yes 4. Does the patient require coordinated care of a physician, rehab nurse, therapy disciplines of PT and OT to address physical and functional deficits in the context of the above medical diagnosis(es)? Yes Addressing deficits in the following areas: balance, endurance, locomotion, strength, transferring, bathing, dressing, feeding, grooming and toileting 5. Can the patient actively participate in an intensive therapy program of at least 3 hrs of therapy per day at least 5 days per week? Yes 6. The potential for patient to make measurable gains while on inpatient rehab is good 7. Anticipated functional outcomes upon discharge from inpatient rehab are modified independent, supervision and min assist  with PT, modified independent, supervision and min assist with OT, n/a with SLP. 8. Estimated rehab length of stay to reach the above functional goals is: 10-14 days 9. Anticipated discharge destination: Home 10. Overall Rehab/Functional Prognosis: good  RECOMMENDATIONS: This patient's condition is appropriate for continued rehabilitative care in the following setting: CIR Patient has agreed to participate in recommended program. Potentially Note that insurance prior authorization may be required for reimbursement for recommended care.  Comment:  1. Pt's LBM was Sunday- if no BM by tomorrow, might need intervention 2. Will submit for inpt Rehab/CIR- pt is a good candidate for inpt rehab.  3. Will d/w admissions coordinators 4. Thank you for this consult   DCathlyn Parsons PA-C 02/17/2020

## 2020-02-17 NOTE — Progress Notes (Addendum)
STROKE TEAM PROGRESS NOTE  HPI per record: Brent Mccormick. is a 70 y.o. male with past medical history significant for hypertension, hyperlipidemia, diabetes, gout and former smoking (quit in 1968). He was in his usual state of health today when he began to have some right arm and leg weakness as well as a right facial droop.  He took 4 baby aspirin and his son activated EMS.  His wife is an ED nurse here and met [place] on his arrival.  Patient and wife both report that he has been in his usual state of health, no fevers, chills, cough, cold.  No urinary bleeding.  No bleeding in his stool.  No bloody emesis.  No recent procedures.  No prior history of intracerebral hemorrhage, aneurysms, significant trauma.  No prior history of stroke. LKW: 5:10 PM tPA given?: Yes IA performed?: No Premorbid modified rankin scale:      0 - No symptoms.  INTERVAL HISTORY No acute overnight events. Patient evaluated at bedside this morning, wife present in the room.  Patient states he feels tired due to lack of sleep last night.  Overall he thinks he is improving.  He states he was not taking his cholesterol medication at home but was taking aspirin.  He denies any new complaints.  And they suggest CIR.  Patient received TPA yesterday at 6:13 PM.  He is in ICU for observation post tPA.  MRI this morning shows acute infarction in left basal ganglia and radiating white matter tract.  No mass-effect or hemorrhage.  Patient is stable enough to transfer to progressive unit today.  Started patient on Crestor 20 mg.  Blood pressure well controlled.  Neurologically patient is improving and see below for details of neurological exam.  Vitals:   02/17/20 0300 02/17/20 0330 02/17/20 0400 02/17/20 0430  BP: 116/81 126/77 128/79 138/75  Pulse: 66 67 74 67  Resp: 17 12 17 11   Temp:   97.6 F (36.4 C)   TempSrc:   Oral   SpO2: 91% 95% 100% 98%  Weight:       CBC:  Recent Labs  Lab 02/16/20 1754 02/16/20 1800  WBC 10.8*   --   NEUTROABS 7.2  --   HGB 16.0 16.3  HCT 48.9 48.0  MCV 90.7  --   PLT 265  --    Basic Metabolic Panel:  Recent Labs  Lab 02/16/20 1754 02/16/20 1800  NA 137 139  K 3.9 3.9  CL 103 105  CO2 23  --   GLUCOSE 134* 128*  BUN 24* 29*  CREATININE 1.41* 1.30*  CALCIUM 9.7  --    Lipid Panel:  Recent Labs  Lab 02/17/20 0304  CHOL 174  TRIG 64  HDL 40*  CHOLHDL 4.4  VLDL 13  LDLCALC 121*   HgbA1c:  Recent Labs  Lab 02/17/20 0304  HGBA1C 5.5   Urine Drug Screen: None detected  IMAGING past 24 hours  CT Head code stroke 02/16/2019  IMPRESSION: 1. No acute abnormality identified 2. ASPECTS is 10 3. Chronic infarct in the right corona radiata.  CT Angio Head and Neck  02/16/2020  IMPRESSION: 1. Negative for intracranial large vessel occlusion 2. Intracranial atherosclerotic disease with moderate stenosis in the P2 segment bilaterally. Mild atherosclerotic irregularity in the anterior and middle cerebral arteries. 3. No significant carotid or vertebral artery stenosis in the neck.  MRI Brain 02/17/2020  IMPRESSION: 1. Acute infarction in the left basal ganglia and radiating white matter tracts.  No mass effect or hemorrhage. 2. Mild chronic small-vessel ischemic changes elsewhere affecting the cerebral hemispheric white matter and right basal ganglia.  Echocardiogram 02/17/2020  Pending  PHYSICAL EXAM Constitutional: Appears well-developed and well-nourished.  Psych: Affect appropriate to situation Eyes: No scleral injection HENT: No OP obstruction, good dentition MSK: no joint deformities.  Cardiovascular: Normal rate and regular rhythm.  Respiratory: Effort normal, non-labored breathing GI: Soft.  No distension. There is no tenderness.  Skin: WDI   Mental Status: Alert, oriented, thought content appropriate.  Speech fluent without evidence of aphasia.  Able to follow 3 step commands without difficulty. Cranial Nerves: II:  Visual fields  grossly normal, pupils equal, round, reactive to light and accommodation III,IV, VI: ptosis not present, extra-ocular motions intact bilaterally V,VII: smile asymmetric, right facial droop, facial light touch sensation normal bilaterally VIII: hearing normal bilaterally IX,X: uvula rises symmetrically XI: bilateral shoulder shrug XII: midline tongue extension without atrophy or fasciculations  Motor: Right : Upper extremity   2+/5    Left:     Upper extremity   5/5  Lower extremity   3+/5     Lower extremity   5/5 Tone and bulk:normal tone throughout; no atrophy noted Sensory: Pinprick and light touch intact throughout, bilaterally Deep Tendon Reflexes:  Right: Upper Extremity   Left: Upper extremity   biceps (C-5 to C-6) 2/4   biceps (C-5 to C-6) 2/4 tricep (C7) 2/4    triceps (C7) 2/4 Brachioradialis (C6) 2/4  Brachioradialis (C6) 2/4  Lower Extremity Lower Extremity  quadriceps (L-2 to L-4) 2/4   quadriceps (L-2 to L-4) 2/4 Achilles (S1) 2/4   Achilles (S1) 2/4  Plantars: Right: downgoing   Left: downgoing Cerebellar: normal finger-to-nose Gait: Deferred     ASSESSMENT/PLAN Mr. Latron Ribas Sr. is a 70 y.o. male with PMHx of hypertension, hyperlipidemia, diabetes, gout, former smoker quit in 1968 presented with right arm and right leg weakness and right facial droop.  Patient was noncompliant with his statins but claims was taking aspirin daily.  He received TPA at 6:13 p.m. on 02/16/2020.  CT head does not show any acute abnormality.  CTA does not show any intracranial large vessel occlusion.  MRI post TPA shows acute infarction in left basal ganglia and radiating white matter tracts.  Stroke: Acute ischemic infarct in left basal ganglia, likely due to small vessel disease. Code Stroke CT head No acute abnormality.ASPECTS 10.  CTA head & neck :  Negative for intracranial large vessel occlusion MRI : Acute infarction in the left basal ganglia and radiating white matter  tracts. 2D Echo : Pending LDL 121 HgbA1c 5.5 VTE prophylaxis -SCDs    Diet   Diet Heart Room service appropriate? Yes with Assist; Fluid consistency: Thin   aspirin 81 mg daily prior to admission, now on No antithrombotic. For now due to post tPA. Consider aspirin 81 mg and Plavix 75 mg for 3 weeks tomorrow and then continue Plavix alone Therapy recommendations: CIR Disposition: Pending  Hypertension Home meds: Losartan 100 mg Stable BP <180/105 for first 24 hours post tPA Long-term BP goal normotensive  Hyperlipidemia Home meds:  Crestor 5 mg LDL 121, goal < 70 Start Crestor 20 mg Continue statin at discharge  Diabetes type II Controlled Home meds: None HgbA1c 5.5, goal < 7.0 CBGs Recent Labs    02/16/20 1752  GLUCAP 136*    SSI  Other Stroke Risk Factors Advanced Age >/= 62  Former smoker, quit in 1968 ETOH use, alcohol level ,  and will be advised to stop drinking alcohol. ?  CHF, echocardiogram pending.   Other Active Problems Gout : Colchicine 0.6 mg tablet daily.  Hospital day # 1 I have personally obtained history,examined this patient, reviewed notes, independently viewed imaging studies, participated in medical decision making and plan of care.ROS completed by me personally and pertinent positives fully documented  I have made any additions or clarifications directly to the above note. Agree with note above.  He presented with right hemiparesis and received IV TPA and has shown slight improvement in his weakness.  MRI confirms left subcortical infarct likely from small vessel disease.  Recommend mobilize out of bed.  Therapy consults.  Resume home medications.  Aspirin Plavix for 3 weeks then Plavix alone.  Aggressive risk factor modification.  Strict control of blood pressure as per post TPA protocol and close neurological monitoring.  Long discussion with patient and answered questions.  Discussed with care team. This patient is critically ill and at  significant risk of neurological worsening, death and care requires constant monitoring of vital signs, hemodynamics,respiratory and cardiac monitoring, extensive review of multiple databases, frequent neurological assessment, discussion with family, other specialists and medical decision making of high complexity.I have made any additions or clarifications directly to the above note.This critical care time does not reflect procedure time, or teaching time or supervisory time of PA/NP/Med Resident etc but could involve care discussion time.  I spent 30 minutes of neurocritical care time  in the care of  this patient.      Antony Contras, MD Medical Director [facility] Pager: [phone] 02/17/2020 4:31 PM   To contact Stroke Continuity provider, please refer to [email or web address] After hours, contact General Neurology

## 2020-02-17 NOTE — Procedures (Signed)
Echo attempted. Patient just seated in chair and would like to have echo later. Will attempt again as time allows.

## 2020-02-17 NOTE — Progress Notes (Signed)
  Echocardiogram 2D Echocardiogram has been performed.  Brent Mccormick 02/17/2020, 5:21 PM

## 2020-02-17 NOTE — Progress Notes (Signed)
Inpatient Rehab Admissions Coordinator Note:   Per therapy recommendations, pt was screened for CIR candidacy by Cedarius Kersh, MS CCC-SLP. At this time, Pt. Appears to have functional decline and is a good candidate for CIR. Will request order for rehab consult per protocol.  Please contact me with questions.   Lillyona Polasek, MS, CCC-SLP Rehab Admissions Coordinator  [phone] (celll) [phone] (office)  

## 2020-02-17 NOTE — Evaluation (Signed)
Occupational Therapy Evaluation Patient Details Name: Brent Mcnutt Sr. MRN: [id number] DOB: Feb 14, 1950 Today's Date: 02/17/2020    History of Present Illness The pt is a 70 yo male presenting with R-sided arm and leg weakness and R-sided facial droop. CT revealed no acute infarct, awaiting MRI, suspected L-sided lacunar infarct. tPA given 1810 1/30. PMH includes: HTN, HLD, DM, gout, and former smoker.   Clinical Impression   PT admitted with R hemiplegia. Pt currently with functional limitiations due to the deficits listed below (see OT problem list). Pt currently requires visual input and extensive time to initiate R UE movement. Pt transferred EOB to the bathroom with with R LE blocked and hand held (A) mod. Pt with narrowed base of Support and R knee flexion. Pt motivated and willing to work hard for more intense therapy. Wife present during session.  Pt will benefit from skilled OT to increase their independence and safety with adls and balance to allow discharge CIR.     Follow Up Recommendations  CIR    Equipment Recommendations  3 in 1 bedside commode;Hospital bed;Wheelchair cushion (measurements OT);Wheelchair (measurements OT)    Recommendations for Other Services Rehab consult     Precautions / Restrictions Precautions Precautions: Fall Precaution Comments: R-sided weakness      Mobility Bed Mobility Overal bed mobility: Needs Assistance Bed Mobility: Rolling;Supine to Sit Rolling: Mod assist   Supine to sit: Mod assist     General bed mobility comments: Pt exiting on the L side of the bed to allow for L UE supine to sit.    Transfers Overall transfer level: Needs assistance Equipment used: 1 person hand held assist Transfers: Sit to/from Stand Sit to Stand: Max assist         General transfer comment: pt    Balance Overall balance assessment: Needs assistance Sitting-balance support: Single extremity supported;Feet supported Sitting balance-Leahy  Scale: Poor     Standing balance support: Single extremity supported;During functional activity Standing balance-Leahy Scale: Poor Standing balance comment: mod (A) with static standing and R LE blocked.                           ADL either performed or assessed with clinical judgement   ADL Overall ADL's : Needs assistance/impaired Eating/Feeding: Moderate assistance;Bed level Eating/Feeding Details (indicate cue type and reason): positioned upright with tray positioned to help with self feeding Grooming: Maximal assistance;Sitting                   Toilet Transfer: Maximal assistance Toilet Transfer Details (indicate cue type and reason): pt transferred from bed to toilet with R LE blocked. pt needed step by step sequence to progress         Functional mobility during ADLs: Maximal assistance General ADL Comments: pt motivated to transfer to the bathroom this session. wife present during session and very helpful with line management. Pt with R inattention     Vision Baseline Vision/History: Wears glasses Wears Glasses: Reading only Vision Assessment?: Yes Eye Alignment: Within Functional Limits Ocular Range of Motion: Within Functional Limits Tracking/Visual Pursuits: Able to track stimulus in all quads without difficulty Additional Comments: R inattention     Perception Perception Perception Tested?: Yes Perception Deficits: Inattention/neglect Inattention/Neglect: Does not attend to right side of body   Praxis      Pertinent Vitals/Pain Pain Assessment: No/denies pain     Hand Dominance Right   Extremity/Trunk Assessment Upper Extremity Assessment Upper  Extremity Assessment: RUE deficits/detail RUE Deficits / Details: able to complete digit flexion but lacks digits extension, adduction of thumb present, decreased bicep but tricep 3 out 5 against gravity. able to shrug shoulders in scapula elevation RUE Sensation: decreased light touch RUE  Coordination: decreased fine motor;decreased gross motor   Lower Extremity Assessment Lower Extremity Assessment: Defer to PT evaluation   Cervical / Trunk Assessment Cervical / Trunk Assessment: Normal   Communication Communication Communication: No difficulties   Cognition Arousal/Alertness: Awake/alert Behavior During Therapy: WFL for tasks assessed/performed Overall Cognitive Status: Within Functional Limits for tasks assessed                                     General Comments  VSS    Exercises     Shoulder Instructions      Home Living Family/patient expects to be discharged to:: Private residence Living Arrangements: Spouse/significant other;Children Available Help at Discharge: Family;Available 24 hours/day Type of Home: House Home Access: Stairs to enter [employer] of Steps: 5 or 8 Entrance Stairs-Rails: Can reach both Home Layout: One level     Bathroom Shower/Tub: Occupational psychologist: Standard     Home Equipment: Shower seat - built in;Grab bars - tub/shower;Crutches;Hand held shower head   Additional Comments: son that is 5 yo that works but lives in the home. 2 cats inside and 2 outside dogs      Prior Functioning/Environment Level of Independence: Independent        Comments: works from [employer]. enjoys reading historical non-fiction. Wife is RN at [employer], can be with pt 24/7 tues-fri and other family can be available to cover other days        OT Problem List: Decreased strength;Decreased range of motion;Decreased activity tolerance;Impaired balance (sitting and/or standing);Decreased coordination;Decreased cognition;Decreased safety awareness;Decreased knowledge of use of DME or AE;Decreased knowledge of precautions;Impaired UE functional use      OT Treatment/Interventions: Self-care/ADL training;Therapeutic exercise;Neuromuscular education;Energy conservation;DME and/or AE  instruction;Manual therapy;Modalities;Therapeutic activities;Cognitive remediation/compensation;Patient/family education;Balance training    OT Goals(Current goals can be found in the care plan section) Acute Rehab OT Goals Patient Stated Goal: return home with independence OT Goal Formulation: With patient/family Time For Goal Achievement: 03/02/20 Potential to Achieve Goals: Good  OT Frequency: Min 2X/week   Barriers to D/C:            Co-evaluation              AM-PAC OT "6 Clicks" Daily Activity     Outcome Measure Help from another person eating meals?: A Lot Help from another person taking care of personal grooming?: A Lot Help from another person toileting, which includes using toliet, bedpan, or urinal?: A Lot Help from another person bathing (including washing, rinsing, drying)?: A Lot Help from another person to put on and taking off regular upper body clothing?: A Lot Help from another person to put on and taking off regular lower body clothing?: A Lot 6 Click Score: 12   End of Session Equipment Utilized During Treatment: Gait belt Nurse Communication: Mobility status;Precautions  Activity Tolerance: Patient tolerated treatment well Patient left: in chair;with call bell/phone within reach;with family/visitor present (RN notified no chair alarm and agreeable to OOB)  OT Visit Diagnosis: Unsteadiness on feet (R26.81);Muscle weakness (generalized) (M62.81);Hemiplegia and hemiparesis Hemiplegia - Right/Left: Right Hemiplegia - dominant/non-dominant: Dominant Hemiplegia - caused by: Cerebral infarction  Time: [id number] OT Time Calculation (min): 37 min Charges:  OT General Charges $OT Visit: 1 Visit OT Evaluation $OT Eval Moderate Complexity: 1 Mod OT Treatments $Self Care/Home Management : 8-22 mins   Brynn, OTR/L  Acute Rehabilitation Services Pager: [phone] Office: [phone] .   Jeri Modena 02/17/2020, 4:01 PM

## 2020-02-18 DIAGNOSIS — I6381 Other cerebral infarction due to occlusion or stenosis of small artery: Secondary | ICD-10-CM | POA: Diagnosis not present

## 2020-02-18 LAB — BASIC METABOLIC PANEL
Anion gap: 9 (ref 5–15)
BUN: 16 mg/dL (ref 8–23)
CO2: 23 mmol/L (ref 22–32)
Calcium: 9.1 mg/dL (ref 8.9–10.3)
Chloride: 108 mmol/L (ref 98–111)
Creatinine, Ser: 1.05 mg/dL (ref 0.61–1.24)
GFR, Estimated: >60 mL/min (ref >60)
Glucose, Bld: 140 mg/dL — ABNORMAL HIGH (ref 70–99)
Potassium: 3.9 mmol/L (ref 3.5–5.1)
Sodium: 140 mmol/L (ref 135–145)

## 2020-02-18 LAB — CBC WITH DIFFERENTIAL/PLATELET
Abs Immature Granulocytes: 0.04 10*3/uL (ref 0.00–0.07)
Basophils Absolute: 0.1 10*3/uL (ref 0.0–0.1)
Basophils Relative: 1 %
Eosinophils Absolute: 0.1 10*3/uL (ref 0.0–0.5)
Eosinophils Relative: 1 %
HCT: 43.2 % (ref 39.0–52.0)
Hemoglobin: 15.2 g/dL (ref 13.0–17.0)
Immature Granulocytes: 0 %
Lymphocytes Relative: 14 %
Lymphs Abs: 1.5 10*3/uL (ref 0.7–4.0)
MCH: 31.5 pg (ref 26.0–34.0)
MCHC: 35.2 g/dL (ref 30.0–36.0)
MCV: 89.6 fL (ref 80.0–100.0)
Monocytes Absolute: 0.8 10*3/uL (ref 0.1–1.0)
Monocytes Relative: 7 %
Neutro Abs: 8.4 10*3/uL — ABNORMAL HIGH (ref 1.7–7.7)
Neutrophils Relative %: 77 %
Platelets: 262 10*3/uL (ref 150–400)
RBC: 4.82 MIL/uL (ref 4.22–5.81)
RDW: 12.9 % (ref 11.5–15.5)
WBC: 10.9 10*3/uL — ABNORMAL HIGH (ref 4.0–10.5)
nRBC: 0 % (ref 0.0–0.2)

## 2020-02-18 MED ORDER — ENOXAPARIN SODIUM 40 MG/0.4ML ~~LOC~~ SOLN
40.0000 mg | Freq: Every day | SUBCUTANEOUS | Status: DC
  Administered 2020-02-18 – 2020-02-21 (×4): 40 mg via SUBCUTANEOUS
  Filled 2020-02-18 (×4): qty 0.4

## 2020-02-18 MED ORDER — INFLUENZA VAC A&B SA ADJ QUAD 0.5 ML IM PRSY
0.5000 mL | PREFILLED_SYRINGE | INTRAMUSCULAR | Status: DC
  Filled 2020-02-18: qty 0.5

## 2020-02-18 NOTE — Progress Notes (Signed)
STROKE TEAM PROGRESS NOTE   INTERVAL HISTORY No acute overnight events. Remained in ICU d/t bed shortage. Remains stable post tPA. Working with therapy team who have recommended CIR. He continues to have right hemiplegia but is able to move right arm and leg slightly against gravity.  Vital signs are stable.  Neuro exam is unchanged Patient is stable enough to transfer to floor  today.   Blood pressure well controlled.   Vitals:   02/18/20 0500 02/18/20 0600 02/18/20 0700 02/18/20 0800  BP: 127/82 136/81 (!) 137/93 (!) 175/104  Pulse: (!) 58 66 62 74  Resp: 10 13 12 16   Temp:    98.6 F (37 C)  TempSrc:    Oral  SpO2: 97% 95% 96% 97%  Weight:       CBC:  Recent Labs  Lab 02/16/20 1754 02/16/20 1800  WBC 10.8*  --   NEUTROABS 7.2  --   HGB 16.0 16.3  HCT 48.9 48.0  MCV 90.7  --   PLT 265  --    Basic Metabolic Panel:  Recent Labs  Lab 02/16/20 1754 02/16/20 1800  NA 137 139  K 3.9 3.9  CL 103 105  CO2 23  --   GLUCOSE 134* 128*  BUN 24* 29*  CREATININE 1.41* 1.30*  CALCIUM 9.7  --    Lipid Panel:  Recent Labs  Lab 02/17/20 0304  CHOL 174  TRIG 64  HDL 40*  CHOLHDL 4.4  VLDL 13  LDLCALC 121*   HgbA1c:  Recent Labs  Lab 02/17/20 0304  HGBA1C 5.5   Urine Drug Screen: None detected  IMAGING past 24 hours  CT Head code stroke 02/16/2019  IMPRESSION: 1. No acute abnormality identified 2. ASPECTS is 10 3. Chronic infarct in the right corona radiata.  CT Angio Head and Neck  02/16/2020  IMPRESSION: 1. Negative for intracranial large vessel occlusion 2. Intracranial atherosclerotic disease with moderate stenosis in the P2 segment bilaterally. Mild atherosclerotic irregularity in the anterior and middle cerebral arteries. 3. No significant carotid or vertebral artery stenosis in the neck.  MRI Brain 02/17/2020  IMPRESSION: 1. Acute infarction in the left basal ganglia and radiating white matter tracts. No mass effect or hemorrhage. 2. Mild  chronic small-vessel ischemic changes elsewhere affecting the cerebral hemispheric white matter and right basal ganglia.  Echocardiogram 02/17/2020  Pending  PHYSICAL EXAM Constitutional: Appears well-developed and well-nourished.  Psych: Affect appropriate to situation Eyes: No scleral injection HENT: No OP obstruction, good dentition MSK: no joint deformities.  Cardiovascular: Normal rate and regular rhythm.  Respiratory: Effort normal, non-labored breathing GI: Soft.  No distension. There is no tenderness.  Skin: WDI   Mental Status: Alert, oriented, thought content appropriate.  Speech fluent without evidence of aphasia.  Able to follow 3 step commands without difficulty. Cranial Nerves: II:  Visual fields grossly normal, pupils equal, round, reactive to light and accommodation III,IV, VI: ptosis not present, extra-ocular motions intact bilaterally V,VII: smile asymmetric, right facial droop, facial light touch sensation normal bilaterally VIII: hearing normal bilaterally IX,X: uvula rises symmetrically XI: bilateral shoulder shrug XII: midline tongue extension without atrophy or fasciculations  Motor: Right : Upper extremity   2+/5    Left:     Upper extremity   5/5  Lower extremity   3+/5     Lower extremity   5/5 Tone and bulk:normal tone throughout; no atrophy noted Sensory: Pinprick and light touch intact throughout, bilaterally Deep Tendon Reflexes:  Right: Upper Extremity  Left: Upper extremity   biceps (C-5 to C-6) 2/4   biceps (C-5 to C-6) 2/4 tricep (C7) 2/4    triceps (C7) 2/4 Brachioradialis (C6) 2/4  Brachioradialis (C6) 2/4  Lower Extremity Lower Extremity  quadriceps (L-2 to L-4) 2/4   quadriceps (L-2 to L-4) 2/4 Achilles (S1) 2/4   Achilles (S1) 2/4  Plantars: Right: downgoing   Left: downgoing Cerebellar: normal finger-to-nose Gait: Deferred     ASSESSMENT/PLAN [name] is a 70 y.o. male with PMHx of hypertension,  hyperlipidemia, diabetes, gout, former smoker quit in 1968 presented with right arm and right leg weakness and right facial droop.  Patient was noncompliant with his statins but claims was taking aspirin daily.  He received TPA at 6:13 p.m. on 02/16/2020.  CT head does not show any acute abnormality.  CTA does not show any intracranial large vessel occlusion.  MRI post TPA shows acute infarction in left basal ganglia and radiating white matter tracts.  Stroke: Acute ischemic infarct in left basal ganglia, likely due to small vessel disease.  Code Stroke CT head No acute abnormality.ASPECTS 10.   CTA head & neck :  Negative for intracranial large vessel occlusion  MRI : Acute infarction in the left basal ganglia and radiating white matter tracts.  2D Echo : Pending  LDL 121  HgbA1c 5.5  VTE prophylaxis -SCDs    Diet   Diet Heart Room service appropriate? Yes with Assist; Fluid consistency: Thin    aspirin 81 mg daily prior to admission, now on No antithrombotic. For now due to post tPA. Consider aspirin 81 mg and Plavix 75 mg for 3 weeks tomorrow and then continue Plavix alone  Therapy recommendations: CIR  Disposition: Pending  Hypertension  Home meds: Losartan 100 mg  Stable . BP <180/105 for first 24 hours post tPA . Long-term BP goal normotensive  Hyperlipidemia  Home meds:  Crestor 5 mg  LDL 121, goal < 70  Start Crestor 20 mg  Continue statin at discharge  Diabetes type II Controlled  Home meds: None  HgbA1c 5.5, goal < 7.0  CBGs Recent Labs    02/16/20 1752  GLUCAP 136*      SSI  Ctn Daily labs to monitor  Other Stroke Risk Factors  Advanced Age >/= 8   Former smoker, quit in 1968  ETOH use, alcohol level , and will be advised to stop drinking alcohol.  ?  CHF, echocardiogram pending.  Other Active Problems  Gout : Colchicine 0.6 mg tablet daily.  Hospital day # 2  Continue mobilization out of bed and ongoing therapy consults.   Transfer out of the ICU to the neuro floor when bed available and hopefully transfer to inpatient rehab in the next few days.  Discussed with rehab coordinator greater than 50% time during this 35-minute visit was spent in counseling and coordination of care and discussion with care team and answering questions.  Antony Contras, MD Medical Director [facility] Pager: [phone] 02/18/2020 9:45 AM   To contact Stroke Continuity provider, please refer to [email or web address] After hours, contact General Neurology

## 2020-02-18 NOTE — Plan of Care (Signed)
Patient alert and oriented, capable of communicating needs and participated with ADLs. Stroke mapping book and Plavix education printout reviewed with patient and wife, [name].

## 2020-02-18 NOTE — Evaluation (Signed)
Speech Language Pathology Evaluation Patient Details Name: Brent Mccormick. MRN: [id number] DOB: 1950/05/10 Today's Date: 02/18/2020 Time: 6384-6659 SLP Time Calculation (min) (ACUTE ONLY): 22 min  Problem List:  Patient Active Problem List   Diagnosis Date Noted  . Left sided lacunar infarction ([place]) 02/16/2020  . Chronic hepatitis C without hepatic coma ([place]) 01/20/2016  . Lumbar disc disease with radiculopathy 03/07/2012  . Essential hypertension 12/14/2009  . RHINITIS 03/26/2008  . Gout 01/15/2007  . ERECTILE DYSFUNCTION 01/15/2007  . CARPAL TUNNEL SYNDROME, RIGHT 01/15/2007   Past Medical History:  Past Medical History:  Diagnosis Date  . Carpal tunnel syndrome   . Gout   . Hyperlipidemia    on meds  . Hypertension    on meds   Past Surgical History:  Past Surgical History:  Procedure Laterality Date  . COLONOSCOPY  2015   DJ-MAC-polyps  . no prior surgery    . POLYPECTOMY  2015   DJ-MAC-polyps   HPI:  The pt is a 70 yo male presenting with R-sided arm and leg weakness and R-sided facial droop.  MRI acute infarction in the left basal ganglia and radiating white matter tracts. No mass effect or hemorrhage, mild chronic small-vessel ischemic changes elsewhere affecting the cerebral hemispheric white matter and right basal ganglia.  tPA given  PMH includes: HTN, HLD, DM, gout, and former smoker.   Assessment / Plan / Recommendation Clinical Impression  Brent Mccormick was very pleasant throughout speech-cognitive assessment and vocalized awareness of deficits including right arm and leg weakness, minimal dysarthria. Not as aware of executive functions that may not be at his baseline. On the SLUMS he scored a 24/30 with 27 with in normal range. He is currently working and [employer]. Areas of improvement are in information retrieval, possibly visuospatial information and speech clarity during times of noisy environments or with fatiugue. He is a good candidate for  inpatient rehab.    SLP Assessment  SLP Recommendation/Assessment: Patient needs continued Speech Lanaguage Pathology Services SLP Visit Diagnosis: Cognitive communication deficit (R41.841);Dysarthria and anarthria (R47.1)    Follow Up Recommendations  Inpatient Rehab    Frequency and Duration min 2x/week  2 weeks      SLP Evaluation Cognition  Overall Cognitive Status: Impaired/Different from baseline Arousal/Alertness: Awake/alert Orientation Level: Oriented X4 Attention: Sustained Sustained Attention: Appears intact Memory: Impaired Memory Impairment: Retrieval deficit Awareness: Appears intact Problem Solving: Appears intact Safety/Judgment: Appears intact       Comprehension  Auditory Comprehension Overall Auditory Comprehension: Appears within functional limits for tasks assessed Visual Recognition/Discrimination Discrimination: Not tested Reading Comprehension Reading Status:  (TBA)    Expression Expression Primary Mode of Expression: Verbal Verbal Expression Overall Verbal Expression: Appears within functional limits for tasks assessed Pragmatics: No impairment Written Expression Dominant Hand: Right Written Expression: Not tested   Oral / Motor  Oral Motor/Sensory Function Overall Oral Motor/Sensory Function: Mild impairment Facial ROM: Reduced right;Suspected CN VII (facial) dysfunction Facial Symmetry: Abnormal symmetry right;Suspected CN VII (facial) dysfunction Facial Strength: Reduced right;Suspected CN VII (facial) dysfunction Lingual ROM: Within Functional Limits Motor Speech Overall Motor Speech: Impaired Respiration: Within functional limits Phonation: Normal Resonance: Within functional limits Articulation: Within functional limitis Intelligibility:  (intelligible but minimal distortion) Motor Planning: Witnin functional limits   GO                    Houston Siren 02/18/2020, 11:02 AM Orbie Pyo Colvin Caroli.Ed  Risk analyst [phone] Office [phone]

## 2020-02-18 NOTE — Progress Notes (Signed)
Physical Therapy Treatment Patient Details Name: Brent Wampole Sr. MRN: [id number] DOB: [birth year] Today's Date: 02/18/2020    History of Present Illness The pt is a 70 yo male presenting with R-sided arm and leg weakness and R-sided facial droop. CT revealed no acute infarct, awaiting MRI, suspected L-sided lacunar infarct. tPA given 1810 1/30. PMH includes: HTN, HLD, DM, gout, and former smoker.    PT Comments    Pt tolerates treatment well with improvement in RLE strength, transfer quality, and gait tolerance. Pt demonstrates a preference for a R lateral lean despite R weakness. PT initiates gait training with use of railing to provide increased support and to promote more of a neutral or left sided lean. Pt does not demonstrate RLE buckling at this time but remains at a high falls risk due to imbalance and R sided weakness. Pt will benefit from initiation of gait training with a hemi-walker vs RW pending progress with RUE strength. PT continues to recommend CIR placement.   Follow Up Recommendations  CIR     Equipment Recommendations  Other (comment);3in1 (PT);Wheelchair (measurements PT) (hemiwalker)    Recommendations for Other Services       Precautions / Restrictions Precautions Precautions: Fall Precaution Comments: R-sided weakness Restrictions Weight Bearing Restrictions: No    Mobility  Bed Mobility               General bed mobility comments: pt received and left in recliner  Transfers Overall transfer level: Needs assistance Equipment used: 1 person hand held assist Transfers: Sit to/from Stand;Stand Pivot Transfers Sit to Stand: Min assist Stand pivot transfers: Mod assist       General transfer comment: PT provides cues for hand and foot placement during sit to stand transfers. PT provides hand hold for SPT to recliner as well as assistance in weight shifting to maintain balance  Ambulation/Gait Ambulation/Gait assistance: Mod assist;Min  assist Gait Distance (Feet): 36 Feet (2 additional trials of 10' with railing and one trial of 10' with hand hold) Assistive device: 1 person hand held assist (railing for all trials but initial 10' with hand hold) Gait Pattern/deviations: Step-to pattern;Decreased dorsiflexion - right;Decreased weight shift to left;Narrow base of support Gait velocity: reduced Gait velocity interpretation: <1.31 ft/sec, indicative of household ambulator General Gait Details: with initial ambulation with hand hold pt with strong R lateral lean, reduced R foot clearance. PT provides tactile cues and assistance with weight shift to L. With use of railing pt with more even weight distribution, improved clearance of L foot   Stairs             Wheelchair Mobility    Modified Rankin (Stroke Patients Only) Modified Rankin (Stroke Patients Only) Pre-Morbid Rankin Score: No symptoms Modified Rankin: Moderately severe disability     Balance Overall balance assessment: Needs assistance Sitting-balance support: Single extremity supported;Feet supported Sitting balance-Leahy Scale: Poor Sitting balance - Comments: reliant on UE support   Standing balance support: Single extremity supported Standing balance-Leahy Scale: Poor Standing balance comment: minA with LUE support of railing or hand hold                            Cognition Arousal/Alertness: Awake/alert Behavior During Therapy: WFL for tasks assessed/performed Overall Cognitive Status: Impaired/Different from baseline Area of Impairment: Memory                     Memory: Decreased short-term memory  Exercises      General Comments General comments (skin integrity, edema, etc.): VSS on RA      Pertinent Vitals/Pain Pain Assessment: No/denies pain    Home Living       Type of Home: House              Prior Function            PT Goals (current goals can now be found in the care  plan section) Acute Rehab PT Goals Patient Stated Goal: return home with independence Progress towards PT goals: Progressing toward goals    Frequency    Min 4X/week      PT Plan Current plan remains appropriate    Co-evaluation              AM-PAC PT "6 Clicks" Mobility   Outcome Measure  Help needed turning from your back to your side while in a flat bed without using bedrails?: A Little Help needed moving from lying on your back to sitting on the side of a flat bed without using bedrails?: A Lot Help needed moving to and from a bed to a chair (including a wheelchair)?: A Lot Help needed standing up from a chair using your arms (e.g., wheelchair or bedside chair)?: A Little Help needed to walk in hospital room?: A Lot Help needed climbing 3-5 steps with a railing? : Total 6 Click Score: 13    End of Session Equipment Utilized During Treatment: Gait belt Activity Tolerance: Patient tolerated treatment well Patient left: in chair;with call bell/phone within reach;with chair alarm set Nurse Communication: Mobility status PT Visit Diagnosis: Hemiplegia and hemiparesis;Difficulty in walking, not elsewhere classified (R26.2) Hemiplegia - Right/Left: Right Hemiplegia - dominant/non-dominant: Dominant Hemiplegia - caused by: Cerebral infarction     Time: 9628-3662 PT Time Calculation (min) (ACUTE ONLY): 35 min  Charges:  $Gait Training: 8-22 mins $Therapeutic Activity: 8-22 mins                     Zenaida Niece, PT, DPT Acute Rehabilitation Pager: [phone]    Zenaida Niece 02/18/2020, 10:53 AM

## 2020-02-19 ENCOUNTER — Encounter (HOSPITAL_COMMUNITY)
Admission: EM | Disposition: A | Discharge status: Rehabilitation Facility | Payer: Self-pay | Source: Home / Self Care | Attending: Neurology

## 2020-02-19 DIAGNOSIS — Z9114 Patient's other noncompliance with medication regimen: Secondary | ICD-10-CM | POA: Diagnosis not present

## 2020-02-19 DIAGNOSIS — R29704 NIHSS score 4: Secondary | ICD-10-CM | POA: Diagnosis not present

## 2020-02-19 DIAGNOSIS — I639 Cerebral infarction, unspecified: Secondary | ICD-10-CM | POA: Diagnosis not present

## 2020-02-19 DIAGNOSIS — M109 Gout, unspecified: Secondary | ICD-10-CM | POA: Diagnosis not present

## 2020-02-19 DIAGNOSIS — I6381 Other cerebral infarction due to occlusion or stenosis of small artery: Secondary | ICD-10-CM | POA: Diagnosis not present

## 2020-02-19 DIAGNOSIS — I10 Essential (primary) hypertension: Secondary | ICD-10-CM | POA: Diagnosis not present

## 2020-02-19 DIAGNOSIS — G8191 Hemiplegia, unspecified affecting right dominant side: Secondary | ICD-10-CM | POA: Diagnosis not present

## 2020-02-19 DIAGNOSIS — R29810 Facial weakness: Secondary | ICD-10-CM | POA: Diagnosis not present

## 2020-02-19 DIAGNOSIS — E119 Type 2 diabetes mellitus without complications: Secondary | ICD-10-CM | POA: Diagnosis not present

## 2020-02-19 DIAGNOSIS — E785 Hyperlipidemia, unspecified: Secondary | ICD-10-CM | POA: Diagnosis not present

## 2020-02-19 HISTORY — PX: LOOP RECORDER INSERTION: EP1214

## 2020-02-19 LAB — BASIC METABOLIC PANEL
Anion gap: 10 (ref 5–15)
BUN: 15 mg/dL (ref 8–23)
CO2: 22 mmol/L (ref 22–32)
Calcium: 9.4 mg/dL (ref 8.9–10.3)
Chloride: 106 mmol/L (ref 98–111)
Creatinine, Ser: 1.01 mg/dL (ref 0.61–1.24)
GFR, Estimated: >60 mL/min (ref >60)
Glucose, Bld: 100 mg/dL — ABNORMAL HIGH (ref 70–99)
Potassium: 3.8 mmol/L (ref 3.5–5.1)
Sodium: 138 mmol/L (ref 135–145)

## 2020-02-19 SURGERY — LOOP RECORDER INSERTION

## 2020-02-19 MED ORDER — LOSARTAN POTASSIUM 50 MG PO TABS
100.0000 mg | ORAL_TABLET | Freq: Every day | ORAL | Status: DC
  Administered 2020-02-19 – 2020-02-21 (×3): 100 mg via ORAL
  Filled 2020-02-19 (×3): qty 2

## 2020-02-19 MED ORDER — LIDOCAINE-EPINEPHRINE 1 %-1:100000 IJ SOLN
INTRAMUSCULAR | Status: AC
  Filled 2020-02-19: qty 1

## 2020-02-19 MED ORDER — PANTOPRAZOLE SODIUM 40 MG PO TBEC
40.0000 mg | DELAYED_RELEASE_TABLET | Freq: Every day | ORAL | Status: DC
  Administered 2020-02-19 – 2020-02-20 (×2): 40 mg via ORAL
  Filled 2020-02-19 (×2): qty 1

## 2020-02-19 MED ORDER — LIDOCAINE-EPINEPHRINE 1 %-1:100000 IJ SOLN
INTRAMUSCULAR | Status: DC | PRN
  Administered 2020-02-19: 30 mL

## 2020-02-19 SURGICAL SUPPLY — 2 items
MONITOR REVEAL LINQ II (Prosthesis & Implant Heart) ×1 IMPLANT
PACK LOOP INSERTION (CUSTOM PROCEDURE TRAY) ×2 IMPLANT

## 2020-02-19 NOTE — Progress Notes (Signed)
Physical Therapy Treatment Patient Details Name: Brent Haberer Sr. MRN: [id number] DOB: 05/06/1950 Today's Date: 02/19/2020    History of Present Illness The pt is a 70 yo male presenting with R-sided arm and leg weakness and R-sided facial droop. CT revealed no acute infarct, awaiting MRI, suspected L-sided lacunar infarct. tPA given 1810 1/30. PMH includes: HTN, HLD, DM, gout, and former smoker.    PT Comments    Pt more tired today from busy schedule.  Emphasis today on improving technique with transitions, sit to stance and progression of gait stability and quality.    Follow Up Recommendations  CIR     Equipment Recommendations  Other (comment);3in1 (PT);Wheelchair (measurements PT) (TBD)    Recommendations for Other Services       Precautions / Restrictions Precautions Precautions: Fall Precaution Comments: R-sided weakness    Mobility  Bed Mobility Overal bed mobility: Needs Assistance Bed Mobility: Rolling;Sidelying to Sit;Sit to Sidelying Rolling: Min assist Sidelying to sit: Mod assist     Sit to sidelying: Mod assist General bed mobility comments: cues for technique, min rolling assist, moderate assist/stability up via L elbow.  Assist to bet R LE into bed.  Transfers Overall transfer level: Needs assistance Equipment used: Right platform walker Transfers: Sit to/from Stand Sit to Stand: Min assist         General transfer comment: cues for hand and foot placement, position at EOB, assist to come forward and some boost.  Ambulation/Gait Ambulation/Gait assistance: Mod assist;+2 safety/equipment Gait Distance (Feet): 50 Feet (x2) Assistive device: Right platform walker Gait Pattern/deviations: Step-to pattern;Step-through pattern;Decreased step length - right;Decreased step length - left;Decreased stance time - right;Decreased stride length Gait velocity: reduced Gait velocity interpretation: <1.31 ft/sec, indicative of household ambulator General  Gait Details: paretic gait with weak toe off and heel contact, irregular step legs step to vs some step through.  Pt listing R generally, needing w/shift assist L to unweight for swing through.  Pt degraded quickly on return with less L w/shift, less clearance right foot and degraded heel toe patterning.   Stairs             Wheelchair Mobility    Modified Rankin (Stroke Patients Only) Modified Rankin (Stroke Patients Only) Pre-Morbid Rankin Score: No symptoms Modified Rankin: Moderately severe disability     Balance Overall balance assessment: Needs assistance Sitting-balance support: No upper extremity supported;Single extremity supported;Feet supported Sitting balance-Leahy Scale: Fair Sitting balance - Comments: less reliance on UE support   Standing balance support: Single extremity supported;Bilateral upper extremity supported Standing balance-Leahy Scale: Poor                              Cognition Arousal/Alertness: Awake/alert Behavior During Therapy: WFL for tasks assessed/performed Overall Cognitive Status: Within Functional Limits for tasks assessed                                        Exercises      General Comments General comments (skin integrity, edema, etc.): vss      Pertinent Vitals/Pain Pain Assessment: Faces Faces Pain Scale: No hurt Pain Intervention(s): Monitored during session    Home Living                      Prior Function  PT Goals (current goals can now be found in the care plan section) Acute Rehab PT Goals Patient Stated Goal: get to rehab and return home independent\ PT Goal Formulation: With patient Time For Goal Achievement: 03/02/20 Potential to Achieve Goals: Good Progress towards PT goals: Progressing toward goals    Frequency    Min 4X/week      PT Plan Current plan remains appropriate    Co-evaluation              AM-PAC PT "6 Clicks" Mobility    Outcome Measure  Help needed turning from your back to your side while in a flat bed without using bedrails?: A Little Help needed moving from lying on your back to sitting on the side of a flat bed without using bedrails?: A Lot Help needed moving to and from a bed to a chair (including a wheelchair)?: A Lot Help needed standing up from a chair using your arms (e.g., wheelchair or bedside chair)?: A Little Help needed to walk in hospital room?: A Lot Help needed climbing 3-5 steps with a railing? : Total 6 Click Score: 13    End of Session     Patient left: in bed;with call bell/phone within reach;with bed alarm set;with family/visitor present Nurse Communication: Mobility status PT Visit Diagnosis: Hemiplegia and hemiparesis;Difficulty in walking, not elsewhere classified (R26.2) Hemiplegia - Right/Left: Right Hemiplegia - dominant/non-dominant: Dominant Hemiplegia - caused by: Cerebral infarction     Time: 4709-6283 PT Time Calculation (min) (ACUTE ONLY): 22 min  Charges:  $Gait Training: 8-22 mins                     02/19/2020  Brent Carne., PT Acute Rehabilitation Services [phone]  (pager) [phone]  (office)   Brent Mccormick 02/19/2020, 6:10 PM

## 2020-02-19 NOTE — H&P (Signed)
Physical Medicine and Rehabilitation Admission H&P     HPI: Brent Mccormick, Brent Mccormick. is a 70 year old right-handed male with history of gout, type 2 diabetes mellitus, hypertension, hyperlipidemia, quit smoking 54 years ago.  History taken from chart review and patient.  Patient lives with spouse.  Wife is an Therapist, sports.  1 level home 5 steps to entry.  Independent prior to admission and active.  He presented on 02/16/2020 with right hemiparesis and facial droop.  Head CT unremarkable for acute intracranial process.  Chronic infarct right corona radiata.  CT angiogram of head and neck negative for large vessel occlusion.  Patient did receive TPA.  MRI showed acute infarction left basal ganglia and radiating white matter tracts.  No mass-effect or hemorrhage.  Echocardiogram with ejection fraction of 60-65%, no wall motion abnormalities grade 1 diastolic dysfunction.  Admission chemistries unremarkable except BUN 24 creatinine 1.41, WBC 10,800.  Neurology consulted maintain on aspirin 81 mg daily and Plavix 75 mg daily x3 weeks then Plavix alone.  Patient did undergo a loop recorder placement per cardiology services 02/19/2020.  Lovenox for DVT prophylaxis.  Therapy evaluations completed due to patient's right hemiparesis, he was admitted for a comprehensive rehab program.  Please see preadmission assessment from earlier today as well.  Review of Systems  Constitutional: Positive for malaise/fatigue. Negative for chills and fever.  HENT: Negative for hearing loss.   Eyes: Negative for blurred vision and double vision.  Respiratory: Negative for cough and shortness of breath.   Cardiovascular: Negative for chest pain, palpitations and leg swelling.  Gastrointestinal: Positive for constipation. Negative for heartburn, nausea and vomiting.  Genitourinary: Negative for dysuria, flank pain and hematuria.  Musculoskeletal: Positive for joint pain and myalgias.  Skin: Negative for rash.  Neurological: Positive for  focal weakness and weakness. Negative for sensory change and speech change.  All other systems reviewed and are negative.  Past Medical History:  Diagnosis Date  . Carpal tunnel syndrome   . Gout   . Hyperlipidemia    on meds  . Hypertension    on meds   Past Surgical History:  Procedure Laterality Date  . COLONOSCOPY  2015   DJ-MAC-polyps  . LOOP RECORDER INSERTION N/A 02/19/2020   Procedure: LOOP RECORDER INSERTION;  Surgeon: Constance Haw, MD;  Location: [facility];  Service: Cardiovascular;  Laterality: N/A;  . no prior surgery    . POLYPECTOMY  2015   DJ-MAC-polyps   Family History  Problem Relation Age of Onset  . Ovarian cancer Mother   . Heart disease Father   . Hypertension Father   . Colon polyps Sister 28  . Colon cancer Neg Hx   . Esophageal cancer Neg Hx   . Rectal cancer Neg Hx   . Stomach cancer Neg Hx    Social History:  reports that he quit smoking about 54 years ago. He has never used smokeless tobacco. He reports current alcohol use. He reports that he does not use drugs. Allergies: No Known Allergies Facility-Administered Medications Prior to Admission  Medication Dose Route Frequency Provider Last Rate Last Admin  . triamcinolone acetonide (KENALOG) 10 MG/ML injection 10 mg  10 mg Other Once Landis Martins, DPM       Medications Prior to Admission  Medication Sig Dispense Refill  . Ascorbic Acid (VITAMIN C) 1000 MG tablet Take 1,000 mg by mouth daily.    Marland Kitchen aspirin 81 MG tablet Take 81 mg by mouth daily.    Marland Kitchen  cholecalciferol (VITAMIN D3) 25 MCG (1000 UNIT) tablet Take 1,000 Units by mouth daily.    . colchicine 0.6 MG tablet Take 1 tablet (0.6 mg total) by mouth every other day. 45 tablet 3  . losartan (COZAAR) 100 MG tablet Take 1 tablet (100 mg total) by mouth daily. 90 tablet 3  . Multiple Vitamin (MULTIVITAMIN) tablet Take 1 tablet by mouth daily.    . rosuvastatin (CRESTOR) 5 MG tablet Take 1 tablet (5 mg total) by mouth at  bedtime. 90 tablet 3  . zinc gluconate 50 MG tablet Take 50 mg by mouth daily.      Drug Regimen Review Drug regimen was reviewed and remains appropriate with no significant issues identified  Home: Home Living Family/patient expects to be discharged to:: Private residence Living Arrangements: Spouse/significant other,Children Available Help at Discharge: Family,Available 24 hours/day Type of Home: House Home Access: Stairs to enter [employer] of Steps: 5 or 8 Entrance Stairs-Rails: Can reach both Home Layout: One level Bathroom Shower/Tub: Multimedia programmer: Standard Home Equipment: Shower seat - built in,Grab bars - tub/shower,Crutches,Hand held shower head Additional Comments: son that is 45 yo that works but lives in the home. 2 cats inside and 2 outside dogs  Lives With: Spouse   Functional History: Prior Function Level of Independence: Independent Comments: works from [employer]. enjoys reading historical non-fiction. Wife is RN at [employer], can be with pt 24/7 tues-fri and other family can be available to cover other days  Functional Status:  Mobility: Bed Mobility Overal bed mobility: Needs Assistance Bed Mobility: Supine to Sit Rolling: Min assist Sidelying to sit: Mod assist Supine to sit: Supervision,HOB elevated Sit to supine: Mod assist Sit to sidelying: Mod assist General bed mobility comments: use of bed rails and increased time Transfers Overall transfer level: Needs assistance Equipment used: Right platform walker,None Transfers: Sit to/from Stand Sit to Stand: Min assist,Min guard Stand pivot transfers: Mod assist General transfer comment: minA from bed with platform walker, minG from recliner with use of armrest Ambulation/Gait Ambulation/Gait assistance: Min assist Gait Distance (Feet): 125 Feet Assistive device: Right platform walker Gait Pattern/deviations: Step-through pattern General Gait Details: pt with  step-to gait initially, PT encourages a more fluid step-through gait pattern which pt is able to achieve. With fatigue pt demonstrates R foot drag and multiple losses of balance due to catching his foot on ground or narrowed BOS Gait velocity: reduced Gait velocity interpretation: <1.8 ft/sec, indicate of risk for recurrent falls    ADL: ADL Overall ADL's : Needs assistance/impaired Eating/Feeding: Moderate assistance,Bed level Eating/Feeding Details (indicate cue type and reason): positioned upright with tray positioned to help with self feeding Grooming: Maximal assistance,Sitting Toilet Transfer: Maximal assistance Toilet Transfer Details (indicate cue type and reason): pt transferred from bed to toilet with R LE blocked. pt needed step by step sequence to progress Functional mobility during ADLs: Maximal assistance General ADL Comments: pt motivated to transfer to the bathroom this session. wife present during session and very helpful with line management. Pt with R inattention  Cognition: Cognition Overall Cognitive Status: Within Functional Limits for tasks assessed Arousal/Alertness: Awake/alert Orientation Level: Oriented X4 Attention: Sustained Sustained Attention: Appears intact Memory: Impaired Memory Impairment: Retrieval deficit Awareness: Appears intact Problem Solving: Appears intact Safety/Judgment: Appears intact Cognition Arousal/Alertness: Awake/alert Behavior During Therapy: WFL for tasks assessed/performed Overall Cognitive Status: Within Functional Limits for tasks assessed Area of Impairment: Awareness Memory: Decreased short-term memory Awareness: Emergent General Comments: pt with generally functional cog  through session, followed all commands or expressed inability. benefits from encouragement of use of RUE  Physical Exam: Blood pressure 126/84, pulse (!) 116, temperature 98 F (36.7 C), temperature source Oral, resp. rate 18, weight 78.4 kg, SpO2 98  %. Physical Exam Vitals reviewed.  Constitutional:      General: He is not in acute distress.    Appearance: He is normal weight. He is not ill-appearing.  HENT:     Head: Normocephalic and atraumatic.     Right Ear: External ear normal.     Left Ear: External ear normal.     Nose: Nose normal.  Eyes:     General:        Right eye: No discharge.        Left eye: No discharge.     Extraocular Movements: Extraocular movements intact.  Cardiovascular:     Rate and Rhythm: Regular rhythm. Tachycardia present.  Pulmonary:     Effort: Pulmonary effort is normal. No respiratory distress.     Breath sounds: No stridor.  Abdominal:     General: Abdomen is flat. Bowel sounds are normal. There is no distension.  Musculoskeletal:     Cervical back: Normal range of motion and neck supple.     Comments: Right wrist with tenderness No edema  Skin:    General: Skin is warm and dry.  Neurological:     Mental Status: He is alert.     Comments: Alert Makes good eye contact with examiner.   Follows commands.   Oriented to person place and time.   Fair insight and awareness of deficits. Motor: LUE/LLE: 5/5 proximal distal RUE: 3+/5 proximal to distal RLE: 4+/5 proximal to distal  Psychiatric:        Mood and Affect: Mood normal.        Behavior: Behavior normal.        Thought Content: Thought content normal.     Results for orders placed or performed during the hospital encounter of 02/16/20 (from the past 48 hour(s))  Basic metabolic panel     Status: Abnormal   Collection Time: 02/20/20  3:57 AM  Result Value Ref Range   Sodium 136 135 - 145 mmol/L   Potassium 3.5 3.5 - 5.1 mmol/L   Chloride 102 98 - 111 mmol/L   CO2 23 22 - 32 mmol/L   Glucose, Bld 122 (H) 70 - 99 mg/dL    Comment: Glucose reference range applies only to samples taken after fasting for at least 8 hours.   BUN 15 8 - 23 mg/dL   Creatinine, Ser 1.12 0.61 - 1.24 mg/dL   Calcium 8.9 8.9 - 10.3 mg/dL   GFR,  Estimated >60 >60 mL/min    Comment: (NOTE) Calculated using the CKD-EPI Creatinine Equation (2021)    Anion gap 11 5 - 15    Comment: Performed at [facility] [address] [place], [place] [address]  Basic metabolic panel     Status: Abnormal   Collection Time: 02/21/20  5:11 AM  Result Value Ref Range   Sodium 138 135 - 145 mmol/L   Potassium 3.8 3.5 - 5.1 mmol/L   Chloride 104 98 - 111 mmol/L   CO2 20 (L) 22 - 32 mmol/L   Glucose, Bld 117 (H) 70 - 99 mg/dL    Comment: Glucose reference range applies only to samples taken after fasting for at least 8 hours.   BUN 24 (H) 8 - 23 mg/dL  Creatinine, Ser 1.11 0.61 - 1.24 mg/dL   Calcium 8.9 8.9 - 10.3 mg/dL   GFR, Estimated >60 >60 mL/min    Comment: (NOTE) Calculated using the CKD-EPI Creatinine Equation (2021)    Anion gap 14 5 - 15    Comment: Performed at [facility] [address], [place], [place] [address]   No results found.     Medical Problem List and Plan: 1.  Right hemiparesis secondary to ischemic infarct left basal ganglia likely due to small vessel disease.  Status post loop recorder  -patient may shower  -ELOS/Goals: 10-14 days/supervision/mod I  Admit to CIR 2.  Antithrombotics: -DVT/anticoagulation: Lovenox  -antiplatelet therapy: Aspirin 81 mg daily and Plavix 75 mg daily x3 weeks then Plavix alone 3. Pain Management: Tylenol as needed 4. Mood: Provide emotional support  -antipsychotic agents: N/A 5. Neuropsych: This patient is capable of making decisions on his own behalf. 6. Skin/Wound Care: Routine skin checks 7. Fluids/Electrolytes/Nutrition: Routine in and outs  CMP ordered for tomorrow 8.  Hyperlipidemia: Crestor 9.  History of gout.  Colchicine 0.6 mg every other day.    Monitor for flares 10.  Essential hypertension.  Cozaar 100 mg daily  Monitor with increased mobility 12.  Tachycardia-likely secondary to deconditioning  ECG on 2/4 reviewed-normal 15.   Leukocytosis  WBCs 10.9 on 2/1, CBC ordered  Afebrile, no signs/symptoms of infection   Cathlyn Parsons, PA-C 02/21/2020  I have personally performed a face to face diagnostic evaluation, including, but not limited to relevant history and physical exam findings, of this patient and developed relevant assessment and plan.  Additionally, I have reviewed and concur with the physician assistant's documentation above.  Delice Lesch, MD, ABPMR

## 2020-02-19 NOTE — Progress Notes (Signed)
Inpatient Rehab Admissions:  Inpatient Rehab Consult received.  I met with patient and his wife at the bedside for rehabilitation assessment and to discuss goals and expectations of an inpatient rehab admission.  We discussed ELOS to be about 10-14 days with goals of supervision to mod I.  Pt reports and spouse, Brent Mccormick, confirms that pt will have 24/7 supervision between several family members, including his brother and sister.  We discussed needing insurance authorization from [facility] and this has been started.  Will follow for timing of admission pending insurance approval and bed availability.   Signed: Shann Medal, PT, DPT Admissions Coordinator [phone] 02/19/20  12:14 PM

## 2020-02-19 NOTE — Progress Notes (Signed)
Received from [facility] ICU in w/c accompanied by nurse and wife.  Oriented to room.  No complaints.

## 2020-02-19 NOTE — Progress Notes (Signed)
OT Cancellation Note  Patient Details Name: Brent Nevares Sr. MRN: [id number] DOB: 1950/02/05   Cancelled Treatment:    Reason Eval/Treat Not Completed: Patient at procedure or test/ unavailable  Jeri Modena 02/19/2020, 2:35 PM   Fleeta Emmer, OTR/L  Acute Rehabilitation Services Pager: [phone] Office: [phone] .

## 2020-02-19 NOTE — Progress Notes (Signed)
STROKE TEAM PROGRESS NOTE   INTERVAL HISTORY No acute overnight events. Patient is sitting up in chair at bedside talking on his phone. He denies new symptoms of concern. He is awaiting CIR placement. We discussed expected stroke symptoms and recovery. He agrees to consideration of heart monitoring via Loop placement.  Neurological exam is stable.  Vital signs stable  Vitals:   02/19/20 0000 02/19/20 0015 02/19/20 0300 02/19/20 0400  BP:  (!) 155/88  (!) 170/95  Pulse:  80 74 64  Resp:  19 16 12   Temp: 98.1 F (36.7 C)   98 F (36.7 C)  TempSrc: Oral   Oral  SpO2:  97% 97% 95%  Weight:       CBC:  Recent Labs  Lab 02/16/20 1754 02/16/20 1800 02/18/20 1116  WBC 10.8*  --  10.9*  NEUTROABS 7.2  --  8.4*  HGB 16.0 16.3 15.2  HCT 48.9 48.0 43.2  MCV 90.7  --  89.6  PLT 265  --  657   Basic Metabolic Panel:  Recent Labs  Lab 02/18/20 1116 02/19/20 0247  NA 140 138  K 3.9 3.8  CL 108 106  CO2 23 22  GLUCOSE 140* 100*  BUN 16 15  CREATININE 1.05 1.01  CALCIUM 9.1 9.4   Lipid Panel:  Recent Labs  Lab 02/17/20 0304  CHOL 174  TRIG 64  HDL 40*  CHOLHDL 4.4  VLDL 13  LDLCALC 121*   HgbA1c:  Recent Labs  Lab 02/17/20 0304  HGBA1C 5.5   Urine Drug Screen: None detected  IMAGING past 24 hours  CT Head code stroke 02/16/2019  IMPRESSION: 1. No acute abnormality identified 2. ASPECTS is 10 3. Chronic infarct in the right corona radiata.  CT Angio Head and Neck  02/16/2020  IMPRESSION: 1. Negative for intracranial large vessel occlusion 2. Intracranial atherosclerotic disease with moderate stenosis in the P2 segment bilaterally. Mild atherosclerotic irregularity in the anterior and middle cerebral arteries. 3. No significant carotid or vertebral artery stenosis in the neck.  MRI Brain 02/17/2020  IMPRESSION: 1. Acute infarction in the left basal ganglia and radiating white matter tracts. No mass effect or hemorrhage. 2. Mild chronic small-vessel  ischemic changes elsewhere affecting the cerebral hemispheric white matter and right basal ganglia.  Echocardiogram 02/17/2020   PHYSICAL EXAM Constitutional: Appears well-developed and well-nourished.  Psych: Affect appropriate to situation Eyes: No scleral injection HENT: No OP obstruction, good dentition MSK: no joint deformities.  Cardiovascular: Normal rate and regular rhythm.  Respiratory: Effort normal, non-labored breathing GI: Soft.  No distension. There is no tenderness.  Skin: WDI  Mental Status: Alert, oriented, thought content appropriate.  Speech fluent without evidence of aphasia.  Able to follow 3 step commands without difficulty. Cranial Nerves: II:  Visual fields grossly normal, pupils equal, round, reactive to light and accommodation III,IV, VI: ptosis not present, extra-ocular motions intact bilaterally V,VII: smile asymmetric, right facial droop, facial light touch sensation normal bilaterally VIII: hearing normal bilaterally IX,X: uvula rises symmetrically XI: bilateral shoulder shrug XII: midline tongue extension without atrophy or fasciculations  Motor: Right : Upper extremity   2+/5    Left:     Upper extremity   5/5  Lower extremity   3+/5     Lower extremity   5/5 Tone and bulk:normal tone throughout; no atrophy noted Sensory: Light touch intact throughout, bilaterally  Plantars: Right: downgoing   Left: downgoing Cerebellar: normal finger-to-nose Gait: Deferred  ASSESSMENT/PLAN Mr. Brent Jeziorski Sr.  is a 70 y.o. male with PMHx of hypertension, hyperlipidemia, diabetes, gout, former smoker quit in 1968 presented with right arm and right leg weakness and right facial droop.  Patient was noncompliant with his statins but claims was taking aspirin daily.  He received TPA at 6:13 p.m. on 02/16/2020.  CT head does not show any acute abnormality.  CTA does not show any intracranial large vessel occlusion.  MRI post TPA shows acute infarction in left basal  ganglia and radiating white matter tracts.   Stroke: Acute ischemic infarct in left basal ganglia, likely due cryptogenic given large size of the infarct code Stroke CT head No acute abnormality.ASPECTS 10.   CTA head & neck :  Negative for intracranial large vessel occlusion  MRI : Acute infarction in the left basal ganglia and radiating white matter tracts.  2D Echo : 60-65% There is severe left ventricular hypertrophy of the basal-septal segment. Left ventricular diastolic parameters are consistent with Grade I diastolic dysfunction (impaired relaxation).  LDL 121  HgbA1c 5.5  VTE prophylaxis -SCDs    Diet   Diet Heart Room service appropriate? Yes with Assist; Fluid consistency: Thin    aspirin 81 mg daily prior to admission, For now due to post tPA. Recommend aspirin 81 mg and Plavix 75 mg for 3 weeks and then continue Plavix alone.  Referral for possible Loop placement consideration is pending  Therapy recommendations: CIR  Disposition: Pending  Hypertension  Home meds: Losartan 100 mg  Stable . BP <180/105 for first 24 hours post tPA . Long-term BP goal normotensive  Hyperlipidemia  Home meds:  Crestor 5 mg  LDL 121, goal < 70  Crestor 20 mg daily  Continue statin at discharge  Diabetes type II Controlled  Home meds: None  HgbA1c 5.5, goal < 7.0  CBGs Recent Labs    02/16/20 1752  GLUCAP 136*      SSI   Other Stroke Risk Factors  Advanced Age >/= 12   Former smoker, quit in 1968  ETOH use and will be advised to stop drinking alcohol.  Possible CHF,Grade I diastolic dysfunction on Echo  Other Active Problems  Gout : Colchicine 0.6 mg tablet daily.  Hospital day # 3 30 therapies.  Mobilize out of bed.  Loop recorder is paroxysmal A. fib and discharge.  Continue aspirin Plavix for now.  Statin for elevated lipids.  Discussed with Dr. Curt Bears.  Greater than 50% time during this 25-minute visit was spent in counseling and coordination  of care will cryptogenic stroke anticoagulation needs inpatient rehab and answering questions.  Antony Contras, MD   To contact Stroke Continuity provider, please refer to [email or web address] After hours, contact General Neurology

## 2020-02-19 NOTE — Discharge Instructions (Signed)
Wound care instructions (heart monitor implant) Keep incision clean and dry for 3 days. You can remove outer dressing tomorrow. Leave steri-strips (little pieces of tape) on until seen in the office for wound check appointment. Call the office ([phone]) for redness, drainage, swelling, or fever.  

## 2020-02-19 NOTE — Consult Note (Addendum)
ELECTROPHYSIOLOGY CONSULT NOTE  Patient ID: Brent Aloe Sr. MRN: [id number], DOB/AGE: 05/07/1950   Admit date: 02/16/2020 Date of Consult: 02/19/2020  Primary Physician: Dorothyann Peng, NP Primary Cardiologist: none Reason for Consultation: Cryptogenic stroke ; recommendations regarding Implantable Loop Recorder, requested by Dr. Leonie Man  History of Present Illness Brent Disney Sr. was admitted on 02/16/2020 with L sided weakness and stroke >> tPA   PMHx includes: HTN, HLD, DM, very remote former smoker.   Neurology noted: Acute ischemic infarct in left basal ganglia, likely due to small vessel disease.  he has undergone workup for stroke including echocardiogram and carotid angio.  The patient has been monitored on telemetry which has demonstrated sinus rhythm with no arrhythmias.   I have personally communicated with stroke team, confirming request for loop consult/placement  Neurology has deferred TEE.   Echocardiogram this admission demonstrated  IMPRESSIONS   1. Left ventricular ejection fraction, by estimation, is 60 to 65%. The  left ventricle has normal function. The left ventricle has no regional  wall motion abnormalities. There is severe left ventricular hypertrophy of  the basal-septal segment. Left  ventricular diastolic parameters are consistent with Grade I diastolic  dysfunction (impaired relaxation).   2. Right ventricular systolic function is normal. The right ventricular  size is normal. Tricuspid regurgitation signal is inadequate for assessing  PA pressure.   3. The mitral valve is normal in structure. No evidence of mitral valve  regurgitation. No evidence of mitral stenosis.   4. The aortic valve is tricuspid. There is moderate calcification of the  aortic valve. There is moderate thickening of the aortic valve. Aortic  valve regurgitation is not visualized. Mild to moderate aortic valve  sclerosis/calcification is present,  without any evidence of  aortic stenosis.   5. The inferior vena cava is normal in size with greater than 50%  respiratory variability, suggesting right atrial pressure of 3 mmHg.   IAS/Shunts: The interatrial septum appears to be lipomatous. No atrial  level shunt detected by color flow Doppler.    Lab work is reviewed.   Prior to admission, the patient denies chest pain, shortness of breath, dizziness,  palpitations, or syncope. They are recovering from their stroke with plans to CIR at discharge.   Past Medical History:  Diagnosis Date   Carpal tunnel syndrome    Gout    Hyperlipidemia    on meds   Hypertension    on meds     Surgical History:  Past Surgical History:  Procedure Laterality Date   COLONOSCOPY  2015   DJ-MAC-polyps   no prior surgery     POLYPECTOMY  2015   DJ-MAC-polyps     Facility-Administered Medications Prior to Admission  Medication Dose Route Frequency Provider Last Rate Last Admin   triamcinolone acetonide (KENALOG) 10 MG/ML injection 10 mg  10 mg Other Once Landis Martins, DPM       Medications Prior to Admission  Medication Sig Dispense Refill Last Dose   Ascorbic Acid (VITAMIN C) 1000 MG tablet Take 1,000 mg by mouth daily.   02/16/2020 at Unknown time   aspirin 81 MG tablet Take 81 mg by mouth daily.   02/16/2020 at Unknown time   cholecalciferol (VITAMIN D3) 25 MCG (1000 UNIT) tablet Take 1,000 Units by mouth daily.   02/16/2020 at Unknown time   colchicine 0.6 MG tablet Take 1 tablet (0.6 mg total) by mouth every other day. 45 tablet 3 02/16/2020 at Unknown time   losartan (COZAAR)  100 MG tablet Take 1 tablet (100 mg total) by mouth daily. 90 tablet 3 02/16/2020 at Unknown time   Multiple Vitamin (MULTIVITAMIN) tablet Take 1 tablet by mouth daily.   02/16/2020 at Unknown time   rosuvastatin (CRESTOR) 5 MG tablet Take 1 tablet (5 mg total) by mouth at bedtime. 90 tablet 3 01/26/2020   zinc gluconate 50 MG tablet Take 50 mg by mouth daily.   02/16/2020 at Unknown time     Inpatient Medications:    stroke: mapping our [place] stages of recovery book   Does not apply Once   aspirin EC  81 mg Oral Daily   Chlorhexidine Gluconate Cloth  6 each Topical Daily   cholecalciferol  1,000 Units Oral Daily   clopidogrel  75 mg Oral Daily   colchicine  0.6 mg Oral QODAY   enoxaparin (LOVENOX) injection  40 mg Subcutaneous Daily   influenza vaccine adjuvanted  0.5 mL Intramuscular Tomorrow-1000   pantoprazole (PROTONIX) IV  40 mg Intravenous QHS   rosuvastatin  20 mg Oral Daily   sodium chloride flush  3 mL Intravenous Once    Allergies: No Known Allergies  Social History   Socioeconomic History   Marital status: Married    Spouse name: Not on file   Number of children: Not on file   Years of education: Not on file   Highest education level: Not on file  Occupational History   Not on file  Tobacco Use   Smoking status: Former Smoker    Quit date: 01/17/1966    Years since quitting: 54.1   Smokeless tobacco: Never Used  Vaping Use   Vaping Use: Never used  Substance and Sexual Activity   Alcohol use: Yes    Comment: occassionally   Drug use: No   Sexual activity: Yes  Other Topics Concern   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    Married    Two children, live locally.       Three dogs and two cats    Social Determinants of Health   Financial Resource Strain: Not on file  Food Insecurity: Not on file  Transportation Needs: Not on file  Physical Activity: Not on file  Stress: Not on file  Social Connections: Not on file  Intimate Partner Violence: Not on file     Family History  Problem Relation Age of Onset   Ovarian cancer Mother    Heart disease Father    Hypertension Father    Colon polyps Sister 53   Colon cancer Neg Hx    Esophageal cancer Neg Hx    Rectal cancer Neg Hx    Stomach cancer Neg Hx       Review of Systems: All other  systems reviewed and are otherwise negative except as noted above.  Physical Exam: Vitals:   02/19/20 0000 02/19/20 0015 02/19/20 0300 02/19/20 0400  BP:  (!) 155/88  (!) 170/95  Pulse:  80 74 64  Resp:  _0 Temp: 98.1 F (36.7 C)   98 F (36.7 C)  TempSrc: Oral   Oral  SpO2:  97% 97% 95%  Weight:        GEN- The patient is well appearing, alert and oriented x 3 today.   Head- normocephalic, atraumatic Eyes-  Sclera clear, conjunctiva pink Ears- hearing intact Oropharynx- clear Neck- supple Lungs- CTA b/l, normal  work of breathing Heart- RRR, no murmurs, rubs or gallops  GI- soft, NT, ND Extremities- no clubbing, cyanosis, or edema MS- no significant deformity or atrophy Skin- no rash or lesion Psych- euthymic mood, full affect   Labs:   Lab Results  Component Value Date   WBC 10.9 (H) 02/18/2020   HGB 15.2 02/18/2020   HCT 43.2 02/18/2020   MCV 89.6 02/18/2020   PLT 262 02/18/2020    Recent Labs  Lab 02/16/20 1754 02/16/20 1800 02/19/20 0247  NA 137   < > 138  K 3.9   < > 3.8  CL 103   < > 106  CO2 23   < > 22  BUN 24*   < > 15  CREATININE 1.41*   < > 1.01  CALCIUM 9.7   < > 9.4  PROT 7.8  --   --   BILITOT 0.5  --   --   ALKPHOS 56  --   --   ALT 21  --   --   AST 22  --   --   GLUCOSE 134*   < > 100*   < > = values in this interval not displayed.   No results found for: CKTOTAL, CKMB, CKMBINDEX, TROPONINI Lab Results  Component Value Date   CHOL 174 02/17/2020   CHOL 215 (H) 06/11/2019   CHOL 172 03/02/2018   Lab Results  Component Value Date   HDL 40 (L) 02/17/2020   HDL 44.70 06/11/2019   HDL 41.30 03/02/2018   Lab Results  Component Value Date   LDLCALC 121 (H) 02/17/2020   LDLCALC 102 (H) 03/02/2018   LDLCALC 121 (H) 01/20/2017   Lab Results  Component Value Date   TRIG 64 02/17/2020   TRIG 258.0 (H) 06/11/2019   TRIG 144.0 03/02/2018   Lab Results  Component Value Date   CHOLHDL 4.4 02/17/2020   CHOLHDL 5  06/11/2019   CHOLHDL 4 03/02/2018   Lab Results  Component Value Date   LDLDIRECT 134.0 06/11/2019   LDLDIRECT 127.4 05/18/2011    No results found for: DDIMER   Radiology/Studies:   MR BRAIN WO CONTRAST Result Date: 02/17/2020 CLINICAL DATA:  Follow-up stroke 24 hours post tPA. Right-sided weakness. EXAM: MRI HEAD WITHOUT CONTRAST TECHNIQUE: Multiplanar, multiecho pulse sequences of the brain and surrounding structures were obtained without intravenous contrast. COMPARISON:  CT studies done yesterday. FINDINGS: Brain: Diffusion imaging shows acute infarction in the left basal ganglia and radiating white matter tracts. No other acute insult. The brainstem and cerebellum are normal. Cerebral hemispheres elsewhere show few old small vessel infarctions of the white matter and of the right basal ganglia. No large vessel territory infarction. No mass lesion, hemorrhage, hydrocephalus or extra-axial collection. Vascular: Major vessels at the base of the brain show flow. Skull and upper cervical spine: Negative Sinuses/Orbits: Clear/normal Other: None IMPRESSION: 1. Acute infarction in the left basal ganglia and radiating white matter tracts. No mass effect or hemorrhage. 2. Mild chronic small-vessel ischemic changes elsewhere affecting the cerebral hemispheric white matter and right basal ganglia. Electronically Signed   By: Nelson Chimes M.D.   On: 02/17/2020 12:52     CT HEAD CODE STROKE WO CONTRAST Result Date: 02/16/2020 CLINICAL DATA:  Code stroke.  Acute neuro deficit EXAM: CT HEAD WITHOUT CONTRAST TECHNIQUE: Contiguous axial images were obtained from the base of the skull through the vertex without intravenous contrast. COMPARISON:  None. FINDINGS: Brain: Well-defined hypodensity in the right corona radiata  compatible with chronic white matter infarct. Negative for acute infarct, hemorrhage, or mass. Ventricle size normal. Vascular: Negative for hyperdense vessel. Atherosclerotic calcification in  the carotid and vertebral arteries. Skull: Negative Sinuses/Orbits: Paranasal sinuses clear.  Negative orbit Other: None ASPECTS ([place] Stroke Program [place] CT Score) - Ganglionic level infarction (caudate, lentiform nuclei, internal capsule, insula, M1-M3 cortex): 7 - Supraganglionic infarction (M4-M6 cortex): 3 Total score (0-10 with 10 being normal): 10 IMPRESSION: 1. No acute abnormality identified 2. ASPECTS is 10 3. Chronic infarct in the right corona radiata. 4. Code stroke imaging results were communicated on 02/16/2020 at 6:06 pm to provider Bhagat via text page Electronically Signed   By: Franchot Gallo M.D.   On: 02/16/2020 18:07    CT ANGIO HEAD CODE STROKE Result Date: 02/16/2020 CLINICAL DATA:  Acute neuro deficit. EXAM: CT ANGIOGRAPHY HEAD AND NECK TECHNIQUE: Multidetector CT imaging of the head and neck was performed using the standard protocol during bolus administration of intravenous contrast. Multiplanar CT image reconstructions and MIPs were obtained to evaluate the vascular anatomy. Carotid stenosis measurements (when applicable) are obtained utilizing NASCET criteria, using the distal internal carotid diameter as the denominator. CONTRAST:  61m OMNIPAQUE IOHEXOL 350 MG/ML SOLN COMPARISON:  CT head 02/16/2020 FINDINGS: CTA NECK FINDINGS Aortic arch: Standard branching. Imaged portion shows no evidence of aneurysm or dissection. No significant stenosis of the major arch vessel origins. Right carotid system: Mild atherosclerotic disease right carotid bifurcation without stenosis. Left carotid system: Mild atherosclerotic disease left carotid bifurcation without stenosis. Vertebral arteries: Both vertebral arteries are patent to the basilar without stenosis. Skeleton: Cervical spondylosis.  No acute skeletal abnormality. Other neck: Negative for mass or adenopathy. 8 mm calcification right thyroid nodule. No further imaging necessary. Upper chest: Lung apices clear bilaterally. Review of the  MIP images confirms the above findings CTA HEAD FINDINGS Anterior circulation: Mild atherosclerotic disease in the cavernous carotid bilaterally without stenosis. Anterior and middle cerebral arteries patent bilaterally without large vessel occlusion. Mild atherosclerotic irregularity in the anterior and middle cerebral arteries bilaterally without significant stenosis. Posterior circulation: Both vertebral arteries patent to the basilar. PICA patent bilaterally. Basilar widely patent. Superior cerebellar arteries patent bilaterally. Moderate stenosis P2 segment bilaterally. No large vessel occlusion Venous sinuses: Normal venous enhancement. Anatomic variants: None Review of the MIP images confirms the above findings IMPRESSION: 1. Negative for intracranial large vessel occlusion 2. Intracranial atherosclerotic disease with moderate stenosis in the P2 segment bilaterally. Mild atherosclerotic irregularity in the anterior and middle cerebral arteries. 3. No significant carotid or vertebral artery stenosis in the neck. Electronically Signed   By: CFranchot GalloM.D.   On: 02/16/2020 18:41      12-lead ECG SR All prior EKG's in EPIC reviewed with no documented atrial fibrillation  Telemetry SR, ST  Assessment and Plan:  1. Cryptogenic stroke The patient presents with cryptogenic stroke.   I spoke at length with the patient about monitoring for afib with either a 30 day event monitor or an implantable loop recorder.  Risks, benefits, and alteratives to implantable loop recorder were discussed with the patient today.   At this time, the patient is very clear in his decision to proceed with implantable loop recorder.   Wound care was reviewed with the patient (keep incision clean and dry for 3 days).  Wound check scheduled for the patient  Please call with questions.   RBaldwin Jamaica PA-C 02/19/2020  I have seen and examined this patient with RTommye Standard  Agree with above, note added to reflect  my findings.  On exam, RRR, no murmurs.  Patient presented to the hospital with cryptogenic stroke. To date, no cause has been found. [place] Glace plan for LINQ monitor to look for atrial fibrillation. Risks and benefits discussed. Risks include but not limited to bleeding and infection. The patient understands the risks and has agreed to the procedure.  Henny Strauch M. Jawara Latorre MD 02/19/2020 2:19 PM

## 2020-02-20 ENCOUNTER — Encounter (HOSPITAL_COMMUNITY): Payer: Self-pay | Admitting: Cardiology

## 2020-02-20 DIAGNOSIS — I6381 Other cerebral infarction due to occlusion or stenosis of small artery: Secondary | ICD-10-CM | POA: Diagnosis not present

## 2020-02-20 LAB — BASIC METABOLIC PANEL
Anion gap: 11 (ref 5–15)
BUN: 15 mg/dL (ref 8–23)
CO2: 23 mmol/L (ref 22–32)
Calcium: 8.9 mg/dL (ref 8.9–10.3)
Chloride: 102 mmol/L (ref 98–111)
Creatinine, Ser: 1.12 mg/dL (ref 0.61–1.24)
GFR, Estimated: >60 mL/min (ref >60)
Glucose, Bld: 122 mg/dL — ABNORMAL HIGH (ref 70–99)
Potassium: 3.5 mmol/L (ref 3.5–5.1)
Sodium: 136 mmol/L (ref 135–145)

## 2020-02-20 MED ORDER — SENNOSIDES-DOCUSATE SODIUM 8.6-50 MG PO TABS
1.0000 | ORAL_TABLET | Freq: Two times a day (BID) | ORAL | Status: DC
  Administered 2020-02-20 – 2020-02-21 (×2): 1 via ORAL
  Filled 2020-02-20 (×2): qty 1

## 2020-02-20 MED ORDER — LORAZEPAM 1 MG PO TABS
1.0000 mg | ORAL_TABLET | ORAL | Status: AC
  Administered 2020-02-20: 1 mg via ORAL
  Filled 2020-02-20: qty 1

## 2020-02-20 NOTE — Consult Note (Signed)
   [facility] Inpatient Consult   02/20/2020  Brent Dondero Sr. 14-Dec-1950 [id number]   [place] Organization [ACO] Patient: [name] plan  Patient is currently assigned for the [place] plan follow up for resource and disease management support with a [facility] Telephonic RN Care Management Coordinator. Our community based plan of care has focused on disease management and community resource support in the [place].    Plan: Patient will be followed telephonically by [place] Coordinator at transition of care for needs and support.   For additional questions or referrals please contact:   Natividad Brood, RN BSN [facility] Liaison  [phone] business mobile phone Toll free office [phone]  Fax number: [phone] [name]

## 2020-02-20 NOTE — Progress Notes (Addendum)
At [facility] tele called to say HR was sustaining in the 1teens to 120s.  Upon entering room, tech reported temp of 100.  VS rechecked and verified that these were accurate and that he was at a resting state.  He had been walking in the hallway with therapy 30 min prior and had been ok prior to that.  He did appear to be anxious and stated that he felt anxious.  Notified Dr Leonie Man of HR, temp, and anxiousness, gave him Tylenol for his temp, gave him an incentive spirometer and had him to use it to cough and deep breathe.  Dr Leonie Man did come up to evaluate and prior to coming up, had given a verbal order for a 1 time dose of ativan 1 mg po for his anxiety. I had also notified Dr Leonie Man prior to coming up that his site where is loop recorder was placed was saturated on the gauze but had not come through the tegaderm.  I asked if he wanted me to change the dressing because wife was concerned and wanted me to ask him.  He said he did not think so but it was not up to him so he had me to try to call Vick Frees from EP lab to see what she thought.  Dr came up and called Renee himself and explained and she wanted to talk to me to ask what the site looked like and see if it was dripping through the tegaderm and said if it is not leaking through, do not change it and they would look at it tomorrow.  I did tell his wife what was said, as she wanted it changed.  She said ok.

## 2020-02-20 NOTE — Progress Notes (Signed)
Physical Therapy Treatment Patient Details Name: Brent Barbaro Sr. MRN: [id number] DOB: [birth year] Today's Date: 02/20/2020    History of Present Illness The pt is a 70 yo male presenting with R-sided arm and leg weakness and R-sided facial droop. CT revealed no acute infarct, awaiting MRI, suspected L-sided lacunar infarct. tPA given 1810 1/30. PMH includes: HTN, HLD, DM, gout, and former smoker.    PT Comments    Pt tolerates treatment well with improved ambulation distances. Pt continues to demonstrate significant R weakness with RUE weaker than RLE. PT provides some assist to weight shift during ambulation to improve balance and to reduce falls risk. Pt will continue to benefit from aggressive mobilization and acute PT POC to improve gait quality and to reduce falls risk. PT continues to recommend CIR placement as the pt was independent prior to admission and demonstrates the potential to return to at least a supervision level of mobility with high intensity inpatient PT services.  Follow Up Recommendations  CIR     Equipment Recommendations  3in1 (PT);Wheelchair (measurements PT) (R platform walker)    Recommendations for Other Services       Precautions / Restrictions Precautions Precautions: Fall Precaution Comments: R-sided weakness Restrictions Weight Bearing Restrictions: No    Mobility  Bed Mobility Overal bed mobility: Needs Assistance Bed Mobility: Supine to Sit     Supine to sit: Min guard;HOB elevated     General bed mobility comments: use of railing  Transfers Overall transfer level: Needs assistance Equipment used: Right platform walker Transfers: Sit to/from Stand Sit to Stand: Min assist         General transfer comment: cues for hand placement  Ambulation/Gait Ambulation/Gait assistance: Min assist;Mod assist Gait Distance (Feet): 75 Feet (initial trial of 20') Assistive device: Right platform walker Gait Pattern/deviations: Step-to  pattern;Decreased stance time - right;Decreased weight shift to left;Narrow base of support Gait velocity: reduced Gait velocity interpretation: <1.31 ft/sec, indicative of household ambulator General Gait Details: pt with impaired control in medial/lateral placement of R foot, often drifting toward L side with narrowed BOS. PT provides cues to widen BOS to improve stability. Pt also tending to drift to right due to impaired RUE strength.   Stairs             Wheelchair Mobility    Modified Rankin (Stroke Patients Only) Modified Rankin (Stroke Patients Only) Pre-Morbid Rankin Score: No symptoms Modified Rankin: Moderately severe disability     Balance Overall balance assessment: Needs assistance Sitting-balance support: No upper extremity supported;Feet supported Sitting balance-Leahy Scale: Fair     Standing balance support: Bilateral upper extremity supported Standing balance-Leahy Scale: Poor Standing balance comment: minG-minA with BUE support of RW                            Cognition Arousal/Alertness: Awake/alert Behavior During Therapy: WFL for tasks assessed/performed Overall Cognitive Status: Impaired/Different from baseline Area of Impairment: Awareness                           Awareness: Emergent          Exercises      General Comments General comments (skin integrity, edema, etc.): VSS on RA      Pertinent Vitals/Pain Pain Assessment: Faces Faces Pain Scale: Hurts little more Pain Location: R wrist Pain Descriptors / Indicators: Sore Pain Intervention(s): Ice applied    Home Living  Prior Function            PT Goals (current goals can now be found in the care plan section) Acute Rehab PT Goals Patient Stated Goal: get to rehab and return home independent\ Progress towards PT goals: Progressing toward goals    Frequency    Min 4X/week      PT Plan Current plan remains  appropriate    Co-evaluation              AM-PAC PT "6 Clicks" Mobility   Outcome Measure  Help needed turning from your back to your side while in a flat bed without using bedrails?: A Little Help needed moving from lying on your back to sitting on the side of a flat bed without using bedrails?: A Little Help needed moving to and from a bed to a chair (including a wheelchair)?: A Little Help needed standing up from a chair using your arms (e.g., wheelchair or bedside chair)?: A Little Help needed to walk in hospital room?: A Lot Help needed climbing 3-5 steps with a railing? : Total 6 Click Score: 15    End of Session Equipment Utilized During Treatment: Gait belt Activity Tolerance: Patient tolerated treatment well Patient left: in chair;with call bell/phone within reach;with chair alarm set;with family/visitor present Nurse Communication: Mobility status PT Visit Diagnosis: Hemiplegia and hemiparesis;Difficulty in walking, not elsewhere classified (R26.2) Hemiplegia - Right/Left: Right Hemiplegia - dominant/non-dominant: Dominant Hemiplegia - caused by: Cerebral infarction     Time: 1459-1540 PT Time Calculation (min) (ACUTE ONLY): 41 min  Charges:  $Gait Training: 23-37 mins $Therapeutic Activity: 8-22 mins                     Zenaida Niece, PT, DPT Acute Rehabilitation Pager: [phone]    Zenaida Niece 02/20/2020, 4:31 PM

## 2020-02-20 NOTE — Progress Notes (Signed)
Paged Dr Bartholome Bill on [facility] for the stool softener and a sling for his right arm.

## 2020-02-20 NOTE — Progress Notes (Signed)
Pt  Requested a scheduled stool softener because had difficulty evacuating his stool this evening.   Also, he requested a sling for his swollen weak right arm.  He said it feels so much better when it is up.  He has denied pain to this nurse but his wife said he does have pain.  After talking to him about it, he did admit to some pain but declined tylenol or pain meds.  He did accept alternating ice packs and heat packs and he said that did help.  I did page Dr Marlowe Sax in [facility] for his requests.

## 2020-02-20 NOTE — Plan of Care (Signed)
  Problem: Education: Goal: Knowledge of disease or condition will improve Outcome: Progressing Goal: Knowledge of secondary prevention will improve Outcome: Progressing Goal: Knowledge of patient specific risk factors addressed and post discharge goals established will improve Outcome: Progressing   Problem: Coping: Goal: Will verbalize positive feelings about self Outcome: Progressing Goal: Will identify appropriate support needs Outcome: Progressing   Problem: Health Behavior/Discharge Planning: Goal: Ability to manage health-related needs will improve Outcome: Progressing   Problem: Self-Care: Goal: Ability to participate in self-care as condition permits will improve Outcome: Progressing Goal: Verbalization of feelings and concerns over difficulty with self-care will improve Outcome: Progressing   Problem: Ischemic Stroke/TIA Tissue Perfusion: Goal: Complications of ischemic stroke/TIA will be minimized Outcome: Progressing   Problem: Education: Goal: Knowledge of General Education information will improve Description: Including pain rating scale, medication(s)/side effects and non-pharmacologic comfort measures Outcome: Progressing   Problem: Health Behavior/Discharge Planning: Goal: Ability to manage health-related needs will improve Outcome: Progressing   Problem: Clinical Measurements: Goal: Ability to maintain clinical measurements within normal limits will improve Outcome: Progressing Goal: Will remain free from infection Outcome: Progressing Goal: Diagnostic test results will improve Outcome: Progressing Goal: Respiratory complications will improve Outcome: Progressing Goal: Cardiovascular complication will be avoided Outcome: Progressing   Problem: Activity: Goal: Risk for activity intolerance will decrease Outcome: Progressing   Problem: Nutrition: Goal: Adequate nutrition will be maintained Outcome: Progressing   Problem: Coping: Goal: Level of  anxiety will decrease Outcome: Progressing   Problem: Elimination: Goal: Will not experience complications related to bowel motility Outcome: Progressing Goal: Will not experience complications related to urinary retention Outcome: Progressing   Problem: Pain Managment: Goal: General experience of comfort will improve Outcome: Progressing   Problem: Safety: Goal: Ability to remain free from injury will improve Outcome: Progressing   Problem: Skin Integrity: Goal: Risk for impaired skin integrity will decrease Outcome: Progressing

## 2020-02-20 NOTE — Plan of Care (Signed)
Problem: Education: Goal: Knowledge of disease or condition will improve 02/20/2020 1008 by Asencion Partridge, RN Outcome: Progressing 02/20/2020 1008 by Asencion Partridge, RN Outcome: Progressing Goal: Knowledge of secondary prevention will improve 02/20/2020 1008 by Asencion Partridge, RN Outcome: Progressing 02/20/2020 1008 by Asencion Partridge, RN Outcome: Progressing Goal: Knowledge of patient specific risk factors addressed and post discharge goals established will improve 02/20/2020 1008 by Asencion Partridge, RN Outcome: Progressing 02/20/2020 1008 by Asencion Partridge, RN Outcome: Progressing   Problem: Coping: Goal: Will verbalize positive feelings about self 02/20/2020 1008 by Asencion Partridge, RN Outcome: Progressing 02/20/2020 1008 by Asencion Partridge, RN Outcome: Progressing Goal: Will identify appropriate support needs 02/20/2020 1008 by Asencion Partridge, RN Outcome: Progressing 02/20/2020 1008 by Asencion Partridge, RN Outcome: Progressing   Problem: Health Behavior/Discharge Planning: Goal: Ability to manage health-related needs will improve 02/20/2020 1008 by Asencion Partridge, RN Outcome: Progressing 02/20/2020 1008 by Asencion Partridge, RN Outcome: Progressing   Problem: Self-Care: Goal: Ability to participate in self-care as condition permits will improve 02/20/2020 1008 by Asencion Partridge, RN Outcome: Progressing 02/20/2020 1008 by Asencion Partridge, RN Outcome: Progressing Goal: Verbalization of feelings and concerns over difficulty with self-care will improve 02/20/2020 1008 by Asencion Partridge, RN Outcome: Progressing 02/20/2020 1008 by Asencion Partridge, RN Outcome: Progressing   Problem: Ischemic Stroke/TIA Tissue Perfusion: Goal: Complications of ischemic stroke/TIA will be minimized 02/20/2020 1008 by Asencion Partridge, RN Outcome: Progressing 02/20/2020 1008 by Asencion Partridge, RN Outcome: Progressing   Problem: Education: Goal: Knowledge of General Education information will  improve Description: Including pain rating scale, medication(s)/side effects and non-pharmacologic comfort measures 02/20/2020 1008 by Asencion Partridge, RN Outcome: Progressing 02/20/2020 1008 by Asencion Partridge, RN Outcome: Progressing   Problem: Health Behavior/Discharge Planning: Goal: Ability to manage health-related needs will improve 02/20/2020 1008 by Asencion Partridge, RN Outcome: Progressing 02/20/2020 1008 by Asencion Partridge, RN Outcome: Progressing   Problem: Clinical Measurements: Goal: Ability to maintain clinical measurements within normal limits will improve 02/20/2020 1008 by Asencion Partridge, RN Outcome: Progressing 02/20/2020 1008 by Asencion Partridge, RN Outcome: Progressing Goal: Will remain free from infection 02/20/2020 1008 by Asencion Partridge, RN Outcome: Progressing 02/20/2020 1008 by Asencion Partridge, RN Outcome: Progressing Goal: Diagnostic test results will improve 02/20/2020 1008 by Asencion Partridge, RN Outcome: Progressing 02/20/2020 1008 by Asencion Partridge, RN Outcome: Progressing Goal: Respiratory complications will improve 02/20/2020 1008 by Asencion Partridge, RN Outcome: Progressing 02/20/2020 1008 by Asencion Partridge, RN Outcome: Progressing Goal: Cardiovascular complication will be avoided 02/20/2020 1008 by Asencion Partridge, RN Outcome: Progressing 02/20/2020 1008 by Asencion Partridge, RN Outcome: Progressing   Problem: Activity: Goal: Risk for activity intolerance will decrease 02/20/2020 1008 by Asencion Partridge, RN Outcome: Progressing 02/20/2020 1008 by Asencion Partridge, RN Outcome: Progressing   Problem: Nutrition: Goal: Adequate nutrition will be maintained 02/20/2020 1008 by Asencion Partridge, RN Outcome: Progressing 02/20/2020 1008 by Asencion Partridge, RN Outcome: Progressing   Problem: Coping: Goal: Level of anxiety will decrease 02/20/2020 1008 by Asencion Partridge, RN Outcome: Progressing 02/20/2020 1008 by Asencion Partridge, RN Outcome: Progressing    Problem: Elimination: Goal: Will not experience complications related to bowel motility 02/20/2020 1008 by Asencion Partridge, RN Outcome: Progressing 02/20/2020 1008 by Asencion Partridge, RN Outcome: Progressing Goal: Will not experience complications related to urinary retention 02/20/2020 1008 by Josefa Half,  Kinnie Feil, RN Outcome: Progressing 02/20/2020 1008 by Asencion Partridge, RN Outcome: Progressing   Problem: Pain Managment: Goal: General experience of comfort will improve 02/20/2020 1008 by Asencion Partridge, RN Outcome: Progressing 02/20/2020 1008 by Asencion Partridge, RN Outcome: Progressing   Problem: Safety: Goal: Ability to remain free from injury will improve 02/20/2020 1008 by Asencion Partridge, RN Outcome: Progressing 02/20/2020 1008 by Asencion Partridge, RN Outcome: Progressing   Problem: Skin Integrity: Goal: Risk for impaired skin integrity will decrease 02/20/2020 1008 by Asencion Partridge, RN Outcome: Progressing 02/20/2020 1008 by Asencion Partridge, RN Outcome: Progressing

## 2020-02-20 NOTE — Progress Notes (Signed)
Did find out that pt is not followed by [facility].  Will notify neurology

## 2020-02-20 NOTE — Progress Notes (Signed)
STROKE TEAM PROGRESS NOTE   INTERVAL HISTORY No acute overnight events. Patient is sitting up in bed . Neurological exam unchanged he denies new concerns today. He is hemodynamically and neurologically stable. He is awaiting CIR placement.   Vitals:   02/19/20 2332 02/20/20 0439 02/20/20 0747 02/20/20 1124  BP: (!) 142/91 (!) 142/81 (!) 143/82 (!) 150/93  Pulse: 93 86 91 (!) 108  Resp: 17 18 18 18   Temp: 98 F (36.7 C) 99.1 F (37.3 C) 98.9 F (37.2 C) 97.8 F (36.6 C)  TempSrc: Oral Oral Oral Oral  SpO2: 98% 97% 99% 97%  Weight:       CBC:  Recent Labs  Lab 02/16/20 1754 02/16/20 1800 02/18/20 1116  WBC 10.8*  --  10.9*  NEUTROABS 7.2  --  8.4*  HGB 16.0 16.3 15.2  HCT 48.9 48.0 43.2  MCV 90.7  --  89.6  PLT 265  --  696   Basic Metabolic Panel:  Recent Labs  Lab 02/19/20 0247 02/20/20 0357  NA 138 136  K 3.8 3.5  CL 106 102  CO2 22 23  GLUCOSE 100* 122*  BUN 15 15  CREATININE 1.01 1.12  CALCIUM 9.4 8.9   Lipid Panel:  Recent Labs  Lab 02/17/20 0304  CHOL 174  TRIG 64  HDL 40*  CHOLHDL 4.4  VLDL 13  LDLCALC 121*   HgbA1c:  Recent Labs  Lab 02/17/20 0304  HGBA1C 5.5   Urine Drug Screen: None detected  IMAGING past 24 hours  CT Head code stroke 02/16/2019  IMPRESSION: 1. No acute abnormality identified 2. ASPECTS is 10 3. Chronic infarct in the right corona radiata.  CT Angio Head and Neck  02/16/2020  IMPRESSION: 1. Negative for intracranial large vessel occlusion 2. Intracranial atherosclerotic disease with moderate stenosis in the P2 segment bilaterally. Mild atherosclerotic irregularity in the anterior and middle cerebral arteries. 3. No significant carotid or vertebral artery stenosis in the neck.  MRI Brain 02/17/2020  IMPRESSION: 1. Acute infarction in the left basal ganglia and radiating white matter tracts. No mass effect or hemorrhage. 2. Mild chronic small-vessel ischemic changes elsewhere affecting the cerebral  hemispheric white matter and right basal ganglia.  Echocardiogram 02/17/2020   PHYSICAL EXAM Constitutional: Appears well-developed and well-nourished.  Psych: Affect appropriate to situation Eyes: No scleral injection HENT: No OP obstruction, good dentition MSK: no joint deformities.  Cardiovascular: Normal rate and regular rhythm.  Respiratory: Effort normal, non-labored breathing GI: Soft.  No distension. There is no tenderness.  Skin: WDI  Mental Status: Alert, oriented, thought content appropriate.  Speech fluent without evidence of aphasia.  Able to follow 3 step commands without difficulty. Cranial Nerves: II:  Visual fields grossly normal, pupils equal, round, reactive to light and accommodation III,IV, VI: ptosis not present, extra-ocular motions intact bilaterally V,VII: smile asymmetric, right facial droop, facial light touch sensation normal bilaterally VIII: hearing normal bilaterally IX,X: uvula rises symmetrically XI: bilateral shoulder shrug XII: midline tongue extension without atrophy or fasciculations  Motor: Right : Upper extremity   2+/5    Left:     Upper extremity   5/5  Lower extremity   3+/5     Lower extremity   5/5 Tone and bulk:normal tone throughout; no atrophy noted Sensory: Light touch intact throughout, bilaterally  Plantars: Right: downgoing   Left: downgoing Cerebellar: normal finger-to-nose Gait: Deferred  ASSESSMENT/PLAN [name] is a 70 y.o. male with PMHx of hypertension, hyperlipidemia, diabetes, gout, former  smoker quit in 1968 presented with right arm and right leg weakness and right facial droop.  Patient was noncompliant with his statins but claims was taking aspirin daily.  He received TPA at 6:13 p.m. on 02/16/2020.  CT head does not show any acute abnormality.  CTA does not show any intracranial large vessel occlusion.  MRI post TPA shows acute infarction in left basal ganglia and radiating white matter  tracts.   Stroke: Acute ischemic infarct in left basal ganglia, likely due cryptogenic given large size of the infarct code Stroke CT head No acute abnormality.ASPECTS 10.   CTA head & neck :  Negative for intracranial large vessel occlusion  MRI : Acute infarction in the left basal ganglia and radiating white matter tracts.  2D Echo : 60-65% There is severe left ventricular hypertrophy of the basal-septal segment. Left ventricular diastolic parameters are consistent with Grade I diastolic dysfunction (impaired relaxation).  LDL 121  HgbA1c 5.5  VTE prophylaxis -SCDs    Diet   Diet Heart Room service appropriate? Yes with Assist; Fluid consistency: Thin    aspirin 81 mg daily prior to admission, For now due to post tPA. Recommend aspirin 81 mg and Plavix 75 mg for 3 weeks and then continue Plavix alone.  Possible Loop placement is pending  Therapy recommendations: [facility]  Disposition: Awaiting insurance approval and bed availability at [facility]  Hypertension  Home meds: Losartan 100 mg  BP improving, [id number] systolic and 65-465 diastolic past 24 hours  . Long-term BP goal normotensive  Hyperlipidemia  Home meds:  Crestor 5 mg  LDL 121, goal < 70  Crestor 20 mg daily  Continue statin at discharge  Diabetes type II Controlled  Home meds: None  HgbA1c 5.5, goal < 7.0  CBGs  No results for input(s): GLUCAP in the last 72 hours.   SSI   Other Stroke Risk Factors  Advanced Age >/= 18   Former smoker, quit in 1968  ETOH use and will be advised to stop drinking alcohol.  Possible CHF,Grade I diastolic dysfunction on Echo  Other Active Problems  Gout : Colchicine 0.6 mg tablet daily.  Hospital day # 4 . Continue present treatment and ongoing therapies and await inpatient rehab bed and patient is medically stable for transfer when bed available hopefully in the next few days. Brent Contras, MD   To contact Stroke Continuity provider, please refer to  [email or web address] After hours, contact General Neurology

## 2020-02-20 NOTE — Progress Notes (Signed)
Orthopedic Tech Progress Note Patient Details:  Brent Strupp Sr. 10/13/1950 [id number]  Ortho Devices Type of Ortho Device: Arm sling Ortho Device/Splint Location: RUE Ortho Device/Splint Interventions: Application   Post Interventions Patient Tolerated: Well Instructions Provided: Adjustment of device   Brent Mccormick E Brent Mccormick 02/20/2020, 10:03 PM

## 2020-02-21 ENCOUNTER — Encounter (HOSPITAL_COMMUNITY): Payer: Self-pay | Admitting: Physical Medicine and Rehabilitation

## 2020-02-21 ENCOUNTER — Inpatient Hospital Stay (HOSPITAL_COMMUNITY)
Admission: RE | Admit: 2020-02-21 | Discharge: 2020-03-04 | DRG: 057 | Disposition: A | Discharge status: Home / Self Care | Payer: 59 | Source: Intra-hospital | Attending: Physical Medicine and Rehabilitation | Admitting: Physical Medicine and Rehabilitation

## 2020-02-21 ENCOUNTER — Other Ambulatory Visit: Payer: Self-pay

## 2020-02-21 DIAGNOSIS — I69351 Hemiplegia and hemiparesis following cerebral infarction affecting right dominant side: Secondary | ICD-10-CM | POA: Diagnosis not present

## 2020-02-21 DIAGNOSIS — Z7982 Long term (current) use of aspirin: Secondary | ICD-10-CM

## 2020-02-21 DIAGNOSIS — Z8371 Family history of colonic polyps: Secondary | ICD-10-CM

## 2020-02-21 DIAGNOSIS — I69398 Other sequelae of cerebral infarction: Secondary | ICD-10-CM | POA: Diagnosis not present

## 2020-02-21 DIAGNOSIS — R2689 Other abnormalities of gait and mobility: Secondary | ICD-10-CM | POA: Diagnosis present

## 2020-02-21 DIAGNOSIS — R000 Tachycardia, unspecified: Secondary | ICD-10-CM | POA: Diagnosis not present

## 2020-02-21 DIAGNOSIS — I69392 Facial weakness following cerebral infarction: Secondary | ICD-10-CM

## 2020-02-21 DIAGNOSIS — Z8249 Family history of ischemic heart disease and other diseases of the circulatory system: Secondary | ICD-10-CM | POA: Diagnosis not present

## 2020-02-21 DIAGNOSIS — Z87891 Personal history of nicotine dependence: Secondary | ICD-10-CM

## 2020-02-21 DIAGNOSIS — Z8041 Family history of malignant neoplasm of ovary: Secondary | ICD-10-CM | POA: Diagnosis not present

## 2020-02-21 DIAGNOSIS — E119 Type 2 diabetes mellitus without complications: Secondary | ICD-10-CM | POA: Diagnosis present

## 2020-02-21 DIAGNOSIS — E785 Hyperlipidemia, unspecified: Secondary | ICD-10-CM | POA: Diagnosis present

## 2020-02-21 DIAGNOSIS — I10 Essential (primary) hypertension: Secondary | ICD-10-CM | POA: Diagnosis not present

## 2020-02-21 DIAGNOSIS — R197 Diarrhea, unspecified: Secondary | ICD-10-CM | POA: Diagnosis not present

## 2020-02-21 DIAGNOSIS — G479 Sleep disorder, unspecified: Secondary | ICD-10-CM | POA: Diagnosis not present

## 2020-02-21 DIAGNOSIS — I6381 Other cerebral infarction due to occlusion or stenosis of small artery: Secondary | ICD-10-CM | POA: Diagnosis not present

## 2020-02-21 DIAGNOSIS — Z8739 Personal history of other diseases of the musculoskeletal system and connective tissue: Secondary | ICD-10-CM

## 2020-02-21 DIAGNOSIS — G8191 Hemiplegia, unspecified affecting right dominant side: Secondary | ICD-10-CM | POA: Diagnosis not present

## 2020-02-21 DIAGNOSIS — D72829 Elevated white blood cell count, unspecified: Secondary | ICD-10-CM

## 2020-02-21 DIAGNOSIS — R799 Abnormal finding of blood chemistry, unspecified: Secondary | ICD-10-CM | POA: Diagnosis not present

## 2020-02-21 DIAGNOSIS — M109 Gout, unspecified: Secondary | ICD-10-CM | POA: Diagnosis not present

## 2020-02-21 DIAGNOSIS — I639 Cerebral infarction, unspecified: Secondary | ICD-10-CM

## 2020-02-21 DIAGNOSIS — Z79899 Other long term (current) drug therapy: Secondary | ICD-10-CM | POA: Diagnosis not present

## 2020-02-21 DIAGNOSIS — I69318 Other symptoms and signs involving cognitive functions following cerebral infarction: Secondary | ICD-10-CM | POA: Diagnosis not present

## 2020-02-21 DIAGNOSIS — M10072 Idiopathic gout, left ankle and foot: Secondary | ICD-10-CM | POA: Diagnosis not present

## 2020-02-21 LAB — CBC
HCT: 43.4 % (ref 39.0–52.0)
Hemoglobin: 14.1 g/dL (ref 13.0–17.0)
MCH: 29.7 pg (ref 26.0–34.0)
MCHC: 32.5 g/dL (ref 30.0–36.0)
MCV: 91.4 fL (ref 80.0–100.0)
Platelets: 239 10*3/uL (ref 150–400)
RBC: 4.75 MIL/uL (ref 4.22–5.81)
RDW: 12.7 % (ref 11.5–15.5)
WBC: 11.1 10*3/uL — ABNORMAL HIGH (ref 4.0–10.5)
nRBC: 0 % (ref 0.0–0.2)

## 2020-02-21 LAB — BASIC METABOLIC PANEL
Anion gap: 14 (ref 5–15)
BUN: 24 mg/dL — ABNORMAL HIGH (ref 8–23)
CO2: 20 mmol/L — ABNORMAL LOW (ref 22–32)
Calcium: 8.9 mg/dL (ref 8.9–10.3)
Chloride: 104 mmol/L (ref 98–111)
Creatinine, Ser: 1.11 mg/dL (ref 0.61–1.24)
GFR, Estimated: >60 mL/min (ref >60)
Glucose, Bld: 117 mg/dL — ABNORMAL HIGH (ref 70–99)
Potassium: 3.8 mmol/L (ref 3.5–5.1)
Sodium: 138 mmol/L (ref 135–145)

## 2020-02-21 MED ORDER — VITAMIN D 25 MCG (1000 UNIT) PO TABS
1000.0000 [IU] | ORAL_TABLET | Freq: Every day | ORAL | Status: DC
  Administered 2020-02-22 – 2020-03-04 (×12): 1000 [IU] via ORAL
  Filled 2020-02-21 (×12): qty 1

## 2020-02-21 MED ORDER — ASPIRIN EC 81 MG PO TBEC
81.0000 mg | DELAYED_RELEASE_TABLET | Freq: Every day | ORAL | Status: DC
  Administered 2020-02-22 – 2020-03-04 (×12): 81 mg via ORAL
  Filled 2020-02-21 (×12): qty 1

## 2020-02-21 MED ORDER — ACETAMINOPHEN 325 MG PO TABS: 650.0000 mg | ORAL_TABLET | ORAL | Status: DC | PRN

## 2020-02-21 MED ORDER — ENOXAPARIN SODIUM 40 MG/0.4ML ~~LOC~~ SOLN: 40.0000 mg | SUBCUTANEOUS | Status: DC

## 2020-02-21 MED ORDER — ROSUVASTATIN CALCIUM 20 MG PO TABS
20.0000 mg | ORAL_TABLET | Freq: Every day | ORAL | Status: DC
  Administered 2020-02-22 – 2020-03-04 (×12): 20 mg via ORAL
  Filled 2020-02-21 (×12): qty 1

## 2020-02-21 MED ORDER — ENOXAPARIN SODIUM 40 MG/0.4ML ~~LOC~~ SOLN
40.0000 mg | Freq: Every day | SUBCUTANEOUS | Status: DC
  Administered 2020-02-22 – 2020-03-03 (×11): 40 mg via SUBCUTANEOUS
  Filled 2020-02-21 (×12): qty 0.4

## 2020-02-21 MED ORDER — ENOXAPARIN SODIUM 40 MG/0.4ML ~~LOC~~ SOLN: 40.0000 mg | Freq: Every day | SUBCUTANEOUS | Status: DC

## 2020-02-21 MED ORDER — PANTOPRAZOLE SODIUM 40 MG PO TBEC
40.0000 mg | DELAYED_RELEASE_TABLET | Freq: Every day | ORAL | Status: DC
  Administered 2020-02-21 – 2020-03-03 (×12): 40 mg via ORAL
  Filled 2020-02-21 (×12): qty 1

## 2020-02-21 MED ORDER — ROSUVASTATIN CALCIUM 20 MG PO TABS: 20.0000 mg | ORAL_TABLET | Freq: Every day | ORAL | Status: DC

## 2020-02-21 MED ORDER — CLOPIDOGREL BISULFATE 75 MG PO TABS
75.0000 mg | ORAL_TABLET | Freq: Every day | ORAL | Status: DC
  Administered 2020-02-22 – 2020-03-04 (×12): 75 mg via ORAL
  Filled 2020-02-21 (×12): qty 1

## 2020-02-21 MED ORDER — SENNOSIDES-DOCUSATE SODIUM 8.6-50 MG PO TABS
1.0000 | ORAL_TABLET | Freq: Two times a day (BID) | ORAL | Status: DC
  Administered 2020-02-21 – 2020-03-02 (×14): 1 via ORAL
  Filled 2020-02-21 (×22): qty 1

## 2020-02-21 MED ORDER — COLCHICINE 0.6 MG PO TABS
0.6000 mg | ORAL_TABLET | ORAL | Status: DC
  Administered 2020-02-23 – 2020-02-25 (×2): 0.6 mg via ORAL
  Filled 2020-02-21 (×2): qty 1

## 2020-02-21 MED ORDER — CLOPIDOGREL BISULFATE 75 MG PO TABS: 75.0000 mg | ORAL_TABLET | Freq: Every day | ORAL | Status: DC

## 2020-02-21 MED ORDER — ACETAMINOPHEN 325 MG PO TABS
650.0000 mg | ORAL_TABLET | ORAL | Status: DC | PRN
  Administered 2020-02-22 – 2020-02-25 (×5): 650 mg via ORAL
  Filled 2020-02-21 (×7): qty 2

## 2020-02-21 MED ORDER — LORAZEPAM 1 MG PO TABS
1.0000 mg | ORAL_TABLET | ORAL | Status: AC
  Administered 2020-02-21: 1 mg via ORAL
  Filled 2020-02-21: qty 1

## 2020-02-21 MED ORDER — ASPIRIN 81 MG PO TBEC: 81.0000 mg | DELAYED_RELEASE_TABLET | Freq: Every day | ORAL | 11 refills | Status: DC

## 2020-02-21 MED ORDER — LOSARTAN POTASSIUM 50 MG PO TABS
100.0000 mg | ORAL_TABLET | Freq: Every day | ORAL | Status: DC
  Administered 2020-02-22 – 2020-03-04 (×12): 100 mg via ORAL
  Filled 2020-02-21 (×12): qty 2

## 2020-02-21 MED ORDER — SENNOSIDES-DOCUSATE SODIUM 8.6-50 MG PO TABS: 1.0000 | ORAL_TABLET | Freq: Two times a day (BID) | ORAL | Status: DC

## 2020-02-21 MED ORDER — ACETAMINOPHEN 160 MG/5ML PO SOLN: 650.0000 mg | ORAL | Status: DC | PRN

## 2020-02-21 MED ORDER — ACETAMINOPHEN 650 MG RE SUPP: 650.0000 mg | RECTAL | Status: DC | PRN

## 2020-02-21 NOTE — Progress Notes (Signed)
Was notified that HR was elevated and MEWS turned yellow. Pt not in distress. VSS rechecked, MD and charge RN notified, MEWS protocol initiated. Ativan ordered. Wife at bedside and aware.

## 2020-02-21 NOTE — PMR Pre-admission (Addendum)
PMR Admission Coordinator Pre-Admission Assessment  Patient: Brent Mccormick. is an 70 y.o., male MRN: [id number] DOB: 1950-08-29 Height:   Weight: 78.4 kg              Insurance Information HMO:     PPO: yes     PCP:      IPA:      80/20: medical   OTHER:  PRIMARYIval Mccormick      Policy#: [id number]      Subscriber: Brent Mccormick (spouse) CM Name: Caren Griffins      Phone#: [phone] ext [phone]     Fax#: [phone] Pre-Cert#: [id number] auth for CIR via fax from [place] with updates due to fax listed above on 02/28/2020.       Employer:  Benefits:  Phone #:      Name:  Eff. Date: 01/18/2020     Deduct: $300      Out of Pocket Max: $7900 ($0 met)      Life Max: n/a  CIR: $300/admission, then 80% coverage      SNF: 80% Outpatient: 80%     Co-Ins: 20% Home Health: 80%      Co-Ins: 20% DME: 80%     Co-Pay: 20% Providers: preferred network   SECONDARY: Medicare A      Policy#: [id number]      Phone#:   Development worker, community:       Phone#:   The Engineer, petroleum" for patients in Inpatient Rehabilitation Facilities with attached "Privacy Act [place] Records" was provided and verbally reviewed with: Patient and Family  Emergency Contact Information Contact Information    Name Relation Home Work Mobile   Turrell Spouse [phone]  [phone]     Current Medical History  Patient Admitting Diagnosis: CVA   History of Present Illness: Brent Mccormick, Brent Mccormick. is a 70 year old right-handed male with history of gout, type 2 diabetes mellitus, hypertension, hyperlipidemia, quit smoking 54 years ago.  Presented 02/16/2020 with right side weakness and facial droop.  Cranial CT scan negative for acute changes.  Chronic infarct right corona radiata.  CT angiogram of head and neck negative for large vessel occlusion.  Patient did receive TPA.  MRI showed acute infarction left basal ganglia and radiating white matter tracts.  No mass-effect or hemorrhage.  Echocardiogram  with ejection fraction of 60 to 65% no wall motion abnormalities grade 1 diastolic dysfunction.  Admission chemistries unremarkable except BUN 24 creatinine 1.41 WBC 10,800.  Neurology consulted maintain on aspirin 81 mg daily and Plavix 75 mg daily x3 weeks then Plavix alone.  Patient did undergo a loop recorder placement per cardiology services 02/19/2020.  Lovenox for DVT prophylaxis.  Therapy evaluations completed due to patient's right side weakness he was recommended for a comprehensive rehab program.  Complete NIHSS TOTAL: 3 Glasgow Coma Scale Score: 15  Past Medical History  Past Medical History:  Diagnosis Date  . Carpal tunnel syndrome   . Gout   . Hyperlipidemia    on meds  . Hypertension    on meds    Family History  family history includes Colon polyps (age of onset: 65) in his sister; Heart disease in his father; Hypertension in his father; Ovarian cancer in his mother.  Prior Rehab/Hospitalizations:  Has the patient had prior rehab or hospitalizations prior to admission? No  Has the patient had major surgery during 100 days prior to admission? Yes  Current Medications   Current Facility-Administered Medications:  .  acetaminophen (TYLENOL) tablet 650  mg, 650 mg, Oral, Q4H PRN, 650 mg at 02/21/20 1014 **OR** acetaminophen (TYLENOL) 160 MG/5ML solution 650 mg, 650 mg, Per Tube, Q4H PRN **OR** acetaminophen (TYLENOL) suppository 650 mg, 650 mg, Rectal, Q4H PRN, Bailey-Modzik, Delila A, NP .  aspirin EC tablet 81 mg, 81 mg, Oral, Daily, Bailey-Modzik, Delila A, NP, 81 mg at 02/21/20 1001 .  Chlorhexidine Gluconate Cloth 2 % PADS 6 each, 6 each, Topical, Daily, Bailey-Modzik, Delila A, NP, 6 each at 02/21/20 1002 .  cholecalciferol (VITAMIN D3) tablet 1,000 Units, 1,000 Units, Oral, Daily, Bailey-Modzik, Delila A, NP, 1,000 Units at 02/21/20 1002 .  clopidogrel (PLAVIX) tablet 75 mg, 75 mg, Oral, Daily, Bailey-Modzik, Delila A, NP, 75 mg at 02/21/20 1002 .  colchicine tablet  0.6 mg, 0.6 mg, Oral, QODAY, Bailey-Modzik, Delila A, NP, 0.6 mg at 02/21/20 1001 .  enoxaparin (LOVENOX) injection 40 mg, 40 mg, Subcutaneous, Daily, Garvin Fila, MD, 40 mg at 02/21/20 1002 .  influenza vaccine adjuvanted (FLUAD) injection 0.5 mL, 0.5 mL, Intramuscular, Tomorrow-1000, Bhagat, Srishti L, MD .  labetalol (NORMODYNE) injection 10 mg, 10 mg, Intravenous, Q10 min PRN, Bailey-Modzik, Delila A, NP, 10 mg at 02/19/20 1400 .  losartan (COZAAR) tablet 100 mg, 100 mg, Oral, Daily, Antony Contras S, MD, 100 mg at 02/21/20 1001 .  pantoprazole (PROTONIX) EC tablet 40 mg, 40 mg, Oral, QHS, Bhagat, Srishti L, MD, 40 mg at 02/20/20 2236 .  rosuvastatin (CRESTOR) tablet 20 mg, 20 mg, Oral, Daily, Bailey-Modzik, Delila A, NP, 20 mg at 02/21/20 1001 .  senna-docusate (Senokot-S) tablet 1 tablet, 1 tablet, Oral, BID, Donnetta Simpers, MD, 1 tablet at 02/21/20 1002 .  sodium chloride flush (NS) 0.9 % injection 3 mL, 3 mL, Intravenous, Once, Bailey-Modzik, Delila A, NP  Patients Current Diet:  Diet Order            Diet Heart Room service appropriate? Yes with Assist; Fluid consistency: Thin  Diet effective now                 Precautions / Restrictions Precautions Precautions: Fall Precaution Comments: R-sided weakness Restrictions Weight Bearing Restrictions: No   Has the patient had 2 or more falls or a fall with injury in the past year?No  Prior Activity Level Community (5-7x/wk): was working full time Fish farm manager, driving, no DME at baseline  Prior Functional Level Prior Function Level of Independence: Independent Comments: works from Chemical engineer. enjoys reading historical non-fiction. Wife is RN at [employer], Mccormick be with pt 24/7 tues-fri and other family Mccormick be available to cover other days  Self Care: Did the patient need help bathing, dressing, using the toilet or eating?  Independent  Indoor Mobility: Did the patient need assistance with walking  from room to room (with or without device)? Independent  Stairs: Did the patient need assistance with internal or external stairs (with or without device)? Independent  Functional Cognition: Did the patient need help planning regular tasks such as shopping or remembering to take medications? Independent  Home Assistive Devices / Equipment Home Equipment: Shower seat - built in,Grab bars - tub/shower,Crutches,Hand held shower head  Prior Device Use: Indicate devices/aids used by the patient prior to current illness, exacerbation or injury? None of the above  Current Functional Level Cognition  Arousal/Alertness: Awake/alert Overall Cognitive Status: Within Functional Limits for tasks assessed Orientation Level: Oriented X4 General Comments: pt with generally functional cog through session, followed all commands or expressed inability. benefits from encouragement of use of RUE  Attention: Sustained Sustained Attention: Appears intact Memory: Impaired Memory Impairment: Retrieval deficit Awareness: Appears intact Problem Solving: Appears intact Safety/Judgment: Appears intact    Extremity Assessment (includes Sensation/Coordination)  Upper Extremity Assessment: RUE deficits/detail RUE Deficits / Details: able to complete digit flexion but lacks digits extension, adduction of thumb present, decreased bicep but tricep 3 out 5 against gravity. able to shrug shoulders in scapula elevation RUE Sensation: decreased light touch RUE Coordination: decreased fine motor,decreased gross motor  Lower Extremity Assessment: Defer to PT evaluation RLE Deficits / Details: pt reports intact sensation. 3+/5 at ankle DF, 3/5 knee extension, 3/5 after multiple attempts knee flexion RLE Sensation: WNL (pt denies difference in sensation) RLE Coordination: WNL    ADLs  Overall ADL's : Needs assistance/impaired Eating/Feeding: Moderate assistance,Bed level Eating/Feeding Details (indicate cue type and  reason): positioned upright with tray positioned to help with self feeding Grooming: Maximal assistance,Sitting Toilet Transfer: Maximal assistance Toilet Transfer Details (indicate cue type and reason): pt transferred from bed to toilet with R LE blocked. pt needed step by step sequence to progress Functional mobility during ADLs: Maximal assistance General ADL Comments: pt motivated to transfer to the bathroom this session. wife present during session and very helpful with line management. Pt with R inattention    Mobility  Overal bed mobility: Needs Assistance Bed Mobility: Supine to Sit Rolling: Min assist Sidelying to sit: Mod assist Supine to sit: Supervision,HOB elevated Sit to supine: Mod assist Sit to sidelying: Mod assist General bed mobility comments: use of bed rails and increased time    Transfers  Overall transfer level: Needs assistance Equipment used: Right platform walker,None Transfers: Sit to/from Stand Sit to Stand: Min assist,Min guard Stand pivot transfers: Mod assist General transfer comment: minA from bed with platform walker, minG from recliner with use of armrest    Ambulation / Gait / Stairs / Wheelchair Mobility  Ambulation/Gait Ambulation/Gait assistance: Herbalist (Feet): 125 Feet Assistive device: Right platform walker Gait Pattern/deviations: Step-through pattern General Gait Details: pt with step-to gait initially, PT encourages a more fluid step-through gait pattern which pt is able to achieve. With fatigue pt demonstrates R foot drag and multiple losses of balance due to catching his foot on ground or narrowed BOS Gait velocity: reduced Gait velocity interpretation: <1.8 ft/sec, indicate of risk for recurrent falls    Posture / Balance Dynamic Sitting Balance Sitting balance - Comments: reliant on RUE support of railing Balance Overall balance assessment: Needs assistance Sitting-balance support: Feet supported,Single extremity  supported Sitting balance-Leahy Scale: Poor Sitting balance - Comments: reliant on RUE support of railing Standing balance support: No upper extremity supported Standing balance-Leahy Scale: Fair Standing balance comment: close supervision for brief periods at end of session    Special needs/care consideration Designated visitor Bryceton Hantz     Previous Home Environment (from acute therapy documentation) Living Arrangements: Spouse/significant other,Children  Lives With: Spouse Available Help at Discharge: Family,Available 24 hours/day Type of Home: House Home Layout: One level Home Access: Stairs to enter Entrance Stairs-Rails: Mccormick reach both Entrance Stairs-Number of Steps: 5 or 8 Bathroom Shower/Tub: Multimedia programmer: Standard Home Care Services: No Additional Comments: son that is 28 yo that works but lives in the home. 2 cats inside and 2 outside dogs  Discharge Living Setting Plans for Discharge Living Setting: Patient's home Type of Home at Discharge: House Discharge Home Layout: One level Discharge Home Access: Stairs to enter (side door, primary entrance) Entrance Stairs-Rails: Mccormick reach both  Entrance Stairs-Number of Steps: 8 Discharge Bathroom Shower/Tub: Walk-in shower Discharge Bathroom Toilet: Standard Discharge Bathroom Accessibility: Yes How Accessible: Accessible via walker (tight fight, may not reach the toilet) Does the patient have any problems obtaining your medications?: No  Social/Family/Support Systems Patient Roles: Spouse Anticipated Caregiver: spouse, [name], as well as pt's sister and brother Anticipated Caregiver's Contact Information: [name]: [phone] Ability/Limitations of Caregiver: Mary works [employer], pt's sister and brother will stay with him during that time if he needs supervision Caregiver Availability: 24/7 Discharge Plan Discussed with Primary Caregiver: Yes Is Caregiver In Agreement with Plan?: Yes Does Caregiver/Family  have Issues with Lodging/Transportation while Pt is in Rehab?: No   Goals Patient/Family Goal for Rehab: PT/OT supervision to mod I, SLP n/a Expected length of stay: 5-7 days Pt/Family Agrees to Admission and willing to participate: Yes Program Orientation Provided & Reviewed with Pt/Caregiver Including Roles  & Responsibilities: Yes   Decrease burden of Care through IP rehab admission: n/a  Possible need for SNF placement upon discharge: No.   Patient Condition: This patient's medical and functional status has changed since the consult dated: 02/19/20 in which the Rehabilitation Physician determined and documented that the patient's condition is appropriate for intensive rehabilitative care in an inpatient rehabilitation facility. See "History of Present Illness" (above) for medical update. Functional changes are: pt min assist with mobility. Patient's medical and functional status update has been discussed with the Rehabilitation physician and patient remains appropriate for inpatient rehabilitation. Will admit to inpatient rehab today.  Preadmission Screen Completed By:  Michel Santee, PT, DPT 02/21/2020 10 32 AM ______________________________________________________________________   Discussed status with Dr. Posey Pronto on 02/21/20 at  10:36 AM  and received approval for admission today.  Admission Coordinator:  Michel Santee, PT, DPT time 10:36 AM Sudie Grumbling 02/21/20

## 2020-02-21 NOTE — Progress Notes (Signed)
Patient transferred to floor from 3W via wheelchair by 3W staff with wife by staff. Patient alert and oriented and denies pain at this time. Patient oriented to floor and made aware of plan of care and safety. Call bell within reach and bed in lowest position. Will continue to monitor.

## 2020-02-21 NOTE — TOC Transition Note (Signed)
Transition of Care [facility]) - CM/SW Discharge Note   Patient Details  Name: Brent Ballen Sr. MRN: [id number] Date of Birth: April 25, 1950  Transition of Care [facility]) CM/SW Contact:  Pollie Friar, RN Phone Number: 02/21/2020, 11:21 AM   Clinical Narrative:    Pt is discharging to CIR today. CM signing off.   Final next level of care: IP Rehab Facility Barriers to Discharge: No Barriers Identified   Patient Goals and CMS Choice     Choice offered to / list presented to : Patient  Discharge Placement                       Discharge Plan and Services                                     Social Determinants of Health (SDOH) Interventions     Readmission Risk Interventions No flowsheet data found.

## 2020-02-21 NOTE — Progress Notes (Signed)
  Loop site assessed this am.    Pt had Eskenazi bleeding with pooling in Tegaderm.   Tegaderm removed and area cleaned with sterile saline wipe, taking great care to not moisten the steri-strips.   No active bleeding. Reviewed site care with patient (Apply gentle but firm pressure x 5 minutes with two fingers if bleeding occurs).   Reviewed site as below with Dr. Curt Bears.   No change at this time. Continue usual site care.   Legrand Como [address] [place], PA-C  02/21/2020 10:29 AM

## 2020-02-21 NOTE — Plan of Care (Signed)
  Problem: Education: Goal: Knowledge of disease or condition will improve Outcome: Progressing Goal: Knowledge of secondary prevention will improve Outcome: Progressing Goal: Knowledge of patient specific risk factors addressed and post discharge goals established will improve Outcome: Progressing   Problem: Coping: Goal: Will verbalize positive feelings about self Outcome: Progressing Goal: Will identify appropriate support needs Outcome: Progressing   Problem: Health Behavior/Discharge Planning: Goal: Ability to manage health-related needs will improve Outcome: Progressing   Problem: Self-Care: Goal: Ability to participate in self-care as condition permits will improve Outcome: Progressing Goal: Verbalization of feelings and concerns over difficulty with self-care will improve Outcome: Progressing   Problem: Ischemic Stroke/TIA Tissue Perfusion: Goal: Complications of ischemic stroke/TIA will be minimized Outcome: Progressing   Problem: Education: Goal: Knowledge of General Education information will improve Description: Including pain rating scale, medication(s)/side effects and non-pharmacologic comfort measures Outcome: Progressing   Problem: Health Behavior/Discharge Planning: Goal: Ability to manage health-related needs will improve Outcome: Progressing   Problem: Clinical Measurements: Goal: Ability to maintain clinical measurements within normal limits will improve Outcome: Progressing Goal: Will remain free from infection Outcome: Progressing Goal: Diagnostic test results will improve Outcome: Progressing Goal: Respiratory complications will improve Outcome: Progressing Goal: Cardiovascular complication will be avoided Outcome: Progressing   Problem: Activity: Goal: Risk for activity intolerance will decrease Outcome: Progressing   Problem: Nutrition: Goal: Adequate nutrition will be maintained Outcome: Progressing   Problem: Coping: Goal: Level of  anxiety will decrease Outcome: Progressing   Problem: Elimination: Goal: Will not experience complications related to bowel motility Outcome: Progressing Goal: Will not experience complications related to urinary retention Outcome: Progressing   Problem: Pain Managment: Goal: General experience of comfort will improve Outcome: Progressing   Problem: Safety: Goal: Ability to remain free from injury will improve Outcome: Progressing   Problem: Skin Integrity: Goal: Risk for impaired skin integrity will decrease Outcome: Progressing   

## 2020-02-21 NOTE — Discharge Summary (Addendum)
Stroke Discharge Summary  Patient ID: Brent Mccormick   MRN: [id number]      DOB: 28-Dec-1950  Date of Admission: 02/16/2020 Date of Discharge: 02/21/2020  Attending Physician:  No att. providers found, Stroke MD Consultant(s): Dr. Curt Bears, [facility]  Patient's PCP:  Dorothyann Peng, NP  Discharge Diagnoses: 1. Cryptogenic large left basal ganglia CVA s/p IV TPA administration 2. Right hemiparesis 3.  S/p Loop recorder placement   Active Problems:   Left sided lacunar infarction [facility])   Right hemiparesis (HCC)   Leukocytosis   Sinus tachycardia   Dyslipidemia   History of gout  Medications to be continued on Rehab Allergies as of 02/21/2020   No Known Allergies      Medication List     STOP taking these medications    aspirin 81 MG tablet Replaced by: aspirin 81 MG EC tablet   multivitamin tablet   vitamin C 1000 MG tablet   zinc gluconate 50 MG tablet       TAKE these medications    acetaminophen 325 MG tablet Commonly known as: TYLENOL Take 2 tablets (650 mg total) by mouth every 4 (four) hours as needed for mild pain (or temp > 37.5 C (99.5 F)).   aspirin 81 MG EC tablet Take 1 tablet (81 mg total) by mouth daily. Swallow whole. Start taking on: February 22, 2020 Replaces: aspirin 81 MG tablet   cholecalciferol 25 MCG (1000 UNIT) tablet Commonly known as: VITAMIN D3 Take 1,000 Units by mouth daily.   clopidogrel 75 MG tablet Commonly known as: PLAVIX Take 1 tablet (75 mg total) by mouth daily. Start taking on: February 22, 2020   colchicine 0.6 MG tablet Take 1 tablet (0.6 mg total) by mouth every other day.   enoxaparin 40 MG/0.4ML injection Commonly known as: LOVENOX Inject 0.4 mLs (40 mg total) into the skin daily. Start taking on: February 22, 2020   losartan 100 MG tablet Commonly known as: COZAAR Take 1 tablet (100 mg total) by mouth daily.   rosuvastatin 20 MG tablet Commonly known as: CRESTOR Take 1 tablet (20 mg total) by  mouth daily. Start taking on: February 22, 2020 What changed:  medication strength how much to take when to take this   senna-docusate 8.6-50 MG tablet Commonly known as: Senokot-S Take 1 tablet by mouth 2 (two) times daily.        LABORATORY STUDIES CBC    Component Value Date/Time   WBC 10.9 (H) 02/18/2020 1116   RBC 4.82 02/18/2020 1116   HGB 15.2 02/18/2020 1116   HCT 43.2 02/18/2020 1116   PLT 262 02/18/2020 1116   MCV 89.6 02/18/2020 1116   MCH 31.5 02/18/2020 1116   MCHC 35.2 02/18/2020 1116   RDW 12.9 02/18/2020 1116   LYMPHSABS 1.5 02/18/2020 1116   MONOABS 0.8 02/18/2020 1116   EOSABS 0.1 02/18/2020 1116   BASOSABS 0.1 02/18/2020 1116   CMP    Component Value Date/Time   NA 138 02/21/2020 0511   K 3.8 02/21/2020 0511   CL 104 02/21/2020 0511   CO2 20 (L) 02/21/2020 0511   GLUCOSE 117 (H) 02/21/2020 0511   BUN 24 (H) 02/21/2020 0511   CREATININE 1.11 02/21/2020 0511   CREATININE 1.01 07/04/2016 1537   CALCIUM 8.9 02/21/2020 0511   PROT 7.8 02/16/2020 1754   ALBUMIN 4.4 02/16/2020 1754   AST 22 02/16/2020 1754   ALT 21 02/16/2020 1754   ALKPHOS 56 02/16/2020 1754  BILITOT 0.5 02/16/2020 1754   GFRNONAA >60 02/21/2020 0511   GFRNONAA 77 07/04/2016 1537   GFRAA 89 07/04/2016 1537   COAGS Lab Results  Component Value Date   INR 1.0 02/16/2020   INR 1.0 01/20/2016   Lipid Panel    Component Value Date/Time   CHOL 174 02/17/2020 0304   TRIG 64 02/17/2020 0304   HDL 40 (L) 02/17/2020 0304   CHOLHDL 4.4 02/17/2020 0304   VLDL 13 02/17/2020 0304   LDLCALC 121 (H) 02/17/2020 0304   HgbA1C  Lab Results  Component Value Date   HGBA1C 5.5 02/17/2020   Urinalysis    Component Value Date/Time   COLORURINE YELLOW 05/06/2010 0748   APPEARANCEUR CLEAR 05/06/2010 0748   LABSPEC 1.025 05/06/2010 0748   PHURINE 6.0 05/06/2010 0748   GLUCOSEU NEGATIVE 05/06/2010 0748   HGBUR NEGATIVE 05/06/2010 0748   HGBUR negative 03/20/2008 1025    BILIRUBINUR n 12/17/2015 1159   KETONESUR NEGATIVE 05/06/2010 0748   PROTEINUR 2+ 12/17/2015 1159   UROBILINOGEN 0.2 12/17/2015 1159   UROBILINOGEN 0.2 05/06/2010 0748   NITRITE n 12/17/2015 1159   NITRITE NEGATIVE 05/06/2010 0748   LEUKOCYTESUR Negative 12/17/2015 1159   Urine Drug Screen     Component Value Date/Time   LABOPIA NONE DETECTED 02/17/2020 0713   COCAINSCRNUR NONE DETECTED 02/17/2020 0713   LABBENZ NONE DETECTED 02/17/2020 0713   AMPHETMU NONE DETECTED 02/17/2020 0713   THCU NONE DETECTED 02/17/2020 0713   LABBARB NONE DETECTED 02/17/2020 0713    Alcohol Level No results found for: [facility]   SIGNIFICANT DIAGNOSTIC STUDIES MR BRAIN WO CONTRAST  Result Date: 02/17/2020 CLINICAL DATA:  Follow-up stroke 24 hours post tPA. Right-sided weakness. EXAM: MRI HEAD WITHOUT CONTRAST TECHNIQUE: Multiplanar, multiecho pulse sequences of the brain and surrounding structures were obtained without intravenous contrast. COMPARISON:  CT studies done yesterday. FINDINGS: Brain: Diffusion imaging shows acute infarction in the left basal ganglia and radiating white matter tracts. No other acute insult. The brainstem and cerebellum are normal. Cerebral hemispheres elsewhere show few old small vessel infarctions of the white matter and of the right basal ganglia. No large vessel territory infarction. No mass lesion, hemorrhage, hydrocephalus or extra-axial collection. Vascular: Major vessels at the base of the brain show flow. Skull and upper cervical spine: Negative Sinuses/Orbits: Clear/normal Other: None IMPRESSION: 1. Acute infarction in the left basal ganglia and radiating white matter tracts. No mass effect or hemorrhage. 2. Mild chronic small-vessel ischemic changes elsewhere affecting the cerebral hemispheric white matter and right basal ganglia. Electronically Signed   By: Nelson Chimes M.D.   On: 02/17/2020 12:52   EP PPM/ICD IMPLANT  Result Date: 02/19/2020 SURGEON:  Allegra Lai, MD    PREPROCEDURE DIAGNOSIS:  Cryptogenic Stroke   POSTPROCEDURE DIAGNOSIS:  Cryptogenic Stroke    PROCEDURES:  1. Implantable loop recorder implantation   INTRODUCTION:  Brent Jacoby Sr. is a 70 y.o. male with a history of unexplained stroke who presents today for implantable loop implantation.  The patient has had a cryptogenic stroke.  Despite an extensive workup by neurology, no reversible causes have been identified.  he has worn telemetry during which he did not have arrhythmias.  There is significant concern for possible atrial fibrillation as the cause for the patients stroke.  The patient therefore presents today for implantable loop implantation.   DESCRIPTION OF PROCEDURE:  Informed written consent was obtained, and the patient was brought to the electrophysiology lab in a fasting state.  The patient required  no sedation for the procedure today.  Mapping over the patient's chest was performed by the EP lab staff to identify the area where electrograms were most prominent for ILR recording.  This area was found to be the left parasternal region over the 3rd-4th intercostal space. The patients left chest was therefore prepped and draped in the usual sterile fashion by the EP lab staff. The skin overlying the left parasternal region was infiltrated with lidocaine for local analgesia.  A 0.5-cm incision was made over the left parasternal region over the 3rd intercostal space.  A subcutaneous ILR pocket was fashioned using a combination of sharp and blunt dissection.  A Medtronic Reveal Linq model [place] [id number] implantable loop recorder was then placed into the pocket  R waves were very prominent and measured 0.28mV. EBL<1 ml.  Steri- Strips and a sterile dressing were then applied.  There were no [place] apparent complications.   CONCLUSIONS:  1. Successful implantation of a Medtronic Reveal LINQ implantable loop recorder for cryptogenic stroke  2. No [place] apparent complications.   ECHOCARDIOGRAM  COMPLETE  Result Date: 02/17/2020    ECHOCARDIOGRAM REPORT   Patient Name:   Brent Ruoff Sr. Date of Exam: 02/17/2020 Medical Rec #:  [id number]         Height:       69.0 in Accession #:    [id number]        Weight:       172.8 lb Date of Birth:  06/14/1950        BSA:          1.942 m Patient Age:    41 years          BP:           126/77 mmHg Patient Gender: M                 HR:           46 bpm. Exam Location:  Inpatient Procedure: 2D Echo, Cardiac Doppler and Color Doppler Indications:    Stroke  History:        Patient has no prior history of Echocardiogram examinations.                 Risk Factors:Hypertension, Dyslipidemia, Diabetes and Former                 Smoker.  Sonographer:    Clayton Lefort RDCS (AE) Referring Phys: [id number] [place]  1. Left ventricular ejection fraction, by estimation, is 60 to 65%. The left ventricle has normal function. The left ventricle has no regional wall motion abnormalities. There is severe left ventricular hypertrophy of the basal-septal segment. Left ventricular diastolic parameters are consistent with Grade I diastolic dysfunction (impaired relaxation).  2. Right ventricular systolic function is normal. The right ventricular size is normal. Tricuspid regurgitation signal is inadequate for assessing PA pressure.  3. The mitral valve is normal in structure. No evidence of mitral valve regurgitation. No evidence of mitral stenosis.  4. The aortic valve is tricuspid. There is moderate calcification of the aortic valve. There is moderate thickening of the aortic valve. Aortic valve regurgitation is not visualized. Mild to moderate aortic valve sclerosis/calcification is present, without any evidence of aortic stenosis.  5. The inferior vena cava is normal in size with greater than 50% respiratory variability, suggesting right atrial pressure of 3 mmHg. FINDINGS  Left Ventricle: Left ventricular ejection fraction, by estimation, is 60 to 65%. The  left  ventricle has normal function. The left ventricle has no regional wall motion abnormalities. The left ventricular internal cavity size was normal in size. There is  severe left ventricular hypertrophy of the basal-septal segment. Left ventricular diastolic parameters are consistent with Grade I diastolic dysfunction (impaired relaxation). Normal left ventricular filling pressure. Right Ventricle: The right ventricular size is normal. No increase in right ventricular wall thickness. Right ventricular systolic function is normal. Tricuspid regurgitation signal is inadequate for assessing PA pressure. Left Atrium: Left atrial size was normal in size. Right Atrium: Right atrial size was normal in size. Pericardium: There is no evidence of pericardial effusion. Mitral Valve: The mitral valve is normal in structure. Mild to moderate mitral annular calcification. No evidence of mitral valve regurgitation. No evidence of mitral valve stenosis. MV peak gradient, 2.5 mmHg. The mean mitral valve gradient is 1.0 mmHg. Tricuspid Valve: The tricuspid valve is normal in structure. Tricuspid valve regurgitation is trivial. No evidence of tricuspid stenosis. Aortic Valve: The aortic valve is tricuspid. There is moderate calcification of the aortic valve. There is moderate thickening of the aortic valve. Aortic valve regurgitation is not visualized. Mild to moderate aortic valve sclerosis/calcification is present, without any evidence of aortic stenosis. Aortic valve mean gradient measures 3.0 mmHg. Aortic valve peak gradient measures 4.7 mmHg. Aortic valve area, by VTI measures 2.89 cm. Pulmonic Valve: The pulmonic valve was normal in structure. Pulmonic valve regurgitation is not visualized. No evidence of pulmonic stenosis. Aorta: The aortic root is normal in size and structure. Venous: The inferior vena cava is normal in size with greater than 50% respiratory variability, suggesting right atrial pressure of 3 mmHg. IAS/Shunts:  The interatrial septum appears to be lipomatous. No atrial level shunt detected by color flow Doppler.  LEFT VENTRICLE PLAX 2D LVIDd:         3.50 cm  Diastology LVIDs:         2.40 cm  LV e' medial:    6.31 cm/s LV PW:         1.30 cm  LV E/e' medial:  8.4 LV IVS:        1.70 cm  LV e' lateral:   10.40 cm/s LVOT diam:     2.20 cm  LV E/e' lateral: 5.1 LV SV:         58 LV SV Index:   30 LVOT Area:     3.80 cm  RIGHT VENTRICLE             IVC RV Basal diam:  2.70 cm     IVC diam: 1.70 cm RV S prime:     14.10 cm/s TAPSE (M-mode): 2.2 cm LEFT ATRIUM             Index       RIGHT ATRIUM           Index LA diam:        3.50 cm 1.80 cm/m  RA Area:     12.70 cm LA Vol (A2C):   48.8 ml 25.13 ml/m RA Volume:   27.10 ml  13.96 ml/m LA Vol (A4C):   25.2 ml 12.98 ml/m LA Biplane Vol: 34.5 ml 17.77 ml/m  AORTIC VALVE AV Area (Vmax):    2.87 cm AV Area (Vmean):   2.63 cm AV Area (VTI):     2.89 cm AV Vmax:           108.00 cm/s AV Vmean:  75.400 cm/s AV VTI:            0.201 m AV Peak Grad:      4.7 mmHg AV Mean Grad:      3.0 mmHg LVOT Vmax:         81.60 cm/s LVOT Vmean:        52.200 cm/s LVOT VTI:          0.153 m LVOT/AV VTI ratio: 0.76  AORTA Ao Root diam: 3.50 cm Ao Asc diam:  3.30 cm MITRAL VALVE MV Area (PHT): 2.80 cm    SHUNTS MV Area VTI:   2.52 cm    Systemic VTI:  0.15 m MV Peak grad:  2.5 mmHg    Systemic Diam: 2.20 cm MV Mean grad:  1.0 mmHg MV Vmax:       0.79 m/s MV Vmean:      43.0 cm/s MV Decel Time: 271 msec MV E velocity: 52.90 cm/s MV A velocity: 58.80 cm/s MV E/A ratio:  0.90 Fransico Him MD Electronically signed by Fransico Him MD Signature Date/Time: 02/17/2020/6:55:55 PM    Final    CT HEAD CODE STROKE WO CONTRAST  Result Date: 02/16/2020 CLINICAL DATA:  Code stroke.  Acute neuro deficit EXAM: CT HEAD WITHOUT CONTRAST TECHNIQUE: Contiguous axial images were obtained from the base of the skull through the vertex without intravenous contrast. COMPARISON:  None. FINDINGS: Brain:  Well-defined hypodensity in the right corona radiata compatible with chronic white matter infarct. Negative for acute infarct, hemorrhage, or mass. Ventricle size normal. Vascular: Negative for hyperdense vessel. Atherosclerotic calcification in the carotid and vertebral arteries. Skull: Negative Sinuses/Orbits: Paranasal sinuses clear.  Negative orbit Other: None ASPECTS ([place] Stroke Program Miggins CT Score) - Ganglionic level infarction (caudate, lentiform nuclei, internal capsule, insula, M1-M3 cortex): 7 - Supraganglionic infarction (M4-M6 cortex): 3 Total score (0-10 with 10 being normal): 10 IMPRESSION: 1. No acute abnormality identified 2. ASPECTS is 10 3. Chronic infarct in the right corona radiata. 4. Code stroke imaging results were communicated on 02/16/2020 at 6:06 pm to provider Bhagat via text page Electronically Signed   By: Franchot Gallo M.D.   On: 02/16/2020 18:07   CT ANGIO HEAD CODE STROKE  Result Date: 02/16/2020 CLINICAL DATA:  Acute neuro deficit. EXAM: CT ANGIOGRAPHY HEAD AND NECK TECHNIQUE: Multidetector CT imaging of the head and neck was performed using the standard protocol during bolus administration of intravenous contrast. Multiplanar CT image reconstructions and MIPs were obtained to evaluate the vascular anatomy. Carotid stenosis measurements (when applicable) are obtained utilizing NASCET criteria, using the distal internal carotid diameter as the denominator. CONTRAST:  43mL OMNIPAQUE IOHEXOL 350 MG/ML SOLN COMPARISON:  CT head 02/16/2020 FINDINGS: CTA NECK FINDINGS Aortic arch: Standard branching. Imaged portion shows no evidence of aneurysm or dissection. No significant stenosis of the major arch vessel origins. Right carotid system: Mild atherosclerotic disease right carotid bifurcation without stenosis. Left carotid system: Mild atherosclerotic disease left carotid bifurcation without stenosis. Vertebral arteries: Both vertebral arteries are patent to the basilar without  stenosis. Skeleton: Cervical spondylosis.  No acute skeletal abnormality. Other neck: Negative for mass or adenopathy. 8 mm calcification right thyroid nodule. No further imaging necessary. Upper chest: Lung apices clear bilaterally. Review of the MIP images confirms the above findings CTA HEAD FINDINGS Anterior circulation: Mild atherosclerotic disease in the cavernous carotid bilaterally without stenosis. Anterior and middle cerebral arteries patent bilaterally without large vessel occlusion. Mild atherosclerotic irregularity in the anterior and middle cerebral arteries bilaterally  without significant stenosis. Posterior circulation: Both vertebral arteries patent to the basilar. PICA patent bilaterally. Basilar widely patent. Superior cerebellar arteries patent bilaterally. Moderate stenosis P2 segment bilaterally. No large vessel occlusion Venous sinuses: Normal venous enhancement. Anatomic variants: None Review of the MIP images confirms the above findings IMPRESSION: 1. Negative for intracranial large vessel occlusion 2. Intracranial atherosclerotic disease with moderate stenosis in the P2 segment bilaterally. Mild atherosclerotic irregularity in the anterior and middle cerebral arteries. 3. No significant carotid or vertebral artery stenosis in the neck. Electronically Signed   By: Franchot Gallo M.D.   On: 02/16/2020 18:41   CT ANGIO NECK CODE STROKE  Result Date: 02/16/2020 CLINICAL DATA:  Acute neuro deficit. EXAM: CT ANGIOGRAPHY HEAD AND NECK TECHNIQUE: Multidetector CT imaging of the head and neck was performed using the standard protocol during bolus administration of intravenous contrast. Multiplanar CT image reconstructions and MIPs were obtained to evaluate the vascular anatomy. Carotid stenosis measurements (when applicable) are obtained utilizing NASCET criteria, using the distal internal carotid diameter as the denominator. CONTRAST:  59mL OMNIPAQUE IOHEXOL 350 MG/ML SOLN COMPARISON:  CT head  02/16/2020 FINDINGS: CTA NECK FINDINGS Aortic arch: Standard branching. Imaged portion shows no evidence of aneurysm or dissection. No significant stenosis of the major arch vessel origins. Right carotid system: Mild atherosclerotic disease right carotid bifurcation without stenosis. Left carotid system: Mild atherosclerotic disease left carotid bifurcation without stenosis. Vertebral arteries: Both vertebral arteries are patent to the basilar without stenosis. Skeleton: Cervical spondylosis.  No acute skeletal abnormality. Other neck: Negative for mass or adenopathy. 8 mm calcification right thyroid nodule. No further imaging necessary. Upper chest: Lung apices clear bilaterally. Review of the MIP images confirms the above findings CTA HEAD FINDINGS Anterior circulation: Mild atherosclerotic disease in the cavernous carotid bilaterally without stenosis. Anterior and middle cerebral arteries patent bilaterally without large vessel occlusion. Mild atherosclerotic irregularity in the anterior and middle cerebral arteries bilaterally without significant stenosis. Posterior circulation: Both vertebral arteries patent to the basilar. PICA patent bilaterally. Basilar widely patent. Superior cerebellar arteries patent bilaterally. Moderate stenosis P2 segment bilaterally. No large vessel occlusion Venous sinuses: Normal venous enhancement. Anatomic variants: None Review of the MIP images confirms the above findings IMPRESSION: 1. Negative for intracranial large vessel occlusion 2. Intracranial atherosclerotic disease with moderate stenosis in the P2 segment bilaterally. Mild atherosclerotic irregularity in the anterior and middle cerebral arteries. 3. No significant carotid or vertebral artery stenosis in the neck. Electronically Signed   By: Franchot Gallo M.D.   On: 02/16/2020 18:41    HISTORY OF PRESENT ILLNESS Mr. Quyen Bondoc Sr. is a 70 y.o. male with PMHx of hypertension, hyperlipidemia, diabetes, gout, former  smoker quit in 1968 presented to ED with right arm and right leg weakness and right facial droop.  Patient was noncompliant with his statins but endorses taking aspirin daily.  He received TPA at 6:13 p.m. on 02/16/2020. CT head did not show any acute abnormality. He was admitted for further neurologic evaluation and care.    HOSPITAL COURSE is a 70 yo male admitted to the neurologic ICU post tPA administration in the ED. Intensive neurology monitoring was provided with strict blood pressure control. Further stroke work up was undertaken. CTA did not show any intracranial large vessel occlusion.  MRI post TPA showed acute infarction in left basal ganglia and radiating white matter tracts. 2D Echo showed EF 60-65% with severe left ventricular hypertrophy of the basal-septal segment. Left ventricular diastolic parameters  consistent with Grade  I diastolic dysfunction. LDL was noted to be 121. Hgb A1C was 5.5. Crestor 20mg  daily was initiated. ASA 81mg  and Plavix 75 mg were initiated with a plan to continue x 3 weeks and then Plavix alone. Dr. Curt Bears was consulted for Loop placement. After informed consent was obtained patient underwent Loop placement on 2/3. He tolerated the procedure well.  Patient made progress with slightly improved strength in right hemibody. Physical, occupational and speech therapists were consulted and provided evaluation, treatment and discharge planning. CIR was recommended and arranged. He was discharged to CIR in good condition.   DISCHARGE EXAM Blood pressure 126/84, pulse 98, temperature 98 F (36.7 C), temperature source Oral, resp. rate 18, weight 78.4 kg, SpO2 98 %. PHYSICAL EXAM Constitutional: Appears well-developed and well-nourished.  Psych: Affect appropriate to situation Eyes: No scleral injection HENT: No OP obstruction, good dentition MSK: no joint deformities.  Cardiovascular: Normal rate and regular rhythm.  Respiratory: Effort normal, non-labored  breathing GI: Soft.  No distension. There is no tenderness.  Skin: WDI   Mental Status: Sitting up in bed in NAD. Alert and oriented x4. Speech fluent without evidence of aphasia.  Able to follow 3 step commands without difficulty. Cranial Nerves: II:  Visual fields grossly normal, pupils equal, round, reactive to light and accommodation III,IV, VI: ptosis not present, extra-ocular motions intact bilaterally V,VII: smile asymmetric, right facial droop, facial light touch sensation normal bilaterally VIII: hearing normal bilaterally IX,X: uvula rises symmetrically XI: bilateral shoulder shrug XII: midline tongue extension without atrophy or fasciculations   Motor: Right :  Upper extremity   2+/5                                                Left:     Upper extremity   5/5             Lower extremity   3+/5                                                            Lower extremity   5/5 Tone and bulk:normal tone throughout; no atrophy noted Sensory: Light touch intact throughout, bilaterally   Plantars: Right: downgoing                                Left: downgoing Cerebellar: normal finger-to-nose Gait: Deferred  Discharge Diet      Diet   Diet Heart Room service appropriate? Yes with Assist; Fluid consistency: Thin   liquids  DISCHARGE PLAN Disposition:  Transfer to [place] for ongoing PT, OT and ST Recommend ongoing stroke risk factor control by Primary Care Physician at time of discharge from inpatient rehabilitation. Follow-up PCP Nafziger, Tommi Rumps, NP in 2 weeks following discharge from rehab. Follow-up in [facility] in 4 weeks following discharge from rehab, office to schedule an appointment.   45 minutes were spent preparing discharge.   I have personally obtained history,examined this patient, reviewed notes, independently viewed imaging studies, participated in medical decision making and plan of care.ROS completed  by me personally and pertinent positives  fully documented  I have made any additions or clarifications directly to the above note. Agree with note above.    Antony Contras, MD Medical Director [facility] Pager: [phone] 02/25/2020 8:13 AM

## 2020-02-21 NOTE — Progress Notes (Signed)
Inpatient Rehabilitation Medication Review by a Pharmacist  A complete drug regimen review was completed for this patient to identify any potential clinically significant medication issues.  Clinically significant medication issues were identified:  No.  Check AMION for pharmacist assigned to patient if future medication questions/issues arise during this admission.  Time spent performing this drug regimen review (minutes):  6 min.   Blenda Nicely 02/21/2020 7:58 PM

## 2020-02-21 NOTE — Progress Notes (Signed)
Report given to [facility] on 4W. RN made aware of MEWS protocol initiated and follow up vitals needed at 1615, 1815, 2215, and 0215.

## 2020-02-21 NOTE — Progress Notes (Signed)
Physical Medicine and Rehabilitation Consult Reason for Consult: Right side weakness Referring Physician: [facility]     HPI: Brent Bogdanski Sr. is a 70 y.o. right-handed male with history of gout, type 2 diabetes mellitus, hyperlipidemia, hypertension, quit smoking 54 years ago.  Per chart review patient lives with spouse.  Wife is an Therapist, sports.  1 level home 5 steps to entry.  Independent prior to admission and active.  Presented 02/16/2020 with right side weakness and facial droop.  Cranial CT scan negative for acute changes.  Chronic infarct right corona radiata.  CT angiogram of head and neck negative for large vessel occlusion.  Patient did  receive TPA.  MRI showed acute infarction left basal ganglia and radiating white matter tracts.  No mass-effect or hemorrhage.  Echocardiogram with ejection fraction of 60 to 65% no wall motion abnormalities grade 1 diastolic dysfunction..  Admission chemistries unremarkable except BUN 24 creatinine 1.41, WBC 10,800.  Neurology consulted and currently maintained on aspirin 81 mg daily and Plavix 75 mg day x3 weeks then Plavix alone.  Therapy evaluations completed due to right-sided weakness recommendations were for physical medicine rehab consult.   Pt reports LBM Sunday- before admission. Denies constipation. Urinating well- denies any pain, including HA's.    Review of Systems  Constitutional: Negative for chills and fever.  HENT: Negative for hearing loss.   Eyes: Negative for blurred vision and double vision.  Respiratory: Negative for cough and shortness of breath.   Cardiovascular: Negative for chest pain, palpitations and leg swelling.  Gastrointestinal: Positive for constipation. Negative for heartburn, nausea and vomiting.  Genitourinary: Negative for dysuria, flank pain and hematuria.  Musculoskeletal: Positive for joint pain and myalgias.  Skin: Negative for rash.  Neurological: Positive for weakness.  All other systems reviewed and are  negative.       Past Medical History:  Diagnosis Date  . Carpal tunnel syndrome    . Gout    . Hyperlipidemia      on meds  . Hypertension      on meds         Past Surgical History:  Procedure Laterality Date  . COLONOSCOPY   2015    DJ-MAC-polyps  . no prior surgery      . POLYPECTOMY   2015    DJ-MAC-polyps         Family History  Problem Relation Age of Onset  . Ovarian cancer Mother    . Heart disease Father    . Hypertension Father    . Colon polyps Sister 26  . Colon cancer Neg Hx    . Esophageal cancer Neg Hx    . Rectal cancer Neg Hx    . Stomach cancer Neg Hx      Social History:  reports that he quit smoking about 54 years ago. He has never used smokeless tobacco. He reports current alcohol use. He reports that he does not use drugs. Allergies: No Known Allergies          Facility-Administered Medications Prior to Admission  Medication Dose Route Frequency Provider Last Rate Last Admin  . triamcinolone acetonide (KENALOG) 10 MG/ML injection 10 mg  10 mg Other Once Landis Martins, DPM              Medications Prior to Admission  Medication Sig Dispense Refill  . Ascorbic Acid (VITAMIN C) 1000 MG tablet Take 1,000 mg by mouth daily.      Marland Kitchen  aspirin 81 MG tablet Take 81 mg by mouth daily.      . cholecalciferol (VITAMIN D3) 25 MCG (1000 UNIT) tablet Take 1,000 Units by mouth daily.      . colchicine 0.6 MG tablet Take 1 tablet (0.6 mg total) by mouth every other day. 45 tablet 3  . losartan (COZAAR) 100 MG tablet Take 1 tablet (100 mg total) by mouth daily. 90 tablet 3  . Multiple Vitamin (MULTIVITAMIN) tablet Take 1 tablet by mouth daily.      . rosuvastatin (CRESTOR) 5 MG tablet Take 1 tablet (5 mg total) by mouth at bedtime. 90 tablet 3  . zinc gluconate 50 MG tablet Take 50 mg by mouth daily.          Home: Home Living Family/patient expects to be discharged to:: Private residence Living Arrangements: Spouse/significant other,Children Available  Help at Discharge: Family,Available 24 hours/day Type of Home: House Home Access: Stairs to enter [employer] of Steps: 5 or 8 Entrance Stairs-Rails: Can reach both Home Layout: One level Bathroom Shower/Tub: Multimedia programmer: Standard Home Equipment: Shower seat - built in,Grab bars - tub/shower,Crutches,Hand held shower head Additional Comments: son that is 6 yo that works but lives in the home. 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 Wife is RN at [employer], can be with pt 24/7 tues-fri and other family can be available to cover other days Functional Status:  Mobility: Bed Mobility Overal bed mobility: Needs Assistance Bed Mobility: Rolling,Supine to Sit,Sit to Supine Rolling: Min assist Supine to sit: Mod assist Sit to supine: Mod assist General bed mobility comments: minA to complete roll to L side and to manage RUE. modA to RLE and to complete elvation of trunk from [facility]. pt able to manage some scooting and repositioning with L extremities Transfers Overall transfer level: Needs assistance Equipment used: 1 person hand held assist Transfers: Sit to/from Stand Sit to Stand: Mod assist General transfer comment: modA to initiate stand and to block R knee with power up to standing. modA to maintain static stand without UE support Ambulation/Gait Ambulation/Gait assistance: Mod assist Gait Distance (Feet): 3 Feet Assistive device: 1 person hand held assist Gait Pattern/deviations: Step-to pattern,Decreased dorsiflexion - right,Decreased stride length,Decreased weight shift to right General Gait Details: pt able to take small lateral steps with modA to facilitate wt shift with blocking of R knee to allow for small lateral movement. poor clearance bilaterally with some reliance on heel-toe movement to advance  LLE Gait velocity: decreased   ADL: ADL Overall ADL's : Needs assistance/impaired   Cognition: Cognition Overall Cognitive Status: Within Functional Limits for tasks assessed Orientation Level: Oriented X4 Cognition Arousal/Alertness: Awake/alert Behavior During Therapy: WFL for tasks assessed/performed Overall Cognitive Status: Within Functional Limits for tasks assessed General Comments: pt with generally functional cog through session, followed all commands or expressed inability. benefits from encouragement of use of RUE   Blood pressure (!) 150/95, pulse 80, temperature 99.2 F (37.3 C), temperature source Oral, resp. rate 18, weight 78.4 kg, SpO2 98 %. Physical Exam Vitals and nursing note reviewed. Exam conducted with a chaperone present.  Constitutional:      Appearance: Normal appearance. He is normal weight.     Comments: Awake, alert, appropriate, up in bedside chair, wife, who's a L&D nurse at bedside, NAD  HENT:     Head: Normocephalic and atraumatic.  Comments: R facial droop, but improves with smile Tongue midline    Right Ear: External ear normal.     Left Ear: External ear normal.     Nose: Nose normal. No congestion.     Mouth/Throat:     Mouth: Mucous membranes are dry.     Pharynx: Oropharynx is clear. No oropharyngeal exudate.  Eyes:     General:        Right eye: No discharge.        Left eye: No discharge.     Extraocular Movements: Extraocular movements intact.     Conjunctiva/sclera: Conjunctivae normal.     Comments: No nystagmus B/L  Cardiovascular:     Rate and Rhythm: Normal rate and regular rhythm.     Heart sounds: Normal heart sounds. No murmur heard.    Pulmonary:     Comments: CTA B/L- no W/R/R- good air movement Abdominal:     Comments: Soft, NT, ND, (+)BS   Musculoskeletal:     Cervical back: Normal range of motion. No rigidity.     Comments: LUE- 5/5 in bicep, triceps, WE, grip, and finger abd RUE- biceps 2/5, triceps 2 0/5,  grip 2-/5, finger abd 1/5 LLE- HF/KE/DF and PF 5/5 RLE- HF 3-/5, KE 4/5, DF 4/5, PF 4+/5 Trace R hand swelling, esp in fingers- from weakness  Skin:    Comments: No wounds on heels/extremities L AC fossa IV- blood in dressing L wrist IV- OK  Neurological:     Comments: Patient is alert in no acute distress.  Makes eye contact with examiner.  Oriented x3 and follows commands.  Ox3- appropriate, intact to light touch  In all 4 extremities       Lab Results Last 24 Hours       Results for orders placed or performed during the hospital encounter of 02/16/20 (from the past 24 hour(s))  CBG monitoring, ED     Status: Abnormal    Collection Time: 02/16/20  5:52 PM  Result Value Ref Range    Glucose-Capillary 136 (H) 70 - 99 mg/dL    Comment 1 Notify RN      Comment 2 Document in Chart    Protime-INR     Status: None    Collection Time: 02/16/20  5:54 PM  Result Value Ref Range    Prothrombin Time 12.7 11.4 - 15.2 seconds    INR 1.0 0.8 - 1.2  APTT     Status: Abnormal    Collection Time: 02/16/20  5:54 PM  Result Value Ref Range    aPTT 21 (L) 24 - 36 seconds  CBC     Status: Abnormal    Collection Time: 02/16/20  5:54 PM  Result Value Ref Range    WBC 10.8 (H) 4.0 - 10.5 K/uL    RBC 5.39 4.22 - 5.81 MIL/uL    Hemoglobin 16.0 13.0 - 17.0 g/dL    HCT 48.9 39.0 - 52.0 %    MCV 90.7 80.0 - 100.0 fL    MCH 29.7 26.0 - 34.0 pg    MCHC 32.7 30.0 - 36.0 g/dL    RDW 12.7 11.5 - 15.5 %    Platelets 265 150 - 400 K/uL    nRBC 0.0 0.0 - 0.2 %  Differential     Status: None    Collection Time: 02/16/20  5:54 PM  Result Value Ref Range    Neutrophils Relative % 67 %    Neutro Abs 7.2 1.7 -  7.7 K/uL    Lymphocytes Relative 24 %    Lymphs Abs 2.6 0.7 - 4.0 K/uL    Monocytes Relative 7 %    Monocytes Absolute 0.7 0.1 - 1.0 K/uL    Eosinophils Relative 1 %    Eosinophils Absolute 0.1 0.0 - 0.5 K/uL    Basophils Relative 1 %    Basophils Absolute 0.1 0.0 - 0.1 K/uL    Immature  Granulocytes 0 %    Abs Immature Granulocytes 0.02 0.00 - 0.07 K/uL  Comprehensive metabolic panel     Status: Abnormal    Collection Time: 02/16/20  5:54 PM  Result Value Ref Range    Sodium 137 135 - 145 mmol/L    Potassium 3.9 3.5 - 5.1 mmol/L    Chloride 103 98 - 111 mmol/L    CO2 23 22 - 32 mmol/L    Glucose, Bld 134 (H) 70 - 99 mg/dL    BUN 24 (H) 8 - 23 mg/dL    Creatinine, Ser 1.41 (H) 0.61 - 1.24 mg/dL    Calcium 9.7 8.9 - 10.3 mg/dL    Total Protein 7.8 6.5 - 8.1 g/dL    Albumin 4.4 3.5 - 5.0 g/dL    AST 22 15 - 41 U/L    ALT 21 0 - 44 U/L    Alkaline Phosphatase 56 38 - 126 U/L    Total Bilirubin 0.5 0.3 - 1.2 mg/dL    GFR, Estimated 53 (L) >60 mL/min    Anion gap 11 5 - 15  I-stat chem 8, ED     Status: Abnormal    Collection Time: 02/16/20  6:00 PM  Result Value Ref Range    Sodium 139 135 - 145 mmol/L    Potassium 3.9 3.5 - 5.1 mmol/L    Chloride 105 98 - 111 mmol/L    BUN 29 (H) 8 - 23 mg/dL    Creatinine, Ser 1.30 (H) 0.61 - 1.24 mg/dL    Glucose, Bld 128 (H) 70 - 99 mg/dL    Calcium, Ion 1.07 (L) 1.15 - 1.40 mmol/L    TCO2 24 22 - 32 mmol/L    Hemoglobin 16.3 13.0 - 17.0 g/dL    HCT 48.0 39.0 - 52.0 %  MRSA PCR Screening     Status: None    Collection Time: 02/16/20  8:00 PM    Specimen: Nasopharyngeal  Result Value Ref Range    MRSA by PCR NEGATIVE NEGATIVE  Hemoglobin A1c     Status: None    Collection Time: 02/17/20  3:04 AM  Result Value Ref Range    Hgb A1c MFr Bld 5.5 4.8 - 5.6 %    Mean Plasma Glucose 111.15 mg/dL  Lipid panel     Status: Abnormal    Collection Time: 02/17/20  3:04 AM  Result Value Ref Range    Cholesterol 174 0 - 200 mg/dL    Triglycerides 64 <150 mg/dL    HDL 40 (L) >40 mg/dL    Total CHOL/HDL Ratio 4.4 RATIO    VLDL 13 0 - 40 mg/dL    LDL Cholesterol 121 (H) 0 - 99 mg/dL  Urine rapid drug screen (hosp performed)     Status: None    Collection Time: 02/17/20  7:13 AM  Result Value Ref Range    Opiates NONE DETECTED  NONE DETECTED    Cocaine NONE DETECTED NONE DETECTED    Benzodiazepines NONE DETECTED NONE DETECTED    Amphetamines NONE DETECTED  NONE DETECTED    Tetrahydrocannabinol NONE DETECTED NONE DETECTED    Barbiturates NONE DETECTED NONE DETECTED       Imaging Results (Last 48 hours)  MR BRAIN WO CONTRAST   Result Date: 02/17/2020 CLINICAL DATA:  Follow-up stroke 24 hours post tPA. Right-sided weakness. EXAM: MRI HEAD WITHOUT CONTRAST TECHNIQUE: Multiplanar, multiecho pulse sequences of the brain and surrounding structures were obtained without intravenous contrast. COMPARISON:  CT studies done yesterday. FINDINGS: Brain: Diffusion imaging shows acute infarction in the left basal ganglia and radiating white matter tracts. No other acute insult. The brainstem and cerebellum are normal. Cerebral hemispheres elsewhere show few old small vessel infarctions of the white matter and of the right basal ganglia. No large vessel territory infarction. No mass lesion, hemorrhage, hydrocephalus or extra-axial collection. Vascular: Major vessels at the base of the brain show flow. Skull and upper cervical spine: Negative Sinuses/Orbits: Clear/normal Other: None IMPRESSION: 1. Acute infarction in the left basal ganglia and radiating white matter tracts. No mass effect or hemorrhage. 2. Mild chronic small-vessel ischemic changes elsewhere affecting the cerebral hemispheric white matter and right basal ganglia. Electronically Signed   By: Nelson Chimes M.D.   On: 02/17/2020 12:52    CT HEAD CODE STROKE WO CONTRAST   Result Date: 02/16/2020 CLINICAL DATA:  Code stroke.  Acute neuro deficit EXAM: CT HEAD WITHOUT CONTRAST TECHNIQUE: Contiguous axial images were obtained from the base of the skull through the vertex without intravenous contrast. COMPARISON:  None. FINDINGS: Brain: Well-defined hypodensity in the right corona radiata compatible with chronic white matter infarct. Negative for acute infarct, hemorrhage, or mass.  Ventricle size normal. Vascular: Negative for hyperdense vessel. Atherosclerotic calcification in the carotid and vertebral arteries. Skull: Negative Sinuses/Orbits: Paranasal sinuses clear.  Negative orbit Other: None ASPECTS ([place] Stroke Program Wigger CT Score) - Ganglionic level infarction (caudate, lentiform nuclei, internal capsule, insula, M1-M3 cortex): 7 - Supraganglionic infarction (M4-M6 cortex): 3 Total score (0-10 with 10 being normal): 10 IMPRESSION: 1. No acute abnormality identified 2. ASPECTS is 10 3. Chronic infarct in the right corona radiata. 4. Code stroke imaging results were communicated on 02/16/2020 at 6:06 pm to provider Bhagat via text page Electronically Signed   By: Franchot Gallo M.D.   On: 02/16/2020 18:07    CT ANGIO HEAD CODE STROKE   Result Date: 02/16/2020 CLINICAL DATA:  Acute neuro deficit. EXAM: CT ANGIOGRAPHY HEAD AND NECK TECHNIQUE: Multidetector CT imaging of the head and neck was performed using the standard protocol during bolus administration of intravenous contrast. Multiplanar CT image reconstructions and MIPs were obtained to evaluate the vascular anatomy. Carotid stenosis measurements (when applicable) are obtained utilizing NASCET criteria, using the distal internal carotid diameter as the denominator. CONTRAST:  30m OMNIPAQUE IOHEXOL 350 MG/ML SOLN COMPARISON:  CT head 02/16/2020 FINDINGS: CTA NECK FINDINGS Aortic arch: Standard branching. Imaged portion shows no evidence of aneurysm or dissection. No significant stenosis of the major arch vessel origins. Right carotid system: Mild atherosclerotic disease right carotid bifurcation without stenosis. Left carotid system: Mild atherosclerotic disease left carotid bifurcation without stenosis. Vertebral arteries: Both vertebral arteries are patent to the basilar without stenosis. Skeleton: Cervical spondylosis.  No acute skeletal abnormality. Other neck: Negative for mass or adenopathy. 8 mm calcification right  thyroid nodule. No further imaging necessary. Upper chest: Lung apices clear bilaterally. Review of the MIP images confirms the above findings CTA HEAD FINDINGS Anterior circulation: Mild atherosclerotic disease in the cavernous carotid bilaterally without stenosis. Anterior and middle  cerebral arteries patent bilaterally without large vessel occlusion. Mild atherosclerotic irregularity in the anterior and middle cerebral arteries bilaterally without significant stenosis. Posterior circulation: Both vertebral arteries patent to the basilar. PICA patent bilaterally. Basilar widely patent. Superior cerebellar arteries patent bilaterally. Moderate stenosis P2 segment bilaterally. No large vessel occlusion Venous sinuses: Normal venous enhancement. Anatomic variants: None Review of the MIP images confirms the above findings IMPRESSION: 1. Negative for intracranial large vessel occlusion 2. Intracranial atherosclerotic disease with moderate stenosis in the P2 segment bilaterally. Mild atherosclerotic irregularity in the anterior and middle cerebral arteries. 3. No significant carotid or vertebral artery stenosis in the neck. Electronically Signed   By: Franchot Gallo M.D.   On: 02/16/2020 18:41    CT ANGIO NECK CODE STROKE   Result Date: 02/16/2020 CLINICAL DATA:  Acute neuro deficit. EXAM: CT ANGIOGRAPHY HEAD AND NECK TECHNIQUE: Multidetector CT imaging of the head and neck was performed using the standard protocol during bolus administration of intravenous contrast. Multiplanar CT image reconstructions and MIPs were obtained to evaluate the vascular anatomy. Carotid stenosis measurements (when applicable) are obtained utilizing NASCET criteria, using the distal internal carotid diameter as the denominator. CONTRAST:  42m OMNIPAQUE IOHEXOL 350 MG/ML SOLN COMPARISON:  CT head 02/16/2020 FINDINGS: CTA NECK FINDINGS Aortic arch: Standard branching. Imaged portion shows no evidence of aneurysm or dissection. No  significant stenosis of the major arch vessel origins. Right carotid system: Mild atherosclerotic disease right carotid bifurcation without stenosis. Left carotid system: Mild atherosclerotic disease left carotid bifurcation without stenosis. Vertebral arteries: Both vertebral arteries are patent to the basilar without stenosis. Skeleton: Cervical spondylosis.  No acute skeletal abnormality. Other neck: Negative for mass or adenopathy. 8 mm calcification right thyroid nodule. No further imaging necessary. Upper chest: Lung apices clear bilaterally. Review of the MIP images confirms the above findings CTA HEAD FINDINGS Anterior circulation: Mild atherosclerotic disease in the cavernous carotid bilaterally without stenosis. Anterior and middle cerebral arteries patent bilaterally without large vessel occlusion. Mild atherosclerotic irregularity in the anterior and middle cerebral arteries bilaterally without significant stenosis. Posterior circulation: Both vertebral arteries patent to the basilar. PICA patent bilaterally. Basilar widely patent. Superior cerebellar arteries patent bilaterally. Moderate stenosis P2 segment bilaterally. No large vessel occlusion Venous sinuses: Normal venous enhancement. Anatomic variants: None Review of the MIP images confirms the above findings IMPRESSION: 1. Negative for intracranial large vessel occlusion 2. Intracranial atherosclerotic disease with moderate stenosis in the P2 segment bilaterally. Mild atherosclerotic irregularity in the anterior and middle cerebral arteries. 3. No significant carotid or vertebral artery stenosis in the neck. Electronically Signed   By: CFranchot GalloM.D.   On: 02/16/2020 18:41         Assessment/Plan: Diagnosis: L basal ganglia stroke with R dense hemiparesis 1. Does the need for close, 24 hr/day medical supervision in concert with the patient's rehab needs make it unreasonable for this patient to be served in a less intensive setting?  Yes 2. Co-Morbidities requiring supervision/potential complications: HTN, HLD, DM, R hand swelling- mild constipation 3. Due to bowel management, safety, skin/wound care, disease management, medication administration and patient education, does the patient require 24 hr/day rehab nursing? Yes 4. Does the patient require coordinated care of a physician, rehab nurse, therapy disciplines of PT and OT to address physical and functional deficits in the context of the above medical diagnosis(es)? Yes Addressing deficits in the following areas: balance, endurance, locomotion, strength, transferring, bathing, dressing, feeding, grooming and toileting 5. Can the patient actively participate  in an intensive therapy program of at least 3 hrs of therapy per day at least 5 days per week? Yes 6. The potential for patient to make measurable gains while on inpatient rehab is good 7. Anticipated functional outcomes upon discharge from inpatient rehab are modified independent, supervision and min assist  with PT, modified independent, supervision and min assist with OT, n/a with SLP. 8. Estimated rehab length of stay to reach the above functional goals is: 10-14 days 9. Anticipated discharge destination: Home 10. Overall Rehab/Functional Prognosis: good   RECOMMENDATIONS: This patient's condition is appropriate for continued rehabilitative care in the following setting: CIR Patient has agreed to participate in recommended program. Potentially Note that insurance prior authorization may be required for reimbursement for recommended care.   Comment:  1. Pt's LBM was Sunday- if no BM by tomorrow, might need intervention 2. Will submit for inpt Rehab/CIR- pt is a good candidate for inpt rehab.  3. Will d/w admissions coordinators 4. Thank you for this consult     Cathlyn Parsons, PA-C 02/17/2020

## 2020-02-21 NOTE — Progress Notes (Addendum)
Inpatient Rehab Admissions Coordinator:   I have insurance approval and a bed available for pt to admit to CIR today.  Awaiting confirmation from acute MD that he is ready.  Will let TOC/family know once I hear from MD.    Addendum: I have approval from Dr. Leonie Man for pt to admit today.   Shann Medal, PT, DPT Admissions Coordinator [phone] 02/21/20  10:18 AM

## 2020-02-21 NOTE — Progress Notes (Signed)
Physical Therapy Treatment Patient Details Name: Mattew Chriswell Sr. MRN: [id number] DOB: 04-17-1950 Today's Date: 02/21/2020    History of Present Illness The pt is a 70 yo male presenting with R-sided arm and leg weakness and R-sided facial droop. CT revealed no acute infarct, awaiting MRI, suspected L-sided lacunar infarct. tPA given 1810 1/30. PMH includes: HTN, HLD, DM, gout, and former smoker.    PT Comments    Pt tolerates treatment well with improved ambulation tolerance and increased gait speed. Pt continues to demonstrate R sided weakness and poor neuromuscular endurance which results in gait deviations including foot drag, increasing the pt's falls risk. Pt will benefit from continued aggressive mobilization and acute PT services to improve mobility quality and to aide in a return to independent mobility. PT continues to recommend CIR placement at this time.  Follow Up Recommendations  CIR     Equipment Recommendations  3in1 (PT);Wheelchair (measurements PT) (R platform walker)    Recommendations for Other Services       Precautions / Restrictions Precautions Precautions: Fall Precaution Comments: R-sided weakness Restrictions Weight Bearing Restrictions: No    Mobility  Bed Mobility Overal bed mobility: Needs Assistance Bed Mobility: Supine to Sit     Supine to sit: Supervision;HOB elevated     General bed mobility comments: use of bed rails and increased time  Transfers Overall transfer level: Needs assistance Equipment used: Right platform walker;None Transfers: Sit to/from Stand Sit to Stand: Min assist;Min guard         General transfer comment: minA from bed with platform walker, minG from recliner with use of armrest  Ambulation/Gait Ambulation/Gait assistance: Min assist Gait Distance (Feet): 125 Feet Assistive device: Right platform walker Gait Pattern/deviations: Step-through pattern Gait velocity: reduced Gait velocity interpretation: <1.8  ft/sec, indicate of risk for recurrent falls General Gait Details: pt with step-to gait initially, PT encourages a more fluid step-through gait pattern which pt is able to achieve. With fatigue pt demonstrates R foot drag and multiple losses of balance due to catching his foot on ground or narrowed BOS   Stairs             Wheelchair Mobility    Modified Rankin (Stroke Patients Only) Modified Rankin (Stroke Patients Only) Pre-Morbid Rankin Score: No symptoms Modified Rankin: Moderately severe disability     Balance Overall balance assessment: Needs assistance Sitting-balance support: Feet supported;Single extremity supported Sitting balance-Leahy Scale: Poor Sitting balance - Comments: reliant on RUE support of railing   Standing balance support: No upper extremity supported Standing balance-Leahy Scale: Fair Standing balance comment: close supervision for brief periods at end of session                            Cognition Arousal/Alertness: Awake/alert Behavior During Therapy: WFL for tasks assessed/performed Overall Cognitive Status: Within Functional Limits for tasks assessed                                        Exercises Other Exercises Other Exercises: 5x sit to stand: 32 seconds with close supervision    General Comments General comments (skin integrity, edema, etc.): VSS on RA      Pertinent Vitals/Pain Pain Assessment: Faces Faces Pain Scale: Hurts little more Pain Location: R wrist Pain Descriptors / Indicators: Sore Pain Intervention(s): Ice applied    Home Living  Prior Function            PT Goals (current goals can now be found in the care plan section) Acute Rehab PT Goals Patient Stated Goal: get to rehab and return home independent\ Progress towards PT goals: Progressing toward goals    Frequency    Min 4X/week      PT Plan Current plan remains appropriate     Co-evaluation              AM-PAC PT "6 Clicks" Mobility   Outcome Measure  Help needed turning from your back to your side while in a flat bed without using bedrails?: A Little Help needed moving from lying on your back to sitting on the side of a flat bed without using bedrails?: A Little Help needed moving to and from a bed to a chair (including a wheelchair)?: A Little Help needed standing up from a chair using your arms (e.g., wheelchair or bedside chair)?: A Little Help needed to walk in hospital room?: A Little Help needed climbing 3-5 steps with a railing? : Total 6 Click Score: 16    End of Session Equipment Utilized During Treatment: Gait belt Activity Tolerance: Patient tolerated treatment well Patient left: in chair;with call bell/phone within reach;with chair alarm set;with family/visitor present Nurse Communication: Mobility status PT Visit Diagnosis: Hemiplegia and hemiparesis;Difficulty in walking, not elsewhere classified (R26.2) Hemiplegia - Right/Left: Right Hemiplegia - dominant/non-dominant: Dominant Hemiplegia - caused by: Cerebral infarction     Time: 0840-0903 PT Time Calculation (min) (ACUTE ONLY): 23 min  Charges:  $Gait Training: 8-22 mins $Therapeutic Activity: 8-22 mins                     Zenaida Niece, PT, DPT Acute Rehabilitation Pager: [phone]    Zenaida Niece 02/21/2020, 10:02 AM

## 2020-02-21 NOTE — Plan of Care (Signed)

## 2020-02-21 NOTE — H&P (Signed)
Physical Medicine and Rehabilitation Admission H&P     HPI: Brent Mccormick, Brent Mccormick. is a 70 year old right-handed male with history of gout, type 2 diabetes mellitus, hypertension, hyperlipidemia, quit smoking 54 years ago.  History taken from chart review and patient.  Patient lives with spouse.  Wife is an Therapist, sports.  1 level home 5 steps to entry.  Independent prior to admission and active.  He presented on 02/16/2020 with right hemiparesis and facial droop.  Head CT unremarkable for acute intracranial process.  Chronic infarct right corona radiata.  CT angiogram of head and neck negative for large vessel occlusion.  Patient did receive TPA.  MRI showed acute infarction left basal ganglia and radiating white matter tracts.  No mass-effect or hemorrhage.  Echocardiogram with ejection fraction of 60-65%, no wall motion abnormalities grade 1 diastolic dysfunction.  Admission chemistries unremarkable except BUN 24 creatinine 1.41, WBC 10,800.  Neurology consulted maintain on aspirin 81 mg daily and Plavix 75 mg daily x3 weeks then Plavix alone.  Patient did undergo a loop recorder placement per cardiology services 02/19/2020.  Lovenox for DVT prophylaxis.  Therapy evaluations completed due to patient's right hemiparesis, he was admitted for a comprehensive rehab program.  Please see preadmission assessment from earlier today as well.  Review of Systems  Constitutional: Positive for malaise/fatigue. Negative for chills and fever.  HENT: Negative for hearing loss.   Eyes: Negative for blurred vision and double vision.  Respiratory: Negative for cough and shortness of breath.   Cardiovascular: Negative for chest pain, palpitations and leg swelling.  Gastrointestinal: Positive for constipation. Negative for heartburn, nausea and vomiting.  Genitourinary: Negative for dysuria, flank pain and hematuria.  Musculoskeletal: Positive for joint pain and myalgias.  Skin: Negative for rash.  Neurological: Positive for  focal weakness and weakness. Negative for sensory change and speech change.  All other systems reviewed and are negative.  Past Medical History:  Diagnosis Date  . Carpal tunnel syndrome   . Gout   . Hyperlipidemia    on meds  . Hypertension    on meds   Past Surgical History:  Procedure Laterality Date  . COLONOSCOPY  2015   DJ-MAC-polyps  . LOOP RECORDER INSERTION N/A 02/19/2020   Procedure: LOOP RECORDER INSERTION;  Surgeon: Constance Haw, MD;  Location: [facility];  Service: Cardiovascular;  Laterality: N/A;  . no prior surgery    . POLYPECTOMY  2015   DJ-MAC-polyps   Family History  Problem Relation Age of Onset  . Ovarian cancer Mother   . Heart disease Father   . Hypertension Father   . Colon polyps Sister 68  . Colon cancer Neg Hx   . Esophageal cancer Neg Hx   . Rectal cancer Neg Hx   . Stomach cancer Neg Hx    Social History:  reports that he quit smoking about 54 years ago. He has never used smokeless tobacco. He reports current alcohol use. He reports that he does not use drugs. Allergies: No Known Allergies Facility-Administered Medications Prior to Admission  Medication Dose Route Frequency Provider Last Rate Last Admin  . triamcinolone acetonide (KENALOG) 10 MG/ML injection 10 mg  10 mg Other Once Landis Martins, DPM       Medications Prior to Admission  Medication Sig Dispense Refill  . Ascorbic Acid (VITAMIN C) 1000 MG tablet Take 1,000 mg by mouth daily.    Marland Kitchen aspirin 81 MG tablet Take 81 mg by mouth daily.    Marland Kitchen  cholecalciferol (VITAMIN D3) 25 MCG (1000 UNIT) tablet Take 1,000 Units by mouth daily.    . colchicine 0.6 MG tablet Take 1 tablet (0.6 mg total) by mouth every other day. 45 tablet 3  . losartan (COZAAR) 100 MG tablet Take 1 tablet (100 mg total) by mouth daily. 90 tablet 3  . Multiple Vitamin (MULTIVITAMIN) tablet Take 1 tablet by mouth daily.    . rosuvastatin (CRESTOR) 5 MG tablet Take 1 tablet (5 mg total) by mouth at  bedtime. 90 tablet 3  . zinc gluconate 50 MG tablet Take 50 mg by mouth daily.      Drug Regimen Review Drug regimen was reviewed and remains appropriate with no significant issues identified  Home: Home Living Family/patient expects to be discharged to:: Private residence Living Arrangements: Spouse/significant other,Children Available Help at Discharge: Family,Available 24 hours/day Type of Home: House Home Access: Stairs to enter [employer] of Steps: 5 or 8 Entrance Stairs-Rails: Can reach both Home Layout: One level Bathroom Shower/Tub: Multimedia programmer: Standard Home Equipment: Shower seat - built in,Grab bars - tub/shower,Crutches,Hand held shower head Additional Comments: son that is 45 yo that works but lives in the home. 2 cats inside and 2 outside dogs  Lives With: Spouse   Functional History: Prior Function Level of Independence: Independent Comments: works from [employer]. enjoys reading historical non-fiction. Wife is RN at [employer], can be with pt 24/7 tues-fri and other family can be available to cover other days  Functional Status:  Mobility: Bed Mobility Overal bed mobility: Needs Assistance Bed Mobility: Supine to Sit Rolling: Min assist Sidelying to sit: Mod assist Supine to sit: Supervision,HOB elevated Sit to supine: Mod assist Sit to sidelying: Mod assist General bed mobility comments: use of bed rails and increased time Transfers Overall transfer level: Needs assistance Equipment used: Right platform walker,None Transfers: Sit to/from Stand Sit to Stand: Min assist,Min guard Stand pivot transfers: Mod assist General transfer comment: minA from bed with platform walker, minG from recliner with use of armrest Ambulation/Gait Ambulation/Gait assistance: Min assist Gait Distance (Feet): 125 Feet Assistive device: Right platform walker Gait Pattern/deviations: Step-through pattern General Gait Details: pt with  step-to gait initially, PT encourages a more fluid step-through gait pattern which pt is able to achieve. With fatigue pt demonstrates R foot drag and multiple losses of balance due to catching his foot on ground or narrowed BOS Gait velocity: reduced Gait velocity interpretation: <1.8 ft/sec, indicate of risk for recurrent falls    ADL: ADL Overall ADL's : Needs assistance/impaired Eating/Feeding: Moderate assistance,Bed level Eating/Feeding Details (indicate cue type and reason): positioned upright with tray positioned to help with self feeding Grooming: Maximal assistance,Sitting Toilet Transfer: Maximal assistance Toilet Transfer Details (indicate cue type and reason): pt transferred from bed to toilet with R LE blocked. pt needed step by step sequence to progress Functional mobility during ADLs: Maximal assistance General ADL Comments: pt motivated to transfer to the bathroom this session. wife present during session and very helpful with line management. Pt with R inattention  Cognition: Cognition Overall Cognitive Status: Within Functional Limits for tasks assessed Arousal/Alertness: Awake/alert Orientation Level: Oriented X4 Attention: Sustained Sustained Attention: Appears intact Memory: Impaired Memory Impairment: Retrieval deficit Awareness: Appears intact Problem Solving: Appears intact Safety/Judgment: Appears intact Cognition Arousal/Alertness: Awake/alert Behavior During Therapy: WFL for tasks assessed/performed Overall Cognitive Status: Within Functional Limits for tasks assessed Area of Impairment: Awareness Memory: Decreased short-term memory Awareness: Emergent General Comments: pt with generally functional cog  through session, followed all commands or expressed inability. benefits from encouragement of use of RUE  Physical Exam: Blood pressure 126/84, pulse (!) 116, temperature 98 F (36.7 C), temperature source Oral, resp. rate 18, weight 78.4 kg, SpO2 98  %. Physical Exam Vitals reviewed.  Constitutional:      General: He is not in acute distress.    Appearance: He is normal weight. He is not ill-appearing.  HENT:     Head: Normocephalic and atraumatic.     Right Ear: External ear normal.     Left Ear: External ear normal.     Nose: Nose normal.  Eyes:     General:        Right eye: No discharge.        Left eye: No discharge.     Extraocular Movements: Extraocular movements intact.  Cardiovascular:     Rate and Rhythm: Regular rhythm. Tachycardia present.  Pulmonary:     Effort: Pulmonary effort is normal. No respiratory distress.     Breath sounds: No stridor.  Abdominal:     General: Abdomen is flat. Bowel sounds are normal. There is no distension.  Musculoskeletal:     Cervical back: Normal range of motion and neck supple.     Comments: Right wrist with tenderness No edema  Skin:    General: Skin is warm and dry.  Neurological:     Mental Status: He is alert.     Comments: Alert Makes good eye contact with examiner.   Follows commands.   Oriented to person place and time.   Fair insight and awareness of deficits. Motor: LUE/LLE: 5/5 proximal distal RUE: 3+/5 proximal to distal RLE: 4+/5 proximal to distal  Psychiatric:        Mood and Affect: Mood normal.        Behavior: Behavior normal.        Thought Content: Thought content normal.     Results for orders placed or performed during the hospital encounter of 02/16/20 (from the past 48 hour(s))  Basic metabolic panel     Status: Abnormal   Collection Time: 02/20/20  3:57 AM  Result Value Ref Range   Sodium 136 135 - 145 mmol/L   Potassium 3.5 3.5 - 5.1 mmol/L   Chloride 102 98 - 111 mmol/L   CO2 23 22 - 32 mmol/L   Glucose, Bld 122 (H) 70 - 99 mg/dL    Comment: Glucose reference range applies only to samples taken after fasting for at least 8 hours.   BUN 15 8 - 23 mg/dL   Creatinine, Ser 1.12 0.61 - 1.24 mg/dL   Calcium 8.9 8.9 - 10.3 mg/dL   GFR,  Estimated >60 >60 mL/min    Comment: (NOTE) Calculated using the CKD-EPI Creatinine Equation (2021)    Anion gap 11 5 - 15    Comment: Performed at [facility] [address], [place], [place] [address]  Basic metabolic panel     Status: Abnormal   Collection Time: 02/21/20  5:11 AM  Result Value Ref Range   Sodium 138 135 - 145 mmol/L   Potassium 3.8 3.5 - 5.1 mmol/L   Chloride 104 98 - 111 mmol/L   CO2 20 (L) 22 - 32 mmol/L   Glucose, Bld 117 (H) 70 - 99 mg/dL    Comment: Glucose reference range applies only to samples taken after fasting for at least 8 hours.   BUN 24 (H) 8 - 23 mg/dL  Creatinine, Ser 1.11 0.61 - 1.24 mg/dL   Calcium 8.9 8.9 - 10.3 mg/dL   GFR, Estimated >60 >60 mL/min    Comment: (NOTE) Calculated using the CKD-EPI Creatinine Equation (2021)    Anion gap 14 5 - 15    Comment: Performed at [facility] [address] [place], [place] [address]   No results found.     Medical Problem List and Plan: 1.  Right hemiparesis secondary to ischemic infarct left basal ganglia likely due to small vessel disease.  Status post loop recorder  -patient may shower  -ELOS/Goals: 10-14 days/supervision/mod I  Admit to CIR 2.  Antithrombotics: -DVT/anticoagulation: Lovenox  -antiplatelet therapy: Aspirin 81 mg daily and Plavix 75 mg daily x3 weeks then Plavix alone 3. Pain Management: Tylenol as needed 4. Mood: Provide emotional support  -antipsychotic agents: N/A 5. Neuropsych: This patient is capable of making decisions on his own behalf. 6. Skin/Wound Care: Routine skin checks 7. Fluids/Electrolytes/Nutrition: Routine in and outs  CMP ordered for tomorrow 8.  Hyperlipidemia: Crestor 9.  History of gout.  Colchicine 0.6 mg every other day.    Monitor for flares 10.  Essential hypertension.  Cozaar 100 mg daily  Monitor with increased mobility 12.  Tachycardia-likely secondary to deconditioning  ECG on 2/4 reviewed-normal 15.   Leukocytosis  WBCs 10.9 on 2/1, CBC ordered  Afebrile, no signs/symptoms of infection   Cathlyn Parsons, PA-C 02/21/2020  I have personally performed a face to face diagnostic evaluation, including, but not limited to relevant history and physical exam findings, of this patient and developed relevant assessment and plan.  Additionally, I have reviewed and concur with the physician assistant's documentation above.  Delice Lesch, MD, ABPMR  The patient's status has not changed. Any changes from the pre-admission screening or documentation from the acute chart are noted above.   Delice Lesch, MD, ABPMR

## 2020-02-21 NOTE — Progress Notes (Signed)
PMR Admission Coordinator Pre-Admission Assessment   Patient: Brent Choe. is an 70 y.o., male MRN: [id number] DOB: Aug 31, 1950 Height:   Weight: 78.4 kg                                                                                                                                                  Insurance Information HMO:     PPO: yes     PCP:      IPA:      80/20: medical   OTHER:  PRIMARYIval Bible      Policy#: [id number]      Subscriber: Brent Mccormick (spouse) CM Name: Brent Mccormick      Phone#: [phone] ext [phone]     Fax#: [phone] Pre-Cert#: [id number] auth for CIR via fax from [facility] with updates due to fax listed above on 02/28/2020.       Employer:  Benefits:  Phone #:      Name:  Eff. Date: 01/18/2020     Deduct: $300      Out of Pocket Max: $7900 ($0 met)      Life Max: n/a  CIR: $300/admission, then 80% coverage      SNF: 80% Outpatient: 80%     Co-Ins: 20% Home Health: 80%      Co-Ins: 20% DME: 80%     Co-Pay: 20% Providers: preferred network   SECONDARY: Medicare A      Policy#: [id number]      Phone#:    Development worker, community:       Phone#:    The Engineer, [employer]" for patients in Inpatient Rehabilitation Facilities with attached "Privacy Act [place] Records" was provided and verbally reviewed with: Patient and Family   Emergency Contact Information         Contact Information     Name Relation Home Work Mobile    Brent Mccormick Spouse [phone]   [phone]       Current Medical History  Patient Admitting Diagnosis: CVA    History of Present Illness: Brent Mccormick, Brent Mccormick. is a 70 year old right-handed male with history of gout, type 2 diabetes mellitus, hypertension, hyperlipidemia, quit smoking 54 years ago.  Presented 02/16/2020 with right side weakness and facial droop.  Cranial CT scan negative for acute changes.  Chronic infarct right corona radiata.  CT angiogram of head and neck negative for large vessel occlusion.   Patient did receive TPA.  MRI showed acute infarction left basal ganglia and radiating white matter tracts.  No mass-effect or hemorrhage.  Echocardiogram with ejection fraction of 60 to 65% no wall motion abnormalities grade 1 diastolic dysfunction.  Admission chemistries unremarkable except BUN 24 creatinine 1.41 WBC 10,800.  Neurology consulted maintain on aspirin 81 mg daily and Plavix 75 mg daily x3 weeks then Plavix alone.  Patient did undergo a loop recorder placement per  cardiology services 02/19/2020.  Lovenox for DVT prophylaxis.  Therapy evaluations completed due to patient's right side weakness he was recommended for a comprehensive rehab program.   Complete NIHSS TOTAL: 3 Glasgow Coma Scale Score: 15   Past Medical History      Past Medical History:  Diagnosis Date  . Carpal tunnel syndrome    . Gout    . Hyperlipidemia      on meds  . Hypertension      on meds      Family History  family history includes Colon polyps (age of onset: 15) in his sister; Heart disease in his father; Hypertension in his father; Ovarian cancer in his mother.   Prior Rehab/Hospitalizations:  Has the patient had prior rehab or hospitalizations prior to admission? No   Has the patient had major surgery during 100 days prior to admission? Yes   Current Medications    Current Facility-Administered Medications:  .  acetaminophen (TYLENOL) tablet 650 mg, 650 mg, Oral, Q4H PRN, 650 mg at 02/21/20 1014 **OR** acetaminophen (TYLENOL) 160 MG/5ML solution 650 mg, 650 mg, Per Tube, Q4H PRN **OR** acetaminophen (TYLENOL) suppository 650 mg, 650 mg, Rectal, Q4H PRN, Bailey-Modzik, Delila A, NP .  aspirin EC tablet 81 mg, 81 mg, Oral, Daily, Bailey-Modzik, Delila A, NP, 81 mg at 02/21/20 1001 .  Chlorhexidine Gluconate Cloth 2 % PADS 6 each, 6 each, Topical, Daily, Bailey-Modzik, Delila A, NP, 6 each at 02/21/20 1002 .  cholecalciferol (VITAMIN D3) tablet 1,000 Units, 1,000 Units, Oral, Daily, Bailey-Modzik,  Delila A, NP, 1,000 Units at 02/21/20 1002 .  clopidogrel (PLAVIX) tablet 75 mg, 75 mg, Oral, Daily, Bailey-Modzik, Delila A, NP, 75 mg at 02/21/20 1002 .  colchicine tablet 0.6 mg, 0.6 mg, Oral, QODAY, Bailey-Modzik, Delila A, NP, 0.6 mg at 02/21/20 1001 .  enoxaparin (LOVENOX) injection 40 mg, 40 mg, Subcutaneous, Daily, Garvin Fila, MD, 40 mg at 02/21/20 1002 .  influenza vaccine adjuvanted (FLUAD) injection 0.5 mL, 0.5 mL, Intramuscular, Tomorrow-1000, Bhagat, Srishti L, MD .  labetalol (NORMODYNE) injection 10 mg, 10 mg, Intravenous, Q10 min PRN, Bailey-Modzik, Delila A, NP, 10 mg at 02/19/20 1400 .  losartan (COZAAR) tablet 100 mg, 100 mg, Oral, Daily, Antony Contras S, MD, 100 mg at 02/21/20 1001 .  pantoprazole (PROTONIX) EC tablet 40 mg, 40 mg, Oral, QHS, Bhagat, Srishti L, MD, 40 mg at 02/20/20 2236 .  rosuvastatin (CRESTOR) tablet 20 mg, 20 mg, Oral, Daily, Bailey-Modzik, Delila A, NP, 20 mg at 02/21/20 1001 .  senna-docusate (Senokot-S) tablet 1 tablet, 1 tablet, Oral, BID, Donnetta Simpers, MD, 1 tablet at 02/21/20 1002 .  sodium chloride flush (NS) 0.9 % injection 3 mL, 3 mL, Intravenous, Once, Bailey-Modzik, Delila A, NP   Patients Current Diet:     Diet Order                      Diet Heart Room service appropriate? Yes with Assist; Fluid consistency: Thin  Diet effective now                      Precautions / Restrictions Precautions Precautions: Fall Precaution Comments: R-sided weakness Restrictions Weight Bearing Restrictions: No    Has the patient had 2 or more falls or a fall with injury in the past year?No   Prior Activity Level Community (5-7x/wk): was working full time Fish farm manager, driving, no DME at baseline   Prior Functional Level Prior Function Level of  Independence: Independent Comments: works from Chemical engineer. enjoys reading historical non-fiction. Wife is RN at [employer], can be with pt 24/7 tues-fri and other family can  be available to cover other days   Self Care: Did the patient need help bathing, dressing, using the toilet or eating?  Independent   Indoor Mobility: Did the patient need assistance with walking from room to room (with or without device)? Independent   Stairs: Did the patient need assistance with internal or external stairs (with or without device)? Independent   Functional Cognition: Did the patient need help planning regular tasks such as shopping or remembering to take medications? Independent   Home Assistive Devices / Equipment Home Equipment: Shower seat - built in,Grab bars - tub/shower,Crutches,Hand held shower head   Prior Device Use: Indicate devices/aids used by the patient prior to current illness, exacerbation or injury? None of the above   Current Functional Level Cognition   Arousal/Alertness: Awake/alert Overall Cognitive Status: Within Functional Limits for tasks assessed Orientation Level: Oriented X4 General Comments: pt with generally functional cog through session, followed all commands or expressed inability. benefits from encouragement of use of RUE Attention: Sustained Sustained Attention: Appears intact Memory: Impaired Memory Impairment: Retrieval deficit Awareness: Appears intact Problem Solving: Appears intact Safety/Judgment: Appears intact    Extremity Assessment (includes Sensation/Coordination)   Upper Extremity Assessment: RUE deficits/detail RUE Deficits / Details: able to complete digit flexion but lacks digits extension, adduction of thumb present, decreased bicep but tricep 3 out 5 against gravity. able to shrug shoulders in scapula elevation RUE Sensation: decreased light touch RUE Coordination: decreased fine motor,decreased gross motor  Lower Extremity Assessment: Defer to PT evaluation RLE Deficits / Details: pt reports intact sensation. 3+/5 at ankle DF, 3/5 knee extension, 3/5 after multiple attempts knee flexion RLE Sensation: WNL (pt  denies difference in sensation) RLE Coordination: WNL     ADLs   Overall ADL's : Needs assistance/impaired Eating/Feeding: Moderate assistance,Bed level Eating/Feeding Details (indicate cue type and reason): positioned upright with tray positioned to help with self feeding Grooming: Maximal assistance,Sitting Toilet Transfer: Maximal assistance Toilet Transfer Details (indicate cue type and reason): pt transferred from bed to toilet with R LE blocked. pt needed step by step sequence to progress Functional mobility during ADLs: Maximal assistance General ADL Comments: pt motivated to transfer to the bathroom this session. wife present during session and very helpful with line management. Pt with R inattention     Mobility   Overal bed mobility: Needs Assistance Bed Mobility: Supine to Sit Rolling: Min assist Sidelying to sit: Mod assist Supine to sit: Supervision,HOB elevated Sit to supine: Mod assist Sit to sidelying: Mod assist General bed mobility comments: use of bed rails and increased time     Transfers   Overall transfer level: Needs assistance Equipment used: Right platform walker,None Transfers: Sit to/from Stand Sit to Stand: Min assist,Min guard Stand pivot transfers: Mod assist General transfer comment: minA from bed with platform walker, minG from recliner with use of armrest     Ambulation / Gait / Stairs / Wheelchair Mobility   Ambulation/Gait Ambulation/Gait assistance: Herbalist (Feet): 125 Feet Assistive device: Right platform walker Gait Pattern/deviations: Step-through pattern General Gait Details: pt with step-to gait initially, PT encourages a more fluid step-through gait pattern which pt is able to achieve. With fatigue pt demonstrates R foot drag and multiple losses of balance due to catching his foot on ground or narrowed BOS Gait velocity: reduced Gait velocity interpretation: <  1.8 ft/sec, indicate of risk for recurrent falls      Posture / Balance Dynamic Sitting Balance Sitting balance - Comments: reliant on RUE support of railing Balance Overall balance assessment: Needs assistance Sitting-balance support: Feet supported,Single extremity supported Sitting balance-Leahy Scale: Poor Sitting balance - Comments: reliant on RUE support of railing Standing balance support: No upper extremity supported Standing balance-Leahy Scale: Fair Standing balance comment: close supervision for brief periods at end of session     Special needs/care consideration Designated visitor Brent Mccormick        Previous Home Environment (from acute therapy documentation) Living Arrangements: Spouse/significant other,Children  Lives With: Spouse Available Help at Discharge: Family,Available 24 hours/day Type of Home: House Home Layout: One level Home Access: Stairs to enter Entrance Stairs-Rails: Can reach both Entrance Stairs-Number of Steps: 5 or 8 Bathroom Shower/Tub: Multimedia programmer: Standard Home Care Services: No Additional Comments: son that is 36 yo that works but lives in the home. 2 cats inside and 2 outside dogs   Discharge Living Setting Plans for Discharge Living Setting: Patient's home Type of Home at Discharge: House Discharge Home Layout: One level Discharge Home Access: Stairs to enter (side door, primary entrance) Entrance Stairs-Rails: Can reach both Entrance Stairs-Number of Steps: 8 Discharge Bathroom Shower/Tub: Walk-in shower Discharge Bathroom Toilet: Standard Discharge Bathroom Accessibility: Yes How Accessible: Accessible via walker (tight fight, may not reach the toilet) Does the patient have any problems obtaining your medications?: No   Social/Family/Support Systems Patient Roles: Spouse Anticipated Caregiver: spouse, Brent Mccormick, as well as pt's sister and brother Anticipated Caregiver's Contact Information: Brent Mccormick: [phone] Ability/Limitations of Caregiver: Mary works [employer], pt's  sister and brother will stay with him during that time if he needs supervision Caregiver Availability: 24/7 Discharge Plan Discussed with Primary Caregiver: Yes Is Caregiver In Agreement with Plan?: Yes Does Caregiver/Family have Issues with Lodging/Transportation while Pt is in Rehab?: No     Goals Patient/Family Goal for Rehab: PT/OT supervision to mod I, SLP n/a Expected length of stay: 5-7 days Pt/Family Agrees to Admission and willing to participate: Yes Program Orientation Provided & Reviewed with Pt/Caregiver Including Roles  & Responsibilities: Yes     Decrease burden of Care through IP rehab admission: n/a   Possible need for SNF placement upon discharge: No.    Patient Condition: This patient's medical and functional status has changed since the consult dated: 02/19/20 in which the Rehabilitation Physician determined and documented that the patient's condition is appropriate for intensive rehabilitative care in an inpatient rehabilitation facility. See "History of Present Illness" (above) for medical update. Functional changes are: pt min assist with mobility. Patient's medical and functional status update has been discussed with the Rehabilitation physician and patient remains appropriate for inpatient rehabilitation. Will admit to inpatient rehab today.   Preadmission Screen Completed By:  Michel Santee, PT, DPT 02/21/2020 10 32 AM ______________________________________________________________________   Discussed status with Dr. Posey Pronto on 02/21/20 at  10:36 AM  and received approval for admission today.   Admission Coordinator:  Michel Santee, PT, DPT time 10:36 AM Sudie Grumbling 02/21/20

## 2020-02-22 DIAGNOSIS — R000 Tachycardia, unspecified: Secondary | ICD-10-CM

## 2020-02-22 DIAGNOSIS — I10 Essential (primary) hypertension: Secondary | ICD-10-CM

## 2020-02-22 DIAGNOSIS — D72829 Elevated white blood cell count, unspecified: Secondary | ICD-10-CM

## 2020-02-22 DIAGNOSIS — I639 Cerebral infarction, unspecified: Secondary | ICD-10-CM

## 2020-02-22 DIAGNOSIS — Z8739 Personal history of other diseases of the musculoskeletal system and connective tissue: Secondary | ICD-10-CM

## 2020-02-22 DIAGNOSIS — G8191 Hemiplegia, unspecified affecting right dominant side: Secondary | ICD-10-CM

## 2020-02-22 MED ORDER — MELATONIN 3 MG PO TABS
3.0000 mg | ORAL_TABLET | Freq: Every day | ORAL | Status: DC
  Administered 2020-02-22 – 2020-03-03 (×11): 3 mg via ORAL
  Filled 2020-02-22 (×11): qty 1

## 2020-02-22 NOTE — Evaluation (Signed)
Physical Therapy Assessment and Plan  Patient Details  Name: Brent Stepter Sr. MRN: [id number] Date of Birth: 06-04-1950  PT Diagnosis: Abnormality of gait, Hemiparesis dominant, Impaired cognition and Muscle weakness Rehab Potential: Fair ELOS: 14 to 17 days   Today's Date: 02/22/2020 PT Individual Time: 0830-0928 PT Individual Time Calculation (min): 58 min    Hospital Problem: Principal Problem:   Infarction of left basal ganglia (HCC)   Past Medical History:  Past Medical History:  Diagnosis Date  . Carpal tunnel syndrome   . Gout   . Hyperlipidemia    on meds  . Hypertension    on meds   Past Surgical History:  Past Surgical History:  Procedure Laterality Date  . COLONOSCOPY  2015   DJ-MAC-polyps  . LOOP RECORDER INSERTION N/A 02/19/2020   Procedure: LOOP RECORDER INSERTION;  Surgeon: Constance Haw, MD;  Location: [place] [facility];  Service: Cardiovascular;  Laterality: N/A;  . no prior surgery    . POLYPECTOMY  2015   DJ-MAC-polyps    Assessment & Plan Clinical Impression: Patient is a 70 year old right-handed male with history of gout, type 2 diabetes mellitus, hypertension, hyperlipidemia, quit smoking 54 years ago.  History taken from chart review and patient.  Patient lives with spouse.  Wife is an Therapist, sports.  1 level home 5 steps to entry.  Independent prior to admission and active.  He presented on 02/16/2020 with right hemiparesis and facial droop.  Head CT unremarkable for acute intracranial process.  Chronic infarct right corona radiata.  CT angiogram of head and neck negative for large vessel occlusion.  Patient did receive TPA.  MRI showed acute infarction left basal ganglia and radiating white matter tracts.  No mass-effect or hemorrhage.  Echocardiogram with ejection fraction of 60-65%, no wall motion abnormalities grade 1 diastolic dysfunction.  Admission chemistries unremarkable except BUN 24 creatinine 1.41, WBC 10,800.  Neurology consulted maintain on  aspirin 81 mg daily and Plavix 75 mg daily x3 weeks then Plavix alone.  Patient did undergo a loop recorder placement per cardiology services 02/19/2020.  Lovenox for DVT prophylaxis.    Patient transferred to CIR on 02/21/2020 .   Patient currently requires mod with mobility secondary to muscle weakness and muscle paralysis, decreased cardiorespiratoy endurance, decreased coordination, decreased attention to right and decreased safety awareness.  Prior to hospitalization, patient was independent  with mobility and lived with Tawni Pummel (daughter lives next door) in a House home.  Home access is 7 to 8Stairs to enter.  Patient will benefit from skilled PT intervention to maximize safe functional mobility, minimize fall risk and decrease caregiver burden for planned discharge home with 24 hour supervision.  Anticipate patient will benefit from follow up [place] at discharge.  PT - End of Session Activity Tolerance: Tolerates 30+ min activity with multiple rests Endurance Deficit: Yes PT Assessment Rehab Potential (ACUTE/IP ONLY): Fair PT Barriers to Discharge: Inaccessible home environment PT Patient demonstrates impairments in the following area(s): Balance;Endurance;Motor;Safety;Perception PT Transfers Functional Problem(s): Bed Mobility;Bed to Chair;Car PT Locomotion Functional Problem(s): Ambulation;Wheelchair Mobility;Stairs PT Plan PT Intensity: Minimum of 1-2 x/day ,45 to 90 minutes PT Frequency: 5 out of 7 days PT Duration Estimated Length of Stay: 14 to 17 days PT Treatment/Interventions: Ambulation/gait training;Balance/vestibular training;Discharge planning;DME/adaptive equipment instruction;Neuromuscular re-education;Functional mobility training;Patient/family education;Stair training;Therapeutic Activities;Therapeutic Exercise;UE/LE Coordination activities;UE/LE Strength taining/ROM;Wheelchair propulsion/positioning PT Transfers Anticipated Outcome(s): S transfers PT Locomotion Anticipated  Outcome(s): c/s gait, c/g stairs, S w/c mobility PT Recommendation Follow Up Recommendations: Home health PT Patient  destination: Home Equipment Recommended: To be determined   PT Evaluation Precautions/Restrictions Precautions Precautions: Fall Precaution Comments: R hemiparesis Restrictions Weight Bearing Restrictions: No General Chart Reviewed: Yes Family/Caregiver Present: No Vital Signs Pain Pain Assessment Pain Scale: 0-10 Pain Score: 3  Pain Type: Acute pain Pain Location: Wrist Pain Orientation: Right Home Living/Prior Functioning Home Living Available Help at Discharge: Family;Available 24 hours/day Type of Home: House Home Access: Stairs to enter [employer] of Steps: 7 to 8 Entrance Stairs-Rails: Can reach both;Left;Right Home Layout: One level Bathroom Shower/Tub: Multimedia programmer: Standard Additional Comments: wife available 24/7 Tuesday through Friday, son  24/7 sat/sun, brother and sister will assist on Monday  Lives With: Spouse;Son (daughter lives next door) Prior Function Level of Independence: Independent with basic ADLs;Independent with homemaking with ambulation  Able to Take Stairs?: Yes Driving: Yes Vocation: Full time employment Comments: works from [employer]. enjoys reading historical non-fiction. Wife is RN at [employer], can be with pt 24/7 tues-fri and other family can be available to cover other days Vision/Perception  Vision - Assessment Eye Alignment: Within Functional Limits Ocular Range of Motion: Within Functional Limits Tracking/Visual Pursuits: Able to track stimulus in all quads without difficulty Perception Perception: Impaired Inattention/Neglect: Does not attend to right side of body Cognition Overall Cognitive Status: Impaired/Different from baseline Arousal/Alertness: Awake/alert Orientation Level: Oriented X4 Safety/Judgment: Impaired Sensation Sensation Light Touch: Appears  Intact Coordination Gross Motor Movements are Fluid and Coordinated: No Coordination and Movement Description: impaired R LE Motor  Motor Motor:  (hemiparesis)  Trunk/Postural Assessment  Cervical Assessment Cervical Assessment: Exceptions to [facility] Thoracic Assessment Thoracic Assessment: Exceptions to [facility] Lumbar Assessment Lumbar Assessment: Exceptions to [facility] Postural Control Postural Control: Deficits on evaluation  Balance Balance Balance Assessed: Yes Static Sitting Balance Static Sitting - Balance Support: Feet supported Static Sitting - Level of Assistance: 5: Stand by assistance Dynamic Sitting Balance Dynamic Sitting - Level of Assistance: 5: Stand by assistance Static Standing Balance Static Standing - Balance Support: During functional activity Static Standing - Level of Assistance: 4: Min assist Dynamic Standing Balance Dynamic Standing - Level of Assistance: 3: Mod assist;2: Max assist Extremity Assessment B UEs as per OT evaluation.   LUE Assessment LUE Assessment: Within Functional Limits RLE Assessment RLE Assessment: Exceptions to [facility] Passive Range of Motion (PROM) Comments: WFLs General Strength Comments: grossly 2+/5 to 3-/5 LLE Assessment LLE Assessment: Exceptions to [facility] Passive Range of Motion (PROM) Comments: WFLs General Strength Comments: grossly 3/5 to 3+/5  Care Tool Care Tool Bed Mobility Roll left and right activity        Sit to lying activity   Sit to lying assist level: Minimal Assistance - Patient > 75% (Simultaneous filing. User may not have seen previous data.)    Lying to sitting edge of bed activity   Lying to sitting edge of bed assist level: Contact Guard/Touching assist (Simultaneous filing. User may not have seen previous data.)     Care Tool Transfers Sit to stand transfer   Sit to stand assist level: Minimal Assistance - Patient > 75% (Simultaneous filing. User may not have seen previous data.)    Chair/bed transfer    Chair/bed transfer assist level: Moderate Assistance - Patient 50 - 74% (Simultaneous filing. User may not have seen previous data.)     Toilet transfer   Assist Level: Minimal Assistance - Patient > 75%    Car transfer   Car transfer assist level: Moderate Assistance - Patient 50 - 74%  Care Tool Locomotion Ambulation   Assist level: Maximal Assistance - Patient 25 - 49% Assistive device: Hand held assist Max distance: 50  Walk 10 feet activity   Assist level: Moderate Assistance - Patient - 50 - 74% Assistive device: Hand held assist   Walk 50 feet with 2 turns activity   Assist level: Maximal Assistance - Patient 25 - 49% Assistive device: Hand held assist  Walk 150 feet activity Walk 150 feet activity did not occur: Safety/medical concerns      Walk 10 feet on uneven surfaces activity Walk 10 feet on uneven surfaces activity did not occur: Safety/medical concerns      Stairs   Assist level: Maximal Assistance - Patient 25 - 49% Stairs assistive device: 1 hand rail Max number of stairs: 4  Walk up/down 1 step activity   Walk up/down 1 step (curb) assist level: Maximal Assistance - Patient 25 - 49% Walk up/down 1 step or curb assistive device: 1 hand rail    Walk up/down 4 steps activity Walk up/down 4 steps assist level: Maximal Assistance - Patient 25 - 49% Walk up/down 4 steps assistive device: 1 hand rail  Walk up/down 12 steps activity Walk up/down 12 steps activity did not occur: Safety/medical concerns      Pick up small objects from floor Pick up small object from the floor (from standing position) activity did not occur: Safety/medical concerns      Wheelchair Will patient use wheelchair at discharge?: Yes Type of Wheelchair: Manual   Wheelchair assist level: Minimal Assistance - Patient > 75% Max wheelchair distance: 75  Wheel 50 feet with 2 turns activity   Assist Level: Minimal Assistance - Patient > 75%  Wheel 150 feet activity Wheelchair 150  feet activity did not occur: Safety/medical concerns      Refer to Care Plan for Long Term Goals  SHORT TERM GOAL WEEK 1 PT Short Term Goal 1 (Week 1): Pt will increase bed to S. PT Short Term Goal 2 (Week 1): Pt will incease transfers to min A. PT Short Term Goal 3 (Week 1): Pt will ambulate with LRAD about 50 feet with min A. PT Short Term Goal 4 (Week 1): Pt will ascend/descend 6 stairs with 1 rail and mod A. PT Short Term Goal 5 (Week 1): Pt will propel w/c about 150 feet with S.  Recommendations for other services: None   Skilled Therapeutic Intervention PT evaluation completed and treatment plan initiated. Pt performed multiple stand pivot transfers with increased assistance required as patient fatigue. Pt demonstrated mild R inattention noted more as level of fatigue increased. Following treatment patient returned to room and left sitting up in bed with all needs within reach and bed alarm on.   Discharge Criteria: Patient will be discharged from PT if patient refuses treatment 3 consecutive times without medical reason, if treatment goals not met, if there is a change in medical status, if patient makes no progress towards goals or if patient is discharged from hospital.  The above assessment, treatment plan, treatment alternatives and goals were discussed and mutually agreed upon: by patient  Dub Amis 02/22/2020, 12:46 PM

## 2020-02-22 NOTE — Evaluation (Addendum)
Occupational Therapy Assessment and Plan  Patient Details  Name: Brent Mccormick. MRN: [id number] Date of Birth: [birth year]  OT Diagnosis: abnormal posture, acute pain, hemiplegia affecting dominant side and muscle weakness (generalized) Rehab Potential: Rehab Potential (ACUTE ONLY): Good ELOS: 14-17   Today's Date: 02/22/2020 OT Individual Time: 1030-1130 OT Individual Time Calculation (min): 60 min     Hospital Problem: Principal Problem:   Infarction of left basal ganglia (HCC)   Past Medical History:  Past Medical History:  Diagnosis Date  . Carpal tunnel syndrome   . Gout   . Hyperlipidemia    on meds  . Hypertension    on meds   Past Surgical History:  Past Surgical History:  Procedure Laterality Date  . COLONOSCOPY  2015   DJ-MAC-polyps  . LOOP RECORDER INSERTION N/A 02/19/2020   Procedure: LOOP RECORDER INSERTION;  Surgeon: Constance Haw, MD;  Location: [place] [facility];  Service: Cardiovascular;  Laterality: N/A;  . no prior surgery    . POLYPECTOMY  2015   DJ-MAC-polyps    Assessment & Plan Clinical Impression: The pt is a 70 yo male presenting with R-sided arm and leg weakness and R-sided facial droop. CT revealed no acute infarct, awaiting MRI, suspected L-sided lacunar infarct. tPA given 1810 1/30. PMH includes: HTN, HLD, DM, gout, and former smoker.   Patient currently requires mod with basic self-care skills secondary to muscle weakness, decreased cardiorespiratoy endurance, impaired timing and sequencing, unbalanced muscle activation, motor apraxia, decreased coordination and decreased motor planning and decreased sitting balance, decreased standing balance, decreased postural control, hemiplegia and decreased balance strategies.  Prior to hospitalization, patient could complete BADL/IADL with independent .  Patient will benefit from skilled intervention to decrease level of assist with basic self-care skills and increase independence with basic  self-care skills prior to discharge home with care partner.  Anticipate patient will require 24 hour supervision and follow up outpatient.  OT - End of Session Activity Tolerance: Tolerates 30+ min activity with multiple rests Endurance Deficit: Yes OT Assessment Rehab Potential (ACUTE ONLY): Good OT Patient demonstrates impairments in the following area(s): Balance;Edema;Endurance;Motor;Pain;Perception;Safety;Sensory;Vision OT Basic ADL's Functional Problem(s): Grooming;Bathing;Dressing;Toileting OT Transfers Functional Problem(s): Toilet;Tub/Shower OT Additional Impairment(s): Fuctional Use of Upper Extremity OT Plan OT Intensity: Minimum of 1-2 x/day, 45 to 90 minutes OT Frequency: 5 out of 7 days OT Duration/Estimated Length of Stay: 10-14 OT Treatment/Interventions: Balance/vestibular training;Discharge planning;Functional electrical stimulation;Pain management;Self Care/advanced ADL retraining;Therapeutic Activities;UE/LE Coordination activities;Therapeutic Exercise;Skin care/wound managment;Patient/family education;Functional mobility training;Disease mangement/prevention;DME/adaptive equipment instruction;Community reintegration;Neuromuscular re-education;Psychosocial support;UE/LE Strength taining/ROM;Splinting/orthotics;Wheelchair propulsion/positioning OT Self Feeding Anticipated Outcome(s): S OT Basic Self-Care Anticipated Outcome(s): S OT Toileting Anticipated Outcome(s): S OT Bathroom Transfers Anticipated Outcome(s): S OT Recommendation Patient destination: Home Follow Up Recommendations: Outpatient OT Equipment Recommended: 3 in 1 bedside comode;Tub/shower seat   OT Evaluation Precautions/Restrictions  Precautions Precautions: Fall Precaution Comments: R hemiparesis Restrictions Weight Bearing Restrictions: No General Chart Reviewed: Yes Family/Caregiver Present: No Vital Signs  Pain Pain Assessment Pain Scale: 0-10 Pain Score: 3  Pain Type: Acute pain Pain  Location: Wrist Pain Orientation: Right Home Living/Prior Functioning Home Living Family/patient expects to be discharged to:: Private residence Living Arrangements: Spouse/significant other Available Help at Discharge: Family,Available 24 hours/day Type of Home: House Home Access: Stairs to enter [employer] of Steps: 7 to 8 Entrance Stairs-Rails: Can reach both,Left,Right Home Layout: One level Bathroom Shower/Tub: Multimedia programmer: Standard Additional Comments: wife available 24/7 Tuesday through Friday, son  24/7 sat/sun, brother and sister will  assist on Monday  Lives With: Tawni Pummel (daughter lives next door) IADL History Education: couple yrs college Prior Function Level of Independence: Independent with basic ADLs,Independent with homemaking with ambulation  Able to Take Stairs?: Yes Driving: Yes Vocation: Full time employment Comments: works from [employer]. enjoys reading historical non-fiction. Wife is RN at [employer], can be with pt 24/7 tues-fri and other family can be available to cover other days Vision Wears Glasses: Reading only Eye Alignment: Within Functional Limits Ocular Range of Motion: Within Functional Limits Tracking/Visual Pursuits: Able to track stimulus in all quads without difficulty Perception  Perception: Within Functional Limits Praxis Praxis: Impaired Praxis Impairment Details: Motor planning Cognition Overall Cognitive Status: Within Functional Limits for tasks assessed Arousal/Alertness: Awake/alert Orientation Level: Person;Place;Situation Person: Oriented Place: Oriented Situation: Oriented Year: 2022 Month: February Day of Week: Correct Memory: Appears intact Immediate Memory Recall: Sock;Blue;Bed Memory Recall Sock: Without Cue Memory Recall Blue: Without Cue Memory Recall Bed: Without Cue Attention: Sustained Sustained Attention: Appears intact Safety/Judgment: Appears  intact Sensation Sensation Light Touch: Appears Intact Coordination Gross Motor Movements are Fluid and Coordinated: No Fine Motor Movements are Fluid and Coordinated: No Motor  Motor Motor: Hemiplegia  Trunk/Postural Assessment  Cervical Assessment Cervical Assessment:  (head forward) Thoracic Assessment Thoracic Assessment:  (rounded shoudlers) Lumbar Assessment Lumbar Assessment:  (post pelvic tilt) Postural Control Postural Control: Deficits on evaluation (delayed/insufficient R)  Balance Balance Balance Assessed: Yes Dynamic Sitting Balance Dynamic Sitting - Level of Assistance: 5: Stand by assistance Dynamic Standing Balance Dynamic Standing - Level of Assistance: 4: Min assist;3: Mod assist Extremity/Trunk Assessment RUE Assessment RUE Assessment: Exceptions to [facility] RUE Body System: Neuro Brunstrum levels for arm and hand: Arm;Hand Brunstrum level for arm: Stage III Synergy is performed voluntarily Brunstrum level for hand: Stage III Synergies performed voluntarily LUE Assessment LUE Assessment: Within Functional Limits  Care Tool Care Tool Self Care Eating        Oral Care    Oral Care Assist Level: Minimal Assistance - Patient > 75%    Bathing   Body parts bathed by patient: Right arm;Chest;Abdomen;Front perineal area;Buttocks;Right upper leg;Left upper leg;Face Body parts bathed by helper: Left arm;Right lower leg;Left lower leg   Assist Level: Moderate Assistance - Patient 50 - 74%    Upper Body Dressing(including orthotics)   What is the patient wearing?: Pull over shirt   Assist Level: Moderate Assistance - Patient 50 - 74%    Lower Body Dressing (excluding footwear)   What is the patient wearing?: Pants;Underwear/pull up Assist for lower body dressing: Maximal Assistance - Patient 25 - 49%    Putting on/Taking off footwear   What is the patient wearing?: Non-skid slipper socks Assist for footwear: Dependent - Patient 0%       Care Tool  Toileting Toileting activity   Assist for toileting: Maximal Assistance - Patient 25 - 49%     Care Tool Bed Mobility Roll left and right activity        Sit to lying activity   Sit to lying assist level: Minimal Assistance - Patient > 75% (Simultaneous filing. User may not have seen previous data.)    Lying to sitting edge of bed activity   Lying to sitting edge of bed assist level: Contact Guard/Touching assist (Simultaneous filing. User may not have seen previous data.)     Care Tool Transfers Sit to stand transfer   Sit to stand assist level: Minimal Assistance - Patient > 75% (Simultaneous filing. User may  not have seen previous data.)    Chair/bed transfer   Chair/bed transfer assist level: Moderate Assistance - Patient 50 - 74% (Simultaneous filing. User may not have seen previous data.)     Toilet transfer   Assist Level: Minimal Assistance - Patient > 75%     Care Tool Cognition Expression of Ideas and Wants Expression of Ideas and Wants: Without difficulty (complex and basic) - expresses complex messages without difficulty and with speech that is clear and easy to understand   Understanding Verbal and Non-Verbal Content Understanding Verbal and Non-Verbal Content: Understands (complex and basic) - clear comprehension without cues or repetitions   Memory/Recall Ability *first 3 days only Memory/Recall Ability *first 3 days only: Current season;Staff names and faces;That he or she is in a hospital/hospital unit    Refer to Care Plan for Long Term Goals  SHORT TERM GOAL WEEK 1 OT Short Term Goal 1 (Week 1): pt will don shirt wiht MIN A consistently OT Short Term Goal 2 (Week 1): Pt will recall hemi strategies wiht no VC OT Short Term Goal 3 (Week 1): Pt will thread BLE into pants OT Short Term Goal 4 (Week 1): Pt will compelte 2/3 components of toileting OT Short Term Goal 5 (Week 1): Pt will trasnfer to toilet wiht CGA and LRAD  Recommendations for other services:  Therapeutic Recreation  Pet therapy and Outing/community reintegration   Skilled Therapeutic Intervention 1:1. Pt received in bed and educated on OT role/purpose, CIR, ELOS and POC. Pt completes BADL at shower level using stand pivot transfers as stated below. Pt follows direction well and is educated on hemi strategies/RUE as stabilizer after assessement and carrys over during session. Pt is motivated and has good network of support to excell in CIR level tx. Exited session with pt seated in bed, exit alarm on and call light in reach  ADL ADL Grooming: Minimal assistance Where Assessed-Grooming: Standing at sink Upper Body Bathing: Minimal assistance Where Assessed-Upper Body Bathing: Shower Lower Body Bathing: Moderate assistance Where Assessed-Lower Body Bathing: Shower Upper Body Dressing: Moderate assistance Where Assessed-Upper Body Dressing: Sitting at sink Lower Body Dressing: Maximal assistance Where Assessed-Lower Body Dressing: Sitting at sink Toileting: Maximal assistance Where Assessed-Toileting: Bedside Commode Toilet Transfer: Minimal assistance Toilet Transfer Method: Stand pivot Social research officer, government: Minimal assistance Social research officer, government Method: Stand pivot Mobility  Transfers Sit to Stand: Minimal Assistance - Patient > 75% Stand to Sit: Minimal Assistance - Patient > 75%   Discharge Criteria: Patient will be discharged from OT if patient refuses treatment 3 consecutive times without medical reason, if treatment goals not met, if there is a change in medical status, if patient makes no progress towards goals or if patient is discharged from hospital.  The above assessment, treatment plan, treatment alternatives and goals were discussed and mutually agreed upon: by patient  Tonny Branch 02/22/2020, 12:28 PM

## 2020-02-22 NOTE — Progress Notes (Signed)
Physical Therapy Session Note  Patient Details  Name: Brent Wegmann Sr. MRN: [id number] Date of Birth: [birth year]  Today's Date: 02/22/2020 PT Individual Time: 1400-1429 PT Individual Time Calculation (min): 29 min   Short Term Goals: Week 1:  PT Short Term Goal 1 (Week 1): Pt will increase bed to S. PT Short Term Goal 2 (Week 1): Pt will incease transfers to min A. PT Short Term Goal 3 (Week 1): Pt will ambulate with LRAD about 50 feet with min A. PT Short Term Goal 4 (Week 1): Pt will ascend/descend 6 stairs with 1 rail and mod A. PT Short Term Goal 5 (Week 1): Pt will propel w/c about 150 feet with S.  Skilled Therapeutic Interventions/Progress Updates:  Pt was seen bedside the pm. Treatment focused on stand pivot transfers and block practice of sit to stand transfers. Pt performed multiple sit to stand transfers, 3 sets x 5 reps each. Pt performed multiple stand pivot transfers with min to mod A to the R and min A with verbal cues to the L. Pt left sitting up in recliner at end of treatment with all needs within reach.   Therapy Documentation Precautions:  Precautions Precautions: Fall Precaution Comments: R hemiparesis Restrictions Weight Bearing Restrictions: No General: Pain: No c/o pain.    Therapy/Group: Individual Therapy  Dub Amis 02/22/2020, 3:07 PM

## 2020-02-22 NOTE — Plan of Care (Signed)
  Problem: RH SKIN INTEGRITY Goal: RH STG SKIN FREE OF INFECTION/BREAKDOWN Description: No new skin breakdown this admission Outcome: Progressing Goal: RH STG MAINTAIN SKIN INTEGRITY WITH ASSISTANCE Description: STG Maintain Skin Integrity With  MOD Assistance. Outcome: Progressing Goal: RH STG ABLE TO PERFORM INCISION/WOUND CARE W/ASSISTANCE Description: STG Able To Perform Incision/Wound Care With  MOD Assistance. Outcome: Progressing   Problem: RH SAFETY Goal: RH STG ADHERE TO SAFETY PRECAUTIONS W/ASSISTANCE/DEVICE Description: STG Adhere to Safety Precautions With  MOD Assistance/Device. Outcome: Progressing Goal: RH STG DECREASED RISK OF FALL WITH ASSISTANCE Description: STG Decreased Risk of Fall With  MOD Assistance. Outcome: Progressing   Problem: RH PAIN MANAGEMENT Goal: RH STG PAIN MANAGED AT OR BELOW PT'S PAIN GOAL Description: Less than 3 out of 10 Outcome: Progressing   Problem: RH KNOWLEDGE DEFICIT GENERAL Goal: RH STG INCREASE KNOWLEDGE OF SELF CARE AFTER HOSPITALIZATION Outcome: Progressing

## 2020-02-22 NOTE — Progress Notes (Signed)
Occupational Therapy Session Note  Patient Details  Name: Brent Edgell Sr. MRN: [id number] Date of Birth: Nov 22, 1950  Today's Date: 02/22/2020 OT Individual Time: 1530-1615 OT Individual Time Calculation (min): 45 min    Short Term Goals: Week 1:  OT Short Term Goal 1 (Week 1): pt will don shirt wiht MIN A consistently OT Short Term Goal 2 (Week 1): Pt will recall hemi strategies wiht no VC OT Short Term Goal 3 (Week 1): Pt will thread BLE into pants OT Short Term Goal 4 (Week 1): Pt will compelte 2/3 components of toileting OT Short Term Goal 5 (Week 1): Pt will trasnfer to toilet wiht CGA and LRAD  Skilled Therapeutic Interventions/Progress Updates:    1:1. Pt received in recliner agreeable to OT. Pt completes stand pivot transfers to/from w/c with MIN A with VC for hand placement. Pt with edema in R wrsit. Educated on elevation/positioning as well as applied K tape in basket weave pattern to improve edema. Pt completes box and blocks assessment RUE: 2 LUE 47. Pt completes chair tipping activity with RUE grasping handle and OT facilitating scapular mobility during activity with MOD facilitation to reduce gravity on shoulder flex/ext. Exited session with pt seated in bed, exit alarm on and call light in reach   Therapy Documentation Precautions:  Precautions Precautions: Fall Precaution Comments: R hemiparesis Restrictions Weight Bearing Restrictions: No General:   Vital Signs: Therapy Vitals Temp: 98.4 F (36.9 C) Pulse Rate: (!) 109 Resp: 16 BP: (!) 148/95 Patient Position (if appropriate): Sitting Oxygen Therapy SpO2: 99 % O2 Device: Room Air Pain:   ADL: ADL Grooming: Minimal assistance Where Assessed-Grooming: Standing at sink Upper Body Bathing: Minimal assistance Where Assessed-Upper Body Bathing: Shower Lower Body Bathing: Moderate assistance Where Assessed-Lower Body Bathing: Shower Upper Body Dressing: Moderate assistance Where Assessed-Upper Body  Dressing: Sitting at sink Lower Body Dressing: Maximal assistance Where Assessed-Lower Body Dressing: Sitting at sink Toileting: Maximal assistance Where Assessed-Toileting: Bedside Commode Toilet Transfer: Minimal assistance Toilet Transfer Method: Stand pivot Gaffer Transfer: Minimal assistance Social research officer, government Method: Stand pivot Vision   Perception    Praxis Praxis: Impaired Praxis Impairment Details: Motor planning Exercises:   Other Treatments:     Therapy/Group: Individual Therapy  Tonny Branch 02/22/2020, 4:53 PM

## 2020-02-22 NOTE — Progress Notes (Signed)
[place] PHYSICAL MEDICINE & REHABILITATION PROGRESS NOTE  Subjective/Complaints: Patient seen sitting up in bed this morning, back to work with therapies.  He states he slept well overnight.  He states he is ready begin therapies.  He notes improvement in right wrist pain.  ROS: Denies CP, SOB, N/V/D  Objective: Vital Signs: Blood pressure (!) 138/93, pulse 99, temperature 98.4 F (36.9 C), temperature source Oral, resp. rate 16, height 5\' 10"  (1.778 m), weight 77.4 kg, SpO2 99 %. No results found. Recent Labs    02/21/20 0511  WBC 11.1*  HGB 14.1  HCT 43.4  PLT 239   Recent Labs    02/20/20 0357 02/21/20 0511  NA 136 138  K 3.5 3.8  CL 102 104  CO2 23 20*  GLUCOSE 122* 117*  BUN 15 24*  CREATININE 1.12 1.11  CALCIUM 8.9 8.9    Intake/Output Summary (Last 24 hours) at 02/22/2020 1055 Last data filed at 02/22/2020 0858 Gross per 24 hour  Intake 360 ml  Output 575 ml  Net -215 ml        Physical Exam: BP (!) 138/93 (BP Location: Right Arm)   Pulse 99   Temp 98.4 F (36.9 C) (Oral)   Resp 16   Ht 5\' 10"  (1.778 m)   Wt 77.4 kg   SpO2 99%   BMI 24.48 kg/m  Constitutional: No distress . Vital signs reviewed. HENT: Normocephalic.  Atraumatic. Eyes: EOMI. No discharge. Cardiovascular: No JVD.  RRR. Respiratory: Normal effort.  No stridor.  Bilateral clear to auscultation. GI: Non-distended.  BS +. Skin: Warm and dry.  Intact. Psych: Normal mood.  Normal behavior. Musc: Right wrist with mild edema and tenderness, improving Neuro: Alert Makes good eye contact with examiner.   Follows commands.   Fair insight and awareness of deficits. Motor: LUE/LLE: 5/5 proximal distal RUE: 3+/5 proximal to distal, stable RLE: 4+/5 proximal to distal   Assessment/Plan: 1. Functional deficits which require 3+ hours per day of interdisciplinary therapy in a comprehensive inpatient rehab setting.  Physiatrist is providing close team supervision and 24 hour management of  active medical problems listed below.  Physiatrist and rehab team continue to assess barriers to discharge/monitor patient progress toward functional and medical goals   Care Tool:  Bathing              Bathing assist       Upper Body Dressing/Undressing Upper body dressing        Upper body assist      Lower Body Dressing/Undressing Lower body dressing            Lower body assist       Toileting Toileting    Toileting assist       Transfers Chair/bed transfer  Transfers assist           Locomotion Ambulation   Ambulation assist              Walk 10 feet activity   Assist           Walk 50 feet activity   Assist           Walk 150 feet activity   Assist           Walk 10 feet on uneven surface  activity   Assist           Wheelchair     Assist  Wheelchair 50 feet with 2 turns activity    Assist            Wheelchair 150 feet activity     Assist           Medical Problem List and Plan: 1.  Right hemiparesis secondary to ischemic infarct left basal ganglia likely due to small vessel disease.  Status post loop recorder  Begin CIR evaluations 2.  Antithrombotics: -DVT/anticoagulation: Lovenox             -antiplatelet therapy: Aspirin 81 mg daily and Plavix 75 mg daily x3 weeks then Plavix alone 3. Pain Management: Tylenol as needed 4. Mood: Provide emotional support             -antipsychotic agents: N/A 5. Neuropsych: This patient is capable of making decisions on his own behalf. 6. Skin/Wound Care: Routine skin checks 7. Fluids/Electrolytes/Nutrition: Routine in and outs             CMP ordered for Monday 8.  Hyperlipidemia: Crestor 9.  History of gout.  Colchicine 0.6 mg every other day.               Monitor for flares  Controlled at present 10.  Essential hypertension.  Cozaar 100 mg daily             Monitor with increased mobility 12.   Tachycardia-likely secondary to deconditioning             ECG on 2/4 reviewed-normal  ?  Improving 13.  Leukocytosis             WBCs 10.9 on 2/1, CBC ordered for Monday             Afebrile, no signs/symptoms of infection     LOS: 1 days A FACE TO FACE EVALUATION WAS PERFORMED  Salvatore Poe Lorie Phenix 02/22/2020, 10:55 AM

## 2020-02-23 DIAGNOSIS — R799 Abnormal finding of blood chemistry, unspecified: Secondary | ICD-10-CM | POA: Diagnosis not present

## 2020-02-23 DIAGNOSIS — G479 Sleep disorder, unspecified: Secondary | ICD-10-CM

## 2020-02-23 DIAGNOSIS — I639 Cerebral infarction, unspecified: Secondary | ICD-10-CM | POA: Diagnosis not present

## 2020-02-23 DIAGNOSIS — D72829 Elevated white blood cell count, unspecified: Secondary | ICD-10-CM | POA: Diagnosis not present

## 2020-02-23 NOTE — Progress Notes (Signed)
Physical Therapy Session Note  Patient Details  Name: Brent Betzold Sr. MRN: [id number] Date of Birth: 03-Jun-1950  Today's Date: 02/23/2020 PT Individual Time: 1508-1610 PT Individual Time Calculation (min): 62 min   Short Term Goals: Week 1:  PT Short Term Goal 1 (Week 1): Pt will increase bed to S. PT Short Term Goal 2 (Week 1): Pt will incease transfers to min A. PT Short Term Goal 3 (Week 1): Pt will ambulate with LRAD about 50 feet with min A. PT Short Term Goal 4 (Week 1): Pt will ascend/descend 6 stairs with 1 rail and mod A. PT Short Term Goal 5 (Week 1): Pt will propel w/c about 150 feet with S.  Skilled Therapeutic Interventions/Progress Updates:    Pt received seated in w/c in room, agreeable to PT session. No complaints of pain, does report LE soreness from previous date's sessions. Education with patient regarding muscle soreness and recovery. Dependent transport via w/c to therapy gym for time and energy conservation. Sit to stand with mod A with no AD. Ambulation x 30 ft with mod HHA with 2 LOB to the R requiring mod A to recover. Set pt up with RW for improved safety and support during gait. Ambulation x 100 ft, x 200 ft with RW and min to mod A needed for RLE management on RW and for overall balance. Pt intermittently performs step-to and step-through gait, initially exhibits decreased RLE clearance and step length that improves as gait progresses. Pt also exhibits some difficulty grasping RW with RW, applied [place] to RW for improved grip but pt would also benefit from hand splint when available. Standing alt L/R 4" step-taps with RW and CGA for balance with focus on LE strengthening, coordination, and eccentric control, 2 x 15 reps to fatigue. Standing squats 2 x 10 reps with RW and CGA for balance for LE strengthening. Pt left seated in w/c in room with needs in reach at end of session.  Therapy Documentation Precautions:  Precautions Precautions: Fall Precaution Comments: R  hemiparesis Restrictions Weight Bearing Restrictions: No   Therapy/Group: Individual Therapy   Excell Seltzer, PT, DPT  02/23/2020, 6:05 PM

## 2020-02-23 NOTE — Plan of Care (Signed)
  Problem: RH SKIN INTEGRITY Goal: RH STG SKIN FREE OF INFECTION/BREAKDOWN Description: No new skin breakdown this admission Outcome: Progressing Goal: RH STG MAINTAIN SKIN INTEGRITY WITH ASSISTANCE Description: STG Maintain Skin Integrity With  MOD Assistance. Outcome: Progressing Goal: RH STG ABLE TO PERFORM INCISION/WOUND CARE W/ASSISTANCE Description: STG Able To Perform Incision/Wound Care With  MOD Assistance. Outcome: Progressing   Problem: RH SAFETY Goal: RH STG ADHERE TO SAFETY PRECAUTIONS W/ASSISTANCE/DEVICE Description: STG Adhere to Safety Precautions With  MOD Assistance/Device. Outcome: Progressing Goal: RH STG DECREASED RISK OF FALL WITH ASSISTANCE Description: STG Decreased Risk of Fall With  MOD Assistance. Outcome: Progressing   Problem: RH PAIN MANAGEMENT Goal: RH STG PAIN MANAGED AT OR BELOW PT'S PAIN GOAL Description: Less than 3 out of 10 Outcome: Progressing   Problem: RH KNOWLEDGE DEFICIT GENERAL Goal: RH STG INCREASE KNOWLEDGE OF SELF CARE AFTER HOSPITALIZATION Outcome: Progressing   

## 2020-02-23 NOTE — Progress Notes (Addendum)
[place] PHYSICAL MEDICINE & REHABILITATION PROGRESS NOTE  Subjective/Complaints: Patient seen sitting up in bed this morning.  He states he slept fairly overnight due to being sore from therapies yesterday, but states that he was doing well.  Wrist improvement.  ROS: Denies CP, SOB, N/V/D  Objective: Vital Signs: Blood pressure (!) 149/91, pulse (!) 104, temperature 98.8 F (37.1 C), temperature source Oral, resp. rate 14, height 5\' 10"  (1.778 m), weight 77.4 kg, SpO2 98 %. No results found. Recent Labs    02/21/20 0511  WBC 11.1*  HGB 14.1  HCT 43.4  PLT 239   Recent Labs    02/21/20 0511  NA 138  K 3.8  CL 104  CO2 20*  GLUCOSE 117*  BUN 24*  CREATININE 1.11  CALCIUM 8.9    Intake/Output Summary (Last 24 hours) at 02/23/2020 1413 Last data filed at 02/23/2020 0838 Gross per 24 hour  Intake 350 ml  Output 475 ml  Net -125 ml        Physical Exam: BP (!) 149/91 (BP Location: Left Arm)   Pulse (!) 104   Temp 98.8 F (37.1 C) (Oral)   Resp 14   Ht 5\' 10"  (1.778 m)   Wt 77.4 kg   SpO2 98%   BMI 24.48 kg/m  Constitutional: No distress . Vital signs reviewed. HENT: Normocephalic.  Atraumatic. Eyes: EOMI. No discharge. Cardiovascular: No JVD.  RRR. Respiratory: Normal effort.  No stridor.  Bilateral clear to auscultation. GI: Non-distended.  BS +. Skin: Warm and dry.  Intact. Psych: Normal mood.  Normal behavior. Musc: Mild right wrist edema and tenderness, improving  Neuro: Alert Makes good eye contact with examiner.   Follows commands.   Fair insight and awareness of deficits. Motor: LUE/LLE: 5/5 proximal distal RUE: 3+/5 proximal to distal, improving RLE: 4+/5 proximal to distal   Assessment/Plan: 1. Functional deficits which require 3+ hours per day of interdisciplinary therapy in a comprehensive inpatient rehab setting.  Physiatrist is providing close team supervision and 24 hour management of active medical problems listed  below.  Physiatrist and rehab team continue to assess barriers to discharge/monitor patient progress toward functional and medical goals   Care Tool:  Bathing    Body parts bathed by patient: Right arm,Chest,Abdomen,Front perineal area,Right upper leg,Left upper leg,Left lower leg,Right lower leg,Face   Body parts bathed by helper: Left arm,Buttocks     Bathing assist Assist Level: Moderate Assistance - Patient 50 - 74%     Upper Body Dressing/Undressing Upper body dressing   What is the patient wearing?: Hospital gown only    Upper body assist Assist Level: Minimal Assistance - Patient > 75%    Lower Body Dressing/Undressing Lower body dressing      What is the patient wearing?: Pants,Underwear/pull up     Lower body assist Assist for lower body dressing: Maximal Assistance - Patient 25 - 49%     Toileting Toileting    Toileting assist Assist for toileting: Maximal Assistance - Patient 25 - 49%     Transfers Chair/bed transfer  Transfers assist     Chair/bed transfer assist level: Moderate Assistance - Patient 50 - 74%     Locomotion Ambulation   Ambulation assist      Assist level: Maximal Assistance - Patient 25 - 49% Assistive device: Hand held assist Max distance: 50   Walk 10 feet activity   Assist     Assist level: Moderate Assistance - Patient - 50 - 74% Assistive  device: Hand held assist   Walk 50 feet activity   Assist    Assist level: Maximal Assistance - Patient 25 - 49% Assistive device: Hand held assist    Walk 150 feet activity   Assist Walk 150 feet activity did not occur: Safety/medical concerns         Walk 10 feet on uneven surface  activity   Assist Walk 10 feet on uneven surfaces activity did not occur: Safety/medical concerns         Wheelchair     Assist Will patient use wheelchair at discharge?: Yes Type of Wheelchair: Manual    Wheelchair assist level: Minimal Assistance - Patient >  75% Max wheelchair distance: 75    Wheelchair 50 feet with 2 turns activity    Assist        Assist Level: Minimal Assistance - Patient > 75%   Wheelchair 150 feet activity     Assist  Wheelchair 150 feet activity did not occur: Safety/medical concerns        Medical Problem List and Plan: 1.  Right hemiparesis secondary to ischemic infarct left basal ganglia likely due to small vessel disease.  Status post loop recorder  Continue CIR 2.  Antithrombotics: -DVT/anticoagulation: Lovenox             -antiplatelet therapy: Aspirin 81 mg daily and Plavix 75 mg daily x3 weeks then Plavix alone 3. Pain Management: Tylenol as needed 4. Mood: Provide emotional support             -antipsychotic agents: N/A 5. Neuropsych: This patient is capable of making decisions on his own behalf. 6. Skin/Wound Care: Routine skin checks 7. Fluids/Electrolytes/Nutrition: Routine in and outs             CMP ordered for tomorrow 8.  Hyperlipidemia: Crestor 9.  History of gout.  Colchicine 0.6 mg every other day.               Monitor for flares  Controlled at present 10.  Essential hypertension.  Cozaar 100 mg daily  Mildly elevated on 2/6, monitor for persistence             Monitor with increased mobility 12.  Tachycardia-likely secondary to deconditioning             ECG on 2/4 reviewed-normal  ?  Improving 13.  Leukocytosis             WBCs 11.1 on 2/4, CBC ordered for tomorrow             Afebrile, no signs/symptoms of infection   14.  Elevated BUN  Encourage fluids 15.  Sleep disturbance  Melatonin with benefit   LOS: 2 days A FACE TO FACE EVALUATION WAS PERFORMED  Jisel Fleet Lorie Phenix 02/23/2020, 2:13 PM

## 2020-02-24 DIAGNOSIS — I639 Cerebral infarction, unspecified: Secondary | ICD-10-CM | POA: Diagnosis not present

## 2020-02-24 LAB — COMPREHENSIVE METABOLIC PANEL
ALT: 14 U/L (ref 0–44)
AST: 13 U/L — ABNORMAL LOW (ref 15–41)
Albumin: 3.2 g/dL — ABNORMAL LOW (ref 3.5–5.0)
Alkaline Phosphatase: 48 U/L (ref 38–126)
Anion gap: 9 (ref 5–15)
BUN: 22 mg/dL (ref 8–23)
CO2: 26 mmol/L (ref 22–32)
Calcium: 9.4 mg/dL (ref 8.9–10.3)
Chloride: 104 mmol/L (ref 98–111)
Creatinine, Ser: 1.09 mg/dL (ref 0.61–1.24)
GFR, Estimated: >60 mL/min (ref >60)
Glucose, Bld: 124 mg/dL — ABNORMAL HIGH (ref 70–99)
Potassium: 4.2 mmol/L (ref 3.5–5.1)
Sodium: 139 mmol/L (ref 135–145)
Total Bilirubin: 0.6 mg/dL (ref 0.3–1.2)
Total Protein: 7 g/dL (ref 6.5–8.1)

## 2020-02-24 LAB — CBC WITH DIFFERENTIAL/PLATELET
Abs Immature Granulocytes: 0.03 10*3/uL (ref 0.00–0.07)
Basophils Absolute: 0 10*3/uL (ref 0.0–0.1)
Basophils Relative: 1 %
Eosinophils Absolute: 0.1 10*3/uL (ref 0.0–0.5)
Eosinophils Relative: 2 %
HCT: 40.9 % (ref 39.0–52.0)
Hemoglobin: 14.1 g/dL (ref 13.0–17.0)
Immature Granulocytes: 0 %
Lymphocytes Relative: 18 %
Lymphs Abs: 1.5 10*3/uL (ref 0.7–4.0)
MCH: 30.5 pg (ref 26.0–34.0)
MCHC: 34.5 g/dL (ref 30.0–36.0)
MCV: 88.5 fL (ref 80.0–100.0)
Monocytes Absolute: 0.8 10*3/uL (ref 0.1–1.0)
Monocytes Relative: 10 %
Neutro Abs: 5.8 10*3/uL (ref 1.7–7.7)
Neutrophils Relative %: 69 %
Platelets: 312 10*3/uL (ref 150–400)
RBC: 4.62 MIL/uL (ref 4.22–5.81)
RDW: 12.6 % (ref 11.5–15.5)
WBC: 8.4 10*3/uL (ref 4.0–10.5)
nRBC: 0 % (ref 0.0–0.2)

## 2020-02-24 NOTE — Progress Notes (Signed)
Pt voiced concern about being "down" and depressed due to current physical condition. Emotional support provided to the pt. Pt also concerned about R foot, New pain noted on the top of his foot. +2 pulse, skin warn to touch, no obvious signs of injury. Provider will be made aware of pts concerns.

## 2020-02-24 NOTE — Progress Notes (Signed)
Physical Therapy Session Note  Patient Details  Name: Brent Semmel Sr. MRN: [id number] Date of Birth: 10/26/1950  Today's Date: 02/24/2020 PT Individual Time: 1100-1200, 1500-1600 PT Individual Time Calculation (min): 60 min, 60 min  Short Term Goals: Week 1:  PT Short Term Goal 1 (Week 1): Pt will increase bed to S. PT Short Term Goal 2 (Week 1): Pt will incease transfers to min A. PT Short Term Goal 3 (Week 1): Pt will ambulate with LRAD about 50 feet with min A. PT Short Term Goal 4 (Week 1): Pt will ascend/descend 6 stairs with 1 rail and mod A. PT Short Term Goal 5 (Week 1): Pt will propel w/c about 150 feet with S.  Skilled Therapeutic Interventions/Progress Updates:     AM session:  Pt seated in wc on arrival and agreeable to PT. Pt performed gait x 100 ft, x 150 ft, x 200 ft with RW and min A for R hand grip VCs to continue lifting RLE. Pt demonstrates more normalized gait but continues to demonstrate low step clearance. Pt performed step downs and step ups 2x10 bilaterally using 2 HR or RW with CGA and VCs for knee extension in standing. Pt kicked soccer ball x 8 min with RW progressing from min A for balance to CGA. Pt returned to room and was left in recliner with all needs in reach and chair alarm active.   PM session:  Pt seated in recliner on arrival. Dycem applied to R RW grip, pt demonstrated improved grip but still needed some assist at times. Pt ambulated to and from day room, 300 ft with RW and min for R hand grip. Pt performed standing balance activities without UE support, including step taps 2x10, STS with ball toss, 3x10, and cornhole with R step x 2 rounds. All activities performed with CGA for minimal LOB, which the pt was able to recover. Pt returned to room and was left seated in recliner with all needs in reach and chair alarm active.  Therapy Documentation Precautions:  Precautions Precautions: Fall Precaution Comments: R hemiparesis Restrictions Weight  Bearing Restrictions: No General:    Therapy/Group: Individual Therapy  Sharen Counter, SPT 02/24/2020, 7:56 AM

## 2020-02-24 NOTE — Care Management (Signed)
[facility] Individual Statement of Services  Patient Name:  Brent Mccormick.  Date:  02/24/2020  Welcome to the [place].  Our goal is to provide you with an individualized program based on your diagnosis and situation, designed to meet your specific needs.  With this comprehensive rehabilitation program, you will be expected to participate in at least 3 hours of rehabilitation therapies Monday-Friday, with modified therapy programming on the weekends.  Your rehabilitation program will include the following services:  Physical Therapy (PT), Occupational Therapy (OT), 24 hour per day rehabilitation nursing, Therapeutic Recreaction (TR), Psychology, Neuropsychology, Care Coordinator, Rehabilitation Medicine, Nutrition Services, Pharmacy Services and Other  Weekly team conferences will be held on Tuesdays to discuss your progress.  Your Inpatient Rehabilitation Care Coordinator will talk with you frequently to get your input and to update you on team discussions.  Team conferences with you and your family in attendance may also be held.  Expected length of stay: 14-17 days    Overall anticipated outcome: Supervision  Depending on your progress and recovery, your program may change. Your Inpatient Rehabilitation Care Coordinator will coordinate services and will keep you informed of any changes. Your Inpatient Rehabilitation Care Coordinator's name and contact numbers are listed  below.  The following services may also be recommended but are not provided by the [facility] will be made to provide these services after discharge if needed.  Arrangements include referral to agencies that provide these services.  Your insurance has been verified to be:  [employer]  Your primary doctor is:   Dorothyann Peng  Pertinent information will be shared with your doctor and your insurance company.  Inpatient Rehabilitation Care Coordinator:  Cathleen Corti [phone] or ([phone]  Information discussed with and copy given to patient by: Rana Snare, 02/24/2020, 9:58 AM

## 2020-02-24 NOTE — Progress Notes (Signed)
[facility] PHYSICAL MEDICINE & REHABILITATION PROGRESS NOTE  Subjective/Complaints:  Pt in shower about the take a shower- with OT- sitting on tub bench.   Pt reports slept the "best he's done in the hospital".  Denies pain, but does not having soreness from therapies.  Top of feet also bothering him.  R wrist is less painful, less swollen with Ktaping.   Per nursing note, was very down/depressed last night due to level of function.    ROS:  Pt denies SOB, abd pain, CP, N/V/C/D, and vision changes   Objective: Vital Signs: Blood pressure 137/86, pulse 88, temperature 98 F (36.7 C), resp. rate 18, height 5\' 10"  (1.778 m), weight 77.4 kg, SpO2 95 %. No results found. Recent Labs    02/24/20 0556  WBC 8.4  HGB 14.1  HCT 40.9  PLT 312   Recent Labs    02/24/20 0556  NA 139  K 4.2  CL 104  CO2 26  GLUCOSE 124*  BUN 22  CREATININE 1.09  CALCIUM 9.4    Intake/Output Summary (Last 24 hours) at 02/24/2020 1255 Last data filed at 02/24/2020 0745 Gross per 24 hour  Intake 218 ml  Output 375 ml  Net -157 ml        Physical Exam: BP 137/86 (BP Location: Left Arm)   Pulse 88   Temp 98 F (36.7 C)   Resp 18   Ht 5\' 10"  (1.778 m)   Wt 77.4 kg   SpO2 95%   BMI 24.48 kg/m  Constitutional: sitting on tub bench in shower, water not on yet, NAD HENT: Normocephalic.  Atraumatic. Eyes: EOMI. No discharge. Cardiovascular: RRR- no JVD Respiratory: CTA B/L- no W/R/R- good air movement GI: Soft, NT, ND, (+)BS  Skin: Warm and dry.  Intact. Psych: appropriate, sad affect Musc: Mild right wrist edema and tenderness, improving  Neuro: Alert Makes good eye contact with examiner.   Follows commands.   Fair insight and awareness of deficits. Motor: LUE/LLE: 5/5 proximal distal RUE: 3+/5 proximal to distal, improving RLE: 4+/5 proximal to distal   Assessment/Plan: 1. Functional deficits which require 3+ hours per day of interdisciplinary therapy in a comprehensive  inpatient rehab setting.  Physiatrist is providing close team supervision and 24 hour management of active medical problems listed below.  Physiatrist and rehab team continue to assess barriers to discharge/monitor patient progress toward functional and medical goals   Care Tool:  Bathing    Body parts bathed by patient: Right arm,Chest,Abdomen,Front perineal area,Right upper leg,Left upper leg,Left lower leg,Right lower leg,Face,Buttocks   Body parts bathed by helper: Left arm     Bathing assist Assist Level: Minimal Assistance - Patient > 75%     Upper Body Dressing/Undressing Upper body dressing   What is the patient wearing?: Pull over shirt    Upper body assist Assist Level: Minimal Assistance - Patient > 75%    Lower Body Dressing/Undressing Lower body dressing      What is the patient wearing?: Pants,Underwear/pull up     Lower body assist Assist for lower body dressing: Moderate Assistance - Patient 50 - 74%     Toileting Toileting    Toileting assist Assist for toileting: Maximal Assistance - Patient 25 - 49%     Transfers Chair/bed transfer  Transfers assist     Chair/bed transfer assist level: Minimal Assistance - Patient > 75%     Locomotion Ambulation   Ambulation assist      Assist level: Moderate Assistance -  Patient 50 - 74% Assistive device: Walker-rolling Max distance: 150'   Walk 10 feet activity   Assist     Assist level: Moderate Assistance - Patient - 50 - 74% Assistive device: Walker-rolling   Walk 50 feet activity   Assist    Assist level: Moderate Assistance - Patient - 50 - 74% Assistive device: Walker-rolling    Walk 150 feet activity   Assist Walk 150 feet activity did not occur: Safety/medical concerns  Assist level: Moderate Assistance - Patient - 50 - 74% Assistive device: Walker-rolling    Walk 10 feet on uneven surface  activity   Assist Walk 10 feet on uneven surfaces activity did not occur:  Safety/medical concerns         Wheelchair     Assist Will patient use wheelchair at discharge?: Yes Type of Wheelchair: Manual    Wheelchair assist level: Minimal Assistance - Patient > 75% Max wheelchair distance: 75    Wheelchair 50 feet with 2 turns activity    Assist        Assist Level: Minimal Assistance - Patient > 75%   Wheelchair 150 feet activity     Assist      Assist Level: Moderate Assistance - Patient 50 - 74%    Medical Problem List and Plan: 1.  Right hemiparesis secondary to ischemic infarct left basal ganglia likely due to small vessel disease.  Status post loop recorder  Continue CIR 2.  Antithrombotics: -DVT/anticoagulation: Lovenox             -antiplatelet therapy: Aspirin 81 mg daily and Plavix 75 mg daily x3 weeks then Plavix alone 3. Pain Management: Tylenol as needed 4. Mood: Provide emotional support  2/7- if mood doesn't improve, need to add an SSRI             -antipsychotic agents: N/A 5. Neuropsych: This patient is capable of making decisions on his own behalf. 6. Skin/Wound Care: Routine skin checks 7. Fluids/Electrolytes/Nutrition: Routine in and outs             CMP ordered for tomorrow 8.  Hyperlipidemia: Crestor 9.  History of gout.  Colchicine 0.6 mg every other day.               Monitor for flares  Controlled at present 10.  Essential hypertension.  Cozaar 100 mg daily  Mildly elevated on 2/6, monitor for persistence  2/7- BP 137/86- staying in 130s/80s- con't regimen             Monitor with increased mobility 12.  Tachycardia-likely secondary to deconditioning             ECG on 2/4 reviewed-normal  ?  Improving 13.  Leukocytosis             WBCs 11.1 on 2/4, CBC ordered for tomorrow  2/7- WBC 8.4- no signs/Sx's of infection- con't to monitor             Afebrile, no signs/symptoms of infection   14.  Elevated BUN  2/7- BUN 22- slightly improved- con't to push PO fluids  Encourage fluids 15.  Sleep  disturbance  Melatonin with benefit  2/7- "slept the best in weeks"- con't regimen 16. R wrist swelling/pain  2/7- improved with K-taping- con't regimen   LOS: 3 days A FACE TO FACE EVALUATION WAS PERFORMED  Evyn Kooyman 02/24/2020, 12:55 PM

## 2020-02-24 NOTE — IPOC Note (Signed)
Overall Plan of Care [facility]) Patient Details Name: Brent Ahrendt Sr. MRN: [id number] DOB: 08-13-1950  Admitting Diagnosis: Infarction of left basal ganglia [facility])  Hospital Problems: Principal Problem:   Infarction of left basal ganglia (HCC) Active Problems:   Elevated BUN   Sleep disturbance     Functional Problem List: Nursing Edema,Medication Management,Motor,Sensory,Safety,Pain,Skin Integrity  PT Balance,Endurance,Motor,Safety,Perception  OT Balance,Edema,Endurance,Motor,Pain,Perception,Safety,Sensory,Vision  SLP    TR         Basic ADL's: OT Grooming,Bathing,Dressing,Toileting     Advanced  ADL's: OT       Transfers: PT Bed Mobility,Bed to Chair,Car  OT Toilet,Tub/Shower     Locomotion: PT Ambulation,Wheelchair Mobility,Stairs     Additional Impairments: OT Fuctional Use of Upper Extremity  SLP        TR      Anticipated Outcomes Item Anticipated Outcome  Self Feeding S  Swallowing      Basic self-care  S  Toileting  S   Bathroom Transfers S  Bowel/Bladder  will remain continent on while on CIR  Transfers  S transfers  Locomotion  c/s gait, c/g stairs, S w/c mobility  Communication     Cognition     Pain  will have pain managed at an acceptable level while on CIR  Safety/Judgment  will not have any falls with injury while on CIR   Therapy Plan: PT Intensity: Minimum of 1-2 x/day ,45 to 90 minutes PT Frequency: 5 out of 7 days PT Duration Estimated Length of Stay: 14 to 17 days OT Intensity: Minimum of 1-2 x/day, 45 to 90 minutes OT Frequency: 5 out of 7 days OT Duration/Estimated Length of Stay: 14-17     Due to the current state of emergency, patients may not be receiving their 3-hours of Medicare-mandated therapy.   Team Interventions: Nursing Interventions Patient/Family Education,Pain Management,Medication Management,Disease Management/Prevention,Discharge Planning,Psychosocial Support  PT interventions Ambulation/gait  training,Balance/vestibular training,Discharge planning,DME/adaptive equipment instruction,Neuromuscular re-education,Functional mobility training,Patient/family education,Stair training,Therapeutic Activities,Therapeutic Exercise,UE/LE Coordination activities,UE/LE Strength taining/ROM,Wheelchair propulsion/positioning  OT Interventions Balance/vestibular training,Discharge planning,Functional electrical stimulation,Pain management,Self Care/advanced ADL retraining,Therapeutic Activities,UE/LE Coordination activities,Therapeutic Exercise,Skin care/wound managment,Patient/family education,Functional mobility training,Disease Conservation officer, historic buildings re-education,Psychosocial support,UE/LE Strength taining/ROM,Splinting/orthotics,Wheelchair propulsion/positioning  SLP Interventions    TR Interventions    SW/CM Interventions Discharge Planning,Psychosocial Support,Patient/Family Education   Barriers to Discharge MD  Medical stability, Home enviroment access/loayout, Lack of/limited family support, Weight bearing restrictions, Medication compliance, Behavior and depression and R hemiplegia  Nursing      PT Inaccessible home environment    OT      SLP      SW       Team Discharge Planning: Destination: PT-Home ,OT- Home , SLP-  Projected Follow-up: PT-Home health PT, OT-  Outpatient OT, SLP-  Projected Equipment Needs: PT-To be determined, OT- 3 in 1 bedside comode,Tub/shower seat, SLP-  Equipment Details: PT- , OT-  Patient/family involved in discharge planning: PT- Patient,  OT-Patient, SLP-   MD ELOS: 14-17 days Medical Rehab Prognosis:  Good Assessment: Pt is a 70 yr old male with hx of gout, HTN and HLD with L basal ganglia stroke with R hemiplegia- here for rehab.  Goals supervision to CGA by d/c. Leukocytosis improved- still having Azotemia due to needing to drink more- push PO fluids.  And R wrist swelling/pain-  con't K tpaing.     See Team Conference Notes for weekly updates to the plan of care

## 2020-02-24 NOTE — Progress Notes (Signed)
Occupational Therapy Session Note  Patient Details  Name: Brent Fester Sr. MRN: [id number] Date of Birth: 12-02-1950  Today's Date: 02/24/2020 OT Individual Time: 0730-0830 OT Individual Time Calculation (min): 60 min    Short Term Goals: Week 1:  OT Short Term Goal 1 (Week 1): pt will don shirt wiht MIN A consistently OT Short Term Goal 2 (Week 1): Pt will recall hemi strategies wiht no VC OT Short Term Goal 3 (Week 1): Pt will thread BLE into pants OT Short Term Goal 4 (Week 1): Pt will compelte 2/3 components of toileting OT Short Term Goal 5 (Week 1): Pt will trasnfer to toilet wiht CGA and LRAD   Skilled Therapeutic Interventions/Progress Updates:    Pt greeted at time of session supine in bed semireclined just finished eating breakfast, agreeable to OT session. No pain. Supine to sit CGA, sit to stand Min A and stand pivot to wheelchair in same manner. Stand pivot with RW > TTB in shower with Min A overall with extended time to manage RLE and physical assist at one point. Performed UB/LB bathing with Min A overall with assist for washing LUE with hand over hand assist to wash under L armpit and NMR throughout for using LUE for bathing tasks. Sit to stand at bar and washed buttocks with CGA, using RUE for support on bar. Min A for drying off as well, donned underwear with hemidressing techniques with Min/Mod, pants in same manner once out in room. Stand pivot bench > wheelchair Min A with no AD. UB dress Min A with hemidressing technique, assist with socks and shoes for time management. Min A for oral hygiene to hold and fully squeeze toothepast tube. Pt set up in wheelchair with half lap tray, briefly reviewing AROM and AAROM techniques for pt to perform in room for RUE. Alarm on call bell in reach.   Therapy Documentation Precautions:  Precautions Precautions: Fall Precaution Comments: R hemiparesis Restrictions Weight Bearing Restrictions: No     Therapy/Group: Individual  Therapy  Viona Gilmore 02/24/2020, 7:12 AM

## 2020-02-24 NOTE — Progress Notes (Signed)
Inpatient Rehabilitation Care Coordinator Assessment and Plan Patient Details  Name: Brent Bilger Sr. MRN: [id number] Date of Birth: 10/20/1950  Today's Date: 02/24/2020  Hospital Problems: Principal Problem:   Infarction of left basal ganglia (HCC) Active Problems:   Elevated BUN   Sleep disturbance  Past Medical History:  Past Medical History:  Diagnosis Date  . Carpal tunnel syndrome   . Gout   . Hyperlipidemia    on meds  . Hypertension    on meds   Past Surgical History:  Past Surgical History:  Procedure Laterality Date  . COLONOSCOPY  2015   DJ-MAC-polyps  . LOOP RECORDER INSERTION N/A 02/19/2020   Procedure: LOOP RECORDER INSERTION;  Surgeon: Camnitz, Will Martin, MD;  Location: [facility] INVASIVE CV LAB;  Service: Cardiovascular;  Laterality: N/A;  . no prior surgery    . POLYPECTOMY  2015   DJ-MAC-polyps   Social History:  reports that he quit smoking about 54 years ago. He has never used smokeless tobacco. He reports current alcohol use. He reports that he does not use drugs.  Family / Support Systems Marital Status: Married How Long?: 35 years Patient Roles: Spouse,Parent Spouse/Significant Other: Brent Mccormick (wife): [phone] Children: 2 adult children: Brent Mccormick (son) ,and Brent Mccormick (dtr) Other Supports: Pt reports he will have support from his sisters-Brent Mccormick; Brent Mccormick (flying in from [place] to help assist); and brother Brent Mccormick. Anticipated Caregiver: wife; and sibliings Ability/Limitations of Caregiver: Pt wife works S/S/M as an RN in labor and delivery for [employer]. Pt will ahve support from siblings. 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 Education: high school Read: Yes Write: Yes Employment Status: Employed Name of Employer:  self employed Return to Work Plans: Pt would like to return to work when able Legal History/Current Legal Issues: Denies Guardian/Conservator: N/A   Abuse/Neglect Abuse/Neglect Assessment Can Be Completed: Yes Physical Abuse: Denies Verbal Abuse: Denies Sexual Abuse: Denies Exploitation of patient/patient's resources: Denies Self-Neglect: Denies  Emotional Status Pt's affect, behavior and adjustment status: Pt in good spirits at time of visit Recent Psychosocial Issues: Denies Psychiatric History: Denies Substance Abuse History: Pt admits to 3-4 beers every other day.  Patient / Family Perceptions, Expectations & Goals Pt/Family understanding of illness & functional limitations: Pt and family have general understanding of care needs Premorbid pt/family roles/activities: Independent Anticipated changes in roles/activities/participation: Assistance with ADLs/IADLs Pt/family expectations/goals: Pt goal is to get "R arm/hand working faily good."  Community Resources Community Agencies: None Premorbid Home Care/DME Agencies: None Transportation available at discharge: wife Resource referrals recommended: Neuropsychology  Discharge Planning Living Arrangements: Spouse/significant other Support Systems: Spouse/significant other,Other relatives Type of Residence: Private residence Insurance Resources: Medicare,Private Insurance (specify) ([place] [facility]) Financial Resources: Employment,Social Security,Family Support Financial Screen Referred: No Living Expenses: Own Money Management: Spouse,Patient Does the patient have any problems obtaining your medications?: No Home Management: Both manage home care needs Patient/Family Preliminary Plans: TBD Care Coordinator Anticipated Follow Up Needs: HH/OP Expected length of stay: 14-17 days  Clinical Impression SW met with pt in room to introduce self, explain role, and discuss discharge process. Pt is not a veteran. No HCPOA. No DME.  Pt reports that he is most concerned about the steps to get into the home. 8 steps with bilateral handrails (close together) from driveway; 3 wide steps at front   entrance with bilateral hand rails (far apart). Pt aware SW to follow-up with his wife.   SW spoke with pt wife Brent Mccormick ([phone]) to introduce self, explain role, and discuss discharge process. SW informed will follow-up after team conference.   Auria A Chamberlain 02/24/2020, 2:07 PM   

## 2020-02-24 NOTE — Progress Notes (Signed)
Inpatient Rehabilitation  Patient information reviewed and entered into eRehab system by Andie Mungin M. Omid Deardorff, M.A., CCC/SLP, PPS Coordinator.  Information including medical coding, functional ability and quality indicators will be reviewed and updated through discharge.    

## 2020-02-25 DIAGNOSIS — I639 Cerebral infarction, unspecified: Secondary | ICD-10-CM | POA: Diagnosis not present

## 2020-02-25 MED ORDER — CYCLOBENZAPRINE HCL 10 MG PO TABS
10.0000 mg | ORAL_TABLET | Freq: Three times a day (TID) | ORAL | Status: DC | PRN
  Administered 2020-02-26: 10 mg via ORAL
  Filled 2020-02-25: qty 1

## 2020-02-25 NOTE — Progress Notes (Signed)
Physical Therapy Session Note  Patient Details  Name: Brent Hanton Sr. MRN: [id number] Date of Birth: 05-Oct-1950  Today's Date: 02/25/2020 PT Individual Time: 0800-0900 PT Individual Time Calculation (min): 60 min   Short Term Goals: Week 1:  PT Short Term Goal 1 (Week 1): Pt will increase bed to S. PT Short Term Goal 2 (Week 1): Pt will incease transfers to min A. PT Short Term Goal 3 (Week 1): Pt will ambulate with LRAD about 50 feet with min A. PT Short Term Goal 4 (Week 1): Pt will ascend/descend 6 stairs with 1 rail and mod A. PT Short Term Goal 5 (Week 1): Pt will propel w/c about 150 feet with S.  Skilled Therapeutic Interventions/Progress Updates:    Pt received seated in w/c in room in gown, agreeable to PT session. Pt reports soreness in dorsum of feet overnight that has resolved this AM. No complaints of pain this AM. Assisted pt with doffing/donning underwear, donning pants, shirt, socks, and shoes while seated in w/c. Pt is CGA to stand to RW this session with cues for UE placement during transfer. Pt able to maintain standing balance with CGA while pulling underwear and pants up over hips, some assist needed to get up over R hip. Performed six minute walk test for endurance, pt ambulates x 349 ft with RW at min A level with no rest breaks. Pt exhibits decreased RLE clearance with onset of fatigue, is able to improve with verbal cueing. Pt also requires increased time and assist for balance with turns. Pt also performed TUG and scored 38.9 sec average with use of RW and CGA to min A for balance. Pt requires most time during TUG to complete stand to sit tranfers for RUE management and safety with transfer. Standing alt L/R cone taps with CGA to min A for balance initially with BUE support on RW progressing to just RUE on RW. Pt has several near LOB he is able to correct with min A. Pt left seated in recliner in room with needs in reach, chair alarm in place at end of session.  Therapy  Documentation Precautions:  Precautions Precautions: Fall Precaution Comments: R hemiparesis Restrictions Weight Bearing Restrictions: No Balance: Standardized Balance Assessment Standardized Balance Assessment: Timed Up and Go Test Timed Up and Go Test TUG: Normal TUG Normal TUG (seconds): 38.9    Therapy/Group: Individual Therapy   Excell Seltzer, PT, DPT  02/25/2020, 11:42 AM

## 2020-02-25 NOTE — Progress Notes (Signed)
Occupational Therapy Session Note  Patient Details  Name: Brent Eppinger Sr. MRN: [id number] Date of Birth: 08-Aug-1950  Today's Date: 02/25/2020 OT Individual Time: 1030-1100 OT Individual Time Calculation (min): 30 min    Short Term Goals: Week 1:  OT Short Term Goal 1 (Week 1): pt will don shirt wiht MIN A consistently OT Short Term Goal 2 (Week 1): Pt will recall hemi strategies wiht no VC OT Short Term Goal 3 (Week 1): Pt will thread BLE into pants OT Short Term Goal 4 (Week 1): Pt will compelte 2/3 components of toileting OT Short Term Goal 5 (Week 1): Pt will trasnfer to toilet wiht CGA and LRAD  Skilled Therapeutic Interventions/Progress Updates:    Pt resting in recliner upon arrival. OT intervention with focus on RUE strengthening/AROM and Rt hand strengthening/finger flexion/extension. Pt performed Rt shoulder flexion with overhead reaches with AAROM 3x10, horizontal adduction with AAROM 3x10, and elbow flexion 3x10. Pt remained seated in recliner with all needs within reach and seat alarm activated.  Therapy Documentation Precautions:  Precautions Precautions: Fall Precaution Comments: R hemiparesis Restrictions Weight Bearing Restrictions: No  Pain:  Pt denies pain this morning     Therapy/Group: Individual Therapy  [name] 02/25/2020, 12:07 PM

## 2020-02-25 NOTE — Patient Care Conference (Signed)
Inpatient RehabilitationTeam Conference and Plan of Care Update Date: 02/25/2020   Time: 11:12 AM    Patient Name: Brent Mccormick.      Medical Record Number: [id number]  Date of Birth: 07-Mar-1950 Sex: Male         Room/Bed: 4M10C/4M10C-01 Payor Info: Payor: [place] EMPLOYEE / Plan: [place] [employer] / Product Type: *No Product type* /    Admit Date/Time:  02/21/2020  3:33 PM  Primary Diagnosis:  Infarction of left basal ganglia [facility])  Hospital Problems: Principal Problem:   Infarction of left basal ganglia (HCC) Active Problems:   Elevated BUN   Sleep disturbance    Expected Discharge Date: Expected Discharge Date: 03/04/20  Team Members Present: Physician leading conference: Dr. Courtney Heys Care Coodinator Present: Loralee Pacas, LCSWA;Allycia Pitz Creig Hines, RN, BSN, [place] Nurse Present: Mohammed Kindle, RN PT Present: Excell Seltzer, PT OT Present: Willeen Cass, OT;Roanna Epley, COTA PPS Coordinator present : Gunnar Fusi, SLP     Current Status/Progress Goal Weekly Team Focus  Bowel/Bladder   Pt continent x2, LBM 02/23/20  Pt will remain continent x2  Q2h toileting/PRN   Swallow/Nutrition/ Hydration             ADL's   min A overall; functional transfers-min A/CGA  supervision overall  BADL retraining, functional transfers, standing balance, education, safety awareness   Mobility   CGA-min A for R hand grip for gait with RW;  CGA transfers; max A stairs on eval;  S gait and transfers, CGA stairs  NMR without UE support, gait for longer distances, LE strength   Communication             Safety/Cognition/ Behavioral Observations            Pain   Pt c/o bilateral feet pain. Pain described at sharp/stabbing pain. PRN tylenol not effective  Pt will be free of pain  Assess pain Qshift/PRN   Skin   No signs of skin breakdown noted  Pt will be free of breakdown and infection  Assess skin Qshift/PRn     Discharge Planning:  D/c to home with support from wife who will  provide intermittent support (works S/S/M), and pt states he will have 24/7 support from various family members.   Team Discussion: Pain to tops of both feet. MD added muscle relaxer. Mood doesn't appear to be great, will continue to monitor. Bun better but not great, push fluids. Continent B/B, offered Tylenol for pain, but didn't want it. Will try to offer again. Lives with wife who is a Marine scientist. Patient is self-employed. Will have good family support.  Patient on target to meet rehab goals: Contact guard to min assist, has a splint on walker but may be able to take it off. Min assist for bathing and dressing. Functional transfers are contact guard. Supervision goals. Working on hands and fingers. k-taping right hand swelling is helping.   *See Care Plan and progress notes for long and short-term goals.   Revisions to Treatment Plan:  May need to add SSRI for depression, will monitor.  Teaching Needs: Family education, medication management, transfer training, gait training, endurance training, pain management.  Current Barriers to Discharge: Inaccessible home environment, Decreased caregiver support, Home enviroment access/layout, Lack of/limited family support, Weight bearing restrictions, Medication compliance and Behavior  Possible Resolutions to Barriers: Continue current medications, manage pain, provide emotional support to patient and family.     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; weak grasp/finger abd on exam; stronger ~3/5 more proximally; good family support Possible Resolutions to  [employer] Focus: CGA-min A  transfers; goals supervision/CGA; OT- min A overall; d/c 2/16-   Continued Need for Acute Rehabilitation Level of Care: The patient requires daily medical management by a physician with specialized training in physical medicine and rehabilitation for the following reasons: Direction of a multidisciplinary physical rehabilitation program to maximize functional independence : Yes Medical management of patient stability for increased activity during participation in an intensive rehabilitation regime.: Yes Analysis of laboratory values and/or radiology reports with any subsequent need for medication adjustment and/or medical intervention. : Yes   I attest that I was present, lead the team conference, and concur with the assessment and plan of the team.   Cristi Loron 02/25/2020, 5:07 PM

## 2020-02-25 NOTE — Progress Notes (Signed)
Physical Therapy Session Note  Patient Details  Name: Brent Knierim Sr. MRN: [id number] Date of Birth: [birth year]  Today's Date: 02/25/2020 PT Individual Time: 1345-1415 PT Individual Time Calculation (min): 30 min   Short Term Goals: Week 1:  PT Short Term Goal 1 (Week 1): Pt will increase bed to S. PT Short Term Goal 2 (Week 1): Pt will incease transfers to min A. PT Short Term Goal 3 (Week 1): Pt will ambulate with LRAD about 50 feet with min A. PT Short Term Goal 4 (Week 1): Pt will ascend/descend 6 stairs with 1 rail and mod A. PT Short Term Goal 5 (Week 1): Pt will propel w/c about 150 feet with S.  Skilled Therapeutic Interventions/Progress Updates:    Patient seated in recliner and reports events of prior sessions today.  Agreeable to PT to work on balance.  Performed sit to stand with CGA to S, pt methodically using L hand on walker and R on arm of chair and utilizing coban on L handle of walker to improve L hand grip.  Patient ambulated x 150' to therapy gym with CGA.  Patient standing without UE support to tap dots on BITS screen x 42 within 1 minute on level ground, then on foam (airex), then with L foot on foam and R on floor.  Patient negotiated ramp with RW and CGA.  Then side stepping on ramp using railing up/down x 1 leading with L, then x 1 leading with R and min A cues for R hand movement.  Patient ambulated x 30' without device with CGA and min facilitation for arm swing.  Patient ambulated to room with RW and CGA.  Left in recliner with alarm active and needs in reach, NT in the room.   Therapy Documentation Precautions:  Precautions Precautions: Fall Precaution Comments: R hemiparesis Restrictions Weight Bearing Restrictions: No Pain: Pain Assessment Pain Score: 0-No pain    Therapy/Group: Individual Therapy  Reginia Naas  [place], PT 02/25/2020, 5:11 PM

## 2020-02-25 NOTE — Progress Notes (Signed)
Occupational Therapy Session Note  Patient Details  Name: Brent Bertram Sr. MRN: [id number] Date of Birth: 1950-12-11  Today's Date: 02/25/2020 OT Individual Time: 1130-1200 OT Individual Time Calculation (min): 30 min    Short Term Goals: Week 1:  OT Short Term Goal 1 (Week 1): pt will don shirt wiht MIN A consistently OT Short Term Goal 2 (Week 1): Pt will recall hemi strategies wiht no VC OT Short Term Goal 3 (Week 1): Pt will thread BLE into pants OT Short Term Goal 4 (Week 1): Pt will compelte 2/3 components of toileting OT Short Term Goal 5 (Week 1): Pt will trasnfer to toilet wiht CGA and LRAD  Skilled Therapeutic Interventions/Progress Updates:    Pt resting in recliner upon arrival. OT intervention with focus on sit<>stand, standing balance, functional amb with RW, toilet tranfsers, RUE FMC/strengthening. Sit<>stand with CGA, Amb with RW to bathroom with CGA. Toilet transfers with CGA. Standing balance at sink with CGA to wash face with RUE using wash cloth. Pt returned to recliner. [facility] tasks with small foam cubes. Pt pinched cubes and placed in cup. Pt with significant compensatory strategies (shoulder hiking) to move RUE to pick up cubes placed on leg and drop in cup on contralateral leg. Pt remained in recliner with all needs within reach and seat alarm activated.   Therapy Documentation Precautions:  Precautions Precautions: Fall Precaution Comments: R hemiparesis Restrictions Weight Bearing Restrictions: No  Pain:  Pt denies pain     Therapy/Group: Individual Therapy  [name] 02/25/2020, 12:08 PM

## 2020-02-25 NOTE — Progress Notes (Signed)
Occupational Therapy Session Note  Patient Details  Name: Brent Mccormick. MRN: [id number] Date of Birth: [birth year]  Today's Date: 02/25/2020 OT Individual Time: 1300-1345 OT Individual Time Calculation (min): 45 min    Short Term Goals: Week 1:  OT Short Term Goal 1 (Week 1): pt will don shirt wiht MIN A consistently OT Short Term Goal 2 (Week 1): Pt will recall hemi strategies wiht no VC OT Short Term Goal 3 (Week 1): Pt will thread BLE into pants OT Short Term Goal 4 (Week 1): Pt will compelte 2/3 components of toileting OT Short Term Goal 5 (Week 1): Pt will trasnfer to toilet wiht CGA and LRAD  Skilled Therapeutic Interventions/Progress Updates:    Patient seated in recliner, ready for therapy.   He is pleasant, cooperative and denies pain.   Note mild bruise on dorsum of right foot - patient notes that he had pain last night in both feet - may be due to shoes tied too tight - passed on to therapy staff (no pain at this time)    Sit to stand from recliner with CGA - able to use urinal with min A.  Ambulated to shower with CGA - shower completed seated on shower bench with hand held shower and grab bars when in stance - he requires assistance with left arm and right lower leg, CGA to steady in stance - he notes improved ability to hold and use right hand for bathing this session.   Dressing completed seated on arm chair - min A for OH shirt, mod A for underwear and pants, max A for socks and shoes.  Ambulated to sink where he completed oral care with CS in stance.  He returned to recliner CGA, hair care and applied lotion with set up.  He remained seated in recliner at close of session.  Seat alarm set and call bell in hand.    Therapy Documentation Precautions:  Precautions Precautions: Fall Precaution Comments: R hemiparesis Restrictions Weight Bearing Restrictions: No  Therapy/Group: Individual Therapy  Carlos Levering 02/25/2020, 7:46 AM

## 2020-02-25 NOTE — Progress Notes (Signed)
[facility] PHYSICAL MEDICINE & REHABILITATION PROGRESS NOTE  Subjective/Complaints:   Pt reports doing OK-  Brushing his teeth to get ready for OT.  Having a lot of pain in feet- top of feet B/L-  Like little stabbing feelings- Made it hard ot sleep- tylenol helped, but wore off and needed more overnight.   Tramadol makes him feel bad, so doesn't want that.  Would like to try a muscle relaxer. - Will try Flexeril prn.   LBM yesterday.   Feels like grip is getting a little better on R.   ROS:  Pt denies SOB, abd pain, CP, N/V/C/D, and vision changes    Objective: Vital Signs: Blood pressure (!) 143/81, pulse 72, temperature 97.6 F (36.4 C), temperature source Oral, resp. rate 18, height 5\' 10"  (1.778 m), weight 77.4 kg, SpO2 97 %. No results found. Recent Labs    02/24/20 0556  WBC 8.4  HGB 14.1  HCT 40.9  PLT 312   Recent Labs    02/24/20 0556  NA 139  K 4.2  CL 104  CO2 26  GLUCOSE 124*  BUN 22  CREATININE 1.09  CALCIUM 9.4    Intake/Output Summary (Last 24 hours) at 02/25/2020 4650 Last data filed at 02/25/2020 0740 Gross per 24 hour  Intake 775 ml  Output 575 ml  Net 200 ml        Physical Exam: BP (!) 143/81 (BP Location: Left Arm)   Pulse 72   Temp 97.6 F (36.4 C) (Oral)   Resp 18   Ht 5\' 10"  (1.778 m)   Wt 77.4 kg   SpO2 97%   BMI 24.48 kg/m  Constitutional: sitting in w/c at sink, brushing teeth, NAD HENT: Normocephalic.  Atraumatic. Eyes: EOMI. No discharge. Cardiovascular: RRR - no JVD Respiratory: CTA B/L- no W/R/R- good air movement GI: Soft, NT, ND, (+)BS   Skin: appropriate affect Musc: Mild right wrist edema and tenderness, almost resolved swelling Neuro: Ox3   Fair insight and awareness of deficits. Motor: LUE/LLE: 5/5 proximal distal RUE: 3+/5  Except R grip 2/5, and finger abd 1/5- which is improved RLE: 4+/5 proximal to distal   Assessment/Plan: 1. Functional deficits which require 3+ hours per day of  interdisciplinary therapy in a comprehensive inpatient rehab setting.  Physiatrist is providing close team supervision and 24 hour management of active medical problems listed below.  Physiatrist and rehab team continue to assess barriers to discharge/monitor patient progress toward functional and medical goals   Care Tool:  Bathing    Body parts bathed by patient: Right arm,Chest,Abdomen,Front perineal area,Right upper leg,Left upper leg,Left lower leg,Right lower leg,Face,Buttocks   Body parts bathed by helper: Left arm     Bathing assist Assist Level: Minimal Assistance - Patient > 75%     Upper Body Dressing/Undressing Upper body dressing   What is the patient wearing?: Pull over shirt    Upper body assist Assist Level: Minimal Assistance - Patient > 75%    Lower Body Dressing/Undressing Lower body dressing      What is the patient wearing?: Pants,Underwear/pull up     Lower body assist Assist for lower body dressing: Moderate Assistance - Patient 50 - 74%     Toileting Toileting    Toileting assist Assist for toileting: Maximal Assistance - Patient 25 - 49%     Transfers Chair/bed transfer  Transfers assist     Chair/bed transfer assist level: Contact Guard/Touching assist     Locomotion Ambulation  Ambulation assist      Assist level: Minimal Assistance - Patient > 75% Assistive device: Walker-rolling Max distance: 300   Walk 10 feet activity   Assist     Assist level: Minimal Assistance - Patient > 75% Assistive device: Walker-rolling   Walk 50 feet activity   Assist    Assist level: Minimal Assistance - Patient > 75% Assistive device: Walker-rolling    Walk 150 feet activity   Assist Walk 150 feet activity did not occur: Safety/medical concerns  Assist level: Minimal Assistance - Patient > 75% Assistive device: Walker-rolling    Walk 10 feet on uneven surface  activity   Assist Walk 10 feet on uneven surfaces  activity did not occur: Safety/medical concerns         Wheelchair     Assist Will patient use wheelchair at discharge?: Yes Type of Wheelchair: Manual    Wheelchair assist level: Minimal Assistance - Patient > 75% Max wheelchair distance: 75    Wheelchair 50 feet with 2 turns activity    Assist        Assist Level: Minimal Assistance - Patient > 75%   Wheelchair 150 feet activity     Assist      Assist Level: Moderate Assistance - Patient 50 - 74%    Medical Problem List and Plan: 1.  Right hemiparesis secondary to ischemic infarct left basal ganglia likely due to small vessel disease.  Status post loop recorder  Continue CIR 2.  Antithrombotics: -DVT/anticoagulation: Lovenox             -antiplatelet therapy: Aspirin 81 mg daily and Plavix 75 mg daily x3 weeks then Plavix alone 3. Pain Management: Tylenol as needed  2/8- having feet pain- will try tylenol AND Flexeril 10 mg TID prn 4. Mood: Provide emotional support  2/7- if mood doesn't improve, need to add an SSRI             -antipsychotic agents: N/A 5. Neuropsych: This patient is capable of making decisions on his own behalf. 6. Skin/Wound Care: Routine skin checks 7. Fluids/Electrolytes/Nutrition: Routine in and outs            2/8- looking good- con't regimen- Cr stable at 1.09 8.  Hyperlipidemia: Crestor 9.  History of gout.  Colchicine 0.6 mg every other day.               Monitor for flares  Controlled at present 10.  Essential hypertension.  Cozaar 100 mg daily  Mildly elevated on 2/6, monitor for persistence  2/7- BP 137/86- staying in 130s/80s- con't regimen             Monitor with increased mobility 12.  Tachycardia-likely secondary to deconditioning             ECG on 2/4 reviewed-normal  ?  Improving 13.  Leukocytosis             WBCs 11.1 on 2/4, CBC ordered for tomorrow  2/7- WBC 8.4- no signs/Sx's of infection- con't to monitor             Afebrile, no signs/symptoms of  infection   14.  Elevated BUN  2/7- BUN 22- slightly improved- con't to push PO fluids  Encourage fluids 15.  Sleep disturbance  Melatonin with benefit  2/7- "slept the best in weeks"- con't regimen  2/8- pain interfering with sleep- will add flexeril which can also make pts sleepy 16. R wrist swelling/pain  2/7- improved with  K-taping- con't regimen   LOS: 4 days A FACE TO FACE EVALUATION WAS PERFORMED  Brent Mccormick 02/25/2020, 9:21 AM

## 2020-02-25 NOTE — Progress Notes (Signed)
Patient ID: Brent Aloe Sr., male   DOB: 10-18-1950, 70 y.o.   MRN: [id number]  SW met with pt in room to provide updates from team conference, and d/c date 2/16. Pt states his wife will be in tomorrow since she was tired from previous shift at work. Pt aware SW to follow-up with his wife.   SW called pt wife [name]  [phone]) and left a message to provide updates. SW requested return phone call.    Loralee Pacas, MSW, [place] Office: [phone] Cell: [phone] Fax: [phone]

## 2020-02-25 NOTE — Plan of Care (Signed)
  Problem: RH Ambulation Goal: LTG Patient will ambulate in controlled environment (PT) Description: LTG: Patient will ambulate in a controlled environment, # of feet with assistance (PT). Flowsheets (Taken 02/25/2020 1205) LTG: Pt will ambulate in controlled environ  assist needed:: (upgrade due to progress) Supervision/Verbal cueing LTG: Ambulation distance in controlled environment: 150 ft with LRAD Note: Upgrade due to progress Goal: LTG Patient will ambulate in home environment (PT) Description: LTG: Patient will ambulate in home environment, # of feet with assistance (PT). Flowsheets (Taken 02/25/2020 1205) LTG: Pt will ambulate in home environ  assist needed:: (add goal due to progress) Supervision/Verbal cueing LTG: Ambulation distance in home environment: 75 ft with LRAD Note: Upgrade due to progress   Problem: RH Ambulation Goal: LTG Patient will ambulate in community environment (PT) Description: LTG: Patient will ambulate in community environment, # of feet with assistance (PT). Flowsheets (Taken 02/25/2020 1206) LTG: Pt will ambulate in community environ  assist needed:: (add goal due to progress) Supervision/Verbal cueing LTG: Ambulation distance in community environment: 300 ft with LRAD Note: Add goal due to progress

## 2020-02-26 ENCOUNTER — Telehealth: Payer: Self-pay | Admitting: Adult Health

## 2020-02-26 DIAGNOSIS — I639 Cerebral infarction, unspecified: Secondary | ICD-10-CM | POA: Diagnosis not present

## 2020-02-26 MED ORDER — COLCHICINE 0.6 MG PO TABS
0.6000 mg | ORAL_TABLET | Freq: Two times a day (BID) | ORAL | Status: DC
  Administered 2020-02-26 – 2020-03-04 (×15): 0.6 mg via ORAL
  Filled 2020-02-26 (×15): qty 1

## 2020-02-26 MED ORDER — INDOMETHACIN ER 75 MG PO CPCR
75.0000 mg | ORAL_CAPSULE | Freq: Two times a day (BID) | ORAL | Status: DC
  Administered 2020-02-26 – 2020-02-27 (×2): 75 mg via ORAL
  Filled 2020-02-26 (×4): qty 1

## 2020-02-26 NOTE — Progress Notes (Signed)
Physical Therapy Session Note  Patient Details  Name: Brent Propes Sr. MRN: [id number] Date of Birth: Apr 10, 1950  Today's Date: 02/26/2020 PT Individual Time: 0805-0900 and 1305-1400, and 2706-0528 PT Individual Time Calculation (min): 55 min and 55 min, and   Short Term Goals: Week 1:  PT Short Term Goal 1 (Week 1): Pt will increase bed to S. PT Short Term Goal 2 (Week 1): Pt will incease transfers to min A. PT Short Term Goal 3 (Week 1): Pt will ambulate with LRAD about 50 feet with min A. PT Short Term Goal 4 (Week 1): Pt will ascend/descend 6 stairs with 1 rail and mod A. PT Short Term Goal 5 (Week 1): Pt will propel w/c about 150 feet with S.  Skilled Therapeutic Interventions/Progress Updates: Pt presented in recliner agreeable to therapy. Pt states some increased pain in 2nd toe L foot to which pt contributes to possible gout flare up. Rest breaks provided as needed with PTA bringing w/c to rehab gym if needed. Pt ambulated to rehab gym >213ft with CGA and x 2 brief rest breaks for repositioning for correct RW placement. Performed hamstring pulls with level 3 resistance band 2 x 10, STS with LLE on 4in step for increased RLE recruitment 2 x 5, ball taps with 1lb dowel 2 x 10, chest press with 1lb dowel 2 x 10, shoulder flexion 1lb dowel 2 x 10. Pt also participated in standing on Airex and placing yellow clothespins on rods with PTA providing minA for positioning but pt able to perform small grasp and no requiring HOH assist for placing on rods. Pt ambulated back to room in same manner as prior and returned to recliner. Pt left in recliner at end of session with seat alarm on, call bell within reach and needs met.   Tx2: Pt presented in recliner agreeable to therapy. Pt states pain a bit better controlled in L foot as adjusted gout meds. Pt ambulated to rehab gym with RW and CGA with pt demonstrating improved R foot management with distance. Pt participated in dynamic balance activities  including placing single LE on 4 in step and placing blue clothespins on basketball net. Pt able to perform with CGA x 16 bilaterally. Pt also performed toe taps to 4in step without AD 2 x 10 with HHA on RUE. Pt initially with some mild posterior lean but then improved wt shifting and coordination to CGA and was able to remove RLE off block with improved control during second set.  After therapeutic rest break pt transitioned to quadruped and participated in wt shifting forward/backwards with PTA supporting RUE in extension. Pt also performed reaching with RUE in small range. Pt then transitioned to placing BUE on red physioball and performing forward reaching with L hand supporting R hand x 10 while in tall kneeling. Performed sit to tall kneeling 2 x 5. Pt required minA to return to sitting. After seated rest ambulated back to room with CGA and pt returned to recliner. Pt left seated with seat alarm on, call bell within reach and needs met.   Tx3: Pt presented in recliner with wife present agreeable to therapy. Pt ambulated to ortho gym with CGA and wife present to observe pt's current status. Session focused on R NMR via forced use. Transitioned to prone and performed hamstring curls AROM 2 x10, pt then returned to sitting and transitioned to supine and performed single leg bridge LLE 2 x 10, hamstring pulls with physioball with resisted extension 2 x10. Pt  them ambulated back to room in same manner as prior with pt demonstrating improved heel strike and toe off as compared to previous session. Pt returned to recliner and left with seat alarm on, call bell within reach and needs met.      Therapy Documentation Precautions:  Precautions Precautions: Fall Precaution Comments: R hemiparesis Restrictions Weight Bearing Restrictions: No General:   Vital Signs: Therapy Vitals Temp: 98.1 F (36.7 C) Temp Source: Oral Pulse Rate: 100 Resp: 18 BP: 122/82 Patient Position (if appropriate):  Sitting Oxygen Therapy SpO2: 99 % O2 Device: Room Air    Therapy/Group: Individual Therapy  Lorelei Heikkila  Lillionna Nabi, PTA  02/26/2020, 4:04 PM

## 2020-02-26 NOTE — Consult Note (Signed)
   [facility] CM Inpatient Consult   02/26/2020  Brent Garfinkel Sr. 1950/03/21 [id number]   Follow up: [facility] [[facility]] Patient:  [name]  Reviewed for transition of care plans.  Plan: Continue to follow progress and disposition of post rehab community follow up and support.  Natividad Brood, RN BSN [facility] Liaison  [phone] business mobile phone Toll free office [phone]  Fax number: [phone] [email or web address]

## 2020-02-26 NOTE — Telephone Encounter (Signed)
Will you be filling out paper work or specialist?

## 2020-02-26 NOTE — Progress Notes (Signed)
[facility] PHYSICAL MEDICINE & REHABILITATION PROGRESS NOTE  Subjective/Complaints:   Pt reports didn't sleep well due to severe pain in L foot (hx of gout)- 2nd toe hurts the most, but the whole foot is aching/stabbing, throbbing pain.   Had some bleeding with lovenox shot yesterday.      ROS:   Pt denies SOB, abd pain, CP, N/V/C/D, and vision changes    Objective: Vital Signs: Blood pressure (!) 147/91, pulse 74, temperature 97.8 F (36.6 C), resp. rate 18, height 5\' 10"  (1.778 m), weight 77.4 kg, SpO2 99 %. No results found. Recent Labs    02/24/20 0556  WBC 8.4  HGB 14.1  HCT 40.9  PLT 312   Recent Labs    02/24/20 0556  NA 139  K 4.2  CL 104  CO2 26  GLUCOSE 124*  BUN 22  CREATININE 1.09  CALCIUM 9.4    Intake/Output Summary (Last 24 hours) at 02/26/2020 0854 Last data filed at 02/26/2020 0317 Gross per 24 hour  Intake 270 ml  Output 625 ml  Net -355 ml        Physical Exam: BP (!) 147/91 (BP Location: Left Arm)   Pulse 74   Temp 97.8 F (36.6 C)   Resp 18   Ht 5\' 10"  (1.778 m)   Wt 77.4 kg   SpO2 99%   BMI 24.48 kg/m  Constitutional: sitting up in bedside chair, appropriate, NAD HENT: Normocephalic.  Atraumatic. Eyes: EOMI. No discharge. Cardiovascular: RRR- no JVD Respiratory: CTA B/L- no W/R/R- good air movement GI: Soft, NT, ND, (+)BS  Psych: appropriate affect, but appears slightly more depressed today Musc: Mild right wrist edema and tenderness, almost resolved swelling Swelling/redness of L 2nd toe-  Neuro: Ox3 Motor: LUE/LLE: 5/5 proximal distal RUE: 3+/5  Except R grip 2/5, and finger abd 1/5- grip still 2/5, but slightly better today RLE: 4+/5 proximal to distal   Assessment/Plan: 1. Functional deficits which require 3+ hours per day of interdisciplinary therapy in a comprehensive inpatient rehab setting.  Physiatrist is providing close team supervision and 24 hour management of active medical problems listed  below.  Physiatrist and rehab team continue to assess barriers to discharge/monitor patient progress toward functional and medical goals   Care Tool:  Bathing    Body parts bathed by patient: Right arm,Chest,Abdomen,Front perineal area,Right upper leg,Left upper leg,Left lower leg,Right lower leg,Face,Buttocks,Left arm   Body parts bathed by helper: Left arm,Right lower leg     Bathing assist Assist Level: Minimal Assistance - Patient > 75%     Upper Body Dressing/Undressing Upper body dressing   What is the patient wearing?: Pull over shirt    Upper body assist Assist Level: Minimal Assistance - Patient > 75%    Lower Body Dressing/Undressing Lower body dressing      What is the patient wearing?: Underwear/pull up,Pants     Lower body assist Assist for lower body dressing: Moderate Assistance - Patient 50 - 74%     Toileting Toileting    Toileting assist Assist for toileting: Minimal Assistance - Patient > 75%     Transfers Chair/bed transfer  Transfers assist     Chair/bed transfer assist level: Contact Guard/Touching assist     Locomotion Ambulation   Ambulation assist      Assist level: Minimal Assistance - Patient > 75% Assistive device: Walker-rolling Max distance: 349'   Walk 10 feet activity   Assist     Assist level: Minimal Assistance - Patient >  75% Assistive device: Walker-rolling   Walk 50 feet activity   Assist    Assist level: Minimal Assistance - Patient > 75% Assistive device: Walker-rolling    Walk 150 feet activity   Assist Walk 150 feet activity did not occur: Safety/medical concerns  Assist level: Minimal Assistance - Patient > 75% Assistive device: Walker-rolling    Walk 10 feet on uneven surface  activity   Assist Walk 10 feet on uneven surfaces activity did not occur: Safety/medical concerns         Wheelchair     Assist Will patient use wheelchair at discharge?: Yes Type of Wheelchair:  Manual    Wheelchair assist level: Minimal Assistance - Patient > 75% Max wheelchair distance: 75    Wheelchair 50 feet with 2 turns activity    Assist        Assist Level: Minimal Assistance - Patient > 75%   Wheelchair 150 feet activity     Assist      Assist Level: Moderate Assistance - Patient 50 - 74%    Medical Problem List and Plan: 1.  Right hemiparesis secondary to ischemic infarct left basal ganglia likely due to small vessel disease.  Status post loop recorder  Continue CIR 2.  Antithrombotics: -DVT/anticoagulation: Lovenox             -antiplatelet therapy: Aspirin 81 mg daily and Plavix 75 mg daily x3 weeks then Plavix alone 3. Pain Management: Tylenol as needed  2/8- having feet pain- will try tylenol AND Flexeril 10 mg TID prn 4. Mood: Provide emotional support  2/7- if mood doesn't improve, need to add an SSRI  2/9- staff don't feel needs SSRI at this time-con't to monitor             -antipsychotic agents: N/A 5. Neuropsych: This patient is capable of making decisions on his own behalf. 6. Skin/Wound Care: Routine skin checks 7. Fluids/Electrolytes/Nutrition: Routine in and outs            2/8- looking good- con't regimen- Cr stable at 1.09 8.  Hyperlipidemia: Crestor 9.  History of gout.  Colchicine 0.6 mg every other day.               Monitor for flares  2/9- GOUT FLARE- will change colchicine to 0.6 mg BID and add Indomethacin 75 mg BID x 4 days- if doesn't work, might require steroids.   Controlled at present 10.  Essential hypertension.  Cozaar 100 mg daily  Mildly elevated on 2/6, monitor for persistence  2/7- BP 137/86- staying in 130s/80s- con't regimen             Monitor with increased mobility 12.  Tachycardia-likely secondary to deconditioning             ECG on 2/4 reviewed-normal  ?  Improving 13.  Leukocytosis             WBCs 11.1 on 2/4, CBC ordered for tomorrow  2/7- WBC 8.4- no signs/Sx's of infection- con't to monitor              Afebrile, no signs/symptoms of infection   14.  Elevated BUN  2/7- BUN 22- slightly improved- con't to push PO fluids  2/9- will recheck tomorrow  Encourage fluids 15.  Sleep disturbance  Melatonin with benefit  2/7- "slept the best in weeks"- con't regimen  2/8- pain interfering with sleep- will add flexeril which can also make pts sleepy  2/9- slept poorly due  to Gout pain- con't regimen and monitor 16. R wrist swelling/pain  2/7- improved with K-taping- con't regimen   LOS: 5 days A FACE TO FACE EVALUATION WAS PERFORMED  Brent Mccormick 02/26/2020, 8:54 AM

## 2020-02-26 NOTE — Progress Notes (Signed)
Occupational Therapy Session Note  Patient Details  Name: Brent Mccormick. MRN: [id number] Date of Birth: Feb 12, 1950  Today's Date: 02/26/2020 OT Individual Time: 0700-0800 OT Individual Time Calculation (min): 60 min    Short Term Goals: Week 1:  OT Short Term Goal 1 (Week 1): pt will don shirt wiht MIN A consistently OT Short Term Goal 2 (Week 1): Pt will recall hemi strategies wiht no VC OT Short Term Goal 3 (Week 1): Pt will thread BLE into pants OT Short Term Goal 4 (Week 1): Pt will compelte 2/3 components of toileting OT Short Term Goal 5 (Week 1): Pt will trasnfer to toilet wiht CGA and LRAD  Skilled Therapeutic Interventions/Progress Updates:    Pt resting in bed upon arrival eating breakfast. Initial focus on RUE use during self feeding. Pt uses RUE as gross assist with opening water bottle and containers. Focus shifted to dressing tasks seated EOB and seated in recliner. Pt donned shirt with supervision. Pt donned pants with min A threading RLE. Pt able to don socks using compensatory technique. Pt amb with RW (CGA) to sink to complete grooming tasks. Pt returned to recliner and doffed nonslip socks and donned regular socks and shoes. Pt required assistance fastening shoes. Pt remained in recliner with all needs within reach and seat alarm activated.   Therapy Documentation Precautions:  Precautions Precautions: Fall Precaution Comments: R hemiparesis Restrictions Weight Bearing Restrictions: No   Pain:  Pt c/o Lt toe pain; RN and MD aware   Therapy/Group: Individual Therapy  [name] 02/26/2020, 12:05 PM

## 2020-02-26 NOTE — Telephone Encounter (Signed)
Patient's wife dropped off FMLA paperwork For [place] to fill out. She is requesting that we fax them to the number provided on the paperwork.  The paperwork has been placed in Cory's folder.

## 2020-02-26 NOTE — Progress Notes (Signed)
Patient ID: Brent Aloe Sr., male   DOB: 1950/01/22, 70 y.o.   MRN: [id number]  SW returned phone call to pt wife [name] [phone]) to provide updates from team conference, and d/c date 2/16. Family education Friday, 2/11 10am-12pm. SW informed will proivde updates once there is more information on d/c recommendations.   Loralee Pacas, MSW, Secretary Office: [phone] Cell: [phone] Fax: [phone]

## 2020-02-27 DIAGNOSIS — I639 Cerebral infarction, unspecified: Secondary | ICD-10-CM | POA: Diagnosis not present

## 2020-02-27 LAB — BASIC METABOLIC PANEL
Anion gap: 12 (ref 5–15)
BUN: 26 mg/dL — ABNORMAL HIGH (ref 8–23)
CO2: 22 mmol/L (ref 22–32)
Calcium: 9.1 mg/dL (ref 8.9–10.3)
Chloride: 102 mmol/L (ref 98–111)
Creatinine, Ser: 1.16 mg/dL (ref 0.61–1.24)
GFR, Estimated: >60 mL/min (ref >60)
Glucose, Bld: 126 mg/dL — ABNORMAL HIGH (ref 70–99)
Potassium: 4.4 mmol/L (ref 3.5–5.1)
Sodium: 136 mmol/L (ref 135–145)

## 2020-02-27 NOTE — Progress Notes (Signed)
Occupational Therapy Session Note  Patient Details  Name: Brent Dunnavant Sr. MRN: [id number] Date of Birth: 19-Jan-1950  Today's Date: 02/27/2020 OT Individual Time: 1030-1130 OT Individual Time Calculation (min): 60 min    Short Term Goals: Week 1:  OT Short Term Goal 1 (Week 1): pt will don shirt wiht MIN A consistently OT Short Term Goal 2 (Week 1): Pt will recall hemi strategies wiht no VC OT Short Term Goal 3 (Week 1): Pt will thread BLE into pants OT Short Term Goal 4 (Week 1): Pt will compelte 2/3 components of toileting OT Short Term Goal 5 (Week 1): Pt will trasnfer to toilet wiht CGA and LRAD  Skilled Therapeutic Interventions/Progress Updates:    Pt resting in recliner upon arrival. Pt declined shower and stated he wanted to "get to work." OT intervention with focus on functional amb with RW and RUE/hand function/strengthening. Pt amb with RW from room to ADL apartment. Pt practiced furniture transfers with [place]. Bed mobility with supervision. Pt also practiced walk-in shower transfers with CGA after demonstration. Pt amb with RW to gym and engaged in table tasks with colored clothes pins. Pt grasped red and green clothes pins and placed on dowel. Emphasis on isolated muscle activity and technique. Pt able to grasp clothes pins but tended to hike his Rt shoulder when placing on dowel. Discussed importance of proper technique. Pt amb with RW back to room and returned to recliner. Pt reamined in recliner with all needs within reach and seat alarm activated.   Therapy Documentation Precautions:  Precautions Precautions: Fall Precaution Comments: R hemiparesis Restrictions Weight Bearing Restrictions: No   Pain:  Pt stated his Lt toe was feeling "much better"   Therapy/Group: Individual Therapy  [name] 02/27/2020, 12:23 PM

## 2020-02-27 NOTE — Evaluation (Signed)
Recreational Therapy Assessment and Plan  Patient Details  Name: Brent Noxon Sr. MRN: [id number] Date of Birth: 1950-10-31 Today's Date: 02/27/2020  Rehab Potential:  Good ELOS:   d/c 2/16  Assessment  Hospital Problem: Principal Problem:   Infarction of left basal ganglia [facility])   Past Medical History:      Past Medical History:  Diagnosis Date  . Carpal tunnel syndrome   . Gout   . Hyperlipidemia    on meds  . Hypertension    on meds   Past Surgical History:       Past Surgical History:  Procedure Laterality Date  . COLONOSCOPY  2015   DJ-MAC-polyps  . LOOP RECORDER INSERTION N/A 02/19/2020   Procedure: LOOP RECORDER INSERTION;  Surgeon: Constance Haw, MD;  Location: [facility];  Service: Cardiovascular;  Laterality: N/A;  . no prior surgery    . POLYPECTOMY  2015   DJ-MAC-polyps    Assessment & Plan Clinical Impression: Patientis a 70 year old right-handed male with history of gout, type 2 diabetes mellitus, hypertension, hyperlipidemia, quit smoking 54 years ago. History taken from chart review and patient. Patient lives with spouse. Wife is an Therapist, sports. 1 level home 5 steps to entry. Independent prior to admission and active. He presented on 02/16/2020 with right hemiparesis and facial droop. Head CT unremarkable for acute intracranial process. Chronic infarct right corona radiata. CT angiogram of head and neck negative for large vessel occlusion. Patient did receive TPA. MRI showed acute infarction left basal ganglia and radiating white matter tracts. No mass-effect or hemorrhage. Echocardiogram with ejection fraction of 60-65%, no wall motion abnormalities grade 1 diastolic dysfunction. Admission chemistries unremarkable except BUN 24 creatinine 1.41, WBC 10,800. Neurology consulted maintain on aspirin 81 mg daily and Plavix 75 mg daily x3 weeks then Plavix alone. Patient did undergo a loop recorder placement per cardiology  services 02/19/2020. Lovenox for DVT prophylaxis. Patient transferred to CIR on 02/21/2020 .   Pt presents with decreased activity tolerance, decreased functional mobility, decreased balance, decreased coordination Limiting pt's independence with leisure/community pursuits.  Met with pt today to discuss community, leisure and social interests, activity analysis/potential modifications, coping strategies.  Discussed the importance of holistic health with emphasis on social, emotional and spiritual in addition to physical and their impact on one another throughout the rehab process.  Pt stated understanding.  Plan  No further TR as pt is expectred to d/c 2/16  Recommendations for other services: None   Discharge Criteria: Patient will be discharged from TR if patient refuses treatment 3 consecutive times without medical reason.  If treatment goals not met, if there is a change in medical status, if patient makes no progress towards goals or if patient is discharged from hospital.  The above assessment, treatment plan, treatment alternatives and goals were discussed and mutually agreed upon: by patient  [place] 02/27/2020, 3:34 PM

## 2020-02-27 NOTE — Progress Notes (Signed)
[facility] PHYSICAL MEDICINE & REHABILITATION PROGRESS NOTE  Subjective/Complaints:   Pt reports gout Sx's MUCH better- still red, swollen, but pain MUCH better controlled with current Colchicine/Indomethacin regimen-  Also feels like moving fingers better on R hand this AM.  Slept MUCH better with pain controlled.  Also thinks moving better in bed today.   ROS:   Pt denies SOB, abd pain, CP, N/V/C/D, and vision changes   Objective: Vital Signs: Blood pressure 135/80, pulse 66, temperature 97.8 F (36.6 C), temperature source Oral, resp. rate 18, height 5\' 10"  (1.778 m), weight 77.4 kg, SpO2 97 %. No results found. No results for input(s): WBC, HGB, HCT, PLT in the last 72 hours. Recent Labs    02/27/20 0522  NA 136  K 4.4  CL 102  CO2 22  GLUCOSE 126*  BUN 26*  CREATININE 1.16  CALCIUM 9.1    Intake/Output Summary (Last 24 hours) at 02/27/2020 1023 Last data filed at 02/27/2020 0859 Gross per 24 hour  Intake 780 ml  Output 625 ml  Net 155 ml        Physical Exam: BP 135/80 (BP Location: Left Arm)   Pulse 66   Temp 97.8 F (36.6 C) (Oral)   Resp 18   Ht 5\' 10"  (1.778 m)   Wt 77.4 kg   SpO2 97%   BMI 24.48 kg/m  Constitutional: sitting up in bed; appropriate, watching TV, NAD HENT: Normocephalic.  Atraumatic. Eyes: EOMI. No discharge. Cardiovascular: RRR- no JVD Respiratory: CTA B/L- no W/R/R- good air movement GI: Soft, NT, ND, (+)BS  Psych: appropriate, but is brighter today Musc: Mild right wrist edema and tenderness, almost resolved swelling L 2nd toe- very red, a little hotter than surrounding toes, and somewhat swollen- much less TTP Neuro: Ox3 Motor: LUE/LLE: 5/5 proximal distal RUE: 3+/5  Except R grip 2/5, and finger abd 1/5- grip still 2/5, moving fingers much better- mainly flexion, but can also extend with time.  RLE: 4+/5 proximal to distal   Assessment/Plan: 1. Functional deficits which require 3+ hours per day of interdisciplinary  therapy in a comprehensive inpatient rehab setting.  Physiatrist is providing close team supervision and 24 hour management of active medical problems listed below.  Physiatrist and rehab team continue to assess barriers to discharge/monitor patient progress toward functional and medical goals   Care Tool:  Bathing    Body parts bathed by patient: Right arm,Chest,Abdomen,Front perineal area,Right upper leg,Left upper leg,Left lower leg,Right lower leg,Face,Buttocks,Left arm   Body parts bathed by helper: Left arm,Right lower leg     Bathing assist Assist Level: Minimal Assistance - Patient > 75%     Upper Body Dressing/Undressing Upper body dressing   What is the patient wearing?: Pull over shirt    Upper body assist Assist Level: Supervision/Verbal cueing    Lower Body Dressing/Undressing Lower body dressing      What is the patient wearing?: Pants     Lower body assist Assist for lower body dressing: Minimal Assistance - Patient > 75%     Toileting Toileting    Toileting assist Assist for toileting: Minimal Assistance - Patient > 75%     Transfers Chair/bed transfer  Transfers assist     Chair/bed transfer assist level: Contact Guard/Touching assist     Locomotion Ambulation   Ambulation assist      Assist level: Minimal Assistance - Patient > 75% Assistive device: Walker-rolling Max distance: 349'   Walk 10 feet activity   Assist  Assist level: Minimal Assistance - Patient > 75% Assistive device: Walker-rolling   Walk 50 feet activity   Assist    Assist level: Minimal Assistance - Patient > 75% Assistive device: Walker-rolling    Walk 150 feet activity   Assist Walk 150 feet activity did not occur: Safety/medical concerns  Assist level: Minimal Assistance - Patient > 75% Assistive device: Walker-rolling    Walk 10 feet on uneven surface  activity   Assist Walk 10 feet on uneven surfaces activity did not occur:  Safety/medical concerns         Wheelchair     Assist Will patient use wheelchair at discharge?: Yes Type of Wheelchair: Manual    Wheelchair assist level: Minimal Assistance - Patient > 75% Max wheelchair distance: 75    Wheelchair 50 feet with 2 turns activity    Assist        Assist Level: Minimal Assistance - Patient > 75%   Wheelchair 150 feet activity     Assist      Assist Level: Moderate Assistance - Patient 50 - 74%    Medical Problem List and Plan: 1.  Right hemiparesis secondary to ischemic infarct left basal ganglia likely due to small vessel disease.  Status post loop recorder  Continue CIR 2.  Antithrombotics: -DVT/anticoagulation: Lovenox             -antiplatelet therapy: Aspirin 81 mg daily and Plavix 75 mg daily x3 weeks then Plavix alone 3. Pain Management: Tylenol as needed  2/8- having feet pain- will try tylenol AND Flexeril 10 mg TID prn 4. Mood: Provide emotional support  2/7- if mood doesn't improve, need to add an SSRI  2/9- staff don't feel needs SSRI at this time-con't to monitor             -antipsychotic agents: N/A 5. Neuropsych: This patient is capable of making decisions on his own behalf. 6. Skin/Wound Care: Routine skin checks 7. Fluids/Electrolytes/Nutrition: Routine in and outs            2/8- looking good- con't regimen- Cr stable at 1.09 8.  Hyperlipidemia: Crestor 9.  History of gout.  Colchicine 0.6 mg every other day.               Monitor for flares  2/9- GOUT FLARE- will change colchicine to 0.6 mg BID and add Indomethacin 75 mg BID x 4 days- if doesn't work, might require steroids.   2/10- pain much better- will con't current regimen since working.   Controlled at present 10.  Essential hypertension.  Cozaar 100 mg daily  Mildly elevated on 2/6, monitor for persistence  2/7- BP 137/86- staying in 130s/80s- con't regimen  2/10- BP 130s/80- doing great- con't regimen             Monitor with increased  mobility 12.  Tachycardia-likely secondary to deconditioning             ECG on 2/4 reviewed-normal  ?  Improving 13.  Leukocytosis             WBCs 11.1 on 2/4, CBC ordered for tomorrow  2/7- WBC 8.4- no signs/Sx's of infection- con't to monitor             Afebrile, no signs/symptoms of infection   14.  Elevated BUN  2/7- BUN 22- slightly improved- con't to push PO fluids  2/9- will recheck tomorrow  2/10- BUN up to 26 from 22- have to really push PO-  or will need IVFs.   Encourage fluids 15.  Sleep disturbance  Melatonin with benefit  2/7- "slept the best in weeks"- con't regimen  2/8- pain interfering with sleep- will add flexeril which can also make pts sleepy  2/9- slept poorly due to Gout pain- con't regimen and monitor 16. R wrist swelling/pain  2/7- improved with K-taping- con't regimen   LOS: 6 days A FACE TO FACE EVALUATION WAS PERFORMED  Alissah Redmon 02/27/2020, 10:23 AM

## 2020-02-27 NOTE — Progress Notes (Signed)
Physical Therapy Session Note  Patient Details  Name: Brent Hallstrom Sr. MRN: [id number] Date of Birth: 04-16-1950  Today's Date: 02/27/2020 PT Individual Time: 0906-1003 and 1350-1430 PT Individual Time Calculation (min): 57 min and 40 min  Short Term Goals: Week 1:  PT Short Term Goal 1 (Week 1): Pt will increase bed to S. PT Short Term Goal 2 (Week 1): Pt will incease transfers to min A. PT Short Term Goal 3 (Week 1): Pt will ambulate with LRAD about 50 feet with min A. PT Short Term Goal 4 (Week 1): Pt will ascend/descend 6 stairs with 1 rail and mod A. PT Short Term Goal 5 (Week 1): Pt will propel w/c about 150 feet with S.  Skilled Therapeutic Interventions/Progress Updates:Tx1:  Pt presented in hallway hand off from [facility] and agreeable to therapy. Pt states toe still inflamed but not as painful as yesterday. Pt ambulated to rehab gym with CGA and demonstrated good equal step length and improving R foot clearance. Pt participated in seated ball toss for NMR via forced use of RUE. Pt also participated in use of rebounder tossing 2 x 15 using BUE. Participated in standing NMR stepping over hockey stick with RLE without AD. Pt requiring increased time when stepping backwards over stick to allow clearance. Pt also participated in four square activities stepping clockwise and counter-clockwise requiring CGA 2 x 4. Pt with most difficulty when adducting with RLE on first 2 rounds but was improved with second 2 rounds. Participated in gait training without AD CGA x 47f, and 1061fas well as sidestepping and backwards walking 2059f 4. Pt requiring cues with sidestepping to clear B feet with activity and to increase BOS as well as attempt step through when walking backwards. Pt with x2 mild LOB with backwards walking requiring minA from PTA for correction. Pt ambulated back to room with RW with CGA and pt requesting to sit in recliner. Pt left in recliner at end of session with seat alarm on, call  bell within reach and needs met.   Tx2: Pt presented in recliner agreeable to therapy. Pt denies pain during session. Session focused on ambulation in community setting. Pt performed ambulatory transfer to w/c and [facility] Pt ambulated [id number] with RW and CGA. Pt demonstrated good safety with negotiating inclines/slopes and uneven surfaces with pt intermittently unable to clear ground with R foot but able to correct. Pt then ambulated additional 200ft 28fugh entrance with pt's RW catching on threshold rug requiring assistance from PTA to clear rug. During rest breaks PTA provided education on energy conservation in community setting and in home environment. Pt transported back to room at end of session and performed stand pivot transfer to recliner with supervision. Pt left in recliner at end of session with seat alarm on, call bell within reach and needs met.      Therapy Documentation Precautions:  Precautions Precautions: Fall Precaution Comments: R hemiparesis Restrictions Weight Bearing Restrictions: No General:      Therapy/Group: Individual Therapy  Noam Karaffa  Maven Varelas, PTA  02/27/2020, 12:35 PM

## 2020-02-27 NOTE — Progress Notes (Signed)
Physical Therapy Session Note  Patient Details  Name: Brent Wickham Sr. MRN: [id number] Date of Birth: [birth year]  Today's Date: 02/27/2020 PT Individual Time: 0842-0905 PT Individual Time Calculation (min): 23 min   Short Term Goals: Week 1:  PT Short Term Goal 1 (Week 1): Pt will increase bed to S. PT Short Term Goal 2 (Week 1): Pt will incease transfers to min A. PT Short Term Goal 3 (Week 1): Pt will ambulate with LRAD about 50 feet with min A. PT Short Term Goal 4 (Week 1): Pt will ascend/descend 6 stairs with 1 rail and mod A. PT Short Term Goal 5 (Week 1): Pt will propel w/c about 150 feet with S.  Skilled Therapeutic Interventions/Progress Updates:    Patient received sitting up in bed, agreeable to PT. He denies pain when asked. L 2nd toe remains swollen and red at this time. He was able to come sit edge of bed with CGA and don clean shirt with supervision. ModA to don pants sitting edge of bed with CGA to come to stand and complete donning pants. TotalA to don shoes/socks seated. He ambulated to sink to complete morning ADLs with set up assist and supervision. He was able to ambulate 137ft with supervision/CGA and RW. Handoff to PTA in hallway for next session.   Therapy Documentation Precautions:  Precautions Precautions: Fall Precaution Comments: R hemiparesis Restrictions Weight Bearing Restrictions: No    Therapy/Group: Individual Therapy  Karoline Caldwell, PT, DPT, CBIS  02/27/2020, 7:42 AM

## 2020-02-28 NOTE — Progress Notes (Signed)
[facility] PHYSICAL MEDICINE & REHABILITATION PROGRESS NOTE  Subjective/Complaints:  Gout Pain is better- held his indomethacin due to tearing of stomach and causing diarrhea.   LBM last night- loose stool.    R hand got taped yesterday.    ROS:   Pt denies SOB, abd pain, CP, N/V/C/D, and vision changes     Objective: Vital Signs: Blood pressure 131/82, pulse 64, temperature 97.8 F (36.6 C), resp. rate 20, height 5\' 10"  (1.778 m), weight 77.4 kg, SpO2 97 %. No results found. No results for input(s): WBC, HGB, HCT, PLT in the last 72 hours. Recent Labs    02/27/20 0522  NA 136  K 4.4  CL 102  CO2 22  GLUCOSE 126*  BUN 26*  CREATININE 1.16  CALCIUM 9.1    Intake/Output Summary (Last 24 hours) at 02/28/2020 0827 Last data filed at 02/28/2020 0600 Gross per 24 hour  Intake 370 ml  Output 375 ml  Net -5 ml        Physical Exam: BP 131/82 (BP Location: Left Arm)   Pulse 64   Temp 97.8 F (36.6 C)   Resp 20   Ht 5\' 10"  (1.778 m)   Wt 77.4 kg   SpO2 97%   BMI 24.48 kg/m  Constitutional: sitting up in bed; appropriate, NAD HENT: Normocephalic.  Atraumatic. Eyes: EOMI. No discharge. Cardiovascular: RRR- no JVD Respiratory: CTA B/L- no W/R/R- good air movement GI: Soft, NT, ND, (+)BS  Psych: appropriate, but is brighter today Musc: Mild right wrist edema and tenderness, almost resolved swelling L 2nd toe- less red; less swollen; less TTP  Neuro: Ox3; MAS of 1 in RUE- wrist, elbow and shoulder Motor: LUE/LLE: 5/5 proximal distal RUE: 3+/5  Except R grip 2/5, and finger abd 1/5- grip still 2/5, moving fingers much better- mainly flexion, but can also extend with time.  RLE: 4+/5 proximal to distal   Assessment/Plan: 1. Functional deficits which require 3+ hours per day of interdisciplinary therapy in a comprehensive inpatient rehab setting.  Physiatrist is providing close team supervision and 24 hour management of active medical problems listed  below.  Physiatrist and rehab team continue to assess barriers to discharge/monitor patient progress toward functional and medical goals   Care Tool:  Bathing    Body parts bathed by patient: Right arm,Chest,Abdomen,Front perineal area,Right upper leg,Left upper leg,Left lower leg,Right lower leg,Face,Buttocks,Left arm   Body parts bathed by helper: Left arm,Right lower leg     Bathing assist Assist Level: Minimal Assistance - Patient > 75%     Upper Body Dressing/Undressing Upper body dressing   What is the patient wearing?: Pull over shirt    Upper body assist Assist Level: Supervision/Verbal cueing    Lower Body Dressing/Undressing Lower body dressing      What is the patient wearing?: Pants     Lower body assist Assist for lower body dressing: Minimal Assistance - Patient > 75%     Toileting Toileting    Toileting assist Assist for toileting: Minimal Assistance - Patient > 75%     Transfers Chair/bed transfer  Transfers assist     Chair/bed transfer assist level: Contact Guard/Touching assist     Locomotion Ambulation   Ambulation assist      Assist level: Contact Guard/Touching assist Assistive device: Walker-rolling Max distance: 150   Walk 10 feet activity   Assist     Assist level: Contact Guard/Touching assist Assistive device: Walker-rolling   Walk 50 feet activity  Assist    Assist level: Contact Guard/Touching assist Assistive device: Walker-rolling    Walk 150 feet activity   Assist Walk 150 feet activity did not occur: Safety/medical concerns  Assist level: Contact Guard/Touching assist Assistive device: Walker-rolling    Walk 10 feet on uneven surface  activity   Assist Walk 10 feet on uneven surfaces activity did not occur: Safety/medical concerns         Wheelchair     Assist Will patient use wheelchair at discharge?: Yes Type of Wheelchair: Manual    Wheelchair assist level: Minimal Assistance  - Patient > 75% Max wheelchair distance: 75    Wheelchair 50 feet with 2 turns activity    Assist        Assist Level: Minimal Assistance - Patient > 75%   Wheelchair 150 feet activity     Assist      Assist Level: Moderate Assistance - Patient 50 - 74%    Medical Problem List and Plan: 1.  Right hemiparesis secondary to ischemic infarct left basal ganglia likely due to small vessel disease.  Status post loop recorder  Continue CIR 2.  Antithrombotics: -DVT/anticoagulation: Lovenox             -antiplatelet therapy: Aspirin 81 mg daily and Plavix 75 mg daily x3 weeks then Plavix alone 3. Pain Management: Tylenol as needed  2/8- having feet pain- will try tylenol AND Flexeril 10 mg TID prn 4. Mood: Provide emotional support  2/7- if mood doesn't improve, need to add an SSRI  2/9- staff don't feel needs SSRI at this time-con't to monitor             -antipsychotic agents: N/A 5. Neuropsych: This patient is capable of making decisions on his own behalf. 6. Skin/Wound Care: Routine skin checks 7. Fluids/Electrolytes/Nutrition: Routine in and outs            2/8- looking good- con't regimen- Cr stable at 1.09 8.  Hyperlipidemia: Crestor 9.  History of gout.  Colchicine 0.6 mg every other day.               Monitor for flares  2/9- GOUT FLARE- will change colchicine to 0.6 mg BID and add Indomethacin 75 mg BID x 4 days- if doesn't work, might require steroids.   2/10- pain much better- will con't current regimen since working.  2/11- pain better- but wants indomethacin stopped since bothers stomach  Controlled at present 10.  Essential hypertension.  Cozaar 100 mg daily  Mildly elevated on 2/6, monitor for persistence  2/7- BP 137/86- staying in 130s/80s- con't regimen  2/10- BP 130s/80- doing great- con't regimen             Monitor with increased mobility 12.  Tachycardia-likely secondary to deconditioning             ECG on 2/4 reviewed-normal  ?  Improving 13.   Leukocytosis             WBCs 11.1 on 2/4, CBC ordered for tomorrow  2/7- WBC 8.4- no signs/Sx's of infection- con't to monitor             Afebrile, no signs/symptoms of infection   14.  Elevated BUN  2/7- BUN 22- slightly improved- con't to push PO fluids  2/9- will recheck tomorrow  2/10- BUN up to 26 from 22- have to really push PO- or will need IVFs.   Encourage fluids 15.  Sleep disturbance  Melatonin with  benefit  2/7- "slept the best in weeks"- con't regimen  2/8- pain interfering with sleep- will add flexeril which can also make pts sleepy  2/9- slept poorly due to Gout pain- con't regimen and monitor  2/11- sleeping better with improved pain 16. R wrist swelling/pain  2/7- improved with K-taping- con't regimen 17/ Spasticity  2/11- went over spasticity- how it occurs, prognosis, etc- won't treat unless painful or affects function- will monitor-   I spent a total of 35 minutes on pt- >50% coordination of care due to discussing spasticity.   LOS: 7 days A FACE TO FACE EVALUATION WAS PERFORMED  Brent Mccormick 02/28/2020, 8:27 AM

## 2020-02-28 NOTE — Progress Notes (Signed)
Occupational Therapy Session Note  Patient Details  Name: Brent Quattrone Sr. MRN: [id number] Date of Birth: 1950/10/11  Today's Date: 02/28/2020 OT Individual Time: 1000-1100 OT Individual Time Calculation (min): 60 min    Short Term Goals: Week 2:  OT Short Term Goal 2 (Week 2): STG=LTG 2/2 ELOS  Skilled Therapeutic Interventions/Progress Updates:    Pt's wife present for education. Pt amb with RW to shower and completed bathing at shower level with CGA when standing. Pt using RUE to assist in bathing tasks. Pt returned to room and completed dressing seated EOB. Pt required CGA for standing balance. Pt able to fasten shoes this morning without assistance. Pt practiced walk-in shower transfers. Pt's wife provided information on shower seats. Recommended OPOT. Pt and wife in agreement. CSW notified. Pt remained seated in recliner with all needs within reach. Wife present. Seat alarm activated.   Therapy Documentation Precautions:  Precautions Precautions: Fall Precaution Comments: R hemiparesis Restrictions Weight Bearing Restrictions: No    Pain:  Pt stated his toe on Lt foot continues to improve  Therapy/Group: Individual Therapy  [name] 02/28/2020, 11:09 AM

## 2020-02-28 NOTE — Progress Notes (Signed)
Occupational Therapy Weekly Progress Note  Patient Details  Name: Brent Trefz Sr. MRN: [id number] Date of Birth: Oct 02, 1950  Beginning of progress report period: February 22, 2020 End of progress report period: February 28, 2020  Patient has met 4 of 4 short term goals. Pt made excellent progress with BADLs and functional transfers since admission. Pt currently completes bathing/dressing tasks with CGA overall and min A to don shoes/fasten. Pt uses compensatory strategies during bathing/dressing tasks. Functional transfers and functional amb with RW at CGA/supervision. Pt's wife is scheduled for family education 2/11.  Patient continues to demonstrate the following deficits: muscle weakness, decreased cardiorespiratoy endurance, impaired timing and sequencing, abnormal tone, unbalanced muscle activation, motor apraxia and decreased coordination, decreased visual acuity, decreased motor planning, decreased initiation, decreased attention, decreased awareness, decreased problem solving, decreased safety awareness and decreased memory and decreased sitting balance, decreased standing balance, decreased postural control, hemiplegia and decreased balance strategies and therefore will continue to benefit from skilled OT intervention to enhance overall performance with BADL and iADL.  Patient progressing toward long term goals..  Continue plan of care.  OT Short Term Goals Week 1:  OT Short Term Goal 1 (Week 1): pt will don shirt wiht MIN A consistently OT Short Term Goal 1 - Progress (Week 1): Met OT Short Term Goal 2 (Week 1): Pt will recall hemi strategies wiht no VC OT Short Term Goal 2 - Progress (Week 1): Met OT Short Term Goal 3 (Week 1): Pt will thread BLE into pants OT Short Term Goal 3 - Progress (Week 1): Met OT Short Term Goal 4 (Week 1): Pt will compelte 2/3 components of toileting OT Short Term Goal 4 - Progress (Week 1): Met OT Short Term Goal 5 (Week 1): Pt will trasnfer to toilet  wiht CGA and LRAD Week 2:  OT Short Term Goal 2 (Week 2): STG=LTG 2/2 ELOS   [name] 02/28/2020, 6:13 AM

## 2020-02-28 NOTE — Progress Notes (Signed)
Physical Therapy Weekly Progress Note  Patient Details  Name: Brent Limones Sr. MRN: [id number] Date of Birth: Dec 05, 1950  Beginning of progress report period: February 22, 2020 End of progress report period: February 28, 2020  Today's Date: 02/28/2020 PT Individual Time: 1451-1540 PT Individual Time Calculation (min): 49 min   Patient has met 5 of 5 short term goals.  Pt has made excellent gains during current course of therapy. Pt currently is performing transfers at [facility] to close S level, gait Close S for shorter distances and CGA for longer distances and uneven surfaces. Pt is most limited by weakness in hemi paretic side however demonstrates good safety awareness and safety with functional mobility. Pt's wife present on 2/11 for hands on family education and demonstrates good understanding of guarding and supervision assist as needed. Pt has also practiced floor transfer and with instruction has been able to perform with CGA.   Patient continues to demonstrate the following deficits muscle weakness and muscle paralysis and impaired timing and sequencing, abnormal tone, unbalanced muscle activation and decreased coordination and therefore will continue to benefit from skilled PT intervention to increase functional independence with mobility.  Patient progressing toward long term goals..  Continue plan of care.  PT Short Term Goals Week 1:  PT Short Term Goal 1 (Week 1): Pt will increase bed to S. PT Short Term Goal 2 (Week 1): Pt will incease transfers to min A. PT Short Term Goal 3 (Week 1): Pt will ambulate with LRAD about 50 feet with min A. PT Short Term Goal 4 (Week 1): Pt will ascend/descend 6 stairs with 1 rail and mod A. PT Short Term Goal 5 (Week 1): Pt will propel w/c about 150 feet with S. Week 2:     Skilled Therapeutic Interventions/Progress Updates:      Therapy Documentation Precautions:  Precautions Precautions: Fall Precaution Comments: R  hemiparesis Restrictions Weight Bearing Restrictions: No   Therapy/Group: Individual Therapy  Rosita DeChalus 02/28/2020, 4:01 PM

## 2020-02-28 NOTE — Progress Notes (Signed)
Physical Therapy Session Note  Patient Details  Name: Brent Curran Sr. MRN: [id number] Date of Birth: July 14, 1950  Today's Date: 02/28/2020 PT Individual Time: 8469-6295 PT Individual Time Calculation (min): 29 min   Short Term Goals: Week 1:  PT Short Term Goal 1 (Week 1): Pt will increase bed to S. PT Short Term Goal 2 (Week 1): Pt will incease transfers to min A. PT Short Term Goal 3 (Week 1): Pt will ambulate with LRAD about 50 feet with min A. PT Short Term Goal 4 (Week 1): Pt will ascend/descend 6 stairs with 1 rail and mod A. PT Short Term Goal 5 (Week 1): Pt will propel w/c about 150 feet with S.  Skilled Therapeutic Interventions/Progress Updates:    pt received in bed and agreeable to therapy. Pt directed in supine>sit CGA, sat EOB for changing clothes, min A for shirt and pants, max A for socks and shoes. Pt directed in Sit to stand to Rolling walker CGA and directed in gait training CGA to sink and requested to brush teeth, setup A to complete. Pt then directed in gait training with Rolling walker for 250' CGA with VC for trunk extension, and increased step length on RLE. Pt directed in NMRE for ambulation with x3 cone stepping with forward and side stepping for directional changes CGA and one mod A with LOB R laterally one final cone, pt completed 2x3. Pt directed in R and L lateral side stepping CGA without UE support 8'x4. Pt directed in backward stepping 8'x4 CGA with VC for increased step through pattern. Pt directed in gait training back to room 250' with Rolling walker. Pt's wife present at end of session in room, pt transferred to recliner SBA and left in recliner, alarm set, All needs in reach and in good condition. Call light in hand.    Therapy Documentation Precautions:  Precautions Precautions: Fall Precaution Comments: R hemiparesis Restrictions Weight Bearing Restrictions: No General:   Vital Signs:   Pain:   Mobility:   Locomotion :    Trunk/Postural  Assessment :    Balance:   Exercises:   Other Treatments:      Therapy/Group: Individual Therapy  Junie Panning 02/28/2020, 10:09 AM

## 2020-02-28 NOTE — Progress Notes (Signed)
Physical Therapy Session Note  Patient Details  Name: Brent Giraud Sr. MRN: [id number] Date of Birth: 06-28-1950  Today's Date: 02/28/2020 PT Individual Time: 1108-1205 and 1451-1540 PT Individual Time Calculation (min): 57 min   Short Term Goals: Week 1:  PT Short Term Goal 1 (Week 1): Pt will increase bed to S. PT Short Term Goal 2 (Week 1): Pt will incease transfers to min A. PT Short Term Goal 3 (Week 1): Pt will ambulate with LRAD about 50 feet with min A. PT Short Term Goal 4 (Week 1): Pt will ascend/descend 6 stairs with 1 rail and mod A. PT Short Term Goal 5 (Week 1): Pt will propel w/c about 150 feet with S.  Skilled Therapeutic Interventions/Progress Updates: Pt presented in recliner with wife present for family education. Pt denies pain during session. PTA discussed pt's current status (CGA to supervision level) as well as monitoring R hand on RW and RLE with fatigue during ambulation. During periodic rest breaks PTA discussed energy conservation, community integration and fall recovery. Pt participated in car transfers, gait up/down ramp, gait on mulch, obstacle course including thresholds, weaving through cones, and gait on compliant surface. All activities were done at [facility] to supervision level with wife providing appropriate cues when needed (forgot to push from mat x 1) and guarding on affected side. Pt and wife also participated in ascending/descending x 8 steps with close S with education provided prior on sequencing and appropriate guarding. Upon returning back to room answered any additional questions and agreeable to try STS from standard toilet as wife asking if needed BSC. Pt ambulated to toilet and performed STS x 2 from standard toilet x 2. Pt returned to recliner at end of session and left with seat alarm on, call bell within reach and needs met.   Tx2: Pt presented in recliner agreeable to therapy. Pt states some increased pain in L toe due to gout. Placed ice pack on L  foot per pt request at end of session. Pt ambulated to rehab gym with RW and supervision. Session focused on floor transfer/recovery. Pt was able to transfer to floor from mat and performed floor transfer x 2. First transfer required minA from PTA for positioning and safety. Pt was able to perform second floor transfer with CGA overall. Pt then ambulated to ortho gym and participated in NuStep L3 x 5 min with four extremities for reciprocal activity, general conditioning and maintaining grip on handle with R hand. Pt then did additional x 4 min with BLE only for general strengthing/conditioning. Pt ambulated back to room at end of session with RW and supervision and returned to recliner. Pt left in recliner at end of session with seat alarm on, call bell within reach and needs met.      Therapy Documentation Precautions:  Precautions Precautions: Fall Precaution Comments: R hemiparesis Restrictions Weight Bearing Restrictions: No    Therapy/Group: Individual Therapy  Brent Mccormick  Brent Mccormick, PTA  02/28/2020, 12:26 PM

## 2020-02-29 DIAGNOSIS — M10072 Idiopathic gout, left ankle and foot: Secondary | ICD-10-CM

## 2020-02-29 MED ORDER — PREDNISONE 10 MG PO TABS
10.0000 mg | ORAL_TABLET | Freq: Two times a day (BID) | ORAL | Status: AC
  Administered 2020-02-29 – 2020-03-02 (×5): 10 mg via ORAL
  Filled 2020-02-29 (×5): qty 1

## 2020-02-29 NOTE — Progress Notes (Signed)
[facility] PHYSICAL MEDICINE & REHABILITATION PROGRESS NOTE  Subjective/Complaints:  Gout pain better in left middle toe but still tender, affects gait. Couldn't tolerate indocin d/t GI upset  ROS: Patient denies fever, rash, sore throat, blurred vision, nausea, vomiting, diarrhea, cough, shortness of breath or chest pain,  headache, or mood change.     Objective: Vital Signs: Blood pressure 129/76, pulse 69, temperature 97.8 F (36.6 C), resp. rate 18, height 5\' 10"  (1.778 m), weight 77.4 kg, SpO2 98 %. No results found. No results for input(s): WBC, HGB, HCT, PLT in the last 72 hours. Recent Labs    02/27/20 0522  NA 136  K 4.4  CL 102  CO2 22  GLUCOSE 126*  BUN 26*  CREATININE 1.16  CALCIUM 9.1    Intake/Output Summary (Last 24 hours) at 02/29/2020 1013 Last data filed at 02/29/2020 5427 Gross per 24 hour  Intake 640 ml  Output 400 ml  Net 240 ml        Physical Exam: BP 129/76 (BP Location: Left Arm)   Pulse 69   Temp 97.8 F (36.6 C)   Resp 18   Ht 5\' 10"  (1.778 m)   Wt 77.4 kg   SpO2 98%   BMI 24.48 kg/m  Constitutional: No distress . Vital signs reviewed. HEENT: EOMI, oral membranes moist Neck: supple Cardiovascular: RRR without murmur. No JVD    Respiratory/Chest: CTA Bilaterally without wheezes or rales. Normal effort    GI/Abdomen: BS +, non-tender, non-distended Ext: no clubbing, cyanosis, or edema Psych: pleasant and cooperative Musc: Mild right wrist edema and tenderness, almost resolved swelling L 3rd toe remains very red. Still tender, swollen  Neuro: Ox3; MAS of 1 in RUE- wrist, elbow and shoulder Motor: LUE/LLE: 5/5 proximal distal RUE: 3+/5  Except R grip 2/5, and finger abd 1/5- grip still 2/5--exam stable. RLE: 4+/5 proximal to distal   Assessment/Plan: 1. Functional deficits which require 3+ hours per day of interdisciplinary therapy in a comprehensive inpatient rehab setting.  Physiatrist is providing close team supervision and  24 hour management of active medical problems listed below.  Physiatrist and rehab team continue to assess barriers to discharge/monitor patient progress toward functional and medical goals   Care Tool:  Bathing    Body parts bathed by patient: Right arm,Chest,Abdomen,Front perineal area,Right upper leg,Left upper leg,Left lower leg,Right lower leg,Face,Buttocks,Left arm   Body parts bathed by helper: Left arm,Right lower leg     Bathing assist Assist Level: Contact Guard/Touching assist     Upper Body Dressing/Undressing Upper body dressing   What is the patient wearing?: Pull over shirt    Upper body assist Assist Level: Supervision/Verbal cueing    Lower Body Dressing/Undressing Lower body dressing      What is the patient wearing?: Pants     Lower body assist Assist for lower body dressing: Contact Guard/Touching assist     Toileting Toileting    Toileting assist Assist for toileting: Minimal Assistance - Patient > 75%     Transfers Chair/bed transfer  Transfers assist     Chair/bed transfer assist level: Supervision/Verbal cueing     Locomotion Ambulation   Ambulation assist      Assist level: Contact Guard/Touching assist Assistive device: Walker-rolling Max distance: 166ft   Walk 10 feet activity   Assist     Assist level: Contact Guard/Touching assist Assistive device: Walker-rolling   Walk 50 feet activity   Assist    Assist level: Contact Guard/Touching assist Assistive device:  Walker-rolling    Walk 150 feet activity   Assist Walk 150 feet activity did not occur: Safety/medical concerns  Assist level: Contact Guard/Touching assist Assistive device: Walker-rolling    Walk 10 feet on uneven surface  activity   Assist Walk 10 feet on uneven surfaces activity did not occur: Safety/medical concerns   Assist level: Contact Guard/Touching assist Assistive device: Aeronautical engineer Will  patient use wheelchair at discharge?: Yes Type of Wheelchair: Manual    Wheelchair assist level: Minimal Assistance - Patient > 75% Max wheelchair distance: 75    Wheelchair 50 feet with 2 turns activity    Assist        Assist Level: Minimal Assistance - Patient > 75%   Wheelchair 150 feet activity     Assist      Assist Level: Moderate Assistance - Patient 50 - 74%    Medical Problem List and Plan: 1.  Right hemiparesis secondary to ischemic infarct left basal ganglia likely due to small vessel disease.  Status post loop recorder  Continue CIR 2.  Antithrombotics: -DVT/anticoagulation: Lovenox             -antiplatelet therapy: Aspirin 81 mg daily and Plavix 75 mg daily x3 weeks then Plavix alone 3. Pain Management: Tylenol as needed  2/8- having feet pain- will try tylenol AND Flexeril 10 mg TID prn 4. Mood: Provide emotional support  2/7- if mood doesn't improve, need to add an SSRI  2/9- staff don't feel needs SSRI at this time-con't to monitor             -antipsychotic agents: N/A 5. Neuropsych: This patient is capable of making decisions on his own behalf. 6. Skin/Wound Care: Routine skin checks 7. Fluids/Electrolytes/Nutrition: Routine in and outs            2/8- looking good- con't regimen- Cr stable at 1.09 8.  Hyperlipidemia: Crestor 9.  History of gout.  Colchicine 0.6 mg every other day.               Monitor for flares  2/9- GOUT FLARE- will change colchicine to 0.6 mg BID and add Indomethacin 75 mg BID x 4 days- if doesn't work, might require steroids.   2/10- pain much better- will con't current regimen since working.  2/12--will give a few doses of prednisone to help cool his toe off further. Still quite inflamed 10.  Essential hypertension.  Cozaar 100 mg daily  Mildly elevated on 2/6, monitor for persistence  2/7- BP 137/86- staying in 130s/80s- con't regimen  2/12 bp's controlled 12.  Tachycardia-likely secondary to deconditioning              ECG on 2/4 reviewed-normal  ?  Improving 13.  Leukocytosis             WBCs 11.1 on 2/4, CBC ordered for tomorrow  2/7- WBC 8.4- no signs/Sx's of infection- con't to monitor             Afebrile, no signs/symptoms of infection   14.  Elevated BUN  2/7- BUN 22- slightly improved- con't to push PO fluids  2/9- will recheck tomorrow  2/10- BUN up to 26 from 22- have to really push PO- or will need IVFs.   Continue to Encourage fluids 15.  Sleep disturbance  Melatonin with benefit  2/7- "slept the best in weeks"- con't regimen  2/8- pain interfering with sleep- will add flexeril which can also  make pts sleepy  2/9- slept poorly due to Gout pain- con't regimen and monitor  2/12- sleeping better   16. R wrist swelling/pain  2/7- improved with K-taping- con't regimen 17/ Spasticity  2/11- went over spasticity- how it occurs, prognosis, etc- won't treat unless painful or affects function- will monitor-      LOS: 8 days A FACE TO FACE EVALUATION WAS PERFORMED  Meredith Staggers 02/29/2020, 10:13 AM

## 2020-02-29 NOTE — Progress Notes (Signed)
Physical Therapy Session Note  Patient Details  Name: Brent Lotter Sr. MRN: [id number] Date of Birth: [birth year]  Today's Date: 02/29/2020 PT Individual Time: 1004-1059 PT Individual Time Calculation (min): 55 min   Short Term Goals: Week 2:  STGs=LTGs  Skilled Therapeutic Interventions/Progress Updates:     Pt received seated in recliner and agrees to therapy. Reports "gout" pain in 2nd toe on R foot. PT provides rest breaks as needed to manage pain. Stand step transfer to [facility] with RW and cues on positioning. WC transport to gym for time management. PT explains rationale and directions for 6 minute walk test and pt verbalizes understanding. Pt completes with score of 587', with PT providing verbal cues for upright gaze to improve posture and balance. Pt does not require seated rest break during activity. Following, pt performs TUG x3 with scores of 26.5, 22.4 and 18.5 seconds for average score of 22.5 seconds.   Pt performs repeated reps of sit to stand with focus on body mechanics and "nose over toes" technique, especially while transitioning from standing to sitting to improve eccentric control of movement. Pt complete x10 reps total.  Pt trials ambulation without AD. Pt ambulates 90' with CGA and cues for upright gaze to improve posture and balance, and increasing gait speed to decrease risk for falls. Following seated rest break, pt ambulates additional 90' including x8 6" steps with BHRs, with CGA. Pt cues for posture and transitioning weight into forefoot to prevent posterior LOBs.  Pt left seated in recliner with alarm intact and all needs within reach.  Therapy Documentation Precautions:  Precautions Precautions: Fall Precaution Comments: R hemiparesis Restrictions Weight Bearing Restrictions: No    Therapy/Group: Individual Therapy  Breck Coons, PT, DPT 02/29/2020, 4:17 PM

## 2020-03-01 NOTE — Progress Notes (Signed)
[facility] PHYSICAL MEDICINE & REHABILITATION PROGRESS NOTE  Subjective/Complaints:  Pt feels that prednisone has helped. Asked how long he's going to be on it. Able to walk with therapy yesterday without too many problems yesterday  ROS: Patient denies fever, rash, sore throat, blurred vision, nausea, vomiting, diarrhea, cough, shortness of breath or chest pain,   headache, or mood change.     Objective: Vital Signs: Blood pressure (!) 141/84, pulse 70, temperature 97.8 F (36.6 C), temperature source Oral, resp. rate 18, height 5\' 10"  (1.778 m), weight 77.4 kg, SpO2 96 %. No results found. No results for input(s): WBC, HGB, HCT, PLT in the last 72 hours. No results for input(s): NA, K, CL, CO2, GLUCOSE, BUN, CREATININE, CALCIUM in the last 72 hours.  Intake/Output Summary (Last 24 hours) at 03/01/2020 0848 Last data filed at 03/01/2020 0751 Gross per 24 hour  Intake 560 ml  Output 450 ml  Net 110 ml        Physical Exam: BP (!) 141/84 (BP Location: Left Arm)   Pulse 70   Temp 97.8 F (36.6 C) (Oral)   Resp 18   Ht 5\' 10"  (1.778 m)   Wt 77.4 kg   SpO2 96%   BMI 24.48 kg/m  Constitutional: No distress . Vital signs reviewed. HEENT: EOMI, oral membranes moist Neck: supple Cardiovascular: RRR without murmur. No JVD    Respiratory/Chest: CTA Bilaterally without wheezes or rales. Normal effort    GI/Abdomen: BS +, non-tender, non-distended Ext: no clubbing, cyanosis, or edema Psych: pleasant and cooperative Musc: Mild right wrist edema and tenderness.  L 2nd toe remains still red/swollen but decreased in size, more isolated to DIP  Neuro: Ox3; MAS of 1 in RUE- wrist, elbow and shoulder Motor: LUE/LLE: 5/5 proximal distal RUE: 3+/5  Except R grip 2/5, and finger abd 1/5- grip still 2/5--stable RLE: 4+/5 proximal to distal   Assessment/Plan: 1. Functional deficits which require 3+ hours per day of interdisciplinary therapy in a comprehensive inpatient rehab  setting.  Physiatrist is providing close team supervision and 24 hour management of active medical problems listed below.  Physiatrist and rehab team continue to assess barriers to discharge/monitor patient progress toward functional and medical goals   Care Tool:  Bathing    Body parts bathed by patient: Right arm,Chest,Abdomen,Front perineal area,Right upper leg,Left upper leg,Left lower leg,Right lower leg,Face,Buttocks,Left arm   Body parts bathed by helper: Left arm,Right lower leg     Bathing assist Assist Level: Contact Guard/Touching assist     Upper Body Dressing/Undressing Upper body dressing   What is the patient wearing?: Pull over shirt    Upper body assist Assist Level: Supervision/Verbal cueing    Lower Body Dressing/Undressing Lower body dressing      What is the patient wearing?: Pants     Lower body assist Assist for lower body dressing: Contact Guard/Touching assist     Toileting Toileting    Toileting assist Assist for toileting: Minimal Assistance - Patient > 75%     Transfers Chair/bed transfer  Transfers assist     Chair/bed transfer assist level: Supervision/Verbal cueing     Locomotion Ambulation   Ambulation assist      Assist level: Supervision/Verbal cueing Assistive device: Walker-rolling Max distance: 587'   Walk 10 feet activity   Assist     Assist level: Supervision/Verbal cueing Assistive device: Walker-rolling   Walk 50 feet activity   Assist    Assist level: Supervision/Verbal cueing Assistive device: Walker-rolling  Walk 150 feet activity   Assist Walk 150 feet activity did not occur: Safety/medical concerns  Assist level: Supervision/Verbal cueing Assistive device: Walker-rolling    Walk 10 feet on uneven surface  activity   Assist Walk 10 feet on uneven surfaces activity did not occur: Safety/medical concerns   Assist level: Contact Guard/Touching assist Assistive device:  Aeronautical engineer Will patient use wheelchair at discharge?: Yes Type of Wheelchair: Manual    Wheelchair assist level: Minimal Assistance - Patient > 75% Max wheelchair distance: 75    Wheelchair 50 feet with 2 turns activity    Assist        Assist Level: Minimal Assistance - Patient > 75%   Wheelchair 150 feet activity     Assist      Assist Level: Moderate Assistance - Patient 50 - 74%   BP (!) 141/84 (BP Location: Left Arm)   Pulse 70   Temp 97.8 F (36.6 C) (Oral)   Resp 18   Ht 5\' 10"  (1.778 m)   Wt 77.4 kg   SpO2 96%   BMI 24.48 kg/m   Medical Problem List and Plan: 1.  Right hemiparesis secondary to ischemic infarct left basal ganglia likely due to small vessel disease.  Status post loop recorder  Continue CIR PT, OT 2.  Antithrombotics: -DVT/anticoagulation: Lovenox             -antiplatelet therapy: Aspirin 81 mg daily and Plavix 75 mg daily x3 weeks then Plavix alone 3. Pain Management: Tylenol as needed  2/8- having feet pain- will try tylenol AND Flexeril 10 mg TID prn 4. Mood: Provide emotional support  2/7- if mood doesn't improve, need to add an SSRI  2/9- staff don't feel needs SSRI at this time-con't to monitor             -antipsychotic agents: N/A 5. Neuropsych: This patient is capable of making decisions on his own behalf. 6. Skin/Wound Care: Routine skin checks 7. Fluids/Electrolytes/Nutrition: Routine in and outs            2/8- looking good- con't regimen- Cr stable at 1.09 8.  Hyperlipidemia: Crestor 9.  History of gout.  Colchicine 0.6 mg every other day.               Monitor for flares  2/9- GOUT FLARE- will change colchicine to 0.6 mg BID and add Indomethacin 75 mg BID x 4 days- if doesn't work, might require steroids.   2/10- pain much better- will con't current regimen since working.  2/13- initiated low dose prednisone yesterday. Has helped. Continue x 3 more doses thru tomorrow morning 10.   Essential hypertension.  Cozaar 100 mg daily  Mildly elevated on 2/6, monitor for persistence  2/7- BP 137/86- staying in 130s/80s- con't regimen  2/12-13 bp's controlled 12.  Tachycardia-likely secondary to deconditioning             ECG on 2/4 reviewed-normal  ?  Improving 13.  Leukocytosis             WBCs 11.1 on 2/4, CBC ordered for tomorrow  2/7- WBC 8.4- no signs/Sx's of infection- con't to monitor             Afebrile, no signs/symptoms of infection   14.  Elevated BUN  2/7- BUN 22- slightly improved- con't to push PO fluids  2/9- will recheck tomorrow  2/10- BUN up to 26 from 22- have to really  push PO- or will need IVFs.   Continue to Encourage fluids 15.  Sleep disturbance  Melatonin with benefit  2/7- "slept the best in weeks"- con't regimen  2/8- pain interfering with sleep- will add flexeril which can also make pts sleepy  2/9- slept poorly due to Gout pain- con't regimen and monitor  2/12-13  sleeping better   16. R wrist swelling/pain  2/7- improved with K-taping- con't regimen 17/ Spasticity  2/11- went over spasticity- how it occurs, prognosis, etc- won't treat unless painful or affects function- will monitor-      LOS: 9 days A FACE TO FACE EVALUATION WAS PERFORMED  Meredith Staggers 03/01/2020, 8:48 AM

## 2020-03-01 NOTE — Progress Notes (Signed)
Occupational Therapy Session Note  Patient Details  Name: Brent Katich Sr. MRN: [id number] Date of Birth: 01-26-1950  Today's Date: 03/01/2020 OT Individual Time: 2353-6144 OT Individual Time Calculation (min): 60 min    Short Term Goals: Week 2:  OT Short Term Goal 2 (Week 2): STG=LTG 2/2 ELOS  Skilled Therapeutic Interventions/Progress Updates:    Patient in bed, alert and ready for therapy session.  Bed mobility completed mod I.  He is able to dress (OH shirt, pants, socks and shoes - to include tying) seated edge of bed with set up/CS - min cues and guidance for technique.  Functional transfers and mobility with RW CS - he was able to walk to and from therapy gym.  Completed NMRE for right arm, posture and trunk control with focus on proximal stability, reduced compensation, and improved reach.  At start of session he demonstrated OH reach with IR and abduction by close of session shoulder flexion equal with left arm.  Completed weight bearing and stretch in standing position on various surfaces, motor control activities with good tolerance.  Also note improved motor control at elbow.  Reviewed and practiced activities that he can complete on his own with good carryover.   He returned to recliner at close of session, seat alarm set and call bell in hand.    Therapy Documentation Precautions:  Precautions Precautions: Fall Precaution Comments: R hemiparesis Restrictions Weight Bearing Restrictions: No  Therapy/Group: Individual Therapy  Carlos Levering 03/01/2020, 7:26 AM

## 2020-03-02 ENCOUNTER — Telehealth: Payer: Self-pay | Admitting: Adult Health

## 2020-03-02 LAB — CBC WITH DIFFERENTIAL/PLATELET
Abs Immature Granulocytes: 0.05 10*3/uL (ref 0.00–0.07)
Basophils Absolute: 0 10*3/uL (ref 0.0–0.1)
Basophils Relative: 0 %
Eosinophils Absolute: 0 10*3/uL (ref 0.0–0.5)
Eosinophils Relative: 0 %
HCT: 39.4 % (ref 39.0–52.0)
Hemoglobin: 13.6 g/dL (ref 13.0–17.0)
Immature Granulocytes: 0 %
Lymphocytes Relative: 13 %
Lymphs Abs: 1.7 10*3/uL (ref 0.7–4.0)
MCH: 30.4 pg (ref 26.0–34.0)
MCHC: 34.5 g/dL (ref 30.0–36.0)
MCV: 88.1 fL (ref 80.0–100.0)
Monocytes Absolute: 0.7 10*3/uL (ref 0.1–1.0)
Monocytes Relative: 5 %
Neutro Abs: 10.3 10*3/uL — ABNORMAL HIGH (ref 1.7–7.7)
Neutrophils Relative %: 82 %
Platelets: 426 10*3/uL — ABNORMAL HIGH (ref 150–400)
RBC: 4.47 MIL/uL (ref 4.22–5.81)
RDW: 12 % (ref 11.5–15.5)
WBC: 12.7 10*3/uL — ABNORMAL HIGH (ref 4.0–10.5)
nRBC: 0 % (ref 0.0–0.2)

## 2020-03-02 LAB — COMPREHENSIVE METABOLIC PANEL
ALT: 57 U/L — ABNORMAL HIGH (ref 0–44)
AST: 41 U/L (ref 15–41)
Albumin: 3.5 g/dL (ref 3.5–5.0)
Alkaline Phosphatase: 43 U/L (ref 38–126)
Anion gap: 10 (ref 5–15)
BUN: 24 mg/dL — ABNORMAL HIGH (ref 8–23)
CO2: 25 mmol/L (ref 22–32)
Calcium: 9.4 mg/dL (ref 8.9–10.3)
Chloride: 105 mmol/L (ref 98–111)
Creatinine, Ser: 1.2 mg/dL (ref 0.61–1.24)
GFR, Estimated: >60 mL/min (ref >60)
Glucose, Bld: 123 mg/dL — ABNORMAL HIGH (ref 70–99)
Potassium: 4.6 mmol/L (ref 3.5–5.1)
Sodium: 140 mmol/L (ref 135–145)
Total Bilirubin: 0.7 mg/dL (ref 0.3–1.2)
Total Protein: 7.1 g/dL (ref 6.5–8.1)

## 2020-03-02 MED ORDER — SALINE SPRAY 0.65 % NA SOLN
1.0000 | NASAL | Status: DC | PRN
  Filled 2020-03-02: qty 44

## 2020-03-02 NOTE — Progress Notes (Signed)
Occupational Therapy Session Note  Patient Details  Name: Brent Mccormick. MRN: [id number] Date of Birth: July 21, 1950  Today's Date: 03/02/2020 OT Individual Time: 1503-1530 OT Individual Time Calculation (min): 27 min   Short Term Goals: Week 2:  OT Short Term Goal 2 (Week 2): STG=LTG 2/2 ELOS  Skilled Therapeutic Interventions/Progress Updates:    Pt greeted in the recliner with no c/o pain. ADL needs met. He was agreeable to work on his Rt hand. Pt participated in one graded [facility] activity involving manipulation and design assembly using geometric blocks of different shapes. Pt required tactile cues to minimize shoulder hike, manual guidance at scapula, and also min forearm facilitation to meet demands of task. The most difficult aspects of activity involved palm>finger translations and stacking. He remained sitting in the recliner with all needs within reach.   Therapy Documentation Precautions:  Precautions Precautions: Fall Precaution Comments: R hemiparesis Restrictions Weight Bearing Restrictions: No Vital Signs: Therapy Vitals Temp: 98.1 F (36.7 C) Temp Source: Oral Pulse Rate: 80 Resp: 18 BP: (!) 149/88 Patient Position (if appropriate): Sitting Oxygen Therapy SpO2: 100 % O2 Device: Room Air ADL: ADL Grooming: Minimal assistance Where Assessed-Grooming: Standing at sink Upper Body Bathing: Minimal assistance Where Assessed-Upper Body Bathing: Shower Lower Body Bathing: Moderate assistance Where Assessed-Lower Body Bathing: Shower Upper Body Dressing: Moderate assistance Where Assessed-Upper Body Dressing: Sitting at sink Lower Body Dressing: Maximal assistance Where Assessed-Lower Body Dressing: Sitting at sink Toileting: Maximal assistance Where Assessed-Toileting: Bedside Commode Toilet Transfer: Minimal assistance Toilet Transfer Method: Stand pivot Gaffer Transfer: Minimal assistance Social research officer, government Method: Stand pivot       Therapy/Group: Individual Therapy  Rudolph Daoust A Brinkley Peet 03/02/2020, 4:07 PM

## 2020-03-02 NOTE — Telephone Encounter (Signed)
Patient's wife Brent Mccormick is calling regarding FMLA paperwork that she dropped off.  She is just requesting that we give her a call once it has been faxed.

## 2020-03-02 NOTE — Progress Notes (Signed)
Occupational Therapy Session Note  Patient Details  Name: Brent Willner Sr. MRN: [id number] Date of Birth: 12/11/1950  Today's Date: 03/02/2020 OT Individual Time: 6219-4712 OT Individual Time Calculation (min): 55 min    Short Term Goals: Week 2:  OT Short Term Goal 2 (Week 2): STG=LTG 2/2 ELOS  Skilled Therapeutic Interventions/Progress Updates:    Initial focus on bathing at shower level and dressing with sit<>stand from EOB. Pt completed all tasks with CGA when standing. Pt able to fasten shoes with supervsion. Pt amb with RW to ortho gym and engaged in BUE functional tasks washing wall and windows.  Pt additionally completed wall push ups with focus on RUE elbow extension. Pt returned to room and remained in recliner with all needs within reach and seat alarm activated.   Therapy Documentation Precautions:  Precautions Precautions: Fall Precaution Comments: R hemiparesis Restrictions Weight Bearing Restrictions: No Pain:     Therapy/Group: Individual Therapy  Brent Mccormick 03/02/2020, 9:59 AM

## 2020-03-02 NOTE — Progress Notes (Signed)
Physical Therapy Session Note  Patient Details  Name: Brent Petrucelli Sr. MRN: [id number] Date of Birth: 1950-05-30  Today's Date: 03/02/2020 PT Individual Time: 1100-1200 PT Individual Time Calculation (min): 60 min   Short Term Goals: Week 1:  PT Short Term Goal 1 (Week 1): Pt will increase bed to S. PT Short Term Goal 1 - Progress (Week 1): Met PT Short Term Goal 2 (Week 1): Pt will incease transfers to min A. PT Short Term Goal 2 - Progress (Week 1): Met PT Short Term Goal 3 (Week 1): Pt will ambulate with LRAD about 50 feet with min A. PT Short Term Goal 3 - Progress (Week 1): Met PT Short Term Goal 4 (Week 1): Pt will ascend/descend 6 stairs with 1 rail and mod A. PT Short Term Goal 4 - Progress (Week 1): Met PT Short Term Goal 5 (Week 1): Pt will propel w/c about 150 feet with S. PT Short Term Goal 5 - Progress (Week 1): Met  Skilled Therapeutic Interventions/Progress Updates:    Pt seated in recliner on arrival and agreeable to therapy. Gait x 200 ft with RW and supervision to gym. Pt performs steps ups on 6 " stair x 10 bilaterally before performing 6" stairs with B HR, 4x5. Pt able to ascend stairs reciprocally with close supervision. Descends stairs with step to pattern for safety and close supervision. Pt performed lateral heel taps from 3" step 3 x 10 to improve eccentric control of R quad and hip hikes 3 x 10 to improve lateral hip control. Gait x 200 ft, x 300 ft with no AD and CGA. Pt demonstrates improving gait mechanics, with occasional decreased step height and consistent decreased arm swing. Pt performed 10 min on nustep at level 6 at 40-50 spm. Pt reported 12/20 RPE after using nustep. Pt returned to recliner after session and was left with chair alarm active and all needs in reach.   Therapy Documentation Precautions:  Precautions Precautions: Fall Precaution Comments: R hemiparesis Restrictions Weight Bearing Restrictions: No    Therapy/Group: Individual  Therapy  Sharen Counter, SPT 03/02/2020, 1:24 PM

## 2020-03-02 NOTE — Progress Notes (Signed)
[facility] PHYSICAL MEDICINE & REHABILITATION PROGRESS NOTE  Subjective/Complaints:  Pt reports that L 2nd toe MUCH better- no pain now at all.   Grip is also better.  Said he thinks he's on the last dose of steroids for toe today.   Asking if will go home on Lovenox- explained since walking 500-600 ft at a time, he will NOT go home on Lovenox.    ROS:  Pt denies SOB, abd pain, CP, N/V/C/D, and vision changes    Objective: Vital Signs: Blood pressure 132/85, pulse 71, temperature 98.5 F (36.9 C), resp. rate 20, height 5\' 10"  (1.778 m), weight 77.4 kg, SpO2 98 %. No results found. Recent Labs    03/02/20 0603  WBC 12.7*  HGB 13.6  HCT 39.4  PLT 426*   Recent Labs    03/02/20 0603  NA 140  K 4.6  CL 105  CO2 25  GLUCOSE 123*  BUN 24*  CREATININE 1.20  CALCIUM 9.4    Intake/Output Summary (Last 24 hours) at 03/02/2020 0932 Last data filed at 03/02/2020 0730 Gross per 24 hour  Intake 680 ml  Output 525 ml  Net 155 ml        Physical Exam: BP 132/85 (BP Location: Left Arm)   Pulse 71   Temp 98.5 F (36.9 C)   Resp 20   Ht 5\' 10"  (1.778 m)   Wt 77.4 kg   SpO2 98%   BMI 24.48 kg/m  Constitutional: No distress . Vital signs reviewed. Sitting up in bed watching TV, appropriate, NAD HEENT: EOMI, oral membranes moist Neck: supple Cardiovascular: RRR- no JVD   Respiratory/Chest: CTA B/L- no W/R/R- good air movement  GI/Abdomen: Soft, NT, ND, (+)BS  Ext: no clubbing, cyanosis, or edema Psych: pleasant and cooperative Musc: Mild right wrist edema and tenderness.  L 2nd toe in swollen/red at tip only- not TTP anymore Neuro: Ox3; MAS of 1 in RUE- wrist, elbow and shoulder Motor: LUE/LLE: 5/5 proximal distal RUE: 3+/5  Grip up to 4/5 and finger abd 2/5 now- wiggles fingers much better and can make fist now RLE: 4+/5 proximal to distal   Assessment/Plan: 1. Functional deficits which require 3+ hours per day of interdisciplinary therapy in a comprehensive  inpatient rehab setting.  Physiatrist is providing close team supervision and 24 hour management of active medical problems listed below.  Physiatrist and rehab team continue to assess barriers to discharge/monitor patient progress toward functional and medical goals   Care Tool:  Bathing    Body parts bathed by patient: Right arm,Chest,Abdomen,Front perineal area,Right upper leg,Left upper leg,Left lower leg,Right lower leg,Face,Buttocks,Left arm   Body parts bathed by helper: Left arm,Right lower leg     Bathing assist Assist Level: Contact Guard/Touching assist     Upper Body Dressing/Undressing Upper body dressing   What is the patient wearing?: Pull over shirt    Upper body assist Assist Level: Set up assist    Lower Body Dressing/Undressing Lower body dressing      What is the patient wearing?: Pants     Lower body assist Assist for lower body dressing: Supervision/Verbal cueing     Toileting Toileting    Toileting assist Assist for toileting: Minimal Assistance - Patient > 75%     Transfers Chair/bed transfer  Transfers assist     Chair/bed transfer assist level: Supervision/Verbal cueing     Locomotion Ambulation   Ambulation assist      Assist level: Supervision/Verbal cueing Assistive device: Walker-rolling  Max distance: 13'   Walk 10 feet activity   Assist     Assist level: Supervision/Verbal cueing Assistive device: Walker-rolling   Walk 50 feet activity   Assist    Assist level: Supervision/Verbal cueing Assistive device: Walker-rolling    Walk 150 feet activity   Assist Walk 150 feet activity did not occur: Safety/medical concerns  Assist level: Supervision/Verbal cueing Assistive device: Walker-rolling    Walk 10 feet on uneven surface  activity   Assist Walk 10 feet on uneven surfaces activity did not occur: Safety/medical concerns   Assist level: Contact Guard/Touching assist Assistive device:  Aeronautical engineer Will patient use wheelchair at discharge?: Yes Type of Wheelchair: Manual    Wheelchair assist level: Minimal Assistance - Patient > 75% Max wheelchair distance: 75    Wheelchair 50 feet with 2 turns activity    Assist        Assist Level: Minimal Assistance - Patient > 75%   Wheelchair 150 feet activity     Assist      Assist Level: Moderate Assistance - Patient 50 - 74%   BP 132/85 (BP Location: Left Arm)   Pulse 71   Temp 98.5 F (36.9 C)   Resp 20   Ht 5\' 10"  (1.778 m)   Wt 77.4 kg   SpO2 98%   BMI 24.48 kg/m   Medical Problem List and Plan: 1.  Right hemiparesis secondary to ischemic infarct left basal ganglia likely due to small vessel disease.  Status post loop recorder  2/14- no Lovneox after dc due to walking 600+ ft at a- time- strength improving in RUE esp  Continue CIR PT, OT 2.  Antithrombotics: -DVT/anticoagulation: Lovenox             -antiplatelet therapy: Aspirin 81 mg daily and Plavix 75 mg daily x3 weeks then Plavix alone 3. Pain Management: Tylenol as needed  2/8- having feet pain- will try tylenol AND Flexeril 10 mg TID prn  2/14- pain resolved- con't regimen 4. Mood: Provide emotional support  2/7- if mood doesn't improve, need to add an SSRI  2/9- staff don't feel needs SSRI at this time-con't to monitor             -antipsychotic agents: N/A 5. Neuropsych: This patient is capable of making decisions on his own behalf. 6. Skin/Wound Care: Routine skin checks 7. Fluids/Electrolytes/Nutrition: Routine in and outs            2/8- looking good- con't regimen- Cr stable at 1.09 8.  Hyperlipidemia: Crestor 9.  History of gout.  Colchicine 0.6 mg every other day.               Monitor for flares  2/9- GOUT FLARE- will change colchicine to 0.6 mg BID and add Indomethacin 75 mg BID x 4 days- if doesn't work, might require steroids.   2/10- pain much better- will con't current regimen since  working.  2/13- initiated low dose prednisone yesterday. Has helped. Continue x 3 more doses thru tomorrow morning  2/14- Sx's resolved- finishing steroids today- cause of WBC slightly elevated 10.  Essential hypertension.  Cozaar 100 mg daily  Mildly elevated on 2/6, monitor for persistence  2/7- BP 137/86- staying in 130s/80s- con't regimen  2/12-13 bp's controlled 12.  Tachycardia-likely secondary to deconditioning             ECG on 2/4 reviewed-normal  ?  Improving 13.  Leukocytosis  WBCs 11.1 on 2/4, CBC ordered for tomorrow  2/7- WBC 8.4- no signs/Sx's of infection- con't to monitor  2/14- WBC 12.7- due to steroids- will finish today             Afebrile, no signs/symptoms of infection   14.  Elevated BUN  2/7- BUN 22- slightly improved- con't to push PO fluids  2/9- will recheck tomorrow  2/10- BUN up to 26 from 22- have to really push PO- or will need IVFs.   Continue to Encourage fluids 15.  Sleep disturbance  Melatonin with benefit  2/7- "slept the best in weeks"- con't regimen  2/8- pain interfering with sleep- will add flexeril which can also make pts sleepy  2/9- slept poorly due to Gout pain- con't regimen and monitor  2/12-13  sleeping better   16. R wrist swelling/pain  2/7- improved with K-taping- con't regimen 17/ Spasticity  2/11- went over spasticity- how it occurs, prognosis, etc- won't treat unless painful or affects function- will monitor-      LOS: 10 days A FACE TO FACE EVALUATION WAS PERFORMED  Catheryn Slifer 03/02/2020, 9:32 AM

## 2020-03-02 NOTE — Progress Notes (Signed)
Occupational Therapy Session Note  Patient Details  Name: Brent Wojtaszek Sr. MRN: [id number] Date of Birth: March 22, 1950  Today's Date: 03/02/2020 OT Individual Time: 1300-1330 OT Individual Time Calculation (min): 30 min    Short Term Goals: Week 2:  OT Short Term Goal 2 (Week 2): STG=LTG 2/2 ELOS  Skilled Therapeutic Interventions/Progress Updates:    Pt resting in recliner upon arrival. Pt amb with RW to gym and sat EOM. OT intervention with focus on RUE functional use/strengthening. Pt initially stood at mirror and washed glass using RUE with focus on elbow extension and decreased use of compensatory strategies. Pt performed straight arm raises with boom whacker and focus on elbow extension. Pt also grasped hula hoop and performed rotations again with focus on elbow extension and decreased use of compensatory strategies. Pt returned to room and remained in recliner with seat alarm activated. All needs within reach.   Therapy Documentation Precautions:  Precautions Precautions: Fall Precaution Comments: R hemiparesis Restrictions Weight Bearing Restrictions: No Pain:  Pt denies pain this afternoon   Therapy/Group: Individual Therapy  [name] 03/02/2020, 2:46 PM

## 2020-03-02 NOTE — Discharge Summary (Signed)
Physician Discharge Summary  Patient ID: Brent Baldus Sr. MRN: [id number] DOB/AGE: 72-Jun-1952 72 y.o.  Admit date: 02/21/2020 Discharge date: 03/04/2020  Discharge Diagnoses:  Principal Problem:   Infarction of left basal ganglia (HCC) Active Problems:   Elevated BUN   Sleep disturbance Mood stabilization History of gout Hyperlipidemia Type 2 diabetes mellitus  Discharged Condition: Stable  Significant Diagnostic Studies: MR BRAIN WO CONTRAST  Result Date: 02/17/2020 CLINICAL DATA:  Follow-up stroke 24 hours post tPA. Right-sided weakness. EXAM: MRI HEAD WITHOUT CONTRAST TECHNIQUE: Multiplanar, multiecho pulse sequences of the brain and surrounding structures were obtained without intravenous contrast. COMPARISON:  CT studies done yesterday. FINDINGS: Brain: Diffusion imaging shows acute infarction in the left basal ganglia and radiating white matter tracts. No other acute insult. The brainstem and cerebellum are normal. Cerebral hemispheres elsewhere show few old small vessel infarctions of the white matter and of the right basal ganglia. No large vessel territory infarction. No mass lesion, hemorrhage, hydrocephalus or extra-axial collection. Vascular: Major vessels at the base of the brain show flow. Skull and upper cervical spine: Negative Sinuses/Orbits: Clear/normal Other: None IMPRESSION: 1. Acute infarction in the left basal ganglia and radiating white matter tracts. No mass effect or hemorrhage. 2. Mild chronic small-vessel ischemic changes elsewhere affecting the cerebral hemispheric white matter and right basal ganglia. Electronically Signed   By: Nelson Chimes M.D.   On: 02/17/2020 12:52   EP PPM/ICD IMPLANT  Result Date: 02/19/2020 SURGEON:  Allegra Lai, MD   PREPROCEDURE DIAGNOSIS:  Cryptogenic Stroke   POSTPROCEDURE DIAGNOSIS:  Cryptogenic Stroke    PROCEDURES:  1. Implantable loop recorder implantation   INTRODUCTION:  Brent Baize Sr. is a 70 y.o. male with a history of  unexplained stroke who presents today for implantable loop implantation.  The patient has had a cryptogenic stroke.  Despite an extensive workup by neurology, no reversible causes have been identified.  he has worn telemetry during which he did not have arrhythmias.  There is significant concern for possible atrial fibrillation as the cause for the patients stroke.  The patient therefore presents today for implantable loop implantation.   DESCRIPTION OF PROCEDURE:  Informed written consent was obtained, and the patient was brought to the electrophysiology lab in a fasting state.  The patient required no sedation for the procedure today.  Mapping over the patient's chest was performed by the EP lab staff to identify the area where electrograms were most prominent for ILR recording.  This area was found to be the left parasternal region over the 3rd-4th intercostal space. The patients left chest was therefore prepped and draped in the usual sterile fashion by the EP lab staff. The skin overlying the left parasternal region was infiltrated with lidocaine for local analgesia.  A 0.5-cm incision was made over the left parasternal region over the 3rd intercostal space.  A subcutaneous ILR pocket was fashioned using a combination of sharp and blunt dissection.  A Medtronic Reveal Linq model [place] [id number] implantable loop recorder was then placed into the pocket  R waves were very prominent and measured 0.57mV. EBL<1 ml.  Steri- Strips and a sterile dressing were then applied.  There were no [facility] apparent complications.   CONCLUSIONS:  1. Successful implantation of a Medtronic Reveal LINQ implantable loop recorder for cryptogenic stroke  2. No [facility] apparent complications.   ECHOCARDIOGRAM COMPLETE  Result Date: 02/17/2020    ECHOCARDIOGRAM REPORT   Patient Name:   Brent Due Sr. Date of Exam: 02/17/2020 Medical Rec #:  [id number]         Height:       69.0 in Accession #:    [id number]        Weight:       172.8  lb Date of Birth:  Dec 08, 1950        BSA:          1.942 m Patient Age:    70 years          BP:           126/77 mmHg Patient Gender: M                 HR:           46 bpm. Exam Location:  Inpatient Procedure: 2D Echo, Cardiac Doppler and Color Doppler Indications:    Stroke  History:        Patient has no prior history of Echocardiogram examinations.                 Risk Factors:Hypertension, Dyslipidemia, Diabetes and Former                 Smoker.  Sonographer:    Clayton Lefort RDCS (AE) Referring Phys: [id number] [place]  1. Left ventricular ejection fraction, by estimation, is 60 to 65%. The left ventricle has normal function. The left ventricle has no regional wall motion abnormalities. There is severe left ventricular hypertrophy of the basal-septal segment. Left ventricular diastolic parameters are consistent with Grade I diastolic dysfunction (impaired relaxation).  2. Right ventricular systolic function is normal. The right ventricular size is normal. Tricuspid regurgitation signal is inadequate for assessing PA pressure.  3. The mitral valve is normal in structure. No evidence of mitral valve regurgitation. No evidence of mitral stenosis.  4. The aortic valve is tricuspid. There is moderate calcification of the aortic valve. There is moderate thickening of the aortic valve. Aortic valve regurgitation is not visualized. Mild to moderate aortic valve sclerosis/calcification is present, without any evidence of aortic stenosis.  5. The inferior vena cava is normal in size with greater than 50% respiratory variability, suggesting right atrial pressure of 3 mmHg. FINDINGS  Left Ventricle: Left ventricular ejection fraction, by estimation, is 60 to 65%. The left ventricle has normal function. The left ventricle has no regional wall motion abnormalities. The left ventricular internal cavity size was normal in size. There is  severe left ventricular hypertrophy of the basal-septal segment. Left  ventricular diastolic parameters are consistent with Grade I diastolic dysfunction (impaired relaxation). Normal left ventricular filling pressure. Right Ventricle: The right ventricular size is normal. No increase in right ventricular wall thickness. Right ventricular systolic function is normal. Tricuspid regurgitation signal is inadequate for assessing PA pressure. Left Atrium: Left atrial size was normal in size. Right Atrium: Right atrial size was normal in size. Pericardium: There is no evidence of pericardial effusion. Mitral Valve: The mitral valve is normal in structure. Mild to moderate mitral annular calcification. No evidence of mitral valve regurgitation. No evidence of mitral valve stenosis. MV peak gradient, 2.5 mmHg. The mean mitral valve gradient is 1.0 mmHg. Tricuspid Valve: The tricuspid valve is normal in structure. Tricuspid valve regurgitation is trivial. No evidence of tricuspid stenosis. Aortic Valve: The aortic valve is tricuspid. There is moderate calcification of the aortic valve. There is moderate thickening of the aortic valve. Aortic valve regurgitation is not visualized. Mild to moderate aortic valve sclerosis/calcification is present, without any evidence of aortic  stenosis. Aortic valve mean gradient measures 3.0 mmHg. Aortic valve peak gradient measures 4.7 mmHg. Aortic valve area, by VTI measures 2.89 cm. Pulmonic Valve: The pulmonic valve was normal in structure. Pulmonic valve regurgitation is not visualized. No evidence of pulmonic stenosis. Aorta: The aortic root is normal in size and structure. Venous: The inferior vena cava is normal in size with greater than 50% respiratory variability, suggesting right atrial pressure of 3 mmHg. IAS/Shunts: The interatrial septum appears to be lipomatous. No atrial level shunt detected by color flow Doppler.  LEFT VENTRICLE PLAX 2D LVIDd:         3.50 cm  Diastology LVIDs:         2.40 cm  LV e' medial:    6.31 cm/s LV PW:         1.30 cm   LV E/e' medial:  8.4 LV IVS:        1.70 cm  LV e' lateral:   10.40 cm/s LVOT diam:     2.20 cm  LV E/e' lateral: 5.1 LV SV:         58 LV SV Index:   30 LVOT Area:     3.80 cm  RIGHT VENTRICLE             IVC RV Basal diam:  2.70 cm     IVC diam: 1.70 cm RV S prime:     14.10 cm/s TAPSE (M-mode): 2.2 cm LEFT ATRIUM             Index       RIGHT ATRIUM           Index LA diam:        3.50 cm 1.80 cm/m  RA Area:     12.70 cm LA Vol (A2C):   48.8 ml 25.13 ml/m RA Volume:   27.10 ml  13.96 ml/m LA Vol (A4C):   25.2 ml 12.98 ml/m LA Biplane Vol: 34.5 ml 17.77 ml/m  AORTIC VALVE AV Area (Vmax):    2.87 cm AV Area (Vmean):   2.63 cm AV Area (VTI):     2.89 cm AV Vmax:           108.00 cm/s AV Vmean:          75.400 cm/s AV VTI:            0.201 m AV Peak Grad:      4.7 mmHg AV Mean Grad:      3.0 mmHg LVOT Vmax:         81.60 cm/s LVOT Vmean:        52.200 cm/s LVOT VTI:          0.153 m LVOT/AV VTI ratio: 0.76  AORTA Ao Root diam: 3.50 cm Ao Asc diam:  3.30 cm MITRAL VALVE MV Area (PHT): 2.80 cm    SHUNTS MV Area VTI:   2.52 cm    Systemic VTI:  0.15 m MV Peak grad:  2.5 mmHg    Systemic Diam: 2.20 cm MV Mean grad:  1.0 mmHg MV Vmax:       0.79 m/s MV Vmean:      43.0 cm/s MV Decel Time: 271 msec MV E velocity: 52.90 cm/s MV A velocity: 58.80 cm/s MV E/A ratio:  0.90 Fransico Him MD Electronically signed by Fransico Him MD Signature Date/Time: 02/17/2020/6:55:55 PM    Final    CT HEAD CODE STROKE WO CONTRAST  Result Date: 02/16/2020 CLINICAL DATA:  Code stroke.  Acute neuro deficit EXAM: CT HEAD WITHOUT CONTRAST TECHNIQUE: Contiguous axial images were obtained from the base of the skull through the vertex without intravenous contrast. COMPARISON:  None. FINDINGS: Brain: Well-defined hypodensity in the right corona radiata compatible with chronic white matter infarct. Negative for acute infarct, hemorrhage, or mass. Ventricle size normal. Vascular: Negative for hyperdense vessel. Atherosclerotic  calcification in the carotid and vertebral arteries. Skull: Negative Sinuses/Orbits: Paranasal sinuses clear.  Negative orbit Other: None ASPECTS ([place] Stroke Program [place] CT Score) - Ganglionic level infarction (caudate, lentiform nuclei, internal capsule, insula, M1-M3 cortex): 7 - Supraganglionic infarction (M4-M6 cortex): 3 Total score (0-10 with 10 being normal): 10 IMPRESSION: 1. No acute abnormality identified 2. ASPECTS is 10 3. Chronic infarct in the right corona radiata. 4. Code stroke imaging results were communicated on 02/16/2020 at 6:06 pm to provider Bhagat via text page Electronically Signed   By: Franchot Gallo M.D.   On: 02/16/2020 18:07   CT ANGIO HEAD CODE STROKE  Result Date: 02/16/2020 CLINICAL DATA:  Acute neuro deficit. EXAM: CT ANGIOGRAPHY HEAD AND NECK TECHNIQUE: Multidetector CT imaging of the head and neck was performed using the standard protocol during bolus administration of intravenous contrast. Multiplanar CT image reconstructions and MIPs were obtained to evaluate the vascular anatomy. Carotid stenosis measurements (when applicable) are obtained utilizing NASCET criteria, using the distal internal carotid diameter as the denominator. CONTRAST:  66mL OMNIPAQUE IOHEXOL 350 MG/ML SOLN COMPARISON:  CT head 02/16/2020 FINDINGS: CTA NECK FINDINGS Aortic arch: Standard branching. Imaged portion shows no evidence of aneurysm or dissection. No significant stenosis of the major arch vessel origins. Right carotid system: Mild atherosclerotic disease right carotid bifurcation without stenosis. Left carotid system: Mild atherosclerotic disease left carotid bifurcation without stenosis. Vertebral arteries: Both vertebral arteries are patent to the basilar without stenosis. Skeleton: Cervical spondylosis.  No acute skeletal abnormality. Other neck: Negative for mass or adenopathy. 8 mm calcification right thyroid nodule. No further imaging necessary. Upper chest: Lung apices clear  bilaterally. Review of the MIP images confirms the above findings CTA HEAD FINDINGS Anterior circulation: Mild atherosclerotic disease in the cavernous carotid bilaterally without stenosis. Anterior and middle cerebral arteries patent bilaterally without large vessel occlusion. Mild atherosclerotic irregularity in the anterior and middle cerebral arteries bilaterally without significant stenosis. Posterior circulation: Both vertebral arteries patent to the basilar. PICA patent bilaterally. Basilar widely patent. Superior cerebellar arteries patent bilaterally. Moderate stenosis P2 segment bilaterally. No large vessel occlusion Venous sinuses: Normal venous enhancement. Anatomic variants: None Review of the MIP images confirms the above findings IMPRESSION: 1. Negative for intracranial large vessel occlusion 2. Intracranial atherosclerotic disease with moderate stenosis in the P2 segment bilaterally. Mild atherosclerotic irregularity in the anterior and middle cerebral arteries. 3. No significant carotid or vertebral artery stenosis in the neck. Electronically Signed   By: Franchot Gallo M.D.   On: 02/16/2020 18:41   CT ANGIO NECK CODE STROKE  Result Date: 02/16/2020 CLINICAL DATA:  Acute neuro deficit. EXAM: CT ANGIOGRAPHY HEAD AND NECK TECHNIQUE: Multidetector CT imaging of the head and neck was performed using the standard protocol during bolus administration of intravenous contrast. Multiplanar CT image reconstructions and MIPs were obtained to evaluate the vascular anatomy. Carotid stenosis measurements (when applicable) are obtained utilizing NASCET criteria, using the distal internal carotid diameter as the denominator. CONTRAST:  20mL OMNIPAQUE IOHEXOL 350 MG/ML SOLN COMPARISON:  CT head 02/16/2020 FINDINGS: CTA NECK FINDINGS Aortic arch: Standard branching. Imaged portion shows no evidence of aneurysm or dissection.  No significant stenosis of the major arch vessel origins. Right carotid system: Mild  atherosclerotic disease right carotid bifurcation without stenosis. Left carotid system: Mild atherosclerotic disease left carotid bifurcation without stenosis. Vertebral arteries: Both vertebral arteries are patent to the basilar without stenosis. Skeleton: Cervical spondylosis.  No acute skeletal abnormality. Other neck: Negative for mass or adenopathy. 8 mm calcification right thyroid nodule. No further imaging necessary. Upper chest: Lung apices clear bilaterally. Review of the MIP images confirms the above findings CTA HEAD FINDINGS Anterior circulation: Mild atherosclerotic disease in the cavernous carotid bilaterally without stenosis. Anterior and middle cerebral arteries patent bilaterally without large vessel occlusion. Mild atherosclerotic irregularity in the anterior and middle cerebral arteries bilaterally without significant stenosis. Posterior circulation: Both vertebral arteries patent to the basilar. PICA patent bilaterally. Basilar widely patent. Superior cerebellar arteries patent bilaterally. Moderate stenosis P2 segment bilaterally. No large vessel occlusion Venous sinuses: Normal venous enhancement. Anatomic variants: None Review of the MIP images confirms the above findings IMPRESSION: 1. Negative for intracranial large vessel occlusion 2. Intracranial atherosclerotic disease with moderate stenosis in the P2 segment bilaterally. Mild atherosclerotic irregularity in the anterior and middle cerebral arteries. 3. No significant carotid or vertebral artery stenosis in the neck. Electronically Signed   By: Franchot Gallo M.D.   On: 02/16/2020 18:41    Labs:  Basic Metabolic Panel: Recent Labs  Lab 02/27/20 0522 03/02/20 0603  NA 136 140  K 4.4 4.6  CL 102 105  CO2 22 25  GLUCOSE 126* 123*  BUN 26* 24*  CREATININE 1.16 1.20  CALCIUM 9.1 9.4    CBC: Recent Labs  Lab 03/02/20 0603  WBC 12.7*  NEUTROABS 10.3*  HGB 13.6  HCT 39.4  MCV 88.1  PLT 426*    CBG: No results for  input(s): GLUCAP in the last 168 hours.  Family history.  Mother with ovarian cancer.  Father with CAD and hypertension.  Negative for colon cancer esophageal cancer rectal cancer or stomach cancer  Brief HPI:   Brent Heacock Sr. is a 70 y.o. right-handed male with history of gout, type 2 diabetes mellitus, hypertension, hyperlipidemia, quit smoking 54 years ago.  Per chart review patient lives with spouse.  Wife is an Therapist, sports.  One level home five steps to entry.  Independent prior to admission and active.  Presented 02/16/2020 with right side weakness and facial droop of acute onset.  Cranial CT scan negative for acute process.  Chronic infarct right corona radiata.  CT angiogram head and neck negative for large vessel occlusion.  Patient did receive TPA.  MRI showed acute infarction left basal ganglia and radiating white matter tracts.  No mass-effect or hemorrhage.  Echocardiogram with ejection fraction of 60 to 65% no wall motion abnormalities grade 1 diastolic dysfunction.  Admission chemistries unremarkable except BUN 24 creatinine 1.41 WBC 10,800.  Neurology consulted maintain on aspirin 81 mg daily and Plavix 75 mg daily x3 weeks then Plavix alone.  Patient did undergo a loop recorder placement per cardiology services 02/19/2020.  Lovenox for DVT prophylaxis.  Therapy evaluations completed due to patient's right side weakness he was admitted for a comprehensive rehab program.   Hospital Course: Brent Cayer Sr. was admitted to rehab 02/21/2020 for inpatient therapies to consist of PT, ST and OT at least three hours five days a week. Past admission physiatrist, therapy team and rehab RN have worked together to provide customized collaborative inpatient rehab.  Pertaining to patient's left basal ganglia infarction likely secondary to  small vessel disease.  Patient remained on aspirin Plavix x3 weeks total and Plavix alone.  Subcutaneous Lovenox for DVT prophylaxis.  Status post loop recorder follow-up  cardiology services.  Patient with history of gout maintained on colchicine every other day question gout flareup colchicine increase to twice daily.  He did receive indomethacin x4 days as well as steroid taper.  Blood pressure controlled on Cozaar.  He would follow-up outpatient primary MD.  Mild tachycardia EKG unremarkable felt to be related to deconditioning.  Noted spasticity related to CVA good results with Flexeril as needed.  Crestor for hyperlipidemia.  Chart notes type 2 diabetes mellitus hemoglobin A1c 5.5 blood sugars within normal limits.   Blood pressures were monitored on TID basis and controlled     Rehab course: During patient's stay in rehab weekly team conferences were held to monitor patient's progress, set goals and discuss barriers to discharge. At admission, patient required minimal assist 125 feet right platform walker minimal assist sit to stand moderate assist stand pivot transfers.  Moderate assist for eating max assist grooming max assist toilet transfers  Physical exam.  Blood pressure 126/84 pulse 116 temperature 98 respirations 18 oxygen saturations 90% room air Constitutional.  No acute distress HEENT Head.  Normocephalic and atraumatic Eyes.  Pupils round and reactive to light no discharge.nystagmus Neck.  Supple nontender no JVD without thyromegaly Cardiac regular rate rhythm without extra sounds or murmur heard Abdomen.  Soft nontender positive bowel sounds without rebound Respiratory effort normal no respiratory distress without wheeze Skin.  Warm and dry Musculoskeletal.  Normal range of motion.  Right wrist with tenderness Neurologic.  Alert oriented follows commands.  Fair insight and awareness. Motor.  Left upper extremity left lower extremity 5/5 proximal distal Right upper extremity 3+/5 proximal to distal Right lower extremity 4+/5 proximal to distal  He/She  has had improvement in activity tolerance, balance, postural control as well as ability  to compensate for deficits. He/She has had improvement in functional use RUE/LUE  and RLE/LLE as well as improvement in awareness.  Patient ambulates contact-guard assist without assistive device.  Needs some cues for posturing.  Patient performed repeated repetitions of sit to stand supervision.  He is able to dress himself independently functional transfers and mobility with rolling walker contact-guard.  He can gather his belongings for ADLs.  Full family teaching completed plan discharge to home       Disposition: Discharged to home    Diet: Regular  Special Instructions: No driving smoking or alcohol  Medications at discharge 1.  Tylenol as needed 2.  Vitamin D 1000 units p.o. daily 3.  Plavix 75 mg p.o. daily 4.  Colchicine 0.6 mg p.o. every other day 5.  Flexeril 10 mg p.o. 3 times daily as needed muscle spasms 6.  Cozaar 100 mg p.o. daily 7.  Melatonin 3 mg p.o. nightly 8.  Protonix 40 mg p.o. nightly 9.  Crestor 20 mg p.o. daily 10.  Senokot S1 tablet p.o. twice daily  30-35 minutes were spent completing discharge summary and discharge planning Discharge Instructions    Ambulatory referral to Neurology   Complete by: As directed    An appointment is requested in approximately 4 weeks left basal ganglia infarction   Ambulatory referral to Neuropsychology   Complete by: As directed    Evaluate and treat left basal ganglia infarction   Ambulatory referral to Occupational Therapy   Complete by: As directed    Evaluate and treat   Ambulatory referral  to Physical Medicine Rehab   Complete by: As directed    Moderate complexity follow-up 1 to 2 weeks left basal ganglia infarction   Ambulatory referral to Physical Medicine Rehab   Complete by: As directed    Moderate complexity follow-up 1 to 2 weeks left basal ganglia infarction   Ambulatory referral to Physical Therapy   Complete by: As directed    Evaluate and treat       Follow-up Information    Constance Haw, MD Follow up.   Specialty: Cardiology Why: Call for appointment Contact information: [place] [address] [phone]        Bayard Hugger, NP Follow up.   Specialty: Physical Medicine and Rehabilitation Why: Office to call for appointment Contact information: [place] [address] [phone]               Signed: Cathlyn Parsons 03/04/2020, 5:17 AM

## 2020-03-02 NOTE — Progress Notes (Incomplete)
Patient ID: Brent Aloe Sr., male   DOB: 27-Aug-1950, 70 y.o.   MRN: [id number]   Loralee Pacas, MSW, [place] Office: [phone] Cell: [phone] Fax: [phone]

## 2020-03-03 ENCOUNTER — Other Ambulatory Visit (HOSPITAL_COMMUNITY): Payer: Self-pay | Admitting: Physician Assistant

## 2020-03-03 MED ORDER — COLCHICINE 0.6 MG PO TABS: 0.6000 mg | ORAL_TABLET | Freq: Two times a day (BID) | ORAL | 0 refills | Status: DC

## 2020-03-03 MED ORDER — ROSUVASTATIN CALCIUM 20 MG PO TABS: 20.0000 mg | ORAL_TABLET | Freq: Every day | ORAL | 0 refills | Status: DC

## 2020-03-03 MED ORDER — MELATONIN 3 MG PO TABS: 3.0000 mg | ORAL_TABLET | Freq: Every day | ORAL | 0 refills | Status: DC

## 2020-03-03 MED ORDER — VITAMIN D 25 MCG (1000 UNIT) PO TABS: 1000.0000 [IU] | ORAL_TABLET | Freq: Every day | ORAL | 0 refills | Status: AC

## 2020-03-03 MED ORDER — LOPERAMIDE HCL 2 MG PO CAPS
2.0000 mg | ORAL_CAPSULE | ORAL | Status: DC | PRN
  Administered 2020-03-03 (×3): 2 mg via ORAL
  Filled 2020-03-03 (×3): qty 1

## 2020-03-03 MED ORDER — CYCLOBENZAPRINE HCL 10 MG PO TABS
10.0000 mg | ORAL_TABLET | Freq: Three times a day (TID) | ORAL | 0 refills | Status: DC | PRN
Start: 2020-03-03 — End: 2020-03-03

## 2020-03-03 MED ORDER — CLOPIDOGREL BISULFATE 75 MG PO TABS: 75.0000 mg | ORAL_TABLET | Freq: Every day | ORAL | 1 refills | Status: DC

## 2020-03-03 MED ORDER — LOSARTAN POTASSIUM 100 MG PO TABS: 100.0000 mg | ORAL_TABLET | Freq: Every day | ORAL | 3 refills | Status: DC

## 2020-03-03 MED ORDER — ASPIRIN 81 MG PO TBEC: 81.0000 mg | DELAYED_RELEASE_TABLET | Freq: Every day | ORAL | 11 refills | Status: DC

## 2020-03-03 MED ORDER — PANTOPRAZOLE SODIUM 40 MG PO TBEC: 40.0000 mg | DELAYED_RELEASE_TABLET | Freq: Every day | ORAL | 0 refills | Status: DC

## 2020-03-03 MED FILL — MELATONIN 3 MG TABS: 3 | 60 days supply | Qty: 60 | Fill #0

## 2020-03-03 MED FILL — CLOPIDOGREL 75 MG TABLET: 75 | 30 days supply | Qty: 30 | Fill #0

## 2020-03-03 MED FILL — ROSUVASTATIN CALCIUM 20 MG: 20 | 30 days supply | Qty: 30 | Fill #0

## 2020-03-03 MED FILL — CYCLOBENZAPRINE HCL 10 MG T: 10 | 30 days supply | Qty: 90 | Fill #0

## 2020-03-03 MED FILL — PANTOPRAZOLE SOD DR 40 MG T: 40 | 30 days supply | Qty: 30 | Fill #0

## 2020-03-03 MED FILL — COLCHICINE 0.6 MG TABS: 0.6 | 30 days supply | Qty: 60 | Fill #0

## 2020-03-03 NOTE — Progress Notes (Signed)
Physical Therapy Discharge Summary  Patient Details  Name: Brent Ran Sr. MRN: [id number] Date of Birth: Jan 31, 1950  Today's Date: 03/03/2020   Patient has met 9 of 9 long term goals due to improved activity tolerance, improved balance, increased strength, functional use of  right upper extremity and right lower extremity and improved coordination.  Patient to discharge at an ambulatory level Supervision.   Patient's care partner is independent to provide the necessary physical assistance at discharge. Pt's wife has completed hands on family education and is safe to assist pt on discharge home.   Reasons goals not met: NA  Recommendation:  Patient will benefit from ongoing skilled PT services in outpatient setting to continue to advance safe functional mobility, address ongoing impairments in strength and balance, and minimize fall risk.  Equipment: rolling walker  Reasons for discharge: treatment goals met and discharge from hospital  Patient/family agrees with progress made and goals achieved: Yes  PT Discharge Precautions/Restrictions Precautions Precautions: Fall Precaution Comments: R hemiparesis Restrictions Weight Bearing Restrictions: No Vital Signs   Pain Pain Assessment Pain Scale: 0-10 Pain Score: 0-No pain Vision/Perception  Vision - History Baseline Vision: Wears glasses only for reading Perception Perception: Within Functional Limits Inattention/Neglect: Appears intact Praxis Praxis: Intact Praxis Impairment Details: Motor planning  Cognition Overall Cognitive Status: Within Functional Limits for tasks assessed Arousal/Alertness: Awake/alert Orientation Level: Oriented X4 Attention: Sustained Sustained Attention: Appears intact Memory: Appears intact Awareness: Appears intact Problem Solving: Appears intact Safety/Judgment: Appears intact Sensation Sensation Light Touch: Appears Intact Coordination Gross Motor Movements are Fluid and  Coordinated: No Fine Motor Movements are Fluid and Coordinated: No Motor  Motor Motor: Hemiplegia Motor - Discharge Observations: mild R hemiparesis  Mobility Bed Mobility Bed Mobility: Supine to Sit;Sit to Supine;Rolling Right;Rolling Left Rolling Right: Independent Rolling Left: Independent Supine to Sit: Independent Sit to Supine: Independent Transfers Transfers: Sit to Stand;Stand to Sit;Stand Pivot Transfers Sit to Stand: Independent with assistive device Stand to Sit: Independent with assistive device Stand Pivot Transfers: Independent with assistive device Transfer (Assistive device): Rolling walker Locomotion  Gait Ambulation: Yes Gait Assistance: Supervision/Verbal cueing Gait Distance (Feet): 500 Feet Assistive device: Rolling walker Gait Assistance Details: Verbal cues for gait pattern Gait Assistance Details: VCs for increased step height Gait Gait: Yes Gait Pattern: Impaired Gait Pattern: Decreased stride length;Poor foot clearance - right Gait velocity: reduced Stairs / Additional Locomotion Stairs: Yes Stairs Assistance: Supervision/Verbal cueing Stair Management Technique: Two rails;Alternating pattern;Step to pattern (Alternating ascending, step to descending) Number of Stairs: 16 Height of Stairs: 6 Ramp: Supervision/Verbal cueing Curb: Contact Guard/Touching assist Wheelchair Mobility Wheelchair Mobility: No  Trunk/Postural Assessment  Cervical Assessment Cervical Assessment: Within Functional Limits Thoracic Assessment Thoracic Assessment: Within Functional Limits Lumbar Assessment Lumbar Assessment: Within Functional Limits Postural Control Postural Control: Within Functional Limits  Balance Balance Balance Assessed: Yes Standardized Balance Assessment Standardized Balance Assessment: Timed Up and Go Test;Berg Balance Test Berg Balance Test Sit to Stand: Able to stand without using hands and stabilize independently Standing Unsupported:  Able to stand safely 2 minutes Sitting with Back Unsupported but Feet Supported on Floor or Stool: Able to sit safely and securely 2 minutes Stand to Sit: Sits safely with minimal use of hands Transfers: Able to transfer safely, minor use of hands Standing Unsupported with Eyes Closed: Able to stand 10 seconds safely Standing Ubsupported with Feet Together: Able to place feet together independently and stand 1 minute safely From Standing, Reach Forward with Outstretched Arm: Can reach confidently >25 cm (  10") From Standing Position, Pick up Object from Floor: Able to pick up shoe, needs supervision From Standing Position, Turn to Look Behind Over each Shoulder: Looks behind from both sides and weight shifts well Turn 360 Degrees: Able to turn 360 degrees safely but slowly Standing Unsupported, Alternately Place Feet on Step/Stool: Able to stand independently and complete 8 steps >20 seconds Standing Unsupported, One Foot in Front: Loses balance while stepping or standing Standing on One Leg: Able to lift leg independently and hold equal to or more than 3 seconds Total Score: 46 Static Sitting Balance Static Sitting - Balance Support: Feet supported Static Sitting - Level of Assistance: 7: Independent Dynamic Sitting Balance Dynamic Sitting - Balance Support: During functional activity Dynamic Sitting - Level of Assistance: 7: Independent Static Standing Balance Static Standing - Balance Support: During functional activity;No upper extremity supported Static Standing - Level of Assistance: 7: Independent Dynamic Standing Balance Dynamic Standing - Balance Support: No upper extremity supported;During functional activity Dynamic Standing - Level of Assistance: 5: Stand by assistance Extremity Assessment      RLE Assessment RLE Assessment: Within Functional Limits Passive Range of Motion (PROM) Comments: WFLs General Strength Comments: grossly 5/5, hip flexion 4/5 LLE Assessment LLE  Assessment: Within Functional Limits Passive Range of Motion (PROM) Comments: WFLs General Strength Comments: grossly 5/5, hip flexion 4+/5    Rissie Sculley, SPT 03/03/2020, 6:15 PM

## 2020-03-03 NOTE — Progress Notes (Signed)
Physical Therapy Session Note  Patient Details  Name: Brent Tamburrino Sr. MRN: [id number] Date of Birth: 08/23/1950  Today's Date: 03/03/2020 PT Individual Time:0800-0900, [id number] PT Individual Time Calculation (min): 60 min, 84 min   Short Term Goals: Week 1:  PT Short Term Goal 1 (Week 1): Pt will increase bed to S. PT Short Term Goal 1 - Progress (Week 1): Met PT Short Term Goal 2 (Week 1): Pt will incease transfers to min A. PT Short Term Goal 2 - Progress (Week 1): Met PT Short Term Goal 3 (Week 1): Pt will ambulate with LRAD about 50 feet with min A. PT Short Term Goal 3 - Progress (Week 1): Met PT Short Term Goal 4 (Week 1): Pt will ascend/descend 6 stairs with 1 rail and mod A. PT Short Term Goal 4 - Progress (Week 1): Met PT Short Term Goal 5 (Week 1): Pt will propel w/c about 150 feet with S. PT Short Term Goal 5 - Progress (Week 1): Met  Skilled Therapeutic Interventions/Progress Updates:    AM session: Pt seated in recliner on arrival and agreeable to therapy. Gait x 150 ft with RW and supervision to therapy gym to perform Berg balance test and strength/sensation testing, see documentation below. Gait x 100 ft with RW and supervision to perform car transfer with supervision. Pt navigated ramp and uneven surfaces with RW, supervision, and no LOB. Pt performed 8" curb step step x3 with RW and CGA without LOB. Gait training without AD, CGA x 200 ft. Utilized Geologist, engineering for visual cues, verbal cues to increase arm swing, and exaggerated practice. Pt returned to room and was left in recliner  with chair alarm active and all needs in reach.  PM session: Pt seated in recliner on arrival and agreeable to therapy. Pt ambulated with RW and supervision throughout session. Gait x 150 ft to therapy gym to perform 6" stairs with B HR and supervision 4 x 4. Pt ascended with reciprocal pattern and descended using step to pattern. Pt performed TUG test with with a score of 16.6 sec using RW and  supervision, improved from 38.9 sec on 2/8 and 22.5 on 2/12. Pt walked 796 ft during 6MWT, improved from 349 ft on 2/8 and 587 ft on 2/12, demonstrating only occasional catching of the R foot with fatigue after walking back to the gym for a total of 1600 ft. Pt was instructed and performed floor transfer x 2, with CGA and VCs for safety and technique. Pt educated on calling a family member to assist if he needs to get up from the floor. Pt able to stand from couch and recliner mod I, mod I for bed mobility in apartment. Pt states that he uses a steam cleaner at home. Pt pushed vacuum over 90+% of the apartment floor with his R hand and no AD and with close supervision, demonstrating side steps, backwards stepping, and more to maneuver vacuum with no LOB. Pt states it got easier to control with his wrist, even as he got tired. Pt engaged in standing balance activities with the wii after walking 200 ft. Pt returned to room, x300 ft. Pt's RW for home was adjusted and coban applied to R side for improved grip. Pt was left in recliner with all needs in reach and bed alarm active.   Therapy Documentation Precautions:  Precautions Precautions: Fall Precaution Comments: R hemiparesis Restrictions Weight Bearing Restrictions: No  Balance: Balance Balance Assessed: Yes Standardized Balance Assessment Standardized Balance Assessment: Timed Up  and Go Test;Berg Balance Test Berg Balance Test Sit to Stand: Able to stand without using hands and stabilize independently Standing Unsupported: Able to stand safely 2 minutes Sitting with Back Unsupported but Feet Supported on Floor or Stool: Able to sit safely and securely 2 minutes Stand to Sit: Sits safely with minimal use of hands Transfers: Able to transfer safely, minor use of hands Standing Unsupported with Eyes Closed: Able to stand 10 seconds safely Standing Ubsupported with Feet Together: Able to place feet together independently and stand 1 minute  safely From Standing, Reach Forward with Outstretched Arm: Can reach confidently >25 cm (10") From Standing Position, Pick up Object from Floor: Able to pick up shoe, needs supervision From Standing Position, Turn to Look Behind Over each Shoulder: Looks behind from both sides and weight shifts well Turn 360 Degrees: Able to turn 360 degrees safely but slowly Standing Unsupported, Alternately Place Feet on Step/Stool: Able to stand independently and complete 8 steps >20 seconds Standing Unsupported, One Foot in Front: Loses balance while stepping or standing Standing on One Leg: Able to lift leg independently and hold equal to or more than 3 seconds Total Score: 46  Therapy/Group: Individual Therapy  Sharen Counter, SPT 03/03/2020, 5:40 PM

## 2020-03-03 NOTE — Progress Notes (Signed)
2Occupational Therapy Discharge Summary  Patient Details  Name: Brent Kimmel Sr. MRN: [id number] Date of Birth: [birth year]   Patient has met 35 of 11 long term goals due to improved activity tolerance, improved balance, functional use of  RIGHT upper and RIGHT lower extremity, improved awareness and improved coordination.  Pt made excellent progress with BADLs and functional transfers. Pt is supervision for BADLs and all functional transfers. Pt uses RUE as gross assist with functional tasks. Pt's wife has been present and participated in therapy session.Patient to discharge at overall Supervision level.  Patient's care partner is independent to provide the necessary physical assistance at discharge.      Recommendation:  Patient will benefit from ongoing skilled OT services in outpatient setting to continue to advance functional skills in the area of BADL and Reduce care partner burden.  Equipment: No equipment provided pt purchasing shower seat   Reasons for discharge: treatment goals met and discharge from hospital  Patient/family agrees with progress made and goals achieved: Yes  OT Discharge Vision Baseline Vision/History: Wears glasses Wears Glasses: Reading only Patient Visual Report: No change from baseline Vision Assessment?: No apparent visual deficits Perception  Perception: Within Functional Limits Praxis Praxis: Intact Cognition Overall Cognitive Status: Within Functional Limits for tasks assessed Arousal/Alertness: Awake/alert Orientation Level: Oriented X4 Attention: Sustained Sustained Attention: Appears intact Memory: Appears intact Awareness: Appears intact Problem Solving: Appears intact Safety/Judgment: Appears intact Sensation Sensation Light Touch: Appears Intact Hot/Cold: Appears Intact Proprioception: Appears Intact Stereognosis: Not tested Coordination Gross Motor Movements are Fluid and Coordinated: No Fine Motor Movements are Fluid and  Coordinated: No Coordination and Movement Description: impaired RUE Finger Nose Finger Test: LUE WNL; RUE delayed Motor  Motor Motor: Hemiplegia Trunk/Postural Assessment  Cervical Assessment Cervical Assessment: Within Functional Limits Thoracic Assessment Thoracic Assessment: Within Functional Limits Lumbar Assessment Lumbar Assessment: Within Functional Limits Postural Control Postural Control: Within Functional Limits  Balance Static Sitting Balance Static Sitting - Balance Support: Feet supported Static Sitting - Level of Assistance: 6: Modified independent (Device/Increase time) Dynamic Sitting Balance Dynamic Sitting - Balance Support: During functional activity Dynamic Sitting - Level of Assistance: 5: Stand by assistance Extremity/Trunk Assessment RUE Assessment RUE Assessment: Exceptions to [facility] RUE Body System: Neuro Brunstrum levels for arm and hand: Arm;Hand Brunstrum level for arm: Stage V Relative Independence from Synergy Brunstrum level for hand: Stage VI Isolated joint movements LUE Assessment LUE Assessment: Within Functional Limits   [name] 03/03/2020, 6:23 AM

## 2020-03-03 NOTE — Patient Care Conference (Signed)
Inpatient RehabilitationTeam Conference and Plan of Care Update Date: 03/03/2020   Time: 11:11 AM    Patient Name: Brent Mccormick.      Medical Record Number: [id number]  Date of Birth: 1950-02-26 Sex: Male         Room/Bed: 4M10C/4M10C-01 Payor Info: Payor: [employer] / Plan:  [employer] / Product Type: *No Product type* /    Admit Date/Time:  02/21/2020  3:33 PM  Primary Diagnosis:  Infarction of left basal ganglia [facility])  Hospital Problems: Principal Problem:   Infarction of left basal ganglia (HCC) Active Problems:   Elevated BUN   Sleep disturbance    Expected Discharge Date: Expected Discharge Date: 03/04/20  Team Members Present: Physician leading conference: Dr. Courtney Heys Care Coodinator Present: Loralee Pacas, LCSWA;Nalah Macioce Creig Hines, RN, BSN, [place] Nurse Present: Isla Pence, RN PT Present: Excell Seltzer, PT OT Present: Roanna Epley, COTA;Jennifer Tamala Julian, OT PPS Coordinator present : Ileana Ladd, Burna Mortimer, SLP     Current Status/Progress Goal Weekly Team Focus  Bowel/Bladder   Pt continent of B/B LBM 03/02/2020  Pt to remain continent and have BMs every three days or less  Assess B/B every shift and PRN   Swallow/Nutrition/ Hydration             ADL's   supervision overall  supervision overall  discharge planning, safety awareness   Mobility   Gait- CGA with no AD, supervision with RW; transfers supervision, stairs CGA-supervision  S gait and transfers, CGA stairs  LE strength, eccentric control, balance   Communication             Safety/Cognition/ Behavioral Observations            Pain   Pt denies pain  pain scale <3/10  Assess pain every shift and PRN   Skin   Left chest loop recorder incision  No new breakdown  Assess skin every shift and PRN     Discharge Planning:  D/c to home with support from wife who will provide intermittent support (works S/S/M), and pt states he will have 24/7 support from various family members.  Fam edu completed on 2/11 10am-12pm.   Team Discussion: Patient has had 2 loose stools, stopped Senokot, still having spasticity. A&Ox4, continent B/B, right side residual weakness from previous stroke. Imodium given this morning for loose stool. Dressing is intact on chest. He is set up for outpatient therapy.   Patient on target to meet rehab goals: Contact guard/supervision for ambulation with RW, contact guard on stairs. ADL's are at goal level, working on fine motor.  *See Care Plan and progress notes for long and short-term goals.   Revisions to Treatment Plan:  None discussed.  Teaching Needs: Family education, medication management, skin/wound care, fine motor skills, transfer training, gait training.  Current Barriers to Discharge: Inaccessible home environment, Decreased caregiver support, Home enviroment access/layout, Lack of/limited family support, Medication compliance and Behavior  Possible Resolutions to Barriers: Continue current medications, provide emotional support to patient and family.     Medical Summary Current Status: d/c tomorrow; R hemi- improving; some loose stools this AM- LBM this AM; continent B/B- given Imodium x1; has loop recorder- C/D/I; gout flare resolved  Barriers to Discharge: Home enviroment access/layout;Medical stability;Other (comments)  Barriers to Discharge Comments: needs RW for d/c 2/16; mild R hemiparesis; can walk a little without RW- good for balance/RUE wgt bearing Possible Resolutions to [employer] Focus: CGA -Supervison stairs; step to pattern; OT- at goal level- daily  improvements on strength, fine motor coordination- d/c 2/16- set to go   Continued Need for Acute Rehabilitation Level of Care: The patient requires daily medical management by a physician with specialized training in physical medicine and rehabilitation for the following reasons: Direction of a multidisciplinary physical rehabilitation program to maximize functional  independence : Yes Medical management of patient stability for increased activity during participation in an intensive rehabilitation regime.: Yes Analysis of laboratory values and/or radiology reports with any subsequent need for medication adjustment and/or medical intervention. : Yes   I attest that I was present, lead the team conference, and concur with the assessment and plan of the team.   Cristi Loron 03/03/2020, 4:43 PM

## 2020-03-03 NOTE — Progress Notes (Addendum)
Patient ID: Brent Aloe Sr., male   DOB: 01-Feb-1950, 70 y.o.   MRN: [id number]  SW met with pt in room to provide updates from team conference on gains made, d/c remains tomorrow (2/16), and d/c recommendations: outpatient PT/OT and RW. Pt prefers [facility] outpatient therapy location. Pt also states that his wife has purchased a RW. When discussing neuropsych on schedule, SW explained pt was removed from schedule due to his current discharge and scheduling challenges. Pt open to meeting with someone. Pt informed an outpatient referral will be made. SW discussed with PA on above referral.   SW made contact with pt wife Brent Mccormick [phone]) to discuss above. She states she has not ordered RW. SW to order.   SW sent RW order to [place] [facility] via parachute.  Outpatient PT/OT referral sent to [facility] (p:[phone]/[phone]).  Loralee Pacas, MSW, [place] Office: [phone] Cell: [phone] Fax: [phone]

## 2020-03-03 NOTE — Progress Notes (Signed)
Occupational Therapy Session Note  Patient Details  Name: Brent Beckmann Sr. MRN: [id number] Date of Birth: 06/11/1950  Today's Date: 03/03/2020 OT Individual Time: 1000-1055 OT Individual Time Calculation (min): 55 min    Short Term Goals: Week 2:  OT Short Term Goal 2 (Week 2): STG=LTG 2/2 ELOS  Skilled Therapeutic Interventions/Progress Updates:    OT intervention with focus on RUE functional use. Activities/tasks included quadraped task with focus on WB through RUE while reaching for objects with LUE. Min A. Table tasks included grasping and placing pegs on peg board, removing/replacing medicine bottle caps, working with nuts/bolts, and in hand manipulation tasks with washers. Pt with improved elbow extension and grasp strength. Pt using RUE as gross assist with functional tasks. Pt returned to room and remained seated in recliner with seat alarm activated.  Therapy Documentation Precautions:  Precautions Precautions: Fall Precaution Comments: R hemiparesis Restrictions Weight Bearing Restrictions: No  Pain:  Pt stated his Lt toe was "much better"   Therapy/Group: Individual Therapy  [name] 03/03/2020, 11:29 AM

## 2020-03-03 NOTE — Telephone Encounter (Signed)
Filled out to the best of my ability. Likely will need specialists to fill out as well once he gets established with them

## 2020-03-03 NOTE — Discharge Instructions (Signed)
STROKE/TIA DISCHARGE INSTRUCTIONS SMOKING Cigarette smoking nearly doubles your risk of having a stroke & is the single most alterable risk factor  If you smoke or have smoked in the last 12 months, you are advised to quit smoking for your health.  Most of the excess cardiovascular risk related to smoking disappears within a year of stopping.  Ask you doctor about anti-smoking medications  [place] Quit Line: 1-800-QUIT NOW  Free Smoking Cessation Classes [phone]  CHOLESTEROL Know your levels; limit fat & cholesterol in your diet  Lipid Panel     Component Value Date/Time   CHOL 174 02/17/2020 0304   TRIG 64 02/17/2020 0304   HDL 40 (L) 02/17/2020 0304   CHOLHDL 4.4 02/17/2020 0304   VLDL 13 02/17/2020 0304   LDLCALC 121 (H) 02/17/2020 0304      Many patients benefit from treatment even if their cholesterol is at goal.  Goal: Total Cholesterol (CHOL) less than 160  Goal:  Triglycerides (TRIG) less than 150  Goal:  HDL greater than 40  Goal:  LDL (LDLCALC) less than 100   BLOOD PRESSURE American Stroke Association blood pressure target is less that 120/80 mm/Hg  Your discharge blood pressure is:  BP: (!) 156/96  Monitor your blood pressure  Limit your salt and alcohol intake  Many individuals will require more than one medication for high blood pressure  DIABETES (A1c is a blood sugar average for last 3 months) Goal HGBA1c is under 7% (HBGA1c is blood sugar average for last 3 months)  Diabetes: No known diagnosis of diabetes    Lab Results  Component Value Date   HGBA1C 5.5 02/17/2020     Your HGBA1c can be lowered with medications, healthy diet, and exercise.  Check your blood sugar as directed by your physician  Call your physician if you experience unexplained or low blood sugars.  PHYSICAL ACTIVITY/REHABILITATION Goal is 30 minutes at least 4 days per week  Activity: Increase activity slowly, Therapies: Physical Therapy: Home Health Return to work:    Activity decreases your risk of heart attack and stroke and makes your heart stronger.  It helps control your weight and blood pressure; helps you relax and can improve your mood.  Participate in a regular exercise program.  Talk with your doctor about the best form of exercise for you (dancing, walking, swimming, cycling).  DIET/WEIGHT Goal is to maintain a healthy weight  Your discharge diet is:  Diet Order            Diet Heart Room service appropriate? Yes with Assist; Fluid consistency: Thin  Diet effective now                 liquids Your height is:  Height: 5\' 10"  (177.8 cm) Your current weight is: Weight: 77.4 kg Your Body Mass Index (BMI) is:  BMI (Calculated): 24.48  Following the type of diet specifically designed for you will help prevent another stroke.  Your goal weight range is:    Your goal Body Mass Index (BMI) is 19-24.  Healthy food habits can help reduce 3 risk factors for stroke:  High cholesterol, hypertension, and excess weight.  RESOURCES Stroke/Support Group:  Call [phone]   STROKE EDUCATION PROVIDED/REVIEWED AND GIVEN TO PATIENT Stroke warning signs and symptoms How to activate emergency medical system (call 911). Medications prescribed at discharge. Need for follow-up after discharge. Personal risk factors for stroke. Pneumonia vaccine given:  Flu vaccine given:  My questions have been answered, the writing  is legible, and I understand these instructions.  I will adhere to these goals & educational materials that have been provided to me after my discharge from the hospital.   Inpatient Rehab Discharge Instructions  Brent Bayle Sr. Discharge date and time: No discharge date for patient encounter.   Activities/Precautions/ Functional Status: Activity: As tolerated Diet: Regular Wound Care: Routine skin checks Functional status:  ___ No restrictions     ___ Walk up steps independently ___ 24/7 supervision/assistance   ___ Walk up steps  with assistance ___ Intermittent supervision/assistance  ___ Bathe/dress independently ___ Walk with walker     _x__ Bathe/dress with assistance ___ Walk Independently    ___ Shower independently ___ Walk with assistance    ___ Shower with assistance ___ No alcohol     ___ Return to work/school ________   COMMUNITY REFERRALS UPON DISCHARGE:    Outpatient: PT    OT             Agency: [facility] Phone: [phone]             Appointment Date/Time: *Please expect follow-up within 7-10 business days to schedule appointment. If you have not received follow-up, be sure to contact the site directly.*  Medical Equipment/Items Ordered: rolling walker                                                 Agency/Supplier: [employer] [phone]    Special Instructions: No driving smoking or alcohol     My questions have been answered and I understand these instructions. I will adhere to these goals and the provided educational materials after my discharge from the hospital.  Patient/Caregiver Signature _______________________________ Date __________  Clinician Signature _______________________________________ Date __________  Please bring this form and your medication list with you to all your follow-up doctor's appointments.

## 2020-03-03 NOTE — Progress Notes (Signed)
[place] PHYSICAL MEDICINE & REHABILITATION PROGRESS NOTE  Subjective/Complaints:  Pt reports steroids "tore his stomach up" and has had 2 diarrhea BMs this AM- turned down laxatives normally, but got 1 dose last night- stopped senokot BID - will give imodium prn, if pt has more loose stools.   Doing last day of therapy today so doesn't want to miss therapy.   ROS:   Pt denies SOB, abd pain, CP, N/V/C/D, and vision changes    Objective: Vital Signs: Blood pressure (!) 141/86, pulse (!) 59, temperature 98 F (36.7 C), temperature source Oral, resp. rate 16, height 5\' 10"  (1.778 m), weight 77.4 kg, SpO2 99 %. No results found. Recent Labs    03/02/20 0603  WBC 12.7*  HGB 13.6  HCT 39.4  PLT 426*   Recent Labs    03/02/20 0603  NA 140  K 4.6  CL 105  CO2 25  GLUCOSE 123*  BUN 24*  CREATININE 1.20  CALCIUM 9.4    Intake/Output Summary (Last 24 hours) at 03/03/2020 0915 Last data filed at 03/03/2020 0522 Gross per 24 hour  Intake 300 ml  Output 700 ml  Net -400 ml        Physical Exam: BP (!) 141/86 (BP Location: Left Arm)   Pulse (!) 59   Temp 98 F (36.7 C) (Oral)   Resp 16   Ht 5\' 10"  (1.778 m)   Wt 77.4 kg   SpO2 99%   BMI 24.48 kg/m  Constitutional: sitting EOB, with PT in room; putting on pants, dressing on own, NAD HEENT: EOMI, oral membranes moist Neck: supple Cardiovascular: RRR_ no JVD   Respiratory: CTA B/L- no W/R/R- good air movement GI: Soft, NT, ND, (+)BS - hyperactive BS  Ext: no clubbing, cyanosis, or edema Psych: pleasant and cooperative Musc: Mild right wrist edema and tenderness.  L 2nd toe in swollen/red at tip only- not TTP anymore- less red/swollen Neuro: Ox3; MAS of 1 in RUE- wrist, elbow and shoulder Motor: LUE/LLE: 5/5 proximal distal RUE: 3+/5  Grip up to 4/5 and finger abd 2/5 now- wiggles fingers much better and can make fist now RLE: 4+/5 proximal to distal   Assessment/Plan: 1. Functional deficits which require 3+  hours per day of interdisciplinary therapy in a comprehensive inpatient rehab setting.  Physiatrist is providing close team supervision and 24 hour management of active medical problems listed below.  Physiatrist and rehab team continue to assess barriers to discharge/monitor patient progress toward functional and medical goals   Care Tool:  Bathing    Body parts bathed by patient: Right arm,Chest,Abdomen,Front perineal area,Right upper leg,Left upper leg,Left lower leg,Right lower leg,Face,Buttocks,Left arm   Body parts bathed by helper: Left arm,Right lower leg     Bathing assist Assist Level: Contact Guard/Touching assist     Upper Body Dressing/Undressing Upper body dressing   What is the patient wearing?: Pull over shirt    Upper body assist Assist Level: Set up assist    Lower Body Dressing/Undressing Lower body dressing      What is the patient wearing?: Pants,Underwear/pull up     Lower body assist Assist for lower body dressing: Contact Guard/Touching assist     Toileting Toileting    Toileting assist Assist for toileting: Minimal Assistance - Patient > 75%     Transfers Chair/bed transfer  Transfers assist     Chair/bed transfer assist level: Supervision/Verbal cueing     Locomotion Ambulation   Ambulation assist  Assist level: Contact Guard/Touching assist Assistive device: No Device Max distance: 300   Walk 10 feet activity   Assist     Assist level: Contact Guard/Touching assist Assistive device: No Device   Walk 50 feet activity   Assist    Assist level: Contact Guard/Touching assist Assistive device: No Device    Walk 150 feet activity   Assist Walk 150 feet activity did not occur: Safety/medical concerns  Assist level: Contact Guard/Touching assist Assistive device: No Device    Walk 10 feet on uneven surface  activity   Assist Walk 10 feet on uneven surfaces activity did not occur: Safety/medical  concerns   Assist level: Contact Guard/Touching assist Assistive device: Walker-rolling   Wheelchair     Assist Will patient use wheelchair at discharge?: Yes Type of Wheelchair: Manual    Wheelchair assist level: Minimal Assistance - Patient > 75% Max wheelchair distance: 75    Wheelchair 50 feet with 2 turns activity    Assist        Assist Level: Minimal Assistance - Patient > 75%   Wheelchair 150 feet activity     Assist      Assist Level: Moderate Assistance - Patient 50 - 74%   BP (!) 141/86 (BP Location: Left Arm)   Pulse (!) 59   Temp 98 F (36.7 C) (Oral)   Resp 16   Ht 5\' 10"  (1.778 m)   Wt 77.4 kg   SpO2 99%   BMI 24.48 kg/m   Medical Problem List and Plan: 1.  Right hemiparesis secondary to ischemic infarct left basal ganglia likely due to small vessel disease.  Status post loop recorder  2/14- no Lovneox after dc due to walking 600+ ft at a- time- strength improving in RUE esp  Continue CIR PT, OT 2.  Antithrombotics: -DVT/anticoagulation: Lovenox             -antiplatelet therapy: Aspirin 81 mg daily and Plavix 75 mg daily x3 weeks then Plavix alone 3. Pain Management: Tylenol as needed  2/8- having feet pain- will try tylenol AND Flexeril 10 mg TID prn  2/14- pain resolved- con't regimen 4. Mood: Provide emotional support  2/7- if mood doesn't improve, need to add an SSRI  2/9- staff don't feel needs SSRI at this time-con't to monitor             -antipsychotic agents: N/A 5. Neuropsych: This patient is capable of making decisions on his own behalf. 6. Skin/Wound Care: Routine skin checks 7. Fluids/Electrolytes/Nutrition: Routine in and outs            2/8- looking good- con't regimen- Cr stable at 1.09 8.  Hyperlipidemia: Crestor 9.  History of gout.  Colchicine 0.6 mg every other day.               Monitor for flares  2/9- GOUT FLARE- will change colchicine to 0.6 mg BID and add Indomethacin 75 mg BID x 4 days- if doesn't work,  might require steroids.   2/10- pain much better- will con't current regimen since working.  2/13- initiated low dose prednisone yesterday. Has helped. Continue x 3 more doses thru tomorrow morning  2/14- Sx's resolved- finishing steroids today- cause of WBC slightly elevated 10.  Essential hypertension.  Cozaar 100 mg daily  Mildly elevated on 2/6, monitor for persistence  2/7- BP 137/86- staying in 130s/80s- con't regimen  2/12-13 bp's controlled  2/15- BP 140s/80s- con't regimen- can be tweaked by PCP after  d/c.  12.  Tachycardia-likely secondary to deconditioning             ECG on 2/4 reviewed-normal  ?  Improving 13.  Leukocytosis             WBCs 11.1 on 2/4, CBC ordered for tomorrow  2/7- WBC 8.4- no signs/Sx's of infection- con't to monitor  2/14- WBC 12.7- due to steroids- will finish today             Afebrile, no signs/symptoms of infection   14.  Elevated BUN  2/7- BUN 22- slightly improved- con't to push PO fluids  2/9- will recheck tomorrow  2/10- BUN up to 26 from 22- have to really push PO- or will need IVFs.   Continue to Encourage fluids 15.  Sleep disturbance  Melatonin with benefit  2/7- "slept the best in weeks"- con't regimen  2/8- pain interfering with sleep- will add flexeril which can also make pts sleepy  2/9- slept poorly due to Gout pain- con't regimen and monitor  2/12-13  sleeping better   16. R wrist swelling/pain  2/7- improved with K-taping- con't regimen 17/ Spasticity  2/11- went over spasticity- how it occurs, prognosis, etc- won't treat unless painful or affects function- will monitor-  2/15- will need f/u after d/c with Zella Ball to f/u on spasticity and see if needs treatment- earlier than later.  18. Diarrhea  2/15- no risk factors for C Diff- will give Imodium prn 19. Dispo  2/15- d/c tomorrow- f/u with Zella Ball in [facility] appointment if possible to f/u on spasticity.      LOS: 11 days A FACE TO FACE EVALUATION WAS PERFORMED  Chavela Justiniano 03/03/2020, 9:15 AM

## 2020-03-03 NOTE — Telephone Encounter (Signed)
Spoke to [place] and informed her that I have faxed her paper work and received a confirmation.  Nothing further needed.

## 2020-03-03 NOTE — Telephone Encounter (Signed)
Brent Mccormick, did you fax this?

## 2020-03-04 MED FILL — VITAMIN D3 25 MCG (1000 UT): 25 MCG | 100 days supply | Qty: 100 | Fill #0

## 2020-03-04 NOTE — Progress Notes (Signed)
PHYSICAL MEDICINE & REHABILITATION PROGRESS NOTE  Subjective/Complaints:  Ready for d/c.   Discussed spasticity.  Needs to see Brent Sensing NP for spasticity treatment.   ROS:    Pt denies SOB, abd pain, CP, N/V/C/D, and vision changes     Objective: Vital Signs: Blood pressure 124/79, pulse 66, temperature 98 F (36.7 C), resp. rate 16, height 5\' 10"  (1.778 m), weight 77.4 kg, SpO2 98 %. No results found. Recent Labs    03/02/20 0603  WBC 12.7*  HGB 13.6  HCT 39.4  PLT 426*   Recent Labs    03/02/20 0603  NA 140  K 4.6  CL 105  CO2 25  GLUCOSE 123*  BUN 24*  CREATININE 1.20  CALCIUM 9.4    Intake/Output Summary (Last 24 hours) at 03/04/2020 3532 Last data filed at 03/03/2020 1850 Gross per 24 hour  Intake 420 ml  Output 150 ml  Net 270 ml        Physical Exam: BP 124/79 (BP Location: Left Arm)   Pulse 66   Temp 98 F (36.7 C)   Resp 16   Ht 5\' 10"  (1.778 m)   Wt 77.4 kg   SpO2 98%   BMI 24.48 kg/m  Constitutional: laying in bed; appropriate, NAD HEENT: EOMI, oral membranes moist Neck: supple Cardiovascular: RRR- no JVD  Respiratory: CTA B/L- no W/R/R- good air movement GI: Soft, NT, ND, (+)BS  Ext: no clubbing, cyanosis, or edema Psych: pleasant and cooperative- appropriate Musc: Mild right wrist edema and tenderness.  L 2nd toe in swollen/red at tip only- not TTP anymore- less red/swollen Neuro: Ox3; MAS of RUE in all joints- stable Motor: LUE/LLE: 5/5 proximal distal RUE: 3+/5  Grip up to 4/5 and finger abd 2/5 now- wiggles fingers much better and can make fist now RLE: 4+/5 proximal to distal   Assessment/Plan: 1. Functional deficits which require 3+ hours per day of interdisciplinary therapy in a comprehensive inpatient rehab setting.  Physiatrist is providing close team supervision and 24 hour management of active medical problems listed below.  Physiatrist and rehab team continue to assess barriers to  discharge/monitor patient progress toward functional and medical goals   Care Tool:  Bathing    Body parts bathed by patient: Right arm,Chest,Abdomen,Front perineal area,Right upper leg,Left upper leg,Left lower leg,Right lower leg,Face,Buttocks,Left arm   Body parts bathed by helper: Left arm,Right lower leg     Bathing assist Assist Level: Supervision/Verbal cueing     Upper Body Dressing/Undressing Upper body dressing   What is the patient wearing?: Pull over shirt    Upper body assist Assist Level: Independent with assistive device    Lower Body Dressing/Undressing Lower body dressing      What is the patient wearing?: Pants,Underwear/pull up     Lower body assist Assist for lower body dressing: Supervision/Verbal cueing     Toileting Toileting    Toileting assist Assist for toileting: Supervision/Verbal cueing     Transfers Chair/bed transfer  Transfers assist     Chair/bed transfer assist level: Independent with assistive device     Locomotion Ambulation   Ambulation assist      Assist level: Supervision/Verbal cueing Assistive device: Walker-rolling Max distance: 1400   Walk 10 feet activity   Assist     Assist level: Supervision/Verbal cueing Assistive device: Walker-rolling   Walk 50 feet activity   Assist    Assist level: Supervision/Verbal cueing Assistive device: Walker-rolling    Walk 150 feet activity  Assist Walk 150 feet activity did not occur: Safety/medical concerns  Assist level: Supervision/Verbal cueing Assistive device: No Device    Walk 10 feet on uneven surface  activity   Assist Walk 10 feet on uneven surfaces activity did not occur: Safety/medical concerns   Assist level: Supervision/Verbal cueing Assistive device: Aeronautical engineer Will patient use wheelchair at discharge?: No Type of Wheelchair: Manual    Wheelchair assist level: Minimal Assistance - Patient >  75% Max wheelchair distance: 75    Wheelchair 50 feet with 2 turns activity    Assist        Assist Level: Minimal Assistance - Patient > 75%   Wheelchair 150 feet activity     Assist      Assist Level: Moderate Assistance - Patient 50 - 74%   BP 124/79 (BP Location: Left Arm)   Pulse 66   Temp 98 F (36.7 C)   Resp 16   Ht 5\' 10"  (1.778 m)   Wt 77.4 kg   SpO2 98%   BMI 24.48 kg/m   Medical Problem List and Plan: 1.  Right hemiparesis secondary to ischemic infarct left basal ganglia likely due to small vessel disease.  Status post loop recorder  2/14- no Lovneox after dc due to walking 600+ ft at a- time- strength improving in RUE esp  Continue CIR PT, OT 2.  Antithrombotics: -DVT/anticoagulation: Lovenox             -antiplatelet therapy: Aspirin 81 mg daily and Plavix 75 mg daily x3 weeks then Plavix alone 3. Pain Management: Tylenol as needed  2/8- having feet pain- will try tylenol AND Flexeril 10 mg TID prn  2/14- pain resolved- con't regimen 4. Mood: Provide emotional support  2/7- if mood doesn't improve, need to add an SSRI  2/9- staff don't feel needs SSRI at this time-con't to monitor             -antipsychotic agents: N/A 5. Neuropsych: This patient is capable of making decisions on his own behalf. 6. Skin/Wound Care: Routine skin checks 7. Fluids/Electrolytes/Nutrition: Routine in and outs            2/8- looking good- con't regimen- Cr stable at 1.09 8.  Hyperlipidemia: Crestor 9.  History of gout.  Colchicine 0.6 mg every other day.               Monitor for flares  2/9- GOUT FLARE- will change colchicine to 0.6 mg BID and add Indomethacin 75 mg BID x 4 days- if doesn't work, might require steroids.   2/10- pain much better- will con't current regimen since working.  2/13- initiated low dose prednisone yesterday. Has helped. Continue x 3 more doses thru tomorrow morning  2/14- Sx's resolved- finishing steroids today- cause of WBC slightly  elevated  2/16- back on colchicine QOD 10.  Essential hypertension.  Cozaar 100 mg daily  Mildly elevated on 2/6, monitor for persistence  2/7- BP 137/86- staying in 130s/80s- con't regimen  2/12-13 bp's controlled  2/15- BP 140s/80s- con't regimen- can be tweaked by PCP after d/c.  12.  Tachycardia-likely secondary to deconditioning             ECG on 2/4 reviewed-normal  ?  Improving 13.  Leukocytosis             WBCs 11.1 on 2/4, CBC ordered for tomorrow  2/7- WBC 8.4- no signs/Sx's of infection- con't to monitor  2/14- WBC 12.7- due to steroids- will finish today             Afebrile, no signs/symptoms of infection   14.  Elevated BUN  2/7- BUN 22- slightly improved- con't to push PO fluids  2/9- will recheck tomorrow  2/10- BUN up to 26 from 22- have to really push PO- or will need IVFs.   Continue to Encourage fluids 15.  Sleep disturbance  Melatonin with benefit  2/7- "slept the best in weeks"- con't regimen  2/8- pain interfering with sleep- will add flexeril which can also make pts sleepy  2/9- slept poorly due to Gout pain- con't regimen and monitor  2/12-13  sleeping better   16. R wrist swelling/pain  2/7- improved with K-taping- con't regimen 17/ Spasticity  2/11- went over spasticity- how it occurs, prognosis, etc- won't treat unless painful or affects function- will monitor-  2/15- will need f/u after d/c with Zella Ball to f/u on spasticity and see if needs treatment- earlier than later.  2/16- MAS still 1 in RUE/RLE- no meds yet- might do if impairs function/painful 18. Diarrhea  2/15- no risk factors for C Diff- will give Imodium prn 19. Dispo  2/15- d/c tomorrow- f/u with Zella Ball in [facility] appointment if possible to f/u on spasticity.   2/16- d/w pt needs to do TC appointment with NP about spasticity-     LOS: 12 days A FACE TO FACE EVALUATION WAS PERFORMED  Brent Mccormick 03/04/2020, 8:22 AM

## 2020-03-04 NOTE — Progress Notes (Signed)
Pt discharged to home with personal belongings, discharge instructions provided to pt and wife by Marlowe Shores, PA, pt verbalized an understanding of home medications, discharge instructions and follow up visits.

## 2020-03-04 NOTE — Progress Notes (Signed)
Inpatient Rehabilitation Care Coordinator Discharge Note  The overall goal for the admission was met for:   Discharge location: Yes. D/c to home with 24/7 care from wife, and various family members.   Length of Stay: Yes. 11 days.   Discharge activity level: Yes. Supervision.  Home/community participation: Yes. Limited.   Services provided included: MD, RD, PT, OT, RN, CM, TR, Pharmacy, Neuropsych and SW  Financial Services: Private Insurance: [employer] UMR and Other: Medicare Part A  Choices offered to/list presented to:Yes  Follow-up services arranged: Outpatient: [facility] for Outpatient PT/OT and DME: RW  Comments (or additional information): contact pt [phone] or pt wife [name] [phone]  Patient/Family verbalized understanding of follow-up arrangements: Yes  Individual responsible for coordination of the follow-up plan: Pt to have assistance from his wife coordinating care needs.   Confirmed correct DME delivered: Rana Snare 03/04/2020    Rana Snare

## 2020-03-05 ENCOUNTER — Ambulatory Visit: Payer: Self-pay

## 2020-03-05 ENCOUNTER — Other Ambulatory Visit: Payer: Self-pay | Admitting: *Deleted

## 2020-03-05 NOTE — Patient Outreach (Signed)
[facility]) Care Management  03/05/2020  Jamespaul Secrist Sr. [birth year] [id number]   Transition of care telephone call  Referral received:02/20/20 Initial outreach:03/05/20 Insurance: Medicare A, UMR   Initial unsuccessful telephone call to patient's preferred number in order to complete transition of care assessment; no answer, left HIPAA compliant voicemail message requesting return call.   Objective: Per the electronic medical record, Mr. Jonmichael Beadnell  was hospitalized at [facility] 1/30-02/21/20 for Left sided lacunar infarction , right hemiparesis,loop recorder placed, he was discharged to inpatient rehab at [facility] 2/4- 03/04/20. Comorbidities include: Dyslipidemia, gout, hypertension,  He was discharged to home on 03/04/20  without the need for home health services, post discharge referrals to outpatient physical and occupational  Therapy, durable medical equipment of Rolling walker  per the discharge summary.   Plan: This RNCM will route unsuccessful outreach letter with [place] Management pamphlet and 24 hour Nurse Advice Line Magnet to [facility] [employer] clinical pool to be mailed to patient's home address. This RNCM will attempt another outreach within 4 business days.  Joylene Draft, RN, BSN  [place] Management Coordinator  [phone]- Mobile [phone]- Toll Free Main Office

## 2020-03-10 ENCOUNTER — Ambulatory Visit (INDEPENDENT_AMBULATORY_CARE_PROVIDER_SITE_OTHER): Payer: 59 | Admitting: Emergency Medicine

## 2020-03-10 ENCOUNTER — Other Ambulatory Visit: Payer: Self-pay | Admitting: *Deleted

## 2020-03-10 ENCOUNTER — Other Ambulatory Visit: Payer: Self-pay

## 2020-03-10 DIAGNOSIS — I639 Cerebral infarction, unspecified: Secondary | ICD-10-CM

## 2020-03-10 LAB — CUP PACEART INCLINIC DEVICE CHECK
Date Time Interrogation Session: 20220222162752
Implantable Pulse Generator Implant Date: 20220202

## 2020-03-10 NOTE — Progress Notes (Signed)
ILR wound check in clinic. Steri strips removed prior to visit. Wound well healed. Home monitor transmitting nightly. No episodes. Questions answered.

## 2020-03-10 NOTE — Patient Outreach (Signed)
[facility]) Care Management  03/10/2020  Brent Hippe Sr. [birth year] [id number]   Transition of care telephone call  Referral received:02/20/20 Initial outreach:03/05/20 Insurance: Medicare A, UMR     Noted patient with medical appointment on this afternoon, will reschedule transition of care call for today, until 03/11/20 for 2nd call attempt.    Joylene Draft, RN, BSN  [place] Management Coordinator  [phone]- Mobile [phone]- Toll Free Main Office

## 2020-03-11 ENCOUNTER — Other Ambulatory Visit: Payer: Self-pay | Admitting: *Deleted

## 2020-03-11 ENCOUNTER — Encounter: Payer: Self-pay | Admitting: *Deleted

## 2020-03-11 NOTE — Patient Outreach (Signed)
[place] [facility]) Care Management  03/11/2020  Jeffie Spivack Sr. Oct 01, 1950 [id number]       Transition of care call/case closure   Referral received: 02/20/20 Initial outreach:03/05/20 Insurance: Medicare A and UMR    Subjective: 2nd attempt successful telephone call to patient's preferred number in order to complete transition of care assessment. No answer able to leave a HIPAA compliant voice mail message for return call. Received return call from patient wife [name],  2 HIPAA identifiers verified. Explained purpose of call and completed transition of care assessment. I was able to speak with patient and wife.  Mr. Kosmicki reports that he is doing pretty good. He discussed continuing to use walker in home tolerating well, no falls. He reports still having right side weakness, no new stroke symptoms and understanding importance of seeking immediate for stroke warning symptoms. He  states being able to dress himself, putting on shoes and able to tie up shoes. He reports appetite being good and denies difficulty with meals. He state that he wife is assisting with managing his medication. He reports left chest area loop recorder site healing, reports attending clinic visit on yesterday for follow up.  tolerating diet, denies bowel or bladder problems.  Spouse/sister and children are assisting with his recovery.   Reviewed accessing the following [place] Benefits : Discussed  ongoing health issues of hypertension, hyperlipidemia and noted  enrolled in  [place] chronic disease management programs.  Wife states that she does have the hospital indemnity plan, provided contact number to [employer] to file a claim if needed.  He uses  a [facility] outpatient pharmacy at [facility].       Objective:  Mr. Valen Gillison  was hospitalized at [facility] 1/30-02/21/20 for Left sided lacunar infarction , right hemiparesis,loop recorder placed, he was discharged to  inpatient rehab at [facility] 2/4- 03/04/20. Comorbidities include: Dyslipidemia, gout, hypertension,  He was discharged to home on 03/04/20  without the need for home health services, post discharge referrals to outpatient physical and occupational  Therapy, durable medical equipment of Rolling walker  per the discharge summary.  Assessment:  Patient voices good understanding of all discharge instructions.  See transition of care flowsheet for assessment details.   Plan:  Reviewed hospital discharge diagnosis of Cerebral infarction   and discharge treatment plan using hospital discharge instructions, assessing medication adherence, reviewing problems requiring provider notification, and discussing the importance of follow up with surgeon, primary care provider and/or specialists as directed.  Reviewed [place] healthy lifestyle program information to receive discounted premium for  2023   Step 1: Get  your annual physical  Step 2: Complete your health assessment  Step 3:Identify your current health status and complete the corresponding action step between January 18, 2020 and September 17, 2020.    Using [place] website, verified that patient is an active participate in [place]'s Active Health Management chronic disease management program.    Patient and wife agreeable to follow up call in the next week regarding completing scheduling follow up outpatient appointments. Patient was routed outreach letter at initial unsuccessful outreach call.    Joylene Draft, RN, BSN  [place] Management Coordinator  [phone]- Mobile [phone]- Toll Free Main Office

## 2020-03-16 ENCOUNTER — Other Ambulatory Visit: Payer: Self-pay

## 2020-03-16 ENCOUNTER — Encounter: Payer: 59 | Attending: Registered Nurse | Admitting: Registered Nurse

## 2020-03-16 ENCOUNTER — Ambulatory Visit
Admission: RE | Admit: 2020-03-16 | Discharge: 2020-03-16 | Disposition: A | Discharge status: Home / Self Care | Payer: 59 | Source: Ambulatory Visit | Attending: Registered Nurse | Admitting: Registered Nurse

## 2020-03-16 ENCOUNTER — Encounter: Payer: Self-pay | Admitting: Registered Nurse

## 2020-03-16 VITALS — BP 134/91 | HR 103 | Temp 98.6°F | Ht 70.0 in | Wt 165.0 lb

## 2020-03-16 DIAGNOSIS — M19041 Primary osteoarthritis, right hand: Secondary | ICD-10-CM | POA: Diagnosis not present

## 2020-03-16 DIAGNOSIS — I10 Essential (primary) hypertension: Secondary | ICD-10-CM | POA: Insufficient documentation

## 2020-03-16 DIAGNOSIS — I639 Cerebral infarction, unspecified: Secondary | ICD-10-CM | POA: Insufficient documentation

## 2020-03-16 DIAGNOSIS — M79641 Pain in right hand: Secondary | ICD-10-CM | POA: Insufficient documentation

## 2020-03-16 DIAGNOSIS — G8191 Hemiplegia, unspecified affecting right dominant side: Secondary | ICD-10-CM | POA: Insufficient documentation

## 2020-03-16 NOTE — Progress Notes (Signed)
Subjective:    Patient ID: Brent Aloe Sr., male    DOB: [birth year], 70 y.o.   MRN: [id number]  HPI: Brent Brach. is a 70 y.o. male who is here for hospital follow up appointment of his Infarction of left basal ganglia, Right Hemiparesis, Essential Hypertension and right hand pain.  He came to [facility] on 02/16/2020 via EMS with complaints of right sided weakness and right facial droop. Neurology Consulted.  CT Head: WO Contrast:  IMPRESSION: 1. No acute abnormality identified 2. ASPECTS is 10 3. Chronic infarct in the right corona radiata. CT Angio Head and Neck:  IMPRESSION: 1. Negative for intracranial large vessel occlusion 2. Intracranial atherosclerotic disease with moderate stenosis in the P2 segment bilaterally. Mild atherosclerotic irregularity in the anterior and middle cerebral arteries. 3. No significant carotid or vertebral artery stenosis in the neck. MR Brain WO Contrast:  IMPRESSION: 1. Acute infarction in the left basal ganglia and radiating white matter tracts. No mass effect or hemorrhage. 2. Mild chronic small-vessel ischemic changes elsewhere affecting the cerebral hemispheric white matter and right basal ganglia.  Brent Mccormick was maintained on aspirin 81 mg and Plavix 75 mg daily x 3 weeks then Plavix alone.   Brent Mccormick was admitted to inpatient rehabilitation on 02/21/2020 and discharged home on 03/04/2020. Brent Mccormick reports he hadn't heard from outpatient therapy. Placed a call to Neuro- Rehabilitation and appointments were scheduled, he verbalizes understanding.  Brent Mccormick states he has pain in his right hand, he denies falling. X-ray ordered. He rates his pain 7.   Also reports he has a good appetite.   Pain Inventory Average Pain 7 Pain Right Now 7 My pain is intermittent, constant and stinging pain & soreness  LOCATION OF PAIN Right arm, pain in legs  BOWEL Number of stools per week: 7 Oral laxative use No  Type of laxative   Enema or suppository use No  History of colostomy No  Incontinent No   BLADDER Normal In and out cath, frequency N/A Able to self cath No  Bladder incontinence No  Frequent urination No  Leakage with coughing No  Difficulty starting stream No  Incomplete bladder emptying No    Mobility use a walker how many minutes can you walk? 15 MINS ability to climb steps?  yes do you drive?  no Do you have any goals in this area?  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  yes  Neuro/Psych weakness trouble walking  Prior Studies Any changes since last visit?  no New Patient  Physicians involved in your care Any changes since last visit?  no New Patient   Family History  Problem Relation Age of Onset  . Ovarian cancer Mother   . Heart disease Father   . Hypertension Father   . Colon polyps Sister 63  . Colon cancer Neg Hx   . Esophageal cancer Neg Hx   . Rectal cancer Neg Hx   . Stomach cancer Neg Hx    Social History   Socioeconomic History  . Marital status: Married    Spouse name: Not on file  . Number of children: Not on file  . Years of education: Not on file  . Highest education level: Not on file  Occupational History  . Not on file  Tobacco  Use  . Smoking status: Former Smoker    Quit date: 01/17/1966    Years since quitting: 54.1  . Smokeless tobacco: Never Used  Vaping Use  . Vaping Use: Never used  Substance and Sexual Activity  . Alcohol use: Yes    Comment: occassionally  . Drug use: No  . Sexual activity: Yes  Other Topics Concern  . 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    Married    Two children, live locally.       Three dogs and two cats    Social Determinants of Health   Financial  Resource Strain: Not on file  Food Insecurity: Not on file  Transportation Needs: Not on file  Physical Activity: Not on file  Stress: Not on file  Social Connections: Not on file   Past Surgical History:  Procedure Laterality Date  . COLONOSCOPY  2015   DJ-MAC-polyps  . LOOP RECORDER INSERTION N/A 02/19/2020   Procedure: LOOP RECORDER INSERTION;  Surgeon: Constance Haw, MD;  Location: [place] [facility];  Service: Cardiovascular;  Laterality: N/A;  . no prior surgery    . POLYPECTOMY  2015   DJ-MAC-polyps   Past Medical History:  Diagnosis Date  . Carpal tunnel syndrome   . Gout   . Hyperlipidemia    on meds  . Hypertension    on meds   There were no vitals taken for this visit.  Opioid Risk Score:   Fall Risk Score:  `1  Depression screen PHQ 2/9  Depression screen [facility] 2/9 06/11/2019 02/27/2018 01/20/2017 01/20/2016  Decreased Interest 0 0 0 0  Down, Depressed, Hopeless 0 0 - 0  PHQ - 2 Score 0 0 0 0   Review of Systems  Cardiovascular:       Loop recorder in-place  Musculoskeletal: Positive for gait problem.       Left shoulder pain, pain in both legs, right wrist pain, right arm pain  Skin:       Right wrist swelling  Neurological: Positive for weakness.  Psychiatric/Behavioral: Negative for sleep disturbance.  All other systems reviewed and are negative.      Objective:   Physical Exam Vitals and nursing note reviewed.  Constitutional:      Appearance: Normal appearance.  Cardiovascular:     Rate and Rhythm: Normal rate and regular rhythm.     Pulses: Normal pulses.     Heart sounds: Normal heart sounds.  Pulmonary:     Effort: Pulmonary effort is normal.     Breath sounds: Normal breath sounds.  Musculoskeletal:     Cervical back: Normal range of motion and neck supple.     Comments: Normal Muscle Bulk and Muscle Testing Reveals:  Upper Extremities: Right Upper Extremity: Decreased ROM 45 Degrees and Muscle Strength 0/5 Left Upper Extremity:  Full ROM and Muscle Strength 5/5  Lower Extremities: Full ROM and Muscle Strength 5/5 Arises from Table slowly using walker for support Narrow Based  Gait   Skin:    General: Skin is warm and dry.  Neurological:     Mental Status: He is alert and oriented to person, place, and time.  Psychiatric:        Mood and Affect: Mood normal.        Behavior: Behavior normal.           Assessment & Plan:  1. Left basal ganglia: Right Hemiparesis: He has a scheduled appointmentn with [facility]. He has a scheduled appointment with Neurology. Continue to monitor.  2. Essential Hypertension: Continue current medication regimen. PCP Following.  3. Right hand pain. RX: Xray.  F/U with Dr Dagoberto Ligas in 4- 6 weeks

## 2020-03-17 ENCOUNTER — Other Ambulatory Visit: Payer: Self-pay

## 2020-03-17 ENCOUNTER — Ambulatory Visit: Payer: 59 | Admitting: Occupational Therapy

## 2020-03-17 ENCOUNTER — Encounter: Payer: Self-pay | Admitting: Physical Therapy

## 2020-03-17 ENCOUNTER — Ambulatory Visit: Payer: 59 | Attending: Physician Assistant | Admitting: Physical Therapy

## 2020-03-17 VITALS — BP 110/80

## 2020-03-17 DIAGNOSIS — R2681 Unsteadiness on feet: Secondary | ICD-10-CM | POA: Insufficient documentation

## 2020-03-17 DIAGNOSIS — M6281 Muscle weakness (generalized): Secondary | ICD-10-CM | POA: Diagnosis not present

## 2020-03-17 DIAGNOSIS — R2689 Other abnormalities of gait and mobility: Secondary | ICD-10-CM | POA: Diagnosis not present

## 2020-03-17 DIAGNOSIS — R600 Localized edema: Secondary | ICD-10-CM | POA: Insufficient documentation

## 2020-03-17 DIAGNOSIS — M25531 Pain in right wrist: Secondary | ICD-10-CM | POA: Diagnosis not present

## 2020-03-17 DIAGNOSIS — I69351 Hemiplegia and hemiparesis following cerebral infarction affecting right dominant side: Secondary | ICD-10-CM | POA: Insufficient documentation

## 2020-03-17 NOTE — Therapy (Signed)
[facility] [address] [place], [place], [address] Phone: [phone]   Fax:  [phone]  Physical Therapy Evaluation  Patient Details  Name: Brent Alipio Sr. MRN: [id number] Date of Birth: December 31, 1950 Referring Provider (PT): Cathlyn Parsons, PA-C   Encounter Date: 03/17/2020   PT End of Session - 03/17/20 1427    Visit Number 1    Number of Visits 17    Date for PT Re-Evaluation 05/16/20    Authorization Type [employer]; $20 copay    PT Start Time 1324    PT Stop Time 1410    PT Time Calculation (min) 46 min    Activity Tolerance Patient tolerated treatment well    Behavior During Therapy [facility] for tasks assessed/performed           Past Medical History:  Diagnosis Date  . Carpal tunnel syndrome   . Gout   . Hyperlipidemia    on meds  . Hypertension    on meds    Past Surgical History:  Procedure Laterality Date  . COLONOSCOPY  2015   DJ-MAC-polyps  . LOOP RECORDER INSERTION N/A 02/19/2020   Procedure: LOOP RECORDER INSERTION;  Surgeon: Constance Haw, MD;  Location: [place] [facility];  Service: Cardiovascular;  Laterality: N/A;  . no prior surgery    . POLYPECTOMY  2015   DJ-MAC-polyps    Vitals:   03/17/20 1332  BP: 110/80      Subjective Assessment - 03/17/20 1328    Subjective Pt just discharged from CIR on 2/16; diagnosed with L basal ganglia CVA.   Having pain in R wrist and started having swelling in wrist/hand last Thursday; having more difficulty with gripping walker and is less able to assist with ADL.  Had x-ray yesterday, no results yet.  No falls since D/C home.  Has been ambulating around the house and outside with RW with supervision.  No issues with vision, swallowing, speech, etc.  No bowel or bladder issues.  No headaches.  No changes in cognition.    Patient is accompained by: Family member    Pertinent History carpal tunnel, HTN, HLD, DM, gout, and former smoker.     Patient Stated Goals Increase use of RUE; walk without the walker.    Currently in Pain? Yes    Pain Score 5     Pain Location Wrist    Pain Orientation Right              [facility] PT Assessment - 03/17/20 1340      Assessment   Medical Diagnosis L Basal ganglia CVA    Referring Provider (PT) Lavon Paganini Angiulli, PA-C    Onset Date/Surgical Date 02/16/20    Hand Dominance Right    Prior Therapy acute and CIR      Precautions   Precautions Other (comment)    Precaution Comments carpal tunnel, HTN, HLD, DM, gout, and former smoker.      Balance Screen   Has the patient fallen in the past 6 months No      [place] residence    Living Arrangements Spouse/significant other    Type of [place] to enter    Entrance Stairs-Number of Steps 8    Entrance Stairs-Rails Right;Left;Can reach both    [place] One level    [place] - 2 wheels;Shower seat      Prior Function  Level of Independence Independent    Vocation Full time employment    Teacher, English as a foreign language   Overall Cognitive Status Within Functional Limits for tasks assessed      Observation/Other Assessments   Focus on Therapeutic Outcomes (FOTO)  Stroke LE: 50%      Observation/Other Assessments-Edema    Edema --   R wrist     Sensation   Light Touch Appears Intact      ROM / Strength   AROM / PROM / Strength Strength      Strength   Overall Strength Deficits    Overall Strength Comments LLE soreness; RLE: 2+/5 hip flexion, 4-/5 knee flexion, extension ankle DF      Transfers   Transfers Sit to Stand;Stand to Sit    Sit to Stand 4: Min assist;5: Supervision    Sit to Stand Details (indicate cue type and reason) supervision when using LUE to push to stand; without UE pt requires multiple tries to stand with weight shift to LLE    Stand to Sit 5: Supervision;4: Min guard    Stand to Sit Details pt  able to control descent with LUE; without UE pt requires min guard to prevent posterior LOB      Ambulation/Gait   Ambulation/Gait Yes    Ambulation/Gait Assistance 5: Supervision;4: Min assist    Ambulation/Gait Assistance Details supervision with RW; min A without AD    Ambulation Distance (Feet) 30 Feet    Assistive device Rolling walker;None    Gait Pattern Step-through pattern;Decreased arm swing - right;Decreased step length - right;Decreased step length - left;Decreased stance time - right;Decreased stride length;Decreased stance time - left;Decreased hip/knee flexion - right;Decreased weight shift to right;Decreased weight shift to left;Poor foot clearance - right    Ambulation Surface Level;Indoor      Standardized Balance Assessment   Standardized Balance Assessment Five Times Sit to Stand;Timed Up and Go Test;Berg Balance Test    Five times sit to stand comments  30 second sit to stand: started with LUE and then changed to no UE, slight weight shift to L.  5.5 reps      Berg Balance Test   Sit to Stand Able to stand  independently using hands    Standing Unsupported Able to stand safely 2 minutes    Sitting with Back Unsupported but Feet Supported on Floor or Stool Able to sit safely and securely 2 minutes    Stand to Sit Controls descent by using hands    Transfers Able to transfer safely, minor use of hands    Standing Unsupported with Eyes Closed Able to stand 10 seconds with supervision    Standing Unsupported with Feet Together Needs help to attain position but able to stand for 30 seconds with feet together    From Standing, Reach Forward with Outstretched Arm Reaches forward but needs supervision    From Standing Position, Pick up Object from Floor Able to pick up shoe, needs supervision    From Standing Position, Turn to Look Behind Over each Shoulder Looks behind one side only/other side shows less weight shift    Turn 360 Degrees Needs close supervision or verbal cueing     Standing Unsupported, Alternately Place Feet on Step/Stool Needs assistance to keep from falling or unable to try    Standing Unsupported, One Foot in Front Needs help to step but can hold 15 seconds    Standing on One Leg Tries  to lift leg/unable to hold 3 seconds but remains standing independently    Total Score 32    Berg comment: 32/56      Timed Up and Go Test   TUG Normal TUG    Normal TUG (seconds) 21.8    TUG Comments first with RW and then without RW: 24.0 with slight imbalance in turns with delayed hesitation with starting gait after turn                      Objective measurements completed on examination: See above findings.               PT Education - 03/17/20 1426    Education Details clinical findings, fall risk, PT POC and goals; educated pt to continue to use ice, elevation, gentle massage and ROM exercises to manage R wrist/hand edema/pain; heating pad and gentle stretches to LLE for soreness    Person(s) Educated Patient;Spouse    Methods Explanation    Comprehension Verbalized understanding            PT Short Term Goals - 03/17/20 1439      PT SHORT TERM GOAL #1   Title Pt will demonstrate independence with initial balance/LE strengthening HEP    Time 4    Period Weeks    Status New    Target Date 04/16/20      PT SHORT TERM GOAL #2   Title Pt will increase 30 second sit to stand to 10 repetitions without use of UE to indicate increased functional LE strength    Baseline 5.5 reps    Time 4    Period Weeks    Status New    Target Date 04/16/20      PT SHORT TERM GOAL #3   Title Pt will decrease time to perform TUG with RW to </= 17 seconds and without RW to </= 20 seconds to indicate decreased falls risk    Baseline 21 seconds with RW, 24 seconds without RW    Time 4    Period Weeks    Status New    Target Date 04/16/20      PT SHORT TERM GOAL #4   Title Pt will increase BERG score by 5 points to indicate decreased  risk for falls    Baseline 32/56    Time 4    Period Weeks    Status New    Target Date 04/16/20      PT SHORT TERM GOAL #5   Title Pt will ambulate x 230' over level, indoor surfaces with L and R turns safely with supervision without RW    Time 4    Period Weeks    Status New    Target Date 04/16/20             PT Long Term Goals - 03/17/20 1444      PT LONG TERM GOAL #1   Title Pt will be independent with final HEP    Time 8    Period Weeks    Status New    Target Date 05/16/20      PT LONG TERM GOAL #2   Title Pt will report overall improvement in function on FOTO to >/= 64%    Baseline 50%    Time 8    Period Weeks    Status New    Target Date 05/16/20      PT LONG TERM GOAL #3   Title  Pt will ambulate x 500' outside over paved surfaces, negotiate curb and negotiate 8 stairs with one rail with supervision (no AD)    Time 8    Period Weeks    Status New    Target Date 05/16/20      PT LONG TERM GOAL #4   Title Pt will increase to 15 reps for 30 second sit to stand to indicate increased functional LE strength    Time 8    Period Weeks    Status New    Target Date 05/16/20      PT LONG TERM GOAL #5   Title Pt will decrease time to perform TUG without AD to </= 15 seconds    Time 8    Period Weeks    Status New    Target Date 05/16/20      Additional Long Term Goals   Additional Long Term Goals Yes      PT LONG TERM GOAL #6   Title Pt will increase BERG to >/= 42/56 to indicate decreased falls risk    Time 8    Period Weeks    Status New    Target Date 05/16/20                  Plan - 03/17/20 1429    Clinical Impression Statement Pt is a 70 year old male referred to Neuro OPPT for evaluation of R hemiparesis after L basal ganglia CVA.  Pt's PMH is significant for the following: carpal tunnel, HTN, HLD, DM, gout, and former smoker. The following deficits were noted during pt's exam: RUE and RLE weakness, pain and edema in RUE to be  addressed by OT tomorrow, soreness in LLE from altered gait, impaired static and dynamic standing balance, and abnormal gait.  Pt's BERG, TUG and 30 second sit to stand scores indicate pt is at high risk for falls. Pt would benefit from skilled PT to address these impairments and functional limitations to maximize functional mobility independence and reduce falls risk.    Personal Factors and Comorbidities Comorbidity 3+;Profession    Comorbidities carpal tunnel, HTN, HLD, DM, gout, and former smoker    Examination-Activity Limitations Locomotion Level;Transfers;Stairs    Examination-Participation Restrictions Driving;Occupation;Community Activity    Stability/Clinical Decision Making Evolving/Moderate complexity    Clinical Decision Making Moderate    Rehab Potential Good    PT Frequency 2x / week    PT Duration 8 weeks    PT Treatment/Interventions ADLs/Self Care Home Management;Aquatic Therapy;Cryotherapy;Electrical Stimulation;Moist Heat;DME Instruction;Gait training;Stair training;Functional mobility training;Therapeutic activities;Therapeutic exercise;Balance training;Neuromuscular re-education;Patient/family education;Orthotic Fit/Training;Manual techniques;Passive range of motion    PT Next Visit Plan If pt still having LLE soreness from weight shifting to L teach stretches to help relax leg before bed; initiate balance and strengthening for RLE weakness.  Gait training without RW especially turns and stairs    Recommended Other Services aquatic    Consulted and Agree with Plan of Care Patient           Patient will benefit from skilled therapeutic intervention in order to improve the following deficits and impairments:  Abnormal gait,Decreased balance,Decreased strength,Pain  Visit Diagnosis: Hemiplegia and hemiparesis following cerebral infarction affecting right dominant side (HCC)  Muscle weakness (generalized)  Other abnormalities of gait and mobility  Unsteadiness on  feet     Problem List Patient Active Problem List   Diagnosis Date Noted  . Elevated BUN   . Sleep disturbance   . Infarction of left basal  ganglia ([place]) 02/21/2020  . Right hemiparesis ([place])   . Leukocytosis   . Sinus tachycardia   . Dyslipidemia   . History of gout   . Left sided lacunar infarction ([place]) 02/16/2020  . Chronic hepatitis C without hepatic coma ([place]) 01/20/2016  . Lumbar disc disease with radiculopathy 03/07/2012  . Essential hypertension 12/14/2009  . RHINITIS 03/26/2008  . Gout 01/15/2007  . ERECTILE DYSFUNCTION 01/15/2007  . CARPAL TUNNEL SYNDROME, RIGHT 01/15/2007    Rico Junker, PT, DPT 03/17/20    2:49 PM    [facility] [address] [place], [place], [address] Phone: [phone]   Fax:  [phone]  Name: Brent Haverstock Sr. MRN: [id number] Date of Birth: [birth year]

## 2020-03-18 ENCOUNTER — Other Ambulatory Visit: Payer: Self-pay

## 2020-03-18 ENCOUNTER — Encounter: Payer: Self-pay | Admitting: Occupational Therapy

## 2020-03-18 ENCOUNTER — Ambulatory Visit: Payer: 59 | Admitting: Occupational Therapy

## 2020-03-18 ENCOUNTER — Other Ambulatory Visit: Payer: Self-pay | Admitting: *Deleted

## 2020-03-18 ENCOUNTER — Telehealth: Payer: Self-pay | Admitting: Registered Nurse

## 2020-03-18 DIAGNOSIS — R2681 Unsteadiness on feet: Secondary | ICD-10-CM | POA: Diagnosis not present

## 2020-03-18 DIAGNOSIS — I69351 Hemiplegia and hemiparesis following cerebral infarction affecting right dominant side: Secondary | ICD-10-CM | POA: Diagnosis not present

## 2020-03-18 DIAGNOSIS — M25531 Pain in right wrist: Secondary | ICD-10-CM

## 2020-03-18 DIAGNOSIS — R600 Localized edema: Secondary | ICD-10-CM | POA: Diagnosis not present

## 2020-03-18 DIAGNOSIS — M6281 Muscle weakness (generalized): Secondary | ICD-10-CM

## 2020-03-18 DIAGNOSIS — R2689 Other abnormalities of gait and mobility: Secondary | ICD-10-CM | POA: Diagnosis not present

## 2020-03-18 NOTE — Patient Outreach (Signed)
[place] [facility]) Care Management  03/18/2020  Brent Kloss Sr. October 15, 1950 [id number]   Transition of care follow up call    Transition of care /Case Closure Unsuccessful outreach    Referral received:02/20/20 Initial outreach:03/05/20 Insurance: Medicare A and UMR    Subjective: Unsuccessful follow up call to patient, no answer able to leave a HIPAA compliant voicemail message for return call.   Objective: Brent Mccormick hospitalized at [facility] 1/30-02/21/20 for Left sided lacunar infarction , right hemiparesis,loop recorder placed, he was discharged to inpatient rehab at [facility] 2/4- 03/04/20. Comorbidities include:Dyslipidemia, gout, hypertension,He was discharged to home on 2/16/22without the need for home health services, post discharge referrals to outpatient physical and occupational Therapy, durable medical equipmentof Rolling walkerper the discharge summary Plan Will plan follow up call in the next 4 business days.     Joylene Draft, RN, BSN  [place] Management Coordinator  [phone]- Mobile [phone]- Toll Free Main Office

## 2020-03-18 NOTE — Telephone Encounter (Signed)
X-ray result was reviewed with Dr. Dagoberto Mccormick.  Brent Mccormick needs to F/U with Ortho. Placed a call to Brent Mccormick regarding Dr Brent Mccormick recommendations, he verbalizes understanding.

## 2020-03-18 NOTE — Therapy (Signed)
[employer] [address] [place], [place], [address] Phone: [phone]   Fax:  [phone]  Occupational Therapy Evaluation  Patient Details  Name: Brent Sadler Sr. MRN: [id number] Date of Birth: 17-Nov-1950 Referring Provider (OT): Marlowe Shores   Encounter Date: 03/18/2020   OT End of Session - 03/18/20 1512    Visit Number 1    Number of Visits 25    Date for OT Re-Evaluation 06/16/20    Authorization Type [facility] [facility]    Progress Note Due on Visit 10    OT Start Time 1400    OT Stop Time 1445    OT Time Calculation (min) 45 min    Activity Tolerance Patient tolerated treatment well;Patient limited by pain    Behavior During Therapy [facility] for tasks assessed/performed           Past Medical History:  Diagnosis Date  . Carpal tunnel syndrome   . Gout   . Hyperlipidemia    on meds  . Hypertension    on meds    Past Surgical History:  Procedure Laterality Date  . COLONOSCOPY  2015   DJ-MAC-polyps  . LOOP RECORDER INSERTION N/A 02/19/2020   Procedure: LOOP RECORDER INSERTION;  Surgeon: Constance Haw, MD;  Location: [facility];  Service: Cardiovascular;  Laterality: N/A;  . no prior surgery    . POLYPECTOMY  2015   DJ-MAC-polyps    There were no vitals filed for this visit.   Subjective Assessment - 03/18/20 1457    Subjective  I had a little setback with this wrist.    Currently in Pain? Yes    Pain Score 4     Pain Location Wrist    Pain Orientation Right    Pain Descriptors / Indicators Aching    Pain Type Acute pain    Pain Onset In the past 7 days    Pain Frequency Constant    Aggravating Factors  bumping wrist    Pain Relieving Factors ice, support             [facility] OT Assessment - 03/18/20 0001      Assessment   Medical Diagnosis L Basal ganglia CVA    Referring Provider (OT) Linna Hoff Angiulli    Onset Date/Surgical Date 02/16/20    Hand Dominance Right    Prior Therapy  acute and CIR until 03/04/20      Precautions   Precautions Fall    Precaution Comments carpal tunnel, HTN, HLD, DM, gout, and former smoker.      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    Leisure read, buy and sell antiques for booth      ADL   Eating/Feeding Minimal assistance    Grooming Minimal assistance    Upper Body Bathing Minimal assistance    Lower Body Bathing Increased time    Upper Body Dressing Minimal assistance    Lower Body Dressing Minimal assistance    Toilet Transfer Modified independent    Toileting - Clothing Manipulation Increased time    Toileting -  Hygiene Minimal assistance    Tub/Shower Transfer Minimal assistance    ADL comments Attempting to use dominant right hand for ADL - was starting this, then had wrist flare up      IADL  Prior Level of Function Light Housekeeping Independent    Light Housekeeping Needs help with all home maintenance tasks    Prior Level of Function Meal Prep Independent    Meal Prep Does not utilize stove or oven      Written Expression   Dominant Hand Right    Handwriting --   has not yet tried - wrist pain     Vision - History   Baseline Vision Wears glasses only for reading      Vision Assessment   Eye Alignment Within Functional Limits      Cognition   Overall Cognitive Status Within Functional Limits for tasks assessed      Observation/Other Assessments   Focus on Therapeutic Outcomes (FOTO)  Stroke LE: 50%      Posture/Postural Control   Posture/Postural Control No significant limitations      Sensation   Light Touch Appears Intact    Stereognosis Impaired by gross assessment      Coordination   Gross Motor Movements are Fluid and Coordinated No    Fine Motor Movements are Fluid and Coordinated No    Box and Blocks unable      Tone    Assessment Location Right Upper Extremity      ROM / Strength   AROM / PROM / Strength AROM      AROM   Overall AROM  Deficits    Overall AROM Comments 70* flexion shoulder then compensations in trunk      Strength   Overall Strength Deficits    Overall Strength Comments Unable to sustain against gravity, 3-/5      Hand Function   Right Hand Gross Grasp Impaired    Right Hand Grip (lbs) unable      RUE Tone   RUE Tone Hypotonic                           OT Education - 03/18/20 1512    Education Details potential for improvement and potential OT goals    Person(s) Educated Patient;Spouse    Methods Explanation    Comprehension Verbalized understanding            OT Short Term Goals - 03/18/20 1522      OT SHORT TERM GOAL #1   Title Patient will complete a home exercise program designed to improve RUE range of motion    Time 4    Period Weeks    Status New    Target Date 05/02/20      OT SHORT TERM GOAL #2   Title Patient will utilize RUE to grasp release feeding utensil with min assist    Time 4    Period Weeks    Status New      OT SHORT TERM GOAL #3   Title Patient will tie shoes bimanually with increased time and min assist    Time 4    Period Weeks    Status New      OT SHORT TERM GOAL #4   Title Patient will zipper light weight jacket, or button 2 buttons on shirt with increased time and min assist    Time 4    Period Weeks    Status New      OT SHORT TERM GOAL #5   Title Patient will utilize BUE to wash a lightweight dish while standing at sink    Time 4    Period Weeks  Status New             OT Long Term Goals - 03/18/20 1526      OT LONG TERM GOAL #1   Title Patient will complete and updated HEP to address RUE ROM and functional strength    Time 12    Period Weeks    Status New    Target Date 06/16/20      OT LONG TERM GOAL #2   Title Patient will utilize BUE to weave chair seating (caning) with increased time  and intermittent support    Time 12    Period Weeks    Status New      OT LONG TERM GOAL #3   Title Patient will write a full sentence with RUE legibly    Time 8    Period Weeks    Status New      OT LONG TERM GOAL #4   Title Patient will reach into overhead cabinet to obtain a lightweight object (less than 3 lb) with RUE    Time 12    Period Weeks    Status New      OT LONG TERM GOAL #5   Title Patient willutilize mallet with RUE to tap to open joints of chair in furniture restoration project    Time 12    Period Weeks    Status New      Long Term Additional Goals   Additional Long Term Goals Yes      OT LONG TERM GOAL #6   Title Patient will demonstrate understanding of recommendations relating to retunring to driving    Time 12    Period Weeks    Status New                 Plan - 03/18/20 1513    Clinical Impression Statement Patient is a 70 yr old man who suffered a left BG stroke on 02/16/20, and received IP OT/PT in acute and Inpatient rehab.  He returned home from [place] on 03/04/20.  Patient with inflamed right wrist this visit.  Patient with history of gout - with recent gout flare (in his toe) in the hospital.  Patient reports pain, and swelling appear to be improving.  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  Patient hopes to return to work.  Patient will benefit from skilled OT intervention to address increasing range of motion, strength and functional use of his dominant RUE, as well as improving functional mobility overall.    OT Occupational Profile and History Detailed Assessment- Review of Records and additional review of physical, cognitive, psychosocial history related to current functional performance    Occupational performance deficits (Please refer to evaluation for details): ADL's;IADL's;Work    Body Structure / Function / Physical Skills  ADL;Coordination;Endurance;GMC;UE functional use;Balance;Decreased knowledge of precautions;Fascial restriction;Pain;IADL;Flexibility;Decreased knowledge of use of DME;Body mechanics;Dexterity;FMC;Strength;Tone;ROM    Rehab Potential Excellent    Clinical Decision Making Several treatment options, min-mod task modification necessary    Comorbidities Affecting Occupational Performance: May have comorbidities impacting occupational performance    Modification or Assistance to Complete Evaluation  Min-Moderate modification of tasks or assist with assess necessary to complete eval    OT Frequency 2x / week    OT Duration 12 weeks    OT Treatment/Interventions Self-care/ADL training;Electrical Stimulation;Therapeutic exercise;Aquatic Therapy;Moist Heat;Neuromuscular education;Compression bandaging;Splinting;Patient/family education;Balance training;Therapeutic activities;Functional Mobility Training;Fluidtherapy;Cryotherapy;Ultrasound;Contrast Bath;DME and/or AE instruction;Manual Therapy;Passive  range of motion    Plan Gentle range of motion to right wrist, edema management (looks like gout flare)  Check HEP from CIR - Would benefit from supine shoulder exercises.  Could try box and blocks if wrist less painful    Consulted and Agree with Plan of Care Patient;Family member/caregiver    Family Member Consulted Wife- Mary           Patient will benefit from skilled therapeutic intervention in order to improve the following deficits and impairments:   Body Structure / Function / Physical Skills: ADL,Coordination,Endurance,GMC,UE functional use,Balance,Decreased knowledge of precautions,Fascial restriction,Pain,IADL,Flexibility,Decreased knowledge of use of DME,Body mechanics,Dexterity,FMC,Strength,Tone,ROM       Visit Diagnosis: Hemiplegia and hemiparesis following cerebral infarction affecting right dominant side ([place]) - Plan: Ot plan of care cert/re-cert  Muscle weakness (generalized) - Plan: Ot  plan of care cert/re-cert  Pain in right wrist - Plan: Ot plan of care cert/re-cert  Localized edema - Plan: Ot plan of care cert/re-cert    Problem List Patient Active Problem List   Diagnosis Date Noted  . Elevated BUN   . Sleep disturbance   . Infarction of left basal ganglia ([place]) 02/21/2020  . Right hemiparesis ([place])   . Leukocytosis   . Sinus tachycardia   . Dyslipidemia   . History of gout   . Left sided lacunar infarction ([place]) 02/16/2020  . Chronic hepatitis C without hepatic coma ([place]) 01/20/2016  . Lumbar disc disease with radiculopathy 03/07/2012  . Essential hypertension 12/14/2009  . RHINITIS 03/26/2008  . Gout 01/15/2007  . ERECTILE DYSFUNCTION 01/15/2007  . CARPAL TUNNEL SYNDROME, RIGHT 01/15/2007    Mariah Milling, OTR/L 03/18/2020, 5:16 PM  [facility] [address] [place], [place], [address] Phone: [phone]   Fax:  [phone]  Name: Brent Priola Sr. MRN: [id number] Date of Birth: 13-Jan-1951

## 2020-03-19 ENCOUNTER — Telehealth: Payer: Self-pay | Admitting: Physical Medicine and Rehabilitation

## 2020-03-19 ENCOUNTER — Telehealth: Payer: 59 | Admitting: Adult Health

## 2020-03-19 ENCOUNTER — Other Ambulatory Visit: Payer: Self-pay | Admitting: Adult Health

## 2020-03-19 DIAGNOSIS — M109 Gout, unspecified: Secondary | ICD-10-CM | POA: Diagnosis not present

## 2020-03-19 MED ORDER — PREDNISONE 10 MG PO TABS: ORAL_TABLET | ORAL | 0 refills | Status: DC

## 2020-03-19 MED FILL — predniSONE 10 MG TABS: 10 | 10 days supply | Qty: 22 | Fill #0

## 2020-03-19 NOTE — Progress Notes (Signed)
Virtual Visit via Telephone Note  I connected with Brent Burkhead Sr. on 03/19/20 at 11:30 AM EST by telephone and verified that I am speaking with the correct person using two identifiers.   I discussed the limitations, risks, security and privacy concerns of performing an evaluation and management service by telephone and the availability of in person appointments. I also discussed with the patient that there may be a patient responsible charge related to this service. The patient expressed understanding and agreed to proceed.  Location patient: home Location provider: work or home office Participants present for the call: patient, provider Patient did not have a visit in the prior 7 days to address this/these issue(s).   History of Present Illness: 70 year old male who is being evaluated today for an acute gout flare.    As a side note, he was admitted to the hospital [facility] last month for left basal ganglia infarct.  Reports that he is doing well overall and has made tremendous progress.  While he was in the hospital he had an acute gout flare and was prescribed colchicine and prednisone.    In the past his acute gout flares have been well managed with taking colchicine 0.6 mg every other day.  He was taking the prednisone and colchicine together his acute gout flare was starting to resolve until he came off the prednisone and his gout flare has come back.  He continues to take colchicine 0.6 mg daily.  Reports currently having a gout flare in his right wrist and second toe on his left foot.  Reports that his toe is red, swollen, and painful.  Symptoms are consistent with previous gout flares.   Observations/Objective: Patient sounds cheerful and well on the phone. I do not appreciate any SOB. Speech and thought processing are grossly intact. Patient reported vitals:  Assessment and Plan: 1. Acute gout of multiple sites, unspecified cause -We will have him increase colchicine to 0.6 mg  twice daily until resolved and then can go back to taking every other day.  Also prescribe prednisone taper.  Follow-up if not improving - predniSONE (DELTASONE) 10 MG tablet; 40 mg x 3 days, 20 mg x 3 days, 10 mg x 4 days  Dispense: 22 tablet; Refill: 0   Follow Up Instructions:  I did not refer this patient for an OV in the next 24 hours for this/these issue(s).  I discussed the assessment and treatment plan with the patient. The patient was provided an opportunity to ask questions and all were answered. The patient agreed with the plan and demonstrated an understanding of the instructions.   The patient was advised to call back or seek an in-person evaluation if the symptoms worsen or if the condition fails to improve as anticipated.  I provided 13 minutes of non-face-to-face time during this encounter.   Dorothyann Peng, NP

## 2020-03-19 NOTE — Telephone Encounter (Signed)
Brent Mccormick patient is calling you back about getting medication for his gout..  I explained to him that he needs to call his primary doctor to get this medication.  He said that patient that his gout wasn't hurting him that bad, but this morning it is.  I again told him to call his primary back about this.

## 2020-03-19 NOTE — Telephone Encounter (Signed)
Return Mr. Tabor call, he states he has an appointment with his PCP.

## 2020-03-20 ENCOUNTER — Ambulatory Visit: Payer: 59 | Admitting: Occupational Therapy

## 2020-03-20 ENCOUNTER — Other Ambulatory Visit: Payer: Self-pay

## 2020-03-20 ENCOUNTER — Ambulatory Visit: Payer: 59

## 2020-03-20 DIAGNOSIS — I69351 Hemiplegia and hemiparesis following cerebral infarction affecting right dominant side: Secondary | ICD-10-CM | POA: Diagnosis not present

## 2020-03-20 DIAGNOSIS — M6281 Muscle weakness (generalized): Secondary | ICD-10-CM

## 2020-03-20 DIAGNOSIS — M25531 Pain in right wrist: Secondary | ICD-10-CM

## 2020-03-20 DIAGNOSIS — R600 Localized edema: Secondary | ICD-10-CM | POA: Diagnosis not present

## 2020-03-20 DIAGNOSIS — R2689 Other abnormalities of gait and mobility: Secondary | ICD-10-CM

## 2020-03-20 DIAGNOSIS — R2681 Unsteadiness on feet: Secondary | ICD-10-CM

## 2020-03-20 NOTE — Patient Instructions (Signed)
Access Code: [email or web address] URL: [email or web address] Date: 03/20/2020 Prepared by: Cherly Anderson  Exercises Forward Step Up - 2 x daily - 7 x weekly - 1 sets - 10 reps Walking March - 2 x daily - 7 x weekly - 1 sets - 3-4 reps Backward Walking with Counter Support - 2 x daily - 7 x weekly - 1 sets - 3-4 reps

## 2020-03-20 NOTE — Therapy (Signed)
[facility] [address] [place], [place], [address] Phone: [phone]   Fax:  [phone]  Physical Therapy Treatment  Patient Details  Name: Brent Deskins Sr. MRN: [id number] Date of Birth: July 10, 1950 Referring Provider (PT): Cathlyn Parsons, PA-C   Encounter Date: 03/20/2020   PT End of Session - 03/20/20 1459    Visit Number 2    Number of Visits 17    Date for PT Re-Evaluation 05/16/20    Authorization Type [employer]; $20 copay    PT Start Time 1455    PT Stop Time 1535    PT Time Calculation (min) 40 min    Activity Tolerance Patient tolerated treatment well    Behavior During Therapy [facility] for tasks assessed/performed           Past Medical History:  Diagnosis Date  . Carpal tunnel syndrome   . Gout   . Hyperlipidemia    on meds  . Hypertension    on meds    Past Surgical History:  Procedure Laterality Date  . COLONOSCOPY  2015   DJ-MAC-polyps  . LOOP RECORDER INSERTION N/A 02/19/2020   Procedure: LOOP RECORDER INSERTION;  Surgeon: Constance Haw, MD;  Location: [facility];  Service: Cardiovascular;  Laterality: N/A;  . no prior surgery    . POLYPECTOMY  2015   DJ-MAC-polyps    There were no vitals filed for this visit.   Subjective Assessment - 03/20/20 1500    Subjective Pt reports that he developed a lot of pain in left ankle yesterday and did virtual visit with MD and was started on prednisone for gout and it is doing much better. Still having some pain in right wrist.    Patient is accompained by: Family member    Pertinent History carpal tunnel, HTN, HLD, DM, gout, and former smoker.    Patient Stated Goals Increase use of RUE; walk without the walker.    Currently in Pain? Yes    Pain Score 4     Pain Location Hand   more in thumb   Pain Orientation Right    Pain Descriptors / Indicators Tightness    Pain Type Acute pain    Pain Onset In the past 7 days    Pain  Frequency Intermittent    Aggravating Factors  trying to flex thumb                             [facility] Adult PT Treatment/Exercise - 03/20/20 1502      Ambulation/Gait   Ambulation/Gait Yes    Ambulation/Gait Assistance 6: Modified independent (Device/Increase time)    Ambulation/Gait Assistance Details Pt ambulated around in clinic with RW. Pt denied any pain in left ankle with gait. PT had patient weave in and out of 4 cones and over blue mat x 4 with good control of walker. PT ambulated pt without AD CGA with pt with noted decreased step length and decreased right toe off with verbal cues to try to increase right hip flexion for more heel strike on right. Trialed SPC and pt had increased step length and stability compared to no AD.    Ambulation Distance (Feet) 115 Feet   x 3. 1 bout with RW, 1 without AD, 1 with SPC   Assistive device Rolling walker;None;Straight cane    Gait Pattern Step-through pattern;Decreased step length - right;Decreased step length - left;Decreased hip/knee flexion -  right    Ambulation Surface Level;Indoor    Stairs Yes    Stairs Assistance 5: Supervision    Stairs Assistance Details (indicate cue type and reason) Pt ambulated up with RLE and then led down with it. Reports he usually goes up with left but stopped.    Stair Management Technique Two rails;Step to pattern    Number of Stairs 4    Gait Comments BP=130/88 after session      Neuro Re-ed    Neuro Re-ed Details  Step-ups on 6" step with RLE x 10. Standing on airex at bottom of steps with feet apart without UE support x 30 sec then alternating toe taps on bottom step without UE support x 20 CGA. Pt had a couple episodes of not lifting RLE fully off step when returning down. Feet together on airex with eyes closed x 30 sec with slight increased sway. Gait along counter marching forwards and then backwards gait 8' x 6 with verbal cues to take his time and have controlled larger steps.                   PT Education - 03/20/20 1905    Education Details Started on initial HEP. Pt to use walker until can practice in therapy again next session with cane.    Person(s) Educated Patient    Methods Explanation;Demonstration;Handout    Comprehension Verbalized understanding            PT Short Term Goals - 03/17/20 1439      PT SHORT TERM GOAL #1   Title Pt will demonstrate independence with initial balance/LE strengthening HEP    Time 4    Period Weeks    Status New    Target Date 04/16/20      PT SHORT TERM GOAL #2   Title Pt will increase 30 second sit to stand to 10 repetitions without use of UE to indicate increased functional LE strength    Baseline 5.5 reps    Time 4    Period Weeks    Status New    Target Date 04/16/20      PT SHORT TERM GOAL #3   Title Pt will decrease time to perform TUG with RW to </= 17 seconds and without RW to </= 20 seconds to indicate decreased falls risk    Baseline 21 seconds with RW, 24 seconds without RW    Time 4    Period Weeks    Status New    Target Date 04/16/20      PT SHORT TERM GOAL #4   Title Pt will increase BERG score by 5 points to indicate decreased risk for falls    Baseline 32/56    Time 4    Period Weeks    Status New    Target Date 04/16/20      PT SHORT TERM GOAL #5   Title Pt will ambulate x 230' over level, indoor surfaces with L and R turns safely with supervision without RW    Time 4    Period Weeks    Status New    Target Date 04/16/20             PT Long Term Goals - 03/17/20 1444      PT LONG TERM GOAL #1   Title Pt will be independent with final HEP    Time 8    Period Weeks    Status New    Target Date  05/16/20      PT LONG TERM GOAL #2   Title Pt will report overall improvement in function on FOTO to >/= 64%    Baseline 50%    Time 8    Period Weeks    Status New    Target Date 05/16/20      PT LONG TERM GOAL #3   Title Pt will ambulate x 500' outside over paved  surfaces, negotiate curb and negotiate 8 stairs with one rail with supervision (no AD)    Time 8    Period Weeks    Status New    Target Date 05/16/20      PT LONG TERM GOAL #4   Title Pt will increase to 15 reps for 30 second sit to stand to indicate increased functional LE strength    Time 8    Period Weeks    Status New    Target Date 05/16/20      PT LONG TERM GOAL #5   Title Pt will decrease time to perform TUG without AD to </= 15 seconds    Time 8    Period Weeks    Status New    Target Date 05/16/20      Additional Long Term Goals   Additional Long Term Goals Yes      PT LONG TERM GOAL #6   Title Pt will increase BERG to >/= 42/56 to indicate decreased falls risk    Time 8    Period Weeks    Status New    Target Date 05/16/20                 Plan - 03/20/20 1906    Clinical Impression Statement Pt did well with gait with RW maneuvering around obstacles. Trialed gait without AD but pt had shorter step lengths and some instability so switched to [facility]. Pt was able to show improved step length and will benefit from continued work with cane next session.    Personal Factors and Comorbidities Comorbidity 3+;Profession    Comorbidities carpal tunnel, HTN, HLD, DM, gout, and former smoker    Examination-Activity Limitations Locomotion Level;Transfers;Stairs    Examination-Participation Restrictions Driving;Occupation;Community Activity    Stability/Clinical Decision Making Evolving/Moderate complexity    Rehab Potential Good    PT Frequency 2x / week    PT Duration 8 weeks    PT Treatment/Interventions ADLs/Self Care Home Management;Aquatic Therapy;Cryotherapy;Electrical Stimulation;Moist Heat;DME Instruction;Gait training;Stair training;Functional mobility training;Therapeutic activities;Therapeutic exercise;Balance training;Neuromuscular re-education;Patient/family education;Orthotic Fit/Training;Manual techniques;Passive range of motion    PT Next Visit Plan  Continue with gait training with SPC. Continue RLE strengthening and balance training.    Consulted and Agree with Plan of Care Patient           Patient will benefit from skilled therapeutic intervention in order to improve the following deficits and impairments:  Abnormal gait,Decreased balance,Decreased strength,Pain  Visit Diagnosis: Other abnormalities of gait and mobility  Muscle weakness (generalized)     Problem List Patient Active Problem List   Diagnosis Date Noted  . Elevated BUN   . Sleep disturbance   . Infarction of left basal ganglia ([place]) 02/21/2020  . Right hemiparesis ([place])   . Leukocytosis   . Sinus tachycardia   . Dyslipidemia   . History of gout   . Left sided lacunar infarction ([place]) 02/16/2020  . Chronic hepatitis C without hepatic coma ([place]) 01/20/2016  . Lumbar disc disease with radiculopathy 03/07/2012  . Essential hypertension 12/14/2009  .  RHINITIS 03/26/2008  . Gout 01/15/2007  . ERECTILE DYSFUNCTION 01/15/2007  . CARPAL TUNNEL SYNDROME, RIGHT 01/15/2007    Electa Sniff, PT, DPT, NCS 03/20/2020, 7:09 PM  [facility] [address] [place], [place], [address] Phone: [phone]   Fax:  [phone]  Name: Baine Decesare Sr. MRN: [id number] Date of Birth: Jul 11, 1950

## 2020-03-20 NOTE — Patient Instructions (Signed)
Cane Overhead - Supine  Hold cane at thighs with both hands, extend arms straight over head. Hold 3-5 seconds. Repeat 10-15 times. Do 2-3 times per day.  External Rotation (Eccentric), Active-Assist - Supine (Cane)  Lie on back, affected arm out from side, elbow at 90, forearm forward. Use cane to assist in lifting forearm of affected arm to neutral. Slowly lower for 3-5 seconds. 10-15 reps per set,3 sets per day, 5-7 days per week.  Copyright  VHI. All rights reserved.      Cane Horizontal - Supine   With straight arms holding cane above shoulders, bring cane out to right, center, out to left, and back to above head. Repeat 10-15 times. Do 2-3 times per day.   Supine: Chest Press (Active)    Lie on back with arms fully extended. Lower bar or dowel slowly to chest and press to arm's length. Use ___ lbs. Complete ___ sets of ___ repetitions. Perform ___ sessions per day.  Copyright  VHI. All rights reserved.

## 2020-03-20 NOTE — Therapy (Signed)
[facility] [address] [place], [place], [address] Phone: [phone]   Fax:  [phone]  Occupational Therapy Treatment  Patient Details  Name: Brent Symonette Sr. MRN: [id number] Date of Birth: 01-09-1951 Referring Provider (OT): Marlowe Shores   Encounter Date: 03/20/2020   OT End of Session - 03/20/20 1609    Visit Number 2    Number of Visits 25    Date for OT Re-Evaluation 06/16/20    Authorization Type [employer] [facility]    Progress Note Due on Visit 10    OT Start Time 1532    OT Stop Time 1615    OT Time Calculation (min) 43 min    Activity Tolerance Patient tolerated treatment well;Patient limited by pain    Behavior During Therapy [facility] for tasks assessed/performed           Past Medical History:  Diagnosis Date  . Carpal tunnel syndrome   . Gout   . Hyperlipidemia    on meds  . Hypertension    on meds    Past Surgical History:  Procedure Laterality Date  . COLONOSCOPY  2015   DJ-MAC-polyps  . LOOP RECORDER INSERTION N/A 02/19/2020   Procedure: LOOP RECORDER INSERTION;  Surgeon: Constance Haw, MD;  Location: [facility];  Service: Cardiovascular;  Laterality: N/A;  . no prior surgery    . POLYPECTOMY  2015   DJ-MAC-polyps    There were no vitals filed for this visit.   Subjective Assessment - 03/20/20 1540    Subjective  Pt reports right wrist pain and right thumb. Stiffness.    Currently in Pain? Yes    Pain Score 6     Pain Location Hand    Pain Orientation Right    Pain Descriptors / Indicators Tightness    Pain Type Acute pain    Pain Onset In the past 7 days    Pain Frequency Intermittent              [facility] OT Assessment - 03/20/20 1547      Coordination   Box and Blocks LUE 56 RUE 19                    OT Treatments/Exercises (OP) - 03/20/20 1550      Neurological Re-education Exercises   Other Exercises 1 reaching with RUE to 6" surface to place 1 inch  blocks on surface with low to mid level reach with mod verbal and visual cues for decreasing shoulder hiking. Pt had increased difficulty with connect 4 chips placing into frame.    Other Exercises 2 supine cane ex    Other Weight-Bearing Exercises 1 weight bearing into BUE hands while seated at edge of mat and forward weight shifting for targeting RUE wrist range of motion and breaking up tone and stiffness.                  OT Education - 03/20/20 1616    Education Details supine cane exercises - see pt instructions (unable to print handout)    Person(s) Educated Patient;Spouse    Methods Explanation;Demonstration    Comprehension Verbalized understanding;Returned demonstration            OT Short Term Goals - 03/20/20 1617      OT SHORT TERM GOAL #1   Title Patient will complete a home exercise program designed to improve RUE range of motion    Time 4  Period Weeks    Status On-going   issued supine cane ex   Target Date 05/02/20      OT SHORT TERM GOAL #2   Title Patient will utilize RUE to grasp release feeding utensil with min assist    Time 4    Period Weeks    Status On-going      OT SHORT TERM GOAL #3   Title Patient will tie shoes bimanually with increased time and min assist    Time 4    Period Weeks    Status New      OT SHORT TERM GOAL #4   Title Patient will zipper light weight jacket, or button 2 buttons on shirt with increased time and min assist    Time 4    Period Weeks    Status New      OT SHORT TERM GOAL #5   Title Patient will utilize BUE to wash a lightweight dish while standing at sink    Time 4    Period Weeks    Status New             OT Long Term Goals - 03/18/20 1526      OT LONG TERM GOAL #1   Title Patient will complete and updated HEP to address RUE ROM and functional strength    Time 12    Period Weeks    Status New    Target Date 06/16/20      OT LONG TERM GOAL #2   Title Patient will utilize BUE to weave chair  seating (caning) with increased time and intermittent support    Time 12    Period Weeks    Status New      OT LONG TERM GOAL #3   Title Patient will write a full sentence with RUE legibly    Time 8    Period Weeks    Status New      OT LONG TERM GOAL #4   Title Patient will reach into overhead cabinet to obtain a lightweight object (less than 3 lb) with RUE    Time 12    Period Weeks    Status New      OT LONG TERM GOAL #5   Title Patient willutilize mallet with RUE to tap to open joints of chair in furniture restoration project    Time 12    Period Weeks    Status New      Long Term Additional Goals   Additional Long Term Goals Yes      OT LONG TERM GOAL #6   Title Patient will demonstrate understanding of recommendations relating to retunring to driving    Time 12    Period Weeks    Status New                 Plan - 03/20/20 1616    Clinical Impression Statement Pt is progressing towards goals. Pt motivated for improvement.    OT Occupational Profile and History Detailed Assessment- Review of Records and additional review of physical, cognitive, psychosocial history related to current functional performance    Occupational performance deficits (Please refer to evaluation for details): ADL's;IADL's;Work    Body Structure / Function / Physical Skills ADL;Coordination;Endurance;GMC;UE functional use;Balance;Decreased knowledge of precautions;Fascial restriction;Pain;IADL;Flexibility;Decreased knowledge of use of DME;Body mechanics;Dexterity;FMC;Strength;Tone;ROM    Rehab Potential Excellent    Clinical Decision Making Several treatment options, min-mod task modification necessary    Comorbidities Affecting Occupational Performance: May have  comorbidities impacting occupational performance    Modification or Assistance to Complete Evaluation  Min-Moderate modification of tasks or assist with assess necessary to complete eval    OT Frequency 2x / week    OT Duration  12 weeks    OT Treatment/Interventions Self-care/ADL training;Electrical Stimulation;Therapeutic exercise;Aquatic Therapy;Moist Heat;Neuromuscular education;Compression bandaging;Splinting;Patient/family education;Balance training;Therapeutic activities;Functional Mobility Training;Fluidtherapy;Cryotherapy;Ultrasound;Contrast Bath;DME and/or AE instruction;Manual Therapy;Passive range of motion    Plan Gentle range of motion to right wrist, Check HEP from CIR - Would benefit from supine shoulder exercises.    Consulted and Agree with Plan of Care Patient;Family member/caregiver    Family Member Consulted Wife- Mary           Patient will benefit from skilled therapeutic intervention in order to improve the following deficits and impairments:   Body Structure / Function / Physical Skills: ADL,Coordination,Endurance,GMC,UE functional use,Balance,Decreased knowledge of precautions,Fascial restriction,Pain,IADL,Flexibility,Decreased knowledge of use of DME,Body mechanics,Dexterity,FMC,Strength,Tone,ROM       Visit Diagnosis: Hemiplegia and hemiparesis following cerebral infarction affecting right dominant side (HCC)  Muscle weakness (generalized)  Pain in right wrist  Other abnormalities of gait and mobility  Unsteadiness on feet    Problem List Patient Active Problem List   Diagnosis Date Noted  . Elevated BUN   . Sleep disturbance   . Infarction of left basal ganglia ([place]) 02/21/2020  . Right hemiparesis ([place])   . Leukocytosis   . Sinus tachycardia   . Dyslipidemia   . History of gout   . Left sided lacunar infarction ([place]) 02/16/2020  . Chronic hepatitis C without hepatic coma ([place]) 01/20/2016  . Lumbar disc disease with radiculopathy 03/07/2012  . Essential hypertension 12/14/2009  . RHINITIS 03/26/2008  . Gout 01/15/2007  . ERECTILE DYSFUNCTION 01/15/2007  . CARPAL TUNNEL SYNDROME, RIGHT 01/15/2007    Zachery Conch MOT, OTR/L  03/20/2020, 4:17 PM  [facility] [address] [place], [place], [address] Phone: [phone]   Fax:  [phone]  Name: Keilan Nichol Sr. MRN: [id number] Date of Birth: [birth year]

## 2020-03-24 ENCOUNTER — Other Ambulatory Visit: Payer: Self-pay | Admitting: *Deleted

## 2020-03-24 ENCOUNTER — Other Ambulatory Visit: Payer: Self-pay

## 2020-03-24 ENCOUNTER — Ambulatory Visit: Payer: 59

## 2020-03-24 DIAGNOSIS — I69351 Hemiplegia and hemiparesis following cerebral infarction affecting right dominant side: Secondary | ICD-10-CM | POA: Diagnosis not present

## 2020-03-24 DIAGNOSIS — R2689 Other abnormalities of gait and mobility: Secondary | ICD-10-CM

## 2020-03-24 DIAGNOSIS — R2681 Unsteadiness on feet: Secondary | ICD-10-CM | POA: Diagnosis not present

## 2020-03-24 DIAGNOSIS — M6281 Muscle weakness (generalized): Secondary | ICD-10-CM | POA: Diagnosis not present

## 2020-03-24 DIAGNOSIS — R600 Localized edema: Secondary | ICD-10-CM | POA: Diagnosis not present

## 2020-03-24 DIAGNOSIS — M25531 Pain in right wrist: Secondary | ICD-10-CM | POA: Diagnosis not present

## 2020-03-24 NOTE — Therapy (Signed)
[facility] [address] [place], [place], [address] Phone: [phone]   Fax:  [phone]  Physical Therapy Treatment  Patient Details  Name: Brent Shukla Sr. MRN: [id number] Date of Birth: 08/26/1950 Referring Provider (PT): Cathlyn Parsons, PA-C   Encounter Date: 03/24/2020   PT End of Session - 03/24/20 1453    Visit Number 3    Number of Visits 17    Date for PT Re-Evaluation 05/16/20    Authorization Type [employer]; $20 copay    PT Start Time 1447    PT Stop Time 1530    PT Time Calculation (min) 43 min    Activity Tolerance Patient tolerated treatment well    Behavior During Therapy [facility] for tasks assessed/performed           Past Medical History:  Diagnosis Date  . Carpal tunnel syndrome   . Gout   . Hyperlipidemia    on meds  . Hypertension    on meds    Past Surgical History:  Procedure Laterality Date  . COLONOSCOPY  2015   DJ-MAC-polyps  . LOOP RECORDER INSERTION N/A 02/19/2020   Procedure: LOOP RECORDER INSERTION;  Surgeon: Constance Haw, MD;  Location: [facility];  Service: Cardiovascular;  Laterality: N/A;  . no prior surgery    . POLYPECTOMY  2015   DJ-MAC-polyps    There were no vitals filed for this visit.   Subjective Assessment - 03/24/20 1450    Subjective Patient reports that the exercises went well. No issues noted. Patient reports that the ankle is feeling much better.    Patient is accompained by: Family member    Pertinent History carpal tunnel, HTN, HLD, DM, gout, and former smoker.    Patient Stated Goals Increase use of RUE; walk without the walker.    Currently in Pain? Yes    Pain Score 2     Pain Location Wrist    Pain Orientation Right    Pain Descriptors / Indicators Tightness;Aching    Pain Type Acute pain             [facility] Adult PT Treatment/Exercise - 03/24/20 0001      Ambulation/Gait   Ambulation/Gait Yes    Ambulation/Gait  Assistance 5: Supervision    Ambulation/Gait Assistance Details completed ambulation with SPC x 230 ft, patient demonstrating proper sequencing with no cues required. Progressed to completing dynamic gait activites including negotiating around obstacles (cones) and negoitaitng over obstacles x 3 laps down and back. cues for sequencing with negotiating over obstalces required.    Ambulation Distance (Feet) 230 Feet   x 1, 3 x 30'   Assistive device Straight cane    Gait Pattern Step-through pattern;Decreased step length - right;Decreased step length - left;Decreased hip/knee flexion - right    Ambulation Surface Level;Indoor      Exercises   Exercises Knee/Hip      Knee/Hip Exercises: Aerobic   Other Aerobic Completed SciFit x 5 minutes on Level 2.0 with BLE only for improved endurance/strengthening.            Balance Exercises - 03/24/20 0001      Balance Exercises: Standing   Standing Eyes Opened Narrow base of support (BOS);Head turns;Foam/compliant surface;Limitations    Standing Eyes Opened Limitations standing with eyes open and narrow BOS completed horizontal/vertical head turns x 10 reps each direction.    Standing Eyes Closed Narrow base of support (BOS);Foam/compliant surface;3 reps;30 secs;Limitations  Standing Eyes Closed Limitations completed eyes closed on airex with narrow BOS 3 x 30 seconds.    SLS with Vectors Solid surface;Limitations    SLS with Vectors Limitations completed standing on firm surface with controlled toe taps to cones completed x 10 reps forward, then progressed to completing x 10 reps crossover. increased challenge noted with crossover. intermittent CGA required.    Stepping Strategy Anterior;Posterior;Foam/compliant surface;Limitations    Stepping Strategy Limitations completed on red balance beam x 10 reps each direction anterior and posterior. Increased challenge with posterior stepping noted, requiring intermittent CGA.    Balance Beam standing on  red balance beam without UE support completed standing 2 x 1 minute without UE support.    Sidestepping Foam/compliant support;2 reps;Limitations    Sidestepping Limitations completed side stepping down and back x 2 laps on blue balance beam. verbal cues for step length with RLE.               PT Short Term Goals - 03/17/20 1439      PT SHORT TERM GOAL #1   Title Pt will demonstrate independence with initial balance/LE strengthening HEP    Time 4    Period Weeks    Status New    Target Date 04/16/20      PT SHORT TERM GOAL #2   Title Pt will increase 30 second sit to stand to 10 repetitions without use of UE to indicate increased functional LE strength    Baseline 5.5 reps    Time 4    Period Weeks    Status New    Target Date 04/16/20      PT SHORT TERM GOAL #3   Title Pt will decrease time to perform TUG with RW to </= 17 seconds and without RW to </= 20 seconds to indicate decreased falls risk    Baseline 21 seconds with RW, 24 seconds without RW    Time 4    Period Weeks    Status New    Target Date 04/16/20      PT SHORT TERM GOAL #4   Title Pt will increase BERG score by 5 points to indicate decreased risk for falls    Baseline 32/56    Time 4    Period Weeks    Status New    Target Date 04/16/20      PT SHORT TERM GOAL #5   Title Pt will ambulate x 230' over level, indoor surfaces with L and R turns safely with supervision without RW    Time 4    Period Weeks    Status New    Target Date 04/16/20             PT Long Term Goals - 03/17/20 1444      PT LONG TERM GOAL #1   Title Pt will be independent with final HEP    Time 8    Period Weeks    Status New    Target Date 05/16/20      PT LONG TERM GOAL #2   Title Pt will report overall improvement in function on FOTO to >/= 64%    Baseline 50%    Time 8    Period Weeks    Status New    Target Date 05/16/20      PT LONG TERM GOAL #3   Title Pt will ambulate x 500' outside over paved  surfaces, negotiate curb and negotiate 8 stairs with one rail with supervision (  no AD)    Time 8    Period Weeks    Status New    Target Date 05/16/20      PT LONG TERM GOAL #4   Title Pt will increase to 15 reps for 30 second sit to stand to indicate increased functional LE strength    Time 8    Period Weeks    Status New    Target Date 05/16/20      PT LONG TERM GOAL #5   Title Pt will decrease time to perform TUG without AD to </= 15 seconds    Time 8    Period Weeks    Status New    Target Date 05/16/20      Additional Long Term Goals   Additional Long Term Goals Yes      PT LONG TERM GOAL #6   Title Pt will increase BERG to >/= 42/56 to indicate decreased falls risk    Time 8    Period Weeks    Status New    Target Date 05/16/20                 Plan - 03/24/20 1712    Clinical Impression Statement Continued use of SPC with gait trianing as well as adding in negotiating over/around obstacles with patient tolerating well. Continued balance activites working toward complaint surfaces and improved step length activites. Will continue to progress toward all LTGs.    Personal Factors and Comorbidities Comorbidity 3+;Profession    Comorbidities carpal tunnel, HTN, HLD, DM, gout, and former smoker    Examination-Activity Limitations Locomotion Level;Transfers;Stairs    Examination-Participation Restrictions Driving;Occupation;Community Activity    Stability/Clinical Decision Making Evolving/Moderate complexity    Rehab Potential Good    PT Frequency 2x / week    PT Duration 8 weeks    PT Treatment/Interventions ADLs/Self Care Home Management;Aquatic Therapy;Cryotherapy;Electrical Stimulation;Moist Heat;DME Instruction;Gait training;Stair training;Functional mobility training;Therapeutic activities;Therapeutic exercise;Balance training;Neuromuscular re-education;Patient/family education;Orthotic Fit/Training;Manual techniques;Passive range of motion    PT Next Visit Plan  Continue with gait training with SPC, continue dynamic gait with AD. Continue RLE strengthening and balance training. Rockerboard activities.    Consulted and Agree with Plan of Care Patient           Patient will benefit from skilled therapeutic intervention in order to improve the following deficits and impairments:  Abnormal gait,Decreased balance,Decreased strength,Pain  Visit Diagnosis: Hemiplegia and hemiparesis following cerebral infarction affecting right dominant side (HCC)  Muscle weakness (generalized)  Other abnormalities of gait and mobility  Unsteadiness on feet     Problem List Patient Active Problem List   Diagnosis Date Noted  . Elevated BUN   . Sleep disturbance   . Infarction of left basal ganglia ([place]) 02/21/2020  . Right hemiparesis ([place])   . Leukocytosis   . Sinus tachycardia   . Dyslipidemia   . History of gout   . Left sided lacunar infarction ([place]) 02/16/2020  . Chronic hepatitis C without hepatic coma ([place]) 01/20/2016  . Lumbar disc disease with radiculopathy 03/07/2012  . Essential hypertension 12/14/2009  . RHINITIS 03/26/2008  . Gout 01/15/2007  . ERECTILE DYSFUNCTION 01/15/2007  . CARPAL TUNNEL SYNDROME, RIGHT 01/15/2007    Jones Bales, PT, DPT 03/24/2020, 5:14 PM  [facility] [address] [place], [place], [address] Phone: [phone]   Fax:  [phone]  Name: Brent Rowser Sr. MRN: [id number] Date of Birth: 1950/08/22

## 2020-03-24 NOTE — Patient Outreach (Signed)
[facility]) Care Management  03/24/2020  Brent Carreno Sr. 07/13/1950 [id number]   Transition of care follow up  /Case Closure    Referral received:02/20/20 Initial outreach:03/05/20 Insurance: Medicare A and [employer]    Subjective: Successful follow up call to patient , he reports that he is doing good, making good progress with outpatient therapy, continues to use a walker and hopeful for soon progression to cane.  No no signs symptoms of stroke, understands warning symptoms and action plan.  Patient reports tolerating diet well, good appetite. .   Objective: Brent Mccormick hospitalized at [facility] 1/30-02/21/20 for Left sided lacunar infarction , right hemiparesis,loop recorder placed, he was discharged to inpatient rehab at [facility] 2/4- 03/04/20. Comorbidities include:Dyslipidemia, gout, hypertension,He was discharged to home on 2/16/22without the need for home health services, post discharge referrals to outpatient physical and occupational Therapy, durable medical equipmentof Rolling walkerper the discharge summary   Plan Will plan case closure no new care management needs identified.   Joylene Draft, RN, BSN  [facility] Management Coordinator  [phone]- Mobile [phone]- Toll Free Main Office

## 2020-03-26 ENCOUNTER — Other Ambulatory Visit: Payer: Self-pay

## 2020-03-26 ENCOUNTER — Ambulatory Visit: Payer: 59

## 2020-03-26 DIAGNOSIS — M6281 Muscle weakness (generalized): Secondary | ICD-10-CM

## 2020-03-26 DIAGNOSIS — R2681 Unsteadiness on feet: Secondary | ICD-10-CM

## 2020-03-26 DIAGNOSIS — M25531 Pain in right wrist: Secondary | ICD-10-CM | POA: Diagnosis not present

## 2020-03-26 DIAGNOSIS — R2689 Other abnormalities of gait and mobility: Secondary | ICD-10-CM

## 2020-03-26 DIAGNOSIS — I69351 Hemiplegia and hemiparesis following cerebral infarction affecting right dominant side: Secondary | ICD-10-CM | POA: Diagnosis not present

## 2020-03-26 DIAGNOSIS — R600 Localized edema: Secondary | ICD-10-CM | POA: Diagnosis not present

## 2020-03-26 NOTE — Therapy (Signed)
[name] [address] [place], [place], [address] Phone: [phone]   Fax:  [phone]  Physical Therapy Treatment  Patient Details  Name: Brent Sulkowski Sr. MRN: [id number] Date of Birth: December 08, 1950 Referring Provider (PT): Cathlyn Parsons, PA-C   Encounter Date: 03/26/2020   PT End of Session - 03/26/20 1445    Visit Number 4    Number of Visits 17    Date for PT Re-Evaluation 05/16/20    Authorization Type [employer]; $20 copay    PT Start Time 1445    PT Stop Time 1529    PT Time Calculation (min) 44 min    Activity Tolerance Patient tolerated treatment well    Behavior During Therapy [facility] for tasks assessed/performed           Past Medical History:  Diagnosis Date  . Carpal tunnel syndrome   . Gout   . Hyperlipidemia    on meds  . Hypertension    on meds    Past Surgical History:  Procedure Laterality Date  . COLONOSCOPY  2015   DJ-MAC-polyps  . LOOP RECORDER INSERTION N/A 02/19/2020   Procedure: LOOP RECORDER INSERTION;  Surgeon: Constance Haw, MD;  Location: [place] [facility];  Service: Cardiovascular;  Laterality: N/A;  . no prior surgery    . POLYPECTOMY  2015   DJ-MAC-polyps    There were no vitals filed for this visit.   Subjective Assessment - 03/26/20 1446    Subjective Pt reports that foot and ankle doing much better. Right hand just seems very tight. Feels his hand may be going backwards some.    Patient is accompained by: Family member    Pertinent History carpal tunnel, HTN, HLD, DM, gout, and former smoker.    Patient Stated Goals Increase use of RUE; walk without the walker.    Currently in Pain? No/denies                             [facility] Adult PT Treatment/Exercise - 03/26/20 1447      Ambulation/Gait   Ambulation/Gait Yes    Ambulation/Gait Assistance 5: Supervision    Ambulation/Gait Assistance Details Pt was cued to work on increasing step  length and to relax right arm for more arm swing. At end of session pt ambulated without AD with improved step length compared to last time. Does have decreased terminal stance on right.    Ambulation Distance (Feet) 230 Feet   100' x 1 without AD   Assistive device Straight cane    Gait Pattern Step-through pattern    Ambulation Surface Level;Indoor    Stairs Yes    Stairs Assistance 4: Min guard    Stairs Assistance Details (indicate cue type and reason) Less stable with descent. Pt was cued to not move cane same time he was stepping.    Stair Management Technique Alternating pattern;With cane    Number of Stairs 8    Height of Stairs 6      Neuro Re-ed    Neuro Re-ed Details  Dynamic gait activities 115' each with cane: gait with head turns left/right or up/down on command, stop/go on command, 180 degree turns on command, marching gait CGA. BP=160/100 after. Rested a couple minutes then down to 152/96, then after a couple more minutes 140/92. Gait over obstacles: reciprocal steps over 5 hurdles then over floor ladder x 4 bouts then side stepping over  floor ladder x 2 bouts each direction. Pt was cued to try to increase speed on tasks.  In // bars: standing on airex feet together: eyes closed 30 sec x 2, alternating toe taps on cone without UE support x 10. Pt had more difficulty with tapping with RLE. SLS with 1 foot on soccerball x 30 sec each side. Noted pelvic drop in right SLS with less stability.                  PT Education - 03/26/20 1829    Education Details Added SLS at counter to HEP with instruction to try to keep pelvis level.    Person(s) Educated Patient;Spouse    Methods Explanation;Demonstration;Handout    Comprehension Verbalized understanding            PT Short Term Goals - 03/17/20 1439      PT SHORT TERM GOAL #1   Title Pt will demonstrate independence with initial balance/LE strengthening HEP    Time 4    Period Weeks    Status New    Target Date  04/16/20      PT SHORT TERM GOAL #2   Title Pt will increase 30 second sit to stand to 10 repetitions without use of UE to indicate increased functional LE strength    Baseline 5.5 reps    Time 4    Period Weeks    Status New    Target Date 04/16/20      PT SHORT TERM GOAL #3   Title Pt will decrease time to perform TUG with RW to </= 17 seconds and without RW to </= 20 seconds to indicate decreased falls risk    Baseline 21 seconds with RW, 24 seconds without RW    Time 4    Period Weeks    Status New    Target Date 04/16/20      PT SHORT TERM GOAL #4   Title Pt will increase BERG score by 5 points to indicate decreased risk for falls    Baseline 32/56    Time 4    Period Weeks    Status New    Target Date 04/16/20      PT SHORT TERM GOAL #5   Title Pt will ambulate x 230' over level, indoor surfaces with L and R turns safely with supervision without RW    Time 4    Period Weeks    Status New    Target Date 04/16/20             PT Long Term Goals - 03/17/20 1444      PT LONG TERM GOAL #1   Title Pt will be independent with final HEP    Time 8    Period Weeks    Status New    Target Date 05/16/20      PT LONG TERM GOAL #2   Title Pt will report overall improvement in function on FOTO to >/= 64%    Baseline 50%    Time 8    Period Weeks    Status New    Target Date 05/16/20      PT LONG TERM GOAL #3   Title Pt will ambulate x 500' outside over paved surfaces, negotiate curb and negotiate 8 stairs with one rail with supervision (no AD)    Time 8    Period Weeks    Status New    Target Date 05/16/20  PT LONG TERM GOAL #4   Title Pt will increase to 15 reps for 30 second sit to stand to indicate increased functional LE strength    Time 8    Period Weeks    Status New    Target Date 05/16/20      PT LONG TERM GOAL #5   Title Pt will decrease time to perform TUG without AD to </= 15 seconds    Time 8    Period Weeks    Status New    Target Date  05/16/20      Additional Long Term Goals   Additional Long Term Goals Yes      PT LONG TERM GOAL #6   Title Pt will increase BERG to >/= 42/56 to indicate decreased falls risk    Time 8    Period Weeks    Status New    Target Date 05/16/20                 Plan - 03/26/20 1830    Clinical Impression Statement Pt continued to show improvement with dynamic gait activities today with cane. Did have elevated BP after gait activities which improved some with rest. Wife going to monitor in morning and evening.    Personal Factors and Comorbidities Comorbidity 3+;Profession    Comorbidities carpal tunnel, HTN, HLD, DM, gout, and former smoker    Examination-Activity Limitations Locomotion Level;Transfers;Stairs    Examination-Participation Restrictions Driving;Occupation;Community Activity    Stability/Clinical Decision Making Evolving/Moderate complexity    Rehab Potential Good    PT Frequency 2x / week    PT Duration 8 weeks    PT Treatment/Interventions ADLs/Self Care Home Management;Aquatic Therapy;Cryotherapy;Electrical Stimulation;Moist Heat;DME Instruction;Gait training;Stair training;Functional mobility training;Therapeutic activities;Therapeutic exercise;Balance training;Neuromuscular re-education;Patient/family education;Orthotic Fit/Training;Manual techniques;Passive range of motion    PT Next Visit Plan Monitor BP. How has it been doing at home. Continue with gait training with SPC and without as able. Try gait outdoors pending weather. continue dynamic gait with AD. Continue RLE strengthening and balance training. Rockerboard activities. SLS activities.    Consulted and Agree with Plan of Care Patient           Patient will benefit from skilled therapeutic intervention in order to improve the following deficits and impairments:  Abnormal gait,Decreased balance,Decreased strength,Pain  Visit Diagnosis: Other abnormalities of gait and mobility  Muscle weakness  (generalized)  Unsteadiness on feet     Problem List Patient Active Problem List   Diagnosis Date Noted  . Elevated BUN   . Sleep disturbance   . Infarction of left basal ganglia ([place]) 02/21/2020  . Right hemiparesis (Home)   . Leukocytosis   . Sinus tachycardia   . Dyslipidemia   . History of gout   . Left sided lacunar infarction ([place]) 02/16/2020  . Chronic hepatitis C without hepatic coma ([place]) 01/20/2016  . Lumbar disc disease with radiculopathy 03/07/2012  . Essential hypertension 12/14/2009  . RHINITIS 03/26/2008  . Gout 01/15/2007  . ERECTILE DYSFUNCTION 01/15/2007  . CARPAL TUNNEL SYNDROME, RIGHT 01/15/2007    Electa Sniff, PT, DPT, NCS 03/26/2020, 6:34 PM  [facility] [address] [place], [place], [address] Phone: [phone]   Fax:  [phone]  Name: Cuyler Vandyken Sr. MRN: [id number] Date of Birth: 12/21/1950

## 2020-03-26 NOTE — Patient Instructions (Signed)
Access Code: [email or web address] URL: [email or web address] Date: 03/26/2020 Prepared by: Emily Parker  Exercises Forward Step Up - 2 x daily - 7 x weekly - 1 sets - 10 reps Walking March - 2 x daily - 7 x weekly - 1 sets - 3-4 reps Backward Walking with Counter Support - 2 x daily - 7 x weekly - 1 sets - 3-4 reps Standing Single Leg Stance with Counter Support - 1 x daily - 7 x weekly - 1 sets - 3 reps - 20-30 sec hold  

## 2020-03-27 ENCOUNTER — Other Ambulatory Visit: Payer: Self-pay | Admitting: *Deleted

## 2020-03-27 NOTE — Patient Outreach (Signed)
[facility]) Care Management  03/27/2020  Brent Deer Sr. 11/17/1950 [id number]  03/26/20 Monthly telephonic UMR case management review with UMR RNCMs and Dr Alonza Bogus. Update provided to include:  Medical Conditions Update: Mr. Brent Mccormick hospitalized at [facility] 1/30-02/21/20 for Left sided lacunar infarction , right hemiparesis,loop recorder placed, he was discharged to inpatient rehab at [facility] 2/4- 03/04/20. Comorbidities include:Dyslipidemia, gout, hypertension,He was discharged to home on 2/16/22without the need for home health services, post discharge referrals to outpatient physical and occupational Therapy, durable medical equipmentof Rolling walkerper the discharge summary   Transition of care outreach calls discussed patient progressing with outpatient physical and occupational therapy. He has attended initial post discharge visit with Rehab MD,has  Neurology appointment scheduled.     Follow up: No ongoing care coordination care needs identified , case has been closed to [facility] care management . Patient is enrolled in Chronic disease  management program through Active health management .   Joylene Draft, RN, BSN  [facility] Management Coordinator  [phone]- Mobile [phone]- Toll Free Main Office

## 2020-03-30 ENCOUNTER — Ambulatory Visit (INDEPENDENT_AMBULATORY_CARE_PROVIDER_SITE_OTHER): Payer: 59

## 2020-03-30 DIAGNOSIS — I639 Cerebral infarction, unspecified: Secondary | ICD-10-CM

## 2020-03-31 ENCOUNTER — Ambulatory Visit: Payer: 59

## 2020-03-31 ENCOUNTER — Other Ambulatory Visit: Payer: Self-pay

## 2020-03-31 ENCOUNTER — Encounter: Payer: Self-pay | Admitting: Occupational Therapy

## 2020-03-31 ENCOUNTER — Ambulatory Visit: Payer: 59 | Admitting: Occupational Therapy

## 2020-03-31 VITALS — BP 132/84

## 2020-03-31 DIAGNOSIS — M25531 Pain in right wrist: Secondary | ICD-10-CM

## 2020-03-31 DIAGNOSIS — M6281 Muscle weakness (generalized): Secondary | ICD-10-CM | POA: Diagnosis not present

## 2020-03-31 DIAGNOSIS — R600 Localized edema: Secondary | ICD-10-CM | POA: Diagnosis not present

## 2020-03-31 DIAGNOSIS — I69351 Hemiplegia and hemiparesis following cerebral infarction affecting right dominant side: Secondary | ICD-10-CM

## 2020-03-31 DIAGNOSIS — R2689 Other abnormalities of gait and mobility: Secondary | ICD-10-CM | POA: Diagnosis not present

## 2020-03-31 DIAGNOSIS — R2681 Unsteadiness on feet: Secondary | ICD-10-CM | POA: Diagnosis not present

## 2020-03-31 NOTE — Therapy (Signed)
[facility] [address] [place], [place], [address] Phone: [phone]   Fax:  [phone]  Occupational Therapy Treatment  Patient Details  Name: Brent Mandler Sr. MRN: [id number] Date of Birth: January 06, 1951 Referring Provider (OT): Mar[place]e [place]   Encounter Date: 03/31/2020   OT End of Session - 03/31/20 1805    Visit Number 3    Number of Visits 25    Date for OT Re-Evaluation 06/16/20    Authorization Type [facility]    Progress Note Due on Visit 10    OT Start Time 1700    OT Stop Time 1753    OT Time Calculation (min) 53 min    Activity Tolerance Patient tolerated treatment well    Behavior During Therapy [facility] for tasks assessed/performed           Past Medical History:  Diagnosis Date  . Carpal tunnel syndrome   . Gout   . Hyperlipidemia    on meds  . Hypertension    on meds    Past Surgical History:  Procedure Laterality Date  . COLONOSCOPY  2015   DJ-MAC-polyps  . LOOP RECORDER INSERTION N/A 02/19/2020   Procedure: LOOP RECORDER INSERTION;  Surgeon: Constance Haw, MD;  Location: [place] [facility];  Service: Cardiovascular;  Laterality: N/A;  . no prior surgery    . POLYPECTOMY  2015   DJ-MAC-polyps    There were no vitals filed for this visit.   Subjective Assessment - 03/31/20 1703    Subjective  Washing dishes, making my own toast and spreading something on it, open and close doors with right hand    Patient is accompanied by: Family member    Currently in Pain? Yes    Pain Score 3     Pain Location Other (Comment)   thumb   Pain Orientation Right    Pain Descriptors / Indicators Aching;Tightness    Pain Type Acute pain                        OT Treatments/Exercises (OP) - 03/31/20 0001      ADLs   Functional Mobility Patient has been upgraded to a single pint cane for walking.    Home Maintenance Patient reports attempting to use right hand for lots of  household tasks - washing dishes, making toast, opening doors, etc.      Neurological Re-education Exercises   Other Exercises 1 Patient with significant improvement in wrist edema and pain - no longer needing wrist brace except intermittently if pain at night.  Neuromuscular reeducation initially to address seated motion of right shoulder.  Patient with shoulder elevation and slight left lateral flexion with attempts to raise arm overhead.  Cued patient to drop shoulder and offered light active assist - and cued patient to flex elbow for overhead reach to reduce weight of long lever.    Other Exercises 2 Focused also on distal componenet of RUE functioning - taught wrist and digit extension stretch, and fine motor coordination exercises for home program.  Patient responded well to cues to reduce overactivation in trunk and shoulder, and neck.  To allow forearm or elbow to be in contact with surface to free distal arm to move in a more isolated fashion.  See patient instructions.                  OT Education - 03/31/20 1759    Education Details fine  motor coordination exercises with emphasis on distal control with reduced proxial movement/effort    Person(s) Educated Patient;Spouse    Methods Explanation;Demonstration;Tactile cues;Verbal cues;Handout    Comprehension Verbalized understanding;Returned demonstration;Verbal cues required            OT Short Term Goals - 03/31/20 1808      OT SHORT TERM GOAL #1   Title Patient will complete a home exercise program designed to improve RUE range of motion    Time 4    Period Weeks    Status On-going   issued supine cane ex   Target Date 05/02/20      OT SHORT TERM GOAL #2   Title Patient will utilize RUE to grasp release feeding utensil with min assist    Time 4    Period Weeks    Status On-going      OT SHORT TERM GOAL #3   Title Patient will tie shoes bimanually with increased time and min assist    Time 4    Period Weeks     Status New      OT SHORT TERM GOAL #4   Title Patient will zipper light weight jacket, or button 2 buttons on shirt with increased time and min assist    Time 4    Period Weeks    Status New      OT SHORT TERM GOAL #5   Title Patient will utilize BUE to wash a lightweight dish while standing at sink    Time 4    Period Weeks    Status New             OT Long Term Goals - 03/31/20 1808      OT LONG TERM GOAL #1   Title Patient will complete and updated HEP to address RUE ROM and functional strength    Time 12    Period Weeks    Status New      OT LONG TERM GOAL #2   Title Patient will utilize BUE to weave chair seating (caning) with increased time and intermittent support    Time 12    Period Weeks    Status New      OT LONG TERM GOAL #3   Title Patient will write a full sentence with RUE legibly    Time 8    Period Weeks    Status New      OT LONG TERM GOAL #4   Title Patient will reach into overhead cabinet to obtain a lightweight object (less than 3 lb) with RUE    Time 12    Period Weeks    Status New      OT LONG TERM GOAL #5   Title Patient willutilize mallet with RUE to tap to open joints of chair in furniture restoration project    Time 12    Period Weeks    Status New      OT LONG TERM GOAL #6   Title Patient will demonstrate understanding of recommendations relating to retunring to driving    Time 12    Period Weeks    Status New                 Plan - 03/31/20 1806    Clinical Impression Statement Pt is attempting to use RUE functionally at home regularly.  Pain and swelling in wrist is resolving.  Some discomfort with opposition in right thumb.    OT Occupational Profile and History  Detailed Assessment- Review of Records and additional review of physical, cognitive, psychosocial history related to current functional performance    Occupational performance deficits (Please refer to evaluation for details): ADL's;IADL's;Work    Body  Structure / Function / Physical Skills ADL;Coordination;Endurance;GMC;UE functional use;Balance;Decreased knowledge of precautions;Fascial restriction;Pain;IADL;Flexibility;Decreased knowledge of use of DME;Body mechanics;Dexterity;FMC;Strength;Tone;ROM    Rehab Potential Excellent    Clinical Decision Making Several treatment options, min-mod task modification necessary    Comorbidities Affecting Occupational Performance: May have comorbidities impacting occupational performance    Modification or Assistance to Complete Evaluation  Min-Moderate modification of tasks or assist with assess necessary to complete eval    OT Frequency 2x / week    OT Duration 12 weeks    OT Treatment/Interventions Self-care/ADL training;Electrical Stimulation;Therapeutic exercise;Aquatic Therapy;Moist Heat;Neuromuscular education;Compression bandaging;Splinting;Patient/family education;Balance training;Therapeutic activities;Functional Mobility Training;Fluidtherapy;Cryotherapy;Ultrasound;Contrast Bath;DME and/or AE instruction;Manual Therapy;Passive range of motion    Plan check HEP coordination.  May benefit from fluido then stretch to digits for improved extension. Kepp working on proximal RUE control with less trunk and neck involvement    OT Home Exercise Plan coordination HEP    Consulted and Agree with Plan of Care Patient;Family member/caregiver    Family Member Consulted Wife- Mary           Patient will benefit from skilled therapeutic intervention in order to improve the following deficits and impairments:   Body Structure / Function / Physical Skills: ADL,Coordination,Endurance,GMC,UE functional use,Balance,Decreased knowledge of precautions,Fascial restriction,Pain,IADL,Flexibility,Decreased knowledge of use of DME,Body mechanics,Dexterity,FMC,Strength,Tone,ROM       Visit Diagnosis: Hemiplegia and hemiparesis following cerebral infarction affecting right dominant side (HCC)  Pain in right  wrist  Unsteadiness on feet  Localized edema  Muscle weakness (generalized)    Problem List Patient Active Problem List   Diagnosis Date Noted  . Elevated BUN   . Sleep disturbance   . Infarction of left basal ganglia ([place]) 02/21/2020  . Right hemiparesis ([place])   . Leukocytosis   . Sinus tachycardia   . Dyslipidemia   . History of gout   . Left sided lacunar infarction ([place]) 02/16/2020  . Chronic hepatitis C without hepatic coma ([place]) 01/20/2016  . Lumbar disc disease with radiculopathy 03/07/2012  . Essential hypertension 12/14/2009  . RHINITIS 03/26/2008  . Gout 01/15/2007  . ERECTILE DYSFUNCTION 01/15/2007  . CARPAL TUNNEL SYNDROME, RIGHT 01/15/2007    Mariah Milling, OTR/L 03/31/2020, 6:09 PM  [facility] [address] [place], [place], [address] Phone: [phone]   Fax:  [phone]  Name: Brent Bosler Sr. MRN: [id number] Date of Birth: May 24, 1950

## 2020-03-31 NOTE — Patient Instructions (Signed)
  Coordination Activities  Stretch your right hand using left hand as helper.  15 seconds.    Elbows on the table.   Focus on relaxing your body and keeping shoulders down.    Perform the following activities for 10 minutes 2 times per day with right hand(s).   Flip over cards one at a time - emphasis on opening fingers  Pick up coins  Pick up and stack wooden blocks  Twirl a pen in your fingers  Work to twirl a pen and click the clicker

## 2020-03-31 NOTE — Therapy (Signed)
[facility] [address] [place], [place], [address] Phone: [phone]   Fax:  [phone]  Physical Therapy Treatment  Patient Details  Name: Brent Mcneel Sr. MRN: [id number] Date of Birth: 07/05/1950 Referring Provider (PT): Cathlyn Parsons, PA-C   Encounter Date: 03/31/2020   PT End of Session - 03/31/20 1619    Visit Number 5    Number of Visits 17    Date for PT Re-Evaluation 05/16/20    Authorization Type [employer]; $20 copay    PT Start Time 1617    PT Stop Time 1659    PT Time Calculation (min) 42 min    Activity Tolerance Patient tolerated treatment well    Behavior During Therapy [facility] for tasks assessed/performed           Past Medical History:  Diagnosis Date  . Carpal tunnel syndrome   . Gout   . Hyperlipidemia    on meds  . Hypertension    on meds    Past Surgical History:  Procedure Laterality Date  . COLONOSCOPY  2015   DJ-MAC-polyps  . LOOP RECORDER INSERTION N/A 02/19/2020   Procedure: LOOP RECORDER INSERTION;  Surgeon: Constance Haw, MD;  Location: [facility];  Service: Cardiovascular;  Laterality: N/A;  . no prior surgery    . POLYPECTOMY  2015   DJ-MAC-polyps    Vitals:   03/31/20 1620  BP: 132/84     Subjective Assessment - 03/31/20 1619    Subjective Pt reports doing well. Finished his prednisone pack. Not having any more pain.    Patient is accompained by: Family member    Pertinent History carpal tunnel, HTN, HLD, DM, gout, and former smoker.    Patient Stated Goals Increase use of RUE; walk without the walker.    Currently in Pain? No/denies                             [facility] Adult PT Treatment/Exercise - 03/31/20 1620      Ambulation/Gait   Ambulation/Gait Yes    Ambulation/Gait Assistance 5: Supervision    Ambulation/Gait Assistance Details Pt was given verbal cues to try to increase right heel strike. After returning inside  performed manual pertubation as went on x 115'. Pt was able to correct with no LOB needing assistance with this. BP=152/88 after gait.    Ambulation Distance (Feet) 1100 Feet    Assistive device Straight cane    Gait Pattern Step-through pattern;Decreased dorsiflexion - right;Decreased arm swing - right    Ambulation Surface Level;Unlevel;Indoor;Outdoor;Paved;Grass    Stairs Yes    Stairs Assistance 4: Min guard    Stairs Assistance Details (indicate cue type and reason) performed 4 steps x 2 with cane and then 4 steps x 3 without UE support.    Stair Management Technique Alternating pattern;No rails;With cane    Number of Stairs 20    Height of Stairs 6    Curb 5: Supervision    Curb Details (indicate cue type and reason) performed x 2 outside with cane. Pt leads up with RLE as feels better clearing that foot.      Neuro Re-ed    Neuro Re-ed Details  Dynamic gait activities over obstacles: reciprocal steps over 6 varied height hurdles then turning 360 degrees around 4 cones then tapping cone with each foot prior to stepping over x 4 laps. Utilized cane with activities close SBA. Standing  on rockerboard positioned ant/post trying to maintain level x 30 sec then rocking board x 10 within LOS. Maintaining board level with raising 2.2# med ball in to 90 degrees flexion x 10 with cues to engage core.                  PT Education - 03/31/20 1702    Education Details PT discussed purchasing SPC with pt and wife so he can start using around house with wife supervising. Can continue to use walker late at night if has to get up is feels less steady then.    Person(s) Educated Patient;Spouse    Methods Explanation    Comprehension Verbalized understanding            PT Short Term Goals - 03/17/20 1439      PT SHORT TERM GOAL #1   Title Pt will demonstrate independence with initial balance/LE strengthening HEP    Time 4    Period Weeks    Status New    Target Date 04/16/20      PT  SHORT TERM GOAL #2   Title Pt will increase 30 second sit to stand to 10 repetitions without use of UE to indicate increased functional LE strength    Baseline 5.5 reps    Time 4    Period Weeks    Status New    Target Date 04/16/20      PT SHORT TERM GOAL #3   Title Pt will decrease time to perform TUG with RW to </= 17 seconds and without RW to </= 20 seconds to indicate decreased falls risk    Baseline 21 seconds with RW, 24 seconds without RW    Time 4    Period Weeks    Status New    Target Date 04/16/20      PT SHORT TERM GOAL #4   Title Pt will increase BERG score by 5 points to indicate decreased risk for falls    Baseline 32/56    Time 4    Period Weeks    Status New    Target Date 04/16/20      PT SHORT TERM GOAL #5   Title Pt will ambulate x 230' over level, indoor surfaces with L and R turns safely with supervision without RW    Time 4    Period Weeks    Status New    Target Date 04/16/20             PT Long Term Goals - 03/17/20 1444      PT LONG TERM GOAL #1   Title Pt will be independent with final HEP    Time 8    Period Weeks    Status New    Target Date 05/16/20      PT LONG TERM GOAL #2   Title Pt will report overall improvement in function on FOTO to >/= 64%    Baseline 50%    Time 8    Period Weeks    Status New    Target Date 05/16/20      PT LONG TERM GOAL #3   Title Pt will ambulate x 500' outside over paved surfaces, negotiate curb and negotiate 8 stairs with one rail with supervision (no AD)    Time 8    Period Weeks    Status New    Target Date 05/16/20      PT LONG TERM GOAL #4   Title Pt will  increase to 15 reps for 30 second sit to stand to indicate increased functional LE strength    Time 8    Period Weeks    Status New    Target Date 05/16/20      PT LONG TERM GOAL #5   Title Pt will decrease time to perform TUG without AD to </= 15 seconds    Time 8    Period Weeks    Status New    Target Date 05/16/20       Additional Long Term Goals   Additional Long Term Goals Yes      PT LONG TERM GOAL #6   Title Pt will increase BERG to >/= 42/56 to indicate decreased falls risk    Time 8    Period Weeks    Status New    Target Date 05/16/20                 Plan - 03/31/20 1900    Clinical Impression Statement PT continued to progress gait with cane outside on varied surfaces today. Pt did not need any physical assist. He does have decreased right foot clearance at times. Continues to show improved balance with activities.    Personal Factors and Comorbidities Comorbidity 3+;Profession    Comorbidities carpal tunnel, HTN, HLD, DM, gout, and former smoker    Examination-Activity Limitations Locomotion Level;Transfers;Stairs    Examination-Participation Restrictions Driving;Occupation;Community Activity    Stability/Clinical Decision Making Evolving/Moderate complexity    Rehab Potential Good    PT Frequency 2x / week    PT Duration 8 weeks    PT Treatment/Interventions ADLs/Self Care Home Management;Aquatic Therapy;Cryotherapy;Electrical Stimulation;Moist Heat;DME Instruction;Gait training;Stair training;Functional mobility training;Therapeutic activities;Therapeutic exercise;Balance training;Neuromuscular re-education;Patient/family education;Orthotic Fit/Training;Manual techniques;Passive range of motion    PT Next Visit Plan I cleared him to get [facility] and use with wife at home. How is that going? Monitor BP.  Continue with gait training with SPC and without as able. continue dynamic gait with AD. Continue RLE strengthening and balance training. Rockerboard activities. SLS activities.    Consulted and Agree with Plan of Care Patient           Patient will benefit from skilled therapeutic intervention in order to improve the following deficits and impairments:  Abnormal gait,Decreased balance,Decreased strength,Pain  Visit Diagnosis: Other abnormalities of gait and mobility  Muscle weakness  (generalized)  Unsteadiness on feet     Problem List Patient Active Problem List   Diagnosis Date Noted  . Elevated BUN   . Sleep disturbance   . Infarction of left basal ganglia ([place]) 02/21/2020  . Right hemiparesis ([place])   . Leukocytosis   . Sinus tachycardia   . Dyslipidemia   . History of gout   . Left sided lacunar infarction ([place]) 02/16/2020  . Chronic hepatitis C without hepatic coma ([place]) 01/20/2016  . Lumbar disc disease with radiculopathy 03/07/2012  . Essential hypertension 12/14/2009  . RHINITIS 03/26/2008  . Gout 01/15/2007  . ERECTILE DYSFUNCTION 01/15/2007  . CARPAL TUNNEL SYNDROME, RIGHT 01/15/2007    Electa Sniff, PT, DPT, NCS 03/31/2020, 7:04 PM  [facility] [address] [place], [place], [address] Phone: [phone]   Fax:  [phone]  Name: Adger Cantera Sr. MRN: [id number] Date of Birth: May 21, 1950

## 2020-04-01 ENCOUNTER — Encounter: Payer: Self-pay | Admitting: Adult Health

## 2020-04-01 ENCOUNTER — Ambulatory Visit: Payer: 59 | Admitting: Adult Health

## 2020-04-01 VITALS — BP 140/78 | HR 68 | Ht 70.0 in | Wt 166.0 lb

## 2020-04-01 DIAGNOSIS — I639 Cerebral infarction, unspecified: Secondary | ICD-10-CM | POA: Diagnosis not present

## 2020-04-01 LAB — CUP PACEART REMOTE DEVICE CHECK
Date Time Interrogation Session: 20220316143032
Implantable Pulse Generator Implant Date: 20220202

## 2020-04-01 NOTE — Patient Instructions (Addendum)
Continue working with physical and Occupational Therapy for hopeful ongoing recovery  Continue clopidogrel 75 mg daily  and Crestor 20 mg daily for secondary stroke prevention  Loop recorder has not shown atrial fibrillation thus far - will continue to be monitored by cardiology  Continue to follow up with PCP regarding cholesterol and blood pressure management  Maintain strict control of hypertension with blood pressure goal below 130/90, diabetes with hemoglobin A1c goal below 6.5% and cholesterol with LDL cholesterol (bad cholesterol) goal below 70 mg/dL.       Followup in the future with me in 3 months or call earlier if needed       Thank you for coming to see [place] at [facility]. I hope we have been able to provide you high quality care today.  You may receive a patient satisfaction survey over the next few weeks. We would appreciate your feedback and comments so that we may continue to improve ourselves and the health of our patients.     Stroke Prevention Some medical conditions and lifestyle choices can lead to a higher risk for a stroke. You can help to prevent a stroke by eating healthy foods and exercising. It also helps to not smoke and to manage any health problems you may have. How can this condition affect me? A stroke is an emergency. It should be treated right away. A stroke can lead to brain damage or threaten your life. There is a better chance of surviving and getting better after a stroke if you get medical help right away. What can increase my risk? The following medical conditions may increase your risk of a stroke:  Diseases of the heart and blood vessels (cardiovascular disease).  High blood pressure (hypertension).  Diabetes.  High cholesterol.  Sickle cell disease.  Problems with blood clotting.  Being very overweight.  Sleeping problems (obstructivesleep apnea). Other risk factors include:  Being older than age 31.  A  history of blood clots, stroke, or mini-stroke (TIA).  Race, ethnic background, or a family history of stroke.  Smoking or using tobacco products.  Taking birth control pills, especially if you smoke.  Heavy alcohol and drug use.  Not being active. What actions can I take to prevent this? Manage your health conditions  High cholesterol. ? Eat a healthy diet. If this is not enough to manage your cholesterol, you may need to take medicines. ? Take medicines as told by your doctor.  High blood pressure. ? Try to keep your blood pressure below 130/80. ? If your blood pressure cannot be managed through a healthy diet and regular exercise, you may need to take medicines. ? Take medicines as told by your doctor. ? Ask your doctor if you should check your blood pressure at home. ? Have your blood pressure checked every year.  Diabetes. ? Eat a healthy diet and get regular exercise. If your blood sugar (glucose) cannot be managed through diet and exercise, you may need to take medicines. ? Take medicines as told by your doctor.  Talk to your doctor about getting checked for sleeping problems. Signs of a problem can include: ? Snoring a lot. ? Feeling very tired.  Make sure that you manage any other conditions you have. Nutrition  Follow instructions from your doctor about what to eat or drink. You may be told to: ? Eat and drink fewer calories each day. ? Limit how much salt (sodium) you use to 1,500 milligrams (mg) each day. ? Use  only healthy fats for cooking, such as olive oil, canola oil, and sunflower oil. ? Eat healthy foods. To do this:  Choose foods that are high in fiber. These include whole grains, and fresh fruits and vegetables.  Eat at least 5 servings of fruits and vegetables a day. Try to fill one-half of your plate with fruits and vegetables at each meal.  Choose low-fat (lean) proteins. These include low-fat cuts of meat, chicken without skin, fish, tofu, beans,  and nuts.  Eat low-fat dairy products. ? Avoid foods that:  Are high in salt.  Have saturated fat.  Have trans fat.  Have cholesterol.  Are processed or pre-made. ? Count how many carbohydrates you eat and drink each day.   Lifestyle  If you drink alcohol: ? Limit how much you have to:  0-1 drink a day for women who are not pregnant.  0-2 drinks a day for men. ? Know how much alcohol is in your drink. In the [place] one drink equals one 12 oz bottle of beer (374mL), one 5 oz glass of wine (168mL), or one 1 oz glass of hard liquor (65mL).  Do not smoke or use any products that have nicotine or tobacco. If you need help quitting, ask your doctor.  Avoid secondhand smoke.  Do not use drugs. Activity  Try to stay at a healthy weight.  Get at least 30 minutes of exercise on most days, such as: ? Fast walking. ? Biking. ? Swimming.   Medicines  Take over-the-counter and prescription medicines only as told by your doctor.  Avoid taking birth control pills. Talk to your doctor about the risks of taking birth control pills if: ? You are over 46 years old. ? You smoke. ? You get very bad headaches. ? You have had a blood clot. Where to find more information  American Stroke Association: www.strokeassociation.org Get help right away if:  You or a loved one has any signs of a stroke. "BE FAST" is an easy way to remember the warning signs: ? B - Balance. Dizziness, sudden trouble walking, or loss of balance. ? E - Eyes. Trouble seeing or a change in how you see. ? F - Face. Sudden weakness or loss of feeling of the face. The face or eyelid may droop on one side. ? A - Arms. Weakness or loss of feeling in an arm. This happens all of a sudden and most often on one side of the body. ? S - Speech. Sudden trouble speaking, slurred speech, or trouble understanding what people say. ? T - Time. Time to call emergency services. Write down what time symptoms started.  You or a  loved one has other signs of a stroke, such as: ? A sudden, very bad headache with no known cause. ? Feeling like you may vomit (nausea). ? Vomiting. ? A seizure. These symptoms may be an emergency. Get help right away. Call your local emergency services (911 in the [place]  Do not wait to see if the symptoms will go away.  Do not drive yourself to the hospital. Summary  You can help to prevent a stroke by eating healthy, exercising, and not smoking. It also helps to manage any health problems you have.  Do not smoke or use any products that contain nicotine or tobacco.  Get help right away if you or a loved one has any signs of a stroke. This information is not intended to replace advice given to you by your health  care provider. Make sure you discuss any questions you have with your health care provider. Document Revised: 08/05/2019 Document Reviewed: 08/05/2019 Elsevier Patient Education  [place].

## 2020-04-01 NOTE — Progress Notes (Signed)
[facility] [address] [place]. [place] [address] [phone]       HOSPITAL FOLLOW UP NOTE  Mr. Brent Fiola Sr. Date of Birth:  1950-06-26 Medical Record Number:  [id number]   Reason for Referral:  hospital stroke follow up    SUBJECTIVE:   CHIEF COMPLAINT:  Chief Complaint  Patient presents with  . Follow-up    TR with wife Brent Mccormick) PT is well, R sided weakness    HPI:   [name] is a 70 y.o. male with PMHx of hypertension, hyperlipidemia, diabetes, gout, former smoker quit in 1968 who presented to [facility] ED on 02/16/2020 with right arm and right leg weakness and right facial droop.  Personally reviewed hospitalization pertinent progress notes, lab work and imaging with summary provided.  Evaluated by Dr. Leonie Man with stroke work-up revealing acute ischemic infarct in the left basal ganglia s/p tPA, likely embolic given large size secondary to unknown source.  Recommended placement of loop recorder to evaluate for possible A. fib as etiology.  DAPT for 3 weeks and Plavix as on aspirin PTA.  LDL 121 rx'd Crestor PTA with noncompliance and initiated Crestor 20 mg daily.  Controlled DM with A1c 5.5.  Other stroke risk factors include HTN, former tobacco use, EtOH use, possible CHF and advanced age but no prior stroke history.   Stroke: Acute ischemic infarct in left basal ganglia, likely due cryptogenic given large size of the infarct  code Stroke CT head No acute abnormality.ASPECTS 10.   CTA head & neck :  Negative for intracranial large vessel occlusion  MRI : Acute infarction in the left basal ganglia and radiating white matter tracts.  2D Echo : 60-65% There is severe left ventricular hypertrophy of the basal-septal segment. Left ventricular diastolic parameters are consistent with Grade I diastolic dysfunction (impaired relaxation).  Loop recorder placed 02/19/2020  LDL 121  HgbA1c 5.5  aspirin 81 mg daily prior to admission, For now due  to post tPA. Recommend aspirin 81 mg and Plavix 75 mg for 3 weeks and then continue Plavix alone.  Therapy recommendations: CIR  Disposition: CIR on 02/21/2020  Today, 04/01/2020, Brent Mccormick is being seen for hospital follow-up accompanied by his wife He was discharged home from [facility] on 03/04/2020 after a 12 day stay  Reports residual mild right arm and leg weakness with greater weakness right hand and gait impairment with imbalance Single point cane now - prior use of RW - denies any recent falls Currently working with neuro rehab PT/OT - reports making great improvement Previously working for himself doing Estate manager/land agent - he has not returned back to doing this yet Denies new or worsening stroke/TIA symptoms  Completed 3 weeks DAPT remains on Plavix -denies side effects Reports compliance on Crestor 20 mg daily -denies side effects Blood pressure today 140/78 - monitors at home 130-140s  Loop recorder has not shown atrial fibrillation thus far  No further concerns at this time    ROS:   14 system review of systems performed and negative with exception of those listed in HPI  PMH:  Past Medical History:  Diagnosis Date  . Carpal tunnel syndrome   . Gout   . Hyperlipidemia    on meds  . Hypertension    on meds    PSH:  Past Surgical History:  Procedure Laterality Date  . COLONOSCOPY  2015   DJ-MAC-polyps  . LOOP RECORDER INSERTION N/A 02/19/2020   Procedure: LOOP RECORDER INSERTION;  Surgeon: Constance Haw, MD;  Location: [facility];  Service: Cardiovascular;  Laterality: N/A;  . no prior surgery    . POLYPECTOMY  2015   DJ-MAC-polyps    Social History:  Social History   Socioeconomic History  . Marital status: Married    Spouse name: Not on file  . Number of children: Not on file  . Years of education: Not on file  . Highest education level: Not on file  Occupational History  . Not on file  Tobacco Use  . Smoking status: Former  Smoker    Quit date: 01/17/1966    Years since quitting: 54.2  . Smokeless tobacco: Never Used  Vaping Use  . Vaping Use: Never used  Substance and Sexual Activity  . Alcohol use: Yes    Comment: occassionally  . Drug use: No  . Sexual activity: Yes  Other Topics Concern  . 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    Married    Two children, live locally.       Three dogs and two cats    Social Determinants of Health   Financial Resource Strain: Not on file  Food Insecurity: Not on file  Transportation Needs: Not on file  Physical Activity: Not on file  Stress: Not on file  Social Connections: Not on file  Intimate Partner Violence: Not on file    Family History:  Family History  Problem Relation Age of Onset  . Ovarian cancer Mother   . Heart disease Father   . Hypertension Father   . Colon polyps Sister 45  . Colon cancer Neg Hx   . Esophageal cancer Neg Hx   . Rectal cancer Neg Hx   . Stomach cancer Neg Hx     Medications:   Current Outpatient Medications on File Prior to Visit  Medication Sig Dispense Refill  . acetaminophen (TYLENOL) 325 MG tablet Take 2 tablets (650 mg total) by mouth every 4 (four) hours as needed for mild pain (or temp > 37.5 C (99.5 F)).    . cholecalciferol (VITAMIN D3) 25 MCG (1000 UNIT) tablet Take 1 tablet (1,000 Units total) by mouth daily. 30 tablet 0  . clopidogrel (PLAVIX) 75 MG tablet Take 1 tablet (75 mg total) by mouth daily. 30 tablet 1  . colchicine 0.6 MG tablet Take 1 tablet (0.6 mg total) by mouth every other day. 45 tablet 3  . cyclobenzaprine (FLEXERIL) 10 MG tablet Take 1 tablet (10 mg total) by mouth 3 (three) times daily as needed for muscle spasms. 90 tablet 0  . losartan (COZAAR) 100 MG tablet Take 1 tablet (100 mg total) by mouth daily. 90 tablet 3  . melatonin 3 MG TABS tablet Take 1 tablet (3 mg total) by mouth at bedtime. 30  tablet 0  . pantoprazole (PROTONIX) 40 MG tablet Take 1 tablet (40 mg total) by mouth at bedtime. 30 tablet 0  . rosuvastatin (CRESTOR) 20 MG tablet Take 1 tablet (20 mg total) by mouth daily. 30 tablet 0   No current facility-administered medications on file prior to visit.    Allergies:  No Known Allergies    OBJECTIVE:  Physical Exam  Vitals:   04/01/20 0958  BP: 140/78  Pulse: 68  Weight: 166 lb (75.3 kg)  Height: _0  (1.778 m)   Body mass index is 23.82 kg/m. No exam data  present   Post stroke PHQ 2/9 Depression screen [facility] 2/9 03/16/2020  Decreased Interest 0  Down, Depressed, Hopeless 0  PHQ - 2 Score 0  Altered sleeping 1  Tired, decreased energy 0  Change in appetite 0  Feeling bad or failure about yourself  0  Trouble concentrating 0  Moving slowly or fidgety/restless 0  Suicidal thoughts 0  PHQ-9 Score 1     General: well developed, well nourished,  very pleasant elderly Caucasian male, seated, in no evident distress Head: head normocephalic and atraumatic.   Neck: supple with no carotid or supraclavicular bruits Cardiovascular: regular rate and rhythm, no murmurs Musculoskeletal: no deformity Skin:  no rash/petichiae Vascular:  Normal pulses all extremities   Neurologic Exam Mental Status: Awake and fully alert.   Fluent speech and language.  Oriented to place and time. Recent and remote memory intact. Attention span, concentration and fund of knowledge appropriate. Mood and affect appropriate.  Cranial Nerves: Fundoscopic exam reveals sharp disc margins. Pupils equal, briskly reactive to light. Extraocular movements full without nystagmus. Visual fields full to confrontation. Hearing intact. Facial sensation intact. Face, tongue, palate moves normally and symmetrically.  Motor: Normal strength, bulk and tone left upper and lower extremity. RUE: 4+/5 proximal and 3/5 hand with increased tone RLE: 4+-5/5 ankle dorsiflexion otherwise 5-/5 Sensory.:  intact to touch , pinprick , position and vibratory sensation.  Coordination: Rapid alternating movements normal in all extremities except right hand. Finger-to-nose performed accurately LUE and heel-to-shin performed accurately bilaterally. Gait and Station: Arises from chair without difficulty. Stance is normal. Gait demonstrates normal stride length with decreased right step height and mild imbalance greater with turns and use of single-point cane.  Tandem walk and heel toe not attempted Reflexes: 1+ and symmetric. Toes downgoing.     NIHSS  0 Modified Rankin  2      ASSESSMENT: Brent Pease. is a 70 y.o. year old male presented with right arm and leg weakness and right facial droop on 02/16/2020 with stroke work-up revealing acute ischemic infarct in left basal ganglia s/p tPA likely embolic given large size secondary to unknown source s/p ILR. Vascular risk factors include HTN, HLD with statin noncompliance, DM, former tobacco use, EtOH use and possible CHF.      PLAN:  1. L BG stroke :  a. Residual deficit: Right hand weakness and mild right hemiparesis and gait impairment with imbalance.  Continue participation with neuro rehab PT/OT for hopeful ongoing recovery.  Advised use of cane at all times unless otherwise instructed by therapy b. Loop recorder has not shown atrial fibrillation thus far.   c. Continue clopidogrel 75 mg daily  and Crestor 20 mg daily for secondary stroke prevention.   d. Discussed secondary stroke prevention measures and importance of close PCP follow up for aggressive stroke risk factor management  e. [facility] research team provided information regarding Brent Mccormick trial 2. HTN: BP goal <130/90.  Stable on losartan per PCP 3. HLD: LDL goal <70. Recent LDL 121 -initiated Crestor 20 mg daily.  Request follow-up with PCP in the next 1 to 2 months for repeat lipid panel and ongoing prescribing of statin 4. DMII: A1c goal<7.0. Recent A1c 5.5.  Well-controlled on  nonpharmacological management per PCP    Follow up in 3 months or call earlier if needed   CC:  [place] provider: Dr. Leonie Man PCP: Dorothyann Peng, NP    I spent 45 minutes of face-to-face and non-face-to-face time with patient and wife.  This included previsit chart review including recent hospitalization pertinent progress notes, lab work and imaging, lab review, study review, order entry, electronic health record documentation, patient education regarding recent stroke and possible underlying etiologies, ongoing monitoring of loop recorder, residual deficits, importance of managing stroke risk factors and answered all other questions to patient and wife's satisfaction  Frann Rider, AGNP-BC  [facility] [address] [place], [place] [address]  Phone [phone] Fax [phone] Note: This document was prepared with digital dictation and possible smart phrase technology. Any transcriptional errors that result from this process are unintentional.

## 2020-04-02 ENCOUNTER — Telehealth: Payer: Self-pay | Admitting: Neurology

## 2020-04-02 ENCOUNTER — Other Ambulatory Visit: Payer: Self-pay

## 2020-04-02 ENCOUNTER — Other Ambulatory Visit: Payer: Self-pay | Admitting: Adult Health

## 2020-04-02 ENCOUNTER — Ambulatory Visit: Payer: 59

## 2020-04-02 VITALS — BP 158/95 | HR 90

## 2020-04-02 DIAGNOSIS — M25531 Pain in right wrist: Secondary | ICD-10-CM | POA: Diagnosis not present

## 2020-04-02 DIAGNOSIS — R2689 Other abnormalities of gait and mobility: Secondary | ICD-10-CM | POA: Diagnosis not present

## 2020-04-02 DIAGNOSIS — R2681 Unsteadiness on feet: Secondary | ICD-10-CM

## 2020-04-02 DIAGNOSIS — M6281 Muscle weakness (generalized): Secondary | ICD-10-CM | POA: Diagnosis not present

## 2020-04-02 DIAGNOSIS — R600 Localized edema: Secondary | ICD-10-CM | POA: Diagnosis not present

## 2020-04-02 DIAGNOSIS — I69351 Hemiplegia and hemiparesis following cerebral infarction affecting right dominant side: Secondary | ICD-10-CM

## 2020-04-02 NOTE — Telephone Encounter (Signed)
Patient's wife stopped by the office today stating they need a letter send to [employer] stating the dates that he was hospitalized in Neuro ICU from 02-16-2020  thru 02-19-2020  Patient: Brent Mccormick DOB 04-17-1950  [employer]  FAX: [phone]  Claim # [id number]  Please call wife at [phone] when letter is faxed to [employer] of if you need extra information.

## 2020-04-02 NOTE — Therapy (Signed)
[facility] [address] [place], [place], [address] Phone: [phone]   Fax:  [phone]  Physical Therapy Treatment  Patient Details  Name: Brent Eid Sr. MRN: [id number] Date of Birth: Sep 29, 1950 Referring Provider (PT): Cathlyn Parsons, PA-C   Encounter Date: 04/02/2020   PT End of Session - 04/02/20 1501    Visit Number 6    Number of Visits 17    Date for PT Re-Evaluation 05/16/20    Authorization Type [employer]; $20 copay    PT Start Time 1401    PT Stop Time 1442    PT Time Calculation (min) 41 min    Equipment Utilized During Treatment Gait belt    Activity Tolerance Patient tolerated treatment well;Treatment limited secondary to medical complications (Comment)   elevated BP with activity.   Behavior During Therapy [facility] for tasks assessed/performed           Past Medical History:  Diagnosis Date  . Carpal tunnel syndrome   . Gout   . Hyperlipidemia    on meds  . Hypertension    on meds    Past Surgical History:  Procedure Laterality Date  . COLONOSCOPY  2015   DJ-MAC-polyps  . LOOP RECORDER INSERTION N/A 02/19/2020   Procedure: LOOP RECORDER INSERTION;  Surgeon: Constance Haw, MD;  Location: [facility];  Service: Cardiovascular;  Laterality: N/A;  . no prior surgery    . POLYPECTOMY  2015   DJ-MAC-polyps    Vitals:   04/02/20 1416 04/02/20 1421 04/02/20 1440  BP: (!) 153/102 (!) 148/89 (!) 158/95  Pulse: 86 83 90     Subjective Assessment - 04/02/20 1403    Subjective Pt denied falls or changes since last visit. Pt is using SPC at home and at neuro appt. yesterday.    Pertinent History carpal tunnel, HTN, HLD, DM, gout, and former smoker.    Patient Stated Goals Increase use of RUE; walk without the walker.    Currently in Pain? No/denies                             [facility] Adult PT Treatment/Exercise - 04/02/20 1418      Ambulation/Gait    Ambulation/Gait Yes    Ambulation/Gait Assistance 5: Supervision;4: Min guard    Ambulation/Gait Assistance Details Min guard over grassy terrain to ensure safety. Pt performed vertical/horizontal head turns and various speeds.    Ambulation Distance (Feet) 1000 Feet    Assistive device Straight cane    Gait Pattern Step-through pattern;Decreased dorsiflexion - right;Decreased arm swing - right    Ambulation Surface Level;Unlevel;Indoor;Outdoor;Paved;Grass      High Level Balance   High Level Balance Activities Backward walking;Head turns;Other (comment)   forward with head turns/nods   High Level Balance Comments Performed 4 reps/activity. cues and demo for technique. Incr. sway noted with head turns and cues to improve step length during retro gait.               Balance Exercises - 04/02/20 1458      Balance Exercises: Standing   Rockerboard Anterior/posterior;EO;EC;10 seconds;30 seconds;Other reps (comment)   30 sec. hold EO and 3x10 sec. hold EC   Other Standing Exercises In // bars: On rockerboard: min guard to min A with EC 2/2 post. lean and cues for technique. Pt also performed ball toss/catch in all directions with min guard from PT and PT tech  tossing ball to pt x 50 reps. External pertubations provided to pt by PT in all directions while pt standing on rockerboard and foam beam to improve reaction time and stepping strategy x20 reps.             PT Education - 04/02/20 1500    Education Details PT discussed the importance of monitoring BP at home and informing PCP re: elevated BP during today's session. PT will also send note to PCP.    Person(s) Educated Patient;Spouse    Methods Explanation    Comprehension Verbalized understanding;Returned demonstration            PT Short Term Goals - 03/17/20 1439      PT SHORT TERM GOAL #1   Title Pt will demonstrate independence with initial balance/LE strengthening HEP    Time 4    Period Weeks    Status New     Target Date 04/16/20      PT SHORT TERM GOAL #2   Title Pt will increase 30 second sit to stand to 10 repetitions without use of UE to indicate increased functional LE strength    Baseline 5.5 reps    Time 4    Period Weeks    Status New    Target Date 04/16/20      PT SHORT TERM GOAL #3   Title Pt will decrease time to perform TUG with RW to </= 17 seconds and without RW to </= 20 seconds to indicate decreased falls risk    Baseline 21 seconds with RW, 24 seconds without RW    Time 4    Period Weeks    Status New    Target Date 04/16/20      PT SHORT TERM GOAL #4   Title Pt will increase BERG score by 5 points to indicate decreased risk for falls    Baseline 32/56    Time 4    Period Weeks    Status New    Target Date 04/16/20      PT SHORT TERM GOAL #5   Title Pt will ambulate x 230' over level, indoor surfaces with L and R turns safely with supervision without RW    Time 4    Period Weeks    Status New    Target Date 04/16/20             PT Long Term Goals - 03/17/20 1444      PT LONG TERM GOAL #1   Title Pt will be independent with final HEP    Time 8    Period Weeks    Status New    Target Date 05/16/20      PT LONG TERM GOAL #2   Title Pt will report overall improvement in function on FOTO to >/= 64%    Baseline 50%    Time 8    Period Weeks    Status New    Target Date 05/16/20      PT LONG TERM GOAL #3   Title Pt will ambulate x 500' outside over paved surfaces, negotiate curb and negotiate 8 stairs with one rail with supervision (no AD)    Time 8    Period Weeks    Status New    Target Date 05/16/20      PT LONG TERM GOAL #4   Title Pt will increase to 15 reps for 30 second sit to stand to indicate increased functional LE strength  Time 8    Period Weeks    Status New    Target Date 05/16/20      PT LONG TERM GOAL #5   Title Pt will decrease time to perform TUG without AD to </= 15 seconds    Time 8    Period Weeks    Status New     Target Date 05/16/20      Additional Long Term Goals   Additional Long Term Goals Yes      PT LONG TERM GOAL #6   Title Pt will increase BERG to >/= 42/56 to indicate decreased falls risk    Time 8    Period Weeks    Status New    Target Date 05/16/20                 Plan - 04/02/20 1502    Clinical Impression Statement Pt demonstrated progress as he's able to amb. over outdoor, uneven terrain while performing dynamic gait activities. Pt noted to experience decr. R foot clearance towards last 50' of amb. Pt required min guard to min A during high level balance activities today, especially with eyes closed. Pt's BP was elevated during session, after activity. PT will route note to PCP and neurologist. Pt would continue to benefit from skilled PT to improve safety during functional mobility.    Personal Factors and Comorbidities Comorbidity 3+;Profession    Comorbidities carpal tunnel, HTN, HLD, DM, gout, and former smoker    Examination-Activity Limitations Locomotion Level;Transfers;Stairs    Examination-Participation Restrictions Driving;Occupation;Community Activity    Stability/Clinical Decision Making Evolving/Moderate complexity    Rehab Potential Good    PT Frequency 2x / week    PT Duration 8 weeks    PT Treatment/Interventions ADLs/Self Care Home Management;Aquatic Therapy;Cryotherapy;Electrical Stimulation;Moist Heat;DME Instruction;Gait training;Stair training;Functional mobility training;Therapeutic activities;Therapeutic exercise;Balance training;Neuromuscular re-education;Patient/family education;Orthotic Fit/Training;Manual techniques;Passive range of motion    PT Next Visit Plan Monitor BP.  Continue with gait training with SPC and without as able. continue dynamic gait with AD. Continue RLE strengthening and balance training. Rockerboard activities. SLS activities.    Consulted and Agree with Plan of Care Patient;Family member/caregiver    Family Member Consulted  wife           Patient will benefit from skilled therapeutic intervention in order to improve the following deficits and impairments:  Abnormal gait,Decreased balance,Decreased strength,Pain  Visit Diagnosis: Hemiplegia and hemiparesis following cerebral infarction affecting right dominant side (HCC)  Unsteadiness on feet  Other abnormalities of gait and mobility  Muscle weakness (generalized)     Problem List Patient Active Problem List   Diagnosis Date Noted  . Elevated BUN   . Sleep disturbance   . Infarction of left basal ganglia ([place]) 02/21/2020  . Right hemiparesis ([place])   . Leukocytosis   . Sinus tachycardia   . Dyslipidemia   . History of gout   . Left sided lacunar infarction ([place]) 02/16/2020  . Chronic hepatitis C without hepatic coma ([place]) 01/20/2016  . Lumbar disc disease with radiculopathy 03/07/2012  . Essential hypertension 12/14/2009  . RHINITIS 03/26/2008  . Gout 01/15/2007  . ERECTILE DYSFUNCTION 01/15/2007  . CARPAL TUNNEL SYNDROME, RIGHT 01/15/2007    Albina Gosney L 04/02/2020, 3:05 PM  [facility] [address] [place], [place], [address] Phone: [phone]   Fax:  [phone]  Name: Marsha Hillman Sr. MRN: [id number] Date of Birth: September 07, 1950  Geoffry Paradise, PT,DPT 04/02/20 3:06 PM Phone: [phone] Fax: [phone]

## 2020-04-02 NOTE — Telephone Encounter (Signed)
Wife spoke of this at yesterday's visit but states it was usually elevated shortly after doing exercises with therapy. They would monitor at home and SBP typically 130s-140s. I personally was okay with these levels but advised to follow up with [facility] as well to verify. Thank you for letting me know!

## 2020-04-02 NOTE — Telephone Encounter (Signed)
Called and left message regarding hospitalization records.  Advised to contact hospital to get a copy of those records.   Left call back number to call if any additional questions.

## 2020-04-05 NOTE — Progress Notes (Signed)
I agree with the above plan 

## 2020-04-07 ENCOUNTER — Other Ambulatory Visit: Payer: Self-pay | Admitting: Adult Health

## 2020-04-07 ENCOUNTER — Ambulatory Visit: Payer: 59

## 2020-04-07 ENCOUNTER — Other Ambulatory Visit: Payer: Self-pay

## 2020-04-07 ENCOUNTER — Ambulatory Visit: Payer: 59 | Admitting: Occupational Therapy

## 2020-04-07 ENCOUNTER — Telehealth: Payer: Self-pay

## 2020-04-07 VITALS — BP 140/86

## 2020-04-07 DIAGNOSIS — R2689 Other abnormalities of gait and mobility: Secondary | ICD-10-CM

## 2020-04-07 DIAGNOSIS — R2681 Unsteadiness on feet: Secondary | ICD-10-CM

## 2020-04-07 DIAGNOSIS — M6281 Muscle weakness (generalized): Secondary | ICD-10-CM

## 2020-04-07 DIAGNOSIS — I69351 Hemiplegia and hemiparesis following cerebral infarction affecting right dominant side: Secondary | ICD-10-CM | POA: Diagnosis not present

## 2020-04-07 DIAGNOSIS — M25531 Pain in right wrist: Secondary | ICD-10-CM | POA: Diagnosis not present

## 2020-04-07 DIAGNOSIS — R600 Localized edema: Secondary | ICD-10-CM | POA: Diagnosis not present

## 2020-04-07 MED FILL — ROSUVASTATIN CALCIUM 20 MG: 20 | 90 days supply | Qty: 90 | Fill #0

## 2020-04-07 NOTE — Therapy (Signed)
[facility] [address] [place], [place], [address] Phone: [phone]   Fax:  [phone]  Physical Therapy Treatment  Patient Details  Name: Brent Graffam Sr. MRN: [id number] Date of Birth: [birth year] Referring Provider (PT): Cathlyn Parsons, PA-C   Encounter Date: 04/07/2020   PT End of Session - 04/07/20 1456    Visit Number 7    Number of Visits 17    Date for PT Re-Evaluation 05/16/20    Authorization Type [employer]; $20 copay    PT Start Time 1450    PT Stop Time 1530    PT Time Calculation (min) 40 min    Equipment Utilized During Treatment Gait belt    Activity Tolerance Patient tolerated treatment well;Treatment limited secondary to medical complications (Comment)   elevated BP with activity.   Behavior During Therapy [facility] for tasks assessed/performed           Past Medical History:  Diagnosis Date  . Carpal tunnel syndrome   . Gout   . Hyperlipidemia    on meds  . Hypertension    on meds    Past Surgical History:  Procedure Laterality Date  . COLONOSCOPY  2015   DJ-MAC-polyps  . LOOP RECORDER INSERTION N/A 02/19/2020   Procedure: LOOP RECORDER INSERTION;  Surgeon: Constance Haw, MD;  Location: [facility];  Service: Cardiovascular;  Laterality: N/A;  . no prior surgery    . POLYPECTOMY  2015   DJ-MAC-polyps    Vitals:   04/07/20 1456  BP: 140/86     Subjective Assessment - 04/07/20 1456    Subjective Pt reports that his left outer foot is sore since last night especially when walks on it. Did do a little more walking yesterday. PT looked at foot and no signs of swelling or redness. Not tender to palpation. Left 2nd toe that had gout is still red. Has appointment with Dr. [name] tomorrow about BP. Was 154/94 prior to coming.    Pertinent History carpal tunnel, HTN, HLD, DM, gout, and former smoker.    Patient Stated Goals Increase use of RUE; walk without the walker.     Currently in Pain? Yes    Pain Score 2    4/10 when up on foot   Pain Location Foot    Pain Orientation Lateral    Pain Descriptors / Indicators Sore    Pain Type Acute pain    Pain Onset Yesterday    Pain Frequency Intermittent    Aggravating Factors  weight on foot                             [facility] Adult PT Treatment/Exercise - 04/07/20 1519      Ambulation/Gait   Ambulation/Gait Yes    Ambulation/Gait Assistance 5: Supervision    Ambulation/Gait Assistance Details Verbal and tactile cues to relax right arm to try to get some arm swing. Pt did have decreased stance time on left at toe off due to pain in foot. BP=142/88 after gait at end of session.    Ambulation Distance (Feet) 345 Feet    Assistive device Straight cane    Gait Pattern Step-through pattern;Decreased stance time - left;Antalgic    Ambulation Surface Level;Indoor      Neuro Re-ed    Neuro Re-ed Details  At bottom of steps: Standing on airex tapping feet together x 30 sec eyes open then 30 sec eyes  closed, then head turns left/right x 10, tapping 2nd step x 10 then 3rd step x 10 with LLE to increase right SLS time. Standing on rockerboard positioned ant/post tapping 2nd step with LLE x 10. Standing on rockerboard  postioned ant/post playing catch off trampoline with 1.1# ball x 20 tosses with left hand and then repeated with board turned lateral. Pt reported it was more challenging with board lateral. Repeated the tossing with SLS on airex on RLE with LLE on soccerball  20 tosses x 2. Pt was cued to stand tall throughout.                  PT Education - 04/07/20 1541    Education Details PT let pt and wife know she would send recent BP readings to MD for tomorrow so he could have a quick reference.    Person(s) Educated Patient;Spouse    Methods Explanation    Comprehension Verbalized understanding            PT Short Term Goals - 03/17/20 1439      PT SHORT TERM GOAL #1   Title Pt  will demonstrate independence with initial balance/LE strengthening HEP    Time 4    Period Weeks    Status New    Target Date 04/16/20      PT SHORT TERM GOAL #2   Title Pt will increase 30 second sit to stand to 10 repetitions without use of UE to indicate increased functional LE strength    Baseline 5.5 reps    Time 4    Period Weeks    Status New    Target Date 04/16/20      PT SHORT TERM GOAL #3   Title Pt will decrease time to perform TUG with RW to </= 17 seconds and without RW to </= 20 seconds to indicate decreased falls risk    Baseline 21 seconds with RW, 24 seconds without RW    Time 4    Period Weeks    Status New    Target Date 04/16/20      PT SHORT TERM GOAL #4   Title Pt will increase BERG score by 5 points to indicate decreased risk for falls    Baseline 32/56    Time 4    Period Weeks    Status New    Target Date 04/16/20      PT SHORT TERM GOAL #5   Title Pt will ambulate x 230' over level, indoor surfaces with L and R turns safely with supervision without RW    Time 4    Period Weeks    Status New    Target Date 04/16/20             PT Long Term Goals - 03/17/20 1444      PT LONG TERM GOAL #1   Title Pt will be independent with final HEP    Time 8    Period Weeks    Status New    Target Date 05/16/20      PT LONG TERM GOAL #2   Title Pt will report overall improvement in function on FOTO to >/= 64%    Baseline 50%    Time 8    Period Weeks    Status New    Target Date 05/16/20      PT LONG TERM GOAL #3   Title Pt will ambulate x 500' outside over paved surfaces, negotiate curb and negotiate  8 stairs with one rail with supervision (no AD)    Time 8    Period Weeks    Status New    Target Date 05/16/20      PT LONG TERM GOAL #4   Title Pt will increase to 15 reps for 30 second sit to stand to indicate increased functional LE strength    Time 8    Period Weeks    Status New    Target Date 05/16/20      PT LONG TERM GOAL #5    Title Pt will decrease time to perform TUG without AD to </= 15 seconds    Time 8    Period Weeks    Status New    Target Date 05/16/20      Additional Long Term Goals   Additional Long Term Goals Yes      PT LONG TERM GOAL #6   Title Pt will increase BERG to >/= 42/56 to indicate decreased falls risk    Time 8    Period Weeks    Status New    Target Date 05/16/20                 Plan - 04/07/20 1544    Clinical Impression Statement PT limited walking and activities that would increase WB on LLE due to new onset of left lateral foot pain. No tenderness to palpation or swelling noted but 2nd toe still red from recent gout flare. Pt's right wrist/hand also bothering him more and appeared more swollen and red. Pt had finished prednisone last week and PT wondering if gout could be acting up again. Will notify MD if note about BPs as well since he sees him tomorrow. Pt was showing improved stability in right SLS today.    Personal Factors and Comorbidities Comorbidity 3+;Profession    Comorbidities carpal tunnel, HTN, HLD, DM, gout, and former smoker    Examination-Activity Limitations Locomotion Level;Transfers;Stairs    Examination-Participation Restrictions Driving;Occupation;Community Activity    Stability/Clinical Decision Making Evolving/Moderate complexity    Rehab Potential Good    PT Frequency 2x / week    PT Duration 8 weeks    PT Treatment/Interventions ADLs/Self Care Home Management;Aquatic Therapy;Cryotherapy;Electrical Stimulation;Moist Heat;DME Instruction;Gait training;Stair training;Functional mobility training;Therapeutic activities;Therapeutic exercise;Balance training;Neuromuscular re-education;Patient/family education;Orthotic Fit/Training;Manual techniques;Passive range of motion    PT Next Visit Plan How did PCP visit go? Is he still having left lateral foot pain. If not progress gait with SPC and try without. Monitor BP.  continue dynamic gait activities.  Continue RLE strengthening and balance training. Rockerboard activities. SLS activities.    Consulted and Agree with Plan of Care Patient;Family member/caregiver    Family Member Consulted wife           Patient will benefit from skilled therapeutic intervention in order to improve the following deficits and impairments:  Abnormal gait,Decreased balance,Decreased strength,Pain  Visit Diagnosis: Other abnormalities of gait and mobility  Muscle weakness (generalized)     Problem List Patient Active Problem List   Diagnosis Date Noted  . Elevated BUN   . Sleep disturbance   . Infarction of left basal ganglia ([place]) 02/21/2020  . Right hemiparesis ([place])   . Leukocytosis   . Sinus tachycardia   . Dyslipidemia   . History of gout   . Left sided lacunar infarction ([place]) 02/16/2020  . Chronic hepatitis C without hepatic coma ([place]) 01/20/2016  . Lumbar disc disease with radiculopathy 03/07/2012  . Essential hypertension 12/14/2009  .  RHINITIS 03/26/2008  . Gout 01/15/2007  . ERECTILE DYSFUNCTION 01/15/2007  . CARPAL TUNNEL SYNDROME, RIGHT 01/15/2007    Electa Sniff, PT, DPT, NCS 04/07/2020, 3:47 PM  [facility] [address] [place], [place], [address] Phone: [phone]   Fax:  [phone]  Name: Crixus Mcaulay Sr. MRN: [id number] Date of Birth: [birth year]

## 2020-04-07 NOTE — Progress Notes (Signed)
[facility] Summary Report / Loop Recorder 

## 2020-04-07 NOTE — Therapy (Signed)
[facility] [address] [place], [place], [address] Phone: [phone]   Fax:  [phone]  Occupational Therapy Treatment  Patient Details  Name: Brent Seehafer Sr. MRN: [id number] Date of Birth: 02-09-1950 Referring Provider (OT): Mar[place]e [place]   Encounter Date: 04/07/2020   OT End of Session - 04/07/20 1539    Visit Number 4    Number of Visits 25    Date for OT Re-Evaluation 06/16/20    Authorization Type [facility] [facility]    Progress Note Due on Visit 10    OT Start Time 1534    OT Stop Time 1612    OT Time Calculation (min) 38 min    Activity Tolerance Patient tolerated treatment well    Behavior During Therapy WFL for tasks assessed/performed           Past Medical History:  Diagnosis Date  . Carpal tunnel syndrome   . Gout   . Hyperlipidemia    on meds  . Hypertension    on meds    Past Surgical History:  Procedure Laterality Date  . COLONOSCOPY  2015   DJ-MAC-polyps  . LOOP RECORDER INSERTION N/A 02/19/2020   Procedure: LOOP RECORDER INSERTION;  Surgeon: Constance Haw, MD;  Location: [facility];  Service: Cardiovascular;  Laterality: N/A;  . no prior surgery    . POLYPECTOMY  2015   DJ-MAC-polyps    There were no vitals filed for this visit.   Subjective Assessment - 04/07/20 1539    Subjective  Having trouble with this right wrist.    Patient is accompanied by: Family member    Currently in Pain? Yes    Pain Score 4     Pain Location Wrist    Pain Orientation Right    Pain Descriptors / Indicators Sore    Pain Type Acute pain    Pain Onset In the past 7 days                 TREATMENT  Neuromuscular Reeducation for RUE proximal range of motion and reducing shoulder hiking and compensatory strategies/movements. Pt very receptive to cueing. Worked on scapular retraction and strengthening of rhomboids for increasing scap stabilization. 10 x scap retraction with yellow  theraband at navel level in sitting. 10x forearm wall push ups. 10x wall slides on forearms. Reviewed supine exercises with foam roll. 10 x 2 sets of forward reaching with green physioball               OT Short Term Goals - 04/07/20 1559      OT SHORT TERM GOAL #1   Title Patient will complete a home exercise program designed to improve RUE range of motion    Time 4    Period Weeks    Status On-going   issued supine cane ex   Target Date 05/02/20      OT SHORT TERM GOAL #2   Title Patient will utilize RUE to grasp release feeding utensil with min assist    Time 4    Period Weeks    Status On-going      OT SHORT TERM GOAL #3   Title Patient will tie shoes bimanually with increased time and min assist    Time 4    Period Weeks    Status Achieved      OT SHORT TERM GOAL #4   Title Patient will zipper light weight jacket, or button 2 buttons on shirt with  increased time and min assist    Time 4    Period Weeks    Status New      OT SHORT TERM GOAL #5   Title Patient will utilize BUE to wash a lightweight dish while standing at sink    Time 4    Period Weeks    Status Achieved             OT Long Term Goals - 03/31/20 1808      OT LONG TERM GOAL #1   Title Patient will complete and updated HEP to address RUE ROM and functional strength    Time 12    Period Weeks    Status New      OT LONG TERM GOAL #2   Title Patient will utilize BUE to weave chair seating (caning) with increased time and intermittent support    Time 12    Period Weeks    Status New      OT LONG TERM GOAL #3   Title Patient will write a full sentence with RUE legibly    Time 8    Period Weeks    Status New      OT LONG TERM GOAL #4   Title Patient will reach into overhead cabinet to obtain a lightweight object (less than 3 lb) with RUE    Time 12    Period Weeks    Status New      OT LONG TERM GOAL #5   Title Patient willutilize mallet with RUE to tap to open joints of chair  in furniture restoration project    Time 12    Period Weeks    Status New      OT LONG TERM GOAL #6   Title Patient will demonstrate understanding of recommendations relating to retunring to driving    Time 12    Period Weeks    Status New                 Plan - 04/07/20 1806    Clinical Impression Statement Pt has increased discomfort with RUE this day. Increased pain and swelling and inflammation.    OT Occupational Profile and History Detailed Assessment- Review of Records and additional review of physical, cognitive, psychosocial history related to current functional performance    Occupational performance deficits (Please refer to evaluation for details): ADL's;IADL's;Work    Body Structure / Function / Physical Skills ADL;Coordination;Endurance;GMC;UE functional use;Balance;Decreased knowledge of precautions;Fascial restriction;Pain;IADL;Flexibility;Decreased knowledge of use of DME;Body mechanics;Dexterity;FMC;Strength;Tone;ROM    Rehab Potential Excellent    Clinical Decision Making Several treatment options, min-mod task modification necessary    Comorbidities Affecting Occupational Performance: May have comorbidities impacting occupational performance    Modification or Assistance to Complete Evaluation  Min-Moderate modification of tasks or assist with assess necessary to complete eval    OT Frequency 2x / week    OT Duration 12 weeks    OT Treatment/Interventions Self-care/ADL training;Electrical Stimulation;Therapeutic exercise;Aquatic Therapy;Moist Heat;Neuromuscular education;Compression bandaging;Splinting;Patient/family education;Balance training;Therapeutic activities;Functional Mobility Training;Fluidtherapy;Cryotherapy;Ultrasound;Contrast Bath;DME and/or AE instruction;Manual Therapy;Passive range of motion    Plan check HEP coordination.  May benefit from fluido then stretch to digits for improved extension. Kepp working on proximal RUE control with less trunk and  neck involvement, check inflammation in RUE wrist.    OT Home Exercise Plan coordination HEP    Consulted and Agree with Plan of Care Patient;Family member/caregiver    Family Member Consulted Wife- [name]  Patient will benefit from skilled therapeutic intervention in order to improve the following deficits and impairments:   Body Structure / Function / Physical Skills: ADL,Coordination,Endurance,GMC,UE functional use,Balance,Decreased knowledge of precautions,Fascial restriction,Pain,IADL,Flexibility,Decreased knowledge of use of DME,Body mechanics,Dexterity,FMC,Strength,Tone,ROM       Visit Diagnosis: Hemiplegia and hemiparesis following cerebral infarction affecting right dominant side (HCC)  Unsteadiness on feet  Other abnormalities of gait and mobility  Muscle weakness (generalized)  Pain in right wrist    Problem List Patient Active Problem List   Diagnosis Date Noted  . Elevated BUN   . Sleep disturbance   . Infarction of left basal ganglia ([place]) 02/21/2020  . Right hemiparesis ([place])   . Leukocytosis   . Sinus tachycardia   . Dyslipidemia   . History of gout   . Left sided lacunar infarction ([place]) 02/16/2020  . Chronic hepatitis C without hepatic coma ([place]) 01/20/2016  . Lumbar disc disease with radiculopathy 03/07/2012  . Essential hypertension 12/14/2009  . RHINITIS 03/26/2008  . Gout 01/15/2007  . ERECTILE DYSFUNCTION 01/15/2007  . CARPAL TUNNEL SYNDROME, RIGHT 01/15/2007    Zachery Conch MOT, OTR/L  04/07/2020, 6:07 PM  [facility] [address] [place], [place], [address] Phone: [phone]   Fax:  [phone]  Name: Brent Chio Sr. MRN: [id number] Date of Birth: 01-08-1951

## 2020-04-07 NOTE — Telephone Encounter (Signed)
Can this patient receive a refill?   Not original prescribing provider.   Last refill- 03/03/2020---30 tabs no refills Last labs- 02/17/2020

## 2020-04-07 NOTE — Telephone Encounter (Signed)
Tommi Rumps, I know you see Brent Mccormick tomorrow. I wanted to send over his recent therapy BP readings as have been running a bit high. I will list them below. Also, today he reported left lateral foot pain worse with weight bearing denying and injury. He didn't have any tenderness to palpation or noted swelling but I did see his 2nd toe is still red from recent gout flare. His right wrist was also more red and swollen today. I know he finished the prednisone last week. I'm wondering if the gout is still an ongoing issue if you could check this tomorrow as well. Thanks so much!  03/26/20 160/100 03/31/20 132/84 rest, 152/88 activity 04/02/20 158/95 04/07/20 140/86  Cherly Anderson, PT, DPT, NCS

## 2020-04-08 ENCOUNTER — Encounter: Payer: Self-pay | Admitting: Adult Health

## 2020-04-08 ENCOUNTER — Ambulatory Visit (INDEPENDENT_AMBULATORY_CARE_PROVIDER_SITE_OTHER): Payer: 59 | Admitting: Adult Health

## 2020-04-08 ENCOUNTER — Other Ambulatory Visit: Payer: Self-pay | Admitting: Adult Health

## 2020-04-08 VITALS — BP 130/80 | HR 92 | Temp 98.2°F | Wt 166.2 lb

## 2020-04-08 DIAGNOSIS — I10 Essential (primary) hypertension: Secondary | ICD-10-CM | POA: Diagnosis not present

## 2020-04-08 DIAGNOSIS — M109 Gout, unspecified: Secondary | ICD-10-CM

## 2020-04-08 MED ORDER — PREDNISONE 20 MG PO TABS: 20.0000 mg | ORAL_TABLET | Freq: Every day | ORAL | 0 refills | Status: DC

## 2020-04-08 MED ORDER — COLCHICINE 0.6 MG PO TABS: 0.6000 mg | ORAL_TABLET | Freq: Two times a day (BID) | ORAL | 1 refills | Status: DC

## 2020-04-08 MED ORDER — AMLODIPINE BESYLATE 2.5 MG PO TABS: 2.5000 mg | ORAL_TABLET | Freq: Every day | ORAL | 0 refills | Status: DC

## 2020-04-08 MED FILL — predniSONE 20 MG TABS: 20 | 7 days supply | Qty: 7 | Fill #0

## 2020-04-08 MED FILL — AMLODIPINE 2.5 MG TABLET: 2.5 | 90 days supply | Qty: 90 | Fill #0

## 2020-04-08 MED FILL — COLCHICINE 0.6 MG TABS: 0.6 | 90 days supply | Qty: 180 | Fill #0

## 2020-04-08 NOTE — Patient Instructions (Signed)
It was great seeing you today   I am going to check your uric acid level today and will follow up with you regarding that blood work   I have sent in a 7 day course of prednisone and will keep you on colchicine twice daily for the next 6 months or so   I have added Norvasc 2.5 mg daily to help lower your blood pressure   Please follow up in 2 weeks via virtual visit to see how you are doing

## 2020-04-08 NOTE — Progress Notes (Signed)
Subjective:    Patient ID: Brent Aloe Sr., male    DOB: 1950/12/30, 70 y.o.   MRN: [id number]  HPI 70 year old male who  has a past medical history of Carpal tunnel syndrome, Gout, Hyperlipidemia, and Hypertension.  He presents to the office today for evaluation of elevated blood pressure readings.  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  Has been noted that while at physical therapy his blood pressure has been elevated. He has been taking Losartan 100 mg daily.   His wife who is with him today has been checking his blood pressures at home during rest and per her log he has had blood pressure readings between 113 and 150s/90-90's.  Patient reports being asymptomatic of hyper or hypotension.  BP Readings from Last 10 Encounters:  04/08/20 130/80  04/07/20 140/86  04/02/20 (!) 158/95  04/01/20 140/78  03/31/20 132/84  03/17/20 110/80  03/16/20 (!) 134/91  03/04/20 124/79  02/21/20 126/84  09/24/19 (!) 157/93   Additionally, he has been experiencing a recurrent gout flare in his right wrist and left foot over the last week.  He was evaluated for gout via video visit approximately 20 days ago for being discharged from the hospital after stroke.  At this time he was taking colchicine 0.6 mg every other day and in the past this controlled his gout flares well.  He was prescribed a prednisone taper and advised to take colchicine twice a day until his symptoms resolved and then he could cut back to taking colchicine every day.  He reports that his gout flare resolved after the prednisone therapy but that about a week ago his symptoms came back.  Review of Systems See HPI   Past Medical History:  Diagnosis Date  . Carpal tunnel syndrome   . Gout   . Hyperlipidemia    on meds  . Hypertension    on meds    Social History   Socioeconomic History  . Marital status: Married    Spouse name: Not on file  . Number  of children: Not on file  . Years of education: Not on file  . Highest education level: Not on file  Occupational History  . Not on file  Tobacco Use  . Smoking status: Former Smoker    Quit date: 01/17/1966    Years since quitting: 54.2  . Smokeless tobacco: Never Used  Vaping Use  . Vaping Use: Never used  Substance and Sexual Activity  . Alcohol use: Yes    Comment: occassionally  . Drug use: No  . Sexual activity: Yes  Other Topics Concern  . 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    Married    Two children, live locally.       Three dogs and two cats    Social Determinants of Health   Financial Resource Strain: Not on file  Food Insecurity: Not on file  Transportation Needs: Not on file  Physical Activity: Not on file  Stress: Not on file  Social Connections: Not on file  Intimate Partner Violence: Not on file    Past Surgical History:  Procedure Laterality Date  . COLONOSCOPY  2015   DJ-MAC-polyps  . LOOP RECORDER INSERTION N/A 02/19/2020   Procedure: LOOP RECORDER INSERTION;  Surgeon: Constance Haw, MD;  Location: [facility];  Service: Cardiovascular;  Laterality: N/A;  . no prior surgery    . POLYPECTOMY  2015   DJ-MAC-polyps    Family History  Problem Relation Age of Onset  . Ovarian cancer Mother   . Heart disease Father   . Hypertension Father   . Colon polyps Sister 71  . Colon cancer Neg Hx   . Esophageal cancer Neg Hx   . Rectal cancer Neg Hx   . Stomach cancer Neg Hx     No Known Allergies  Current Outpatient Medications on File Prior to Visit  Medication Sig Dispense Refill  . acetaminophen (TYLENOL) 325 MG tablet Take 2 tablets (650 mg total) by mouth every 4 (four) hours as needed for mild pain (or temp > 37.5 C (99.5 F)).    . cholecalciferol (VITAMIN D3) 25 MCG (1000 UNIT) tablet Take 1 tablet (1,000 Units total) by mouth daily. 30  tablet 0  . clopidogrel (PLAVIX) 75 MG tablet Take 1 tablet (75 mg total) by mouth daily. 30 tablet 1  . cyclobenzaprine (FLEXERIL) 10 MG tablet Take 1 tablet (10 mg total) by mouth 3 (three) times daily as needed for muscle spasms. 90 tablet 0  . losartan (COZAAR) 100 MG tablet Take 1 tablet (100 mg total) by mouth daily. 90 tablet 3  . melatonin 3 MG TABS tablet Take 1 tablet (3 mg total) by mouth at bedtime. 30 tablet 0  . pantoprazole (PROTONIX) 40 MG tablet Take 1 tablet (40 mg total) by mouth at bedtime. 30 tablet 0  . rosuvastatin (CRESTOR) 20 MG tablet TAKE 1 TABLET BY MOUTH DAILY 90 tablet 0   No current facility-administered medications on file prior to visit.    BP 130/80 (BP Location: Left Arm, Patient Position: Sitting, Cuff Size: Normal)   Pulse 92   Temp 98.2 F (36.8 C) (Oral)   Wt 166 lb 3.2 oz (75.4 kg)   SpO2 97%   BMI 23.85 kg/m       Objective:   Physical Exam Vitals and nursing note reviewed.  Constitutional:      Appearance: Normal appearance.  Cardiovascular:     Rate and Rhythm: Normal rate and regular rhythm.     Pulses: Normal pulses.     Heart sounds: Normal heart sounds.  Pulmonary:     Effort: Pulmonary effort is normal.     Breath sounds: Normal breath sounds.  Musculoskeletal:        General: Normal range of motion.  Skin:    General: Skin is warm and dry.     Findings: Erythema present.     Comments: Localized erythema, warmness, and pain noted to dorsal aspect of right wrist is well as at the base of his left great toe  Neurological:     General: No focal deficit present.     Mental Status: He is alert and oriented to person, place, and time.  Psychiatric:        Mood and Affect: Mood normal.        Behavior: Behavior normal.        Thought Content: Thought content normal.        Judgment: Judgment normal.       Assessment & Plan:  1. Acute gout involving toe of right foot, unspecified cause -Treat with prednisone therapy.  We  will have him start taking colchicine 0.6 mg twice daily for the next 3 to 6 months and then we will reevaluate. - predniSONE (DELTASONE) 20 MG tablet; Take  1 tablet (20 mg total) by mouth daily with breakfast.  Dispense: 7 tablet; Refill: 0 - colchicine 0.6 MG tablet; Take 1 tablet (0.6 mg total) by mouth 2 (two) times daily.  Dispense: 180 tablet; Refill: 1 - Uric Acid; Future - Uric Acid  2. Essential hypertension -We will add Norvasc 2.5 mg to his daily regimen.  He will follow-up in 2 weeks via virtual visit to discuss blood pressure readings.  Was advised to follow-up sooner if he develops dizziness, lightheadedness, or syncopal episodes - amLODipine (NORVASC) 2.5 MG tablet; Take 1 tablet (2.5 mg total) by mouth daily.  Dispense: 90 tablet; Refill: 0   Dorothyann Peng, NP

## 2020-04-09 LAB — URIC ACID: Uric Acid, Serum: 5.9 mg/dL (ref 4.0–7.8)

## 2020-04-10 ENCOUNTER — Other Ambulatory Visit: Payer: Self-pay

## 2020-04-10 ENCOUNTER — Encounter: Payer: Self-pay | Admitting: Occupational Therapy

## 2020-04-10 ENCOUNTER — Encounter: Payer: Self-pay | Admitting: Physical Therapy

## 2020-04-10 ENCOUNTER — Ambulatory Visit: Payer: 59 | Admitting: Occupational Therapy

## 2020-04-10 ENCOUNTER — Ambulatory Visit: Payer: 59 | Admitting: Physical Therapy

## 2020-04-10 VITALS — BP 140/90

## 2020-04-10 DIAGNOSIS — R2681 Unsteadiness on feet: Secondary | ICD-10-CM | POA: Diagnosis not present

## 2020-04-10 DIAGNOSIS — R2689 Other abnormalities of gait and mobility: Secondary | ICD-10-CM

## 2020-04-10 DIAGNOSIS — M6281 Muscle weakness (generalized): Secondary | ICD-10-CM | POA: Diagnosis not present

## 2020-04-10 DIAGNOSIS — I69351 Hemiplegia and hemiparesis following cerebral infarction affecting right dominant side: Secondary | ICD-10-CM

## 2020-04-10 DIAGNOSIS — R600 Localized edema: Secondary | ICD-10-CM | POA: Diagnosis not present

## 2020-04-10 DIAGNOSIS — M25531 Pain in right wrist: Secondary | ICD-10-CM

## 2020-04-10 NOTE — Therapy (Signed)
[facility] [address] [place], [place], [address] Phone: [phone]   Fax:  [phone]  Occupational Therapy Treatment  Patient Details  Name: Brent Bogart Sr. MRN: [id number] Date of Birth: 04/01/1950 Referring Provider (OT): Marlowe Shores   Encounter Date: 04/10/2020   OT End of Session - 04/10/20 1533    Visit Number 5    Number of Visits 25    Date for OT Re-Evaluation 06/16/20    Authorization Type [facility]    Progress Note Due on Visit 10    OT Start Time 1532    OT Stop Time 1615    OT Time Calculation (min) 43 min    Activity Tolerance Patient tolerated treatment well    Behavior During Therapy [facility] for tasks assessed/performed           Past Medical History:  Diagnosis Date  . Carpal tunnel syndrome   . Gout   . Hyperlipidemia    on meds  . Hypertension    on meds    Past Surgical History:  Procedure Laterality Date  . COLONOSCOPY  2015   DJ-MAC-polyps  . LOOP RECORDER INSERTION N/A 02/19/2020   Procedure: LOOP RECORDER INSERTION;  Surgeon: Constance Haw, MD;  Location: [facility];  Service: Cardiovascular;  Laterality: N/A;  . no prior surgery    . POLYPECTOMY  2015   DJ-MAC-polyps    There were no vitals filed for this visit.   Subjective Assessment - 04/10/20 1533    Subjective  "using the right hand is still challenging. walking and the foot is much better and my balance is getting better"    Patient is accompanied by: Family member    Currently in Pain? Yes    Pain Score 4     Pain Location Wrist    Pain Orientation Right    Pain Descriptors / Indicators Sore    Pain Type Acute pain    Pain Onset In the past 7 days    Pain Frequency Intermittent            TREATMENT:   Moist Heat: to RUE hand x 10 minutes for pain and stiffness before stretching. Education provided IO:XBDZ. Scap retraction exercises during moist heat. ADLs: squeezing toothpaste with  RUE, scooping PB out of jar and spreading (with effort) with RUE. Stretching: composite flexion with passive stretch, extension on table. Pt with approx 50% of thumb opposition to little finger.  Medium Pegs with RUE for increasing functional finger dexterity and movement. Mod drops with coordination and difficulty with rotating pegs in RUE. Mod difficulty with task. Copied pattern with no difficulty Finger walking with trash bag with RUE x 3 for isolating finger movements Ice Pack for inflammation at end of session x 6 minutes.                      OT Short Term Goals - 04/07/20 1559      OT SHORT TERM GOAL #1   Title Patient will complete a home exercise program designed to improve RUE range of motion    Time 4    Period Weeks    Status On-going   issued supine cane ex   Target Date 05/02/20      OT SHORT TERM GOAL #2   Title Patient will utilize RUE to grasp release feeding utensil with min assist    Time 4    Period Weeks  Status On-going      OT SHORT TERM GOAL #3   Title Patient will tie shoes bimanually with increased time and min assist    Time 4    Period Weeks    Status Achieved      OT SHORT TERM GOAL #4   Title Patient will zipper light weight jacket, or button 2 buttons on shirt with increased time and min assist    Time 4    Period Weeks    Status New      OT SHORT TERM GOAL #5   Title Patient will utilize BUE to wash a lightweight dish while standing at sink    Time 4    Period Weeks    Status Achieved             OT Long Term Goals - 03/31/20 1808      OT LONG TERM GOAL #1   Title Patient will complete and updated HEP to address RUE ROM and functional strength    Time 12    Period Weeks    Status New      OT LONG TERM GOAL #2   Title Patient will utilize BUE to weave chair seating (caning) with increased time and intermittent support    Time 12    Period Weeks    Status New      OT LONG TERM GOAL #3   Title Patient will  write a full sentence with RUE legibly    Time 8    Period Weeks    Status New      OT LONG TERM GOAL #4   Title Patient will reach into overhead cabinet to obtain a lightweight object (less than 3 lb) with RUE    Time 12    Period Weeks    Status New      OT LONG TERM GOAL #5   Title Patient willutilize mallet with RUE to tap to open joints of chair in furniture restoration project    Time 12    Period Weeks    Status New      OT LONG TERM GOAL #6   Title Patient will demonstrate understanding of recommendations relating to retunring to driving    Time 12    Period Weeks    Status New                 Plan - 04/10/20 1543    Clinical Impression Statement Pt with improved swelling and pain in RUE wrist this day.    OT Occupational Profile and History Detailed Assessment- Review of Records and additional review of physical, cognitive, psychosocial history related to current functional performance    Occupational performance deficits (Please refer to evaluation for details): ADL's;IADL's;Work    Body Structure / Function / Physical Skills ADL;Coordination;Endurance;GMC;UE functional use;Balance;Decreased knowledge of precautions;Fascial restriction;Pain;IADL;Flexibility;Decreased knowledge of use of DME;Body mechanics;Dexterity;FMC;Strength;Tone;ROM    Rehab Potential Excellent    Clinical Decision Making Several treatment options, min-mod task modification necessary    Comorbidities Affecting Occupational Performance: May have comorbidities impacting occupational performance    Modification or Assistance to Complete Evaluation  Min-Moderate modification of tasks or assist with assess necessary to complete eval    OT Frequency 2x / week    OT Duration 12 weeks    OT Treatment/Interventions Self-care/ADL training;Electrical Stimulation;Therapeutic exercise;Aquatic Therapy;Moist Heat;Neuromuscular education;Compression bandaging;Splinting;Patient/family education;Balance  training;Therapeutic activities;Functional Mobility Training;Fluidtherapy;Cryotherapy;Ultrasound;Contrast Bath;DME and/or AE instruction;Manual Therapy;Passive range of motion    Plan check HEP coordination.  May benefit from fluido then stretch to digits for improved extension. Kepp working on proximal RUE control with less trunk and neck involvement, check inflammation in RUE wrist.    OT Home Exercise Plan coordination HEP    Consulted and Agree with Plan of Care Patient;Family member/caregiver    Family Member Consulted Wife- Mary           Patient will benefit from skilled therapeutic intervention in order to improve the following deficits and impairments:   Body Structure / Function / Physical Skills: ADL,Coordination,Endurance,GMC,UE functional use,Balance,Decreased knowledge of precautions,Fascial restriction,Pain,IADL,Flexibility,Decreased knowledge of use of DME,Body mechanics,Dexterity,FMC,Strength,Tone,ROM       Visit Diagnosis: Hemiplegia and hemiparesis following cerebral infarction affecting right dominant side (HCC)  Unsteadiness on feet  Other abnormalities of gait and mobility  Muscle weakness (generalized)  Pain in right wrist    Problem List Patient Active Problem List   Diagnosis Date Noted  . Elevated BUN   . Sleep disturbance   . Infarction of left basal ganglia ([facility]) 02/21/2020  . Right hemiparesis ([facility])   . Leukocytosis   . Sinus tachycardia   . Dyslipidemia   . History of gout   . Left sided lacunar infarction ([facility]) 02/16/2020  . Chronic hepatitis C without hepatic coma ([place]) 01/20/2016  . Lumbar disc disease with radiculopathy 03/07/2012  . Essential hypertension 12/14/2009  . RHINITIS 03/26/2008  . Gout 01/15/2007  . ERECTILE DYSFUNCTION 01/15/2007  . CARPAL TUNNEL SYNDROME, RIGHT 01/15/2007    Zachery Conch MOT, OTR/L  04/10/2020, 4:20 PM  [facility] [address] [place], [place], [address] Phone: [phone]   Fax:  [phone]  Name: Brent Sachse Sr. MRN: [id number] Date of Birth: [birth year]

## 2020-04-13 NOTE — Therapy (Signed)
[facility] [address] [place], [place], [address] Phone: [phone]   Fax:  [phone]  Physical Therapy Treatment  Patient Details  Name: Brent Shorb Sr. MRN: [id number] Date of Birth: 17-Dec-1950 Referring Provider (PT): Cathlyn Parsons, PA-C   Encounter Date: 04/10/2020     04/10/20 1454  PT Visits / Re-Eval  Visit Number 8  Number of Visits 17  Date for PT Re-Evaluation 05/16/20  Authorization  Authorization Type [facility]; $20 copay  PT Time Calculation  PT Start Time 1448  PT Stop Time 1530  PT Time Calculation (min) 42 min  PT - End of Session  Equipment Utilized During Treatment Gait belt  Activity Tolerance Patient tolerated treatment well (elevated BP with activity.)  Behavior During Therapy WFL for tasks assessed/performed    Past Medical History:  Diagnosis Date  . Carpal tunnel syndrome   . Gout   . Hyperlipidemia    on meds  . Hypertension    on meds    Past Surgical History:  Procedure Laterality Date  . COLONOSCOPY  2015   DJ-MAC-polyps  . LOOP RECORDER INSERTION N/A 02/19/2020   Procedure: LOOP RECORDER INSERTION;  Surgeon: Constance Haw, MD;  Location: [facility];  Service: Cardiovascular;  Laterality: N/A;  . no prior surgery    . POLYPECTOMY  2015   DJ-MAC-polyps    Vitals:   04/10/20 1451 04/10/20 1514 04/10/20 1526  BP: (!) 142/82 140/88 140/90        04/10/20 1451  Symptoms/Limitations  Subjective Back on meds for gout, foot is pain free. Still with wrist pain/swelling. Also on new BP medication which seems to be helping. Pressures at home have been better.  Patient is accompained by: Family member  Pertinent History carpal tunnel, HTN, HLD, DM, gout, and former smoker.  Patient Stated Goals Increase use of RUE; walk without the walker.  Pain Assessment  Currently in Pain? Yes  Pain Score 4  Pain Location Wrist  Pain Orientation Right  Pain  Descriptors / Indicators Sore  Pain Type Acute pain  Pain Onset 1 to 4 weeks ago  Pain Frequency Intermittent  Aggravating Factors  movements- mostly wrist flexion  Pain Relieving Factors ice, back on gout medications,      04/10/20 1459  Transfers  Transfers Sit to Stand;Stand to Sit  Sit to Stand 4: Min guard  Stand to Sit 5: Supervision;4: Min guard  Ambulation/Gait  Ambulation/Gait Yes  Ambulation/Gait Assistance 5: Supervision;4: Min guard  Ambulation/Gait Assistance Details around gym with session  Assistive device Straight cane;None  Gait Pattern Step-through pattern;Decreased stance time - left;Antalgic  Neuro Re-ed   Neuro Re-ed Details  for balance/NMR: gait around track with no AD working on scanning all directions randomly. then second bout of gait working on sudden stops/starts, fwd/bwd gait with quick direction changes, and scannning all directions with faster gait speed. Min guard assist with all gait activities performed with no balance loss noted.        04/10/20 1506  Balance Exercises: Standing  Standing Eyes Closed Wide (BOA);Head turns;Foam/compliant surface;Other reps (comment);30 secs;Limitations  Standing Eyes Closed Limitations standing across blue foam beam with feet hip width apart: EC 30 seconds x 3 reps, progressing to EC head movements left<>right, up<>down for 10 reps each. min gaurd to min assist for balance with cues on posture and weight shifting to assist with balance.  SLS with Vectors Foam/compliant surface;Other reps (comment);Limitations  SLS with Vectors Limitations  standing across blue foam beam with 2 tall cones on floor in front: altenating forward, cross, double forward, double cross foot taps for ~10 reps each. Intermittent touch to bars for balance with cues on stance and weight shifting to assist with balance.  Balance Beam standing across blue foam beam: alternating forward heel taps to floor/back onto the beam, then alternating backward  toe taps/back onto beam for ~10 reps each. cues on weight shifting and for inceased step height to clear beam surface. Min guard assist with no UE support.  Tandem Gait Forward;Retro;Intermittent upper extremity support;Foam/compliant surface;4 reps;Limitations  Tandem Gait Limitations on blue foam beam with light to no UE support, min guard to min assist with cues for step placement on the beam.  Sidestepping Foam/compliant support;4 reps;Limitations  Sidestepping Limitations on blue foam beam with no UE support with cues on posture and to lift feet with stepping/not sliding them.        PT Short Term Goals - 03/17/20 1439      PT SHORT TERM GOAL #1   Title Pt will demonstrate independence with initial balance/LE strengthening HEP    Time 4    Period Weeks    Status New    Target Date 04/16/20      PT SHORT TERM GOAL #2   Title Pt will increase 30 second sit to stand to 10 repetitions without use of UE to indicate increased functional LE strength    Baseline 5.5 reps    Time 4    Period Weeks    Status New    Target Date 04/16/20      PT SHORT TERM GOAL #3   Title Pt will decrease time to perform TUG with RW to </= 17 seconds and without RW to </= 20 seconds to indicate decreased falls risk    Baseline 21 seconds with RW, 24 seconds without RW    Time 4    Period Weeks    Status New    Target Date 04/16/20      PT SHORT TERM GOAL #4   Title Pt will increase BERG score by 5 points to indicate decreased risk for falls    Baseline 32/56    Time 4    Period Weeks    Status New    Target Date 04/16/20      PT SHORT TERM GOAL #5   Title Pt will ambulate x 230' over level, indoor surfaces with L and R turns safely with supervision without RW    Time 4    Period Weeks    Status New    Target Date 04/16/20             PT Long Term Goals - 03/17/20 1444      PT LONG TERM GOAL #1   Title Pt will be independent with final HEP    Time 8    Period Weeks    Status New     Target Date 05/16/20      PT LONG TERM GOAL #2   Title Pt will report overall improvement in function on FOTO to >/= 64%    Baseline 50%    Time 8    Period Weeks    Status New    Target Date 05/16/20      PT LONG TERM GOAL #3   Title Pt will ambulate x 500' outside over paved surfaces, negotiate curb and negotiate 8 stairs with one rail with supervision (no AD)  Time 8    Period Weeks    Status New    Target Date 05/16/20      PT LONG TERM GOAL #4   Title Pt will increase to 15 reps for 30 second sit to stand to indicate increased functional LE strength    Time 8    Period Weeks    Status New    Target Date 05/16/20      PT LONG TERM GOAL #5   Title Pt will decrease time to perform TUG without AD to </= 15 seconds    Time 8    Period Weeks    Status New    Target Date 05/16/20      Additional Long Term Goals   Additional Long Term Goals Yes      PT LONG TERM GOAL #6   Title Pt will increase BERG to >/= 42/56 to indicate decreased falls risk    Time 8    Period Weeks    Status New    Target Date 05/16/20             04/10/20 1454  Plan  Clinical Impression Statement Today's skilled session initially focused on dynamic gait activities with only min guard assist needed, no balance loss noted. Pt cleared to walk short distances around home only without device with distance supervision from family/friends. Pt and spouse verblized understanding of this. Remainder of session focused on balance training on compliant surfaces with min guard to min assist for balance. The pt is progressing toward goals and should benefit from continued PT to progress toward unmet goals.  Personal Factors and Comorbidities Comorbidity 3+;Profession  Comorbidities carpal tunnel, HTN, HLD, DM, gout, and former smoker  Examination-Activity Limitations Locomotion Level;Transfers;Stairs  Examination-Participation Restrictions Driving;Occupation;Community Activity  Pt will benefit from  skilled therapeutic intervention in order to improve on the following deficits Abnormal gait;Decreased balance;Decreased strength;Pain  Stability/Clinical Decision Making Evolving/Moderate complexity  Rehab Potential Good  PT Frequency 2x / week  PT Duration 8 weeks  PT Treatment/Interventions ADLs/Self Care Home Management;Aquatic Therapy;Cryotherapy;Electrical Stimulation;Moist Heat;DME Instruction;Gait training;Stair training;Functional mobility training;Therapeutic activities;Therapeutic exercise;Balance training;Neuromuscular re-education;Patient/family education;Orthotic Fit/Training;Manual techniques;Passive range of motion  PT Next Visit Plan Monitor BP- was [facility] session..  continue dynamic gait activities. Continue RLE strengthening and balance training. Rockerboard activities. SLS activities.  Consulted and Agree with Plan of Care Patient;Family member/caregiver  Family Member Consulted wife          Patient will benefit from skilled therapeutic intervention in order to improve the following deficits and impairments:  Abnormal gait,Decreased balance,Decreased strength,Pain  Visit Diagnosis: Hemiplegia and hemiparesis following cerebral infarction affecting right dominant side (HCC)  Unsteadiness on feet  Other abnormalities of gait and mobility  Muscle weakness (generalized)     Problem List Patient Active Problem List   Diagnosis Date Noted  . Elevated BUN   . Sleep disturbance   . Infarction of left basal ganglia ([facility]) 02/21/2020  . Right hemiparesis ([facility])   . Leukocytosis   . Sinus tachycardia   . Dyslipidemia   . History of gout   . Left sided lacunar infarction ([facility]) 02/16/2020  . Chronic hepatitis C without hepatic coma ([facility]) 01/20/2016  . Lumbar disc disease with radiculopathy 03/07/2012  . Essential hypertension 12/14/2009  . RHINITIS 03/26/2008  . Gout 01/15/2007  . ERECTILE DYSFUNCTION 01/15/2007  . CARPAL TUNNEL SYNDROME, RIGHT 01/15/2007     Willow Ora, PTA, [facility] [address], [place], [place]  [address] [phone] 04/13/20, 6:08 PM   Name: Brent Aloe Sr. MRN: [id number] Date of Birth: [birth year]

## 2020-04-14 ENCOUNTER — Other Ambulatory Visit (HOSPITAL_COMMUNITY): Payer: Self-pay

## 2020-04-14 ENCOUNTER — Other Ambulatory Visit: Payer: Self-pay

## 2020-04-14 ENCOUNTER — Encounter: Payer: Self-pay | Admitting: Occupational Therapy

## 2020-04-14 ENCOUNTER — Ambulatory Visit: Payer: 59 | Admitting: Occupational Therapy

## 2020-04-14 ENCOUNTER — Encounter: Payer: Self-pay | Admitting: Physical Therapy

## 2020-04-14 ENCOUNTER — Ambulatory Visit: Payer: 59 | Admitting: Physical Therapy

## 2020-04-14 VITALS — BP 140/90

## 2020-04-14 DIAGNOSIS — R2689 Other abnormalities of gait and mobility: Secondary | ICD-10-CM

## 2020-04-14 DIAGNOSIS — R2681 Unsteadiness on feet: Secondary | ICD-10-CM

## 2020-04-14 DIAGNOSIS — M6281 Muscle weakness (generalized): Secondary | ICD-10-CM | POA: Diagnosis not present

## 2020-04-14 DIAGNOSIS — R600 Localized edema: Secondary | ICD-10-CM | POA: Diagnosis not present

## 2020-04-14 DIAGNOSIS — I69351 Hemiplegia and hemiparesis following cerebral infarction affecting right dominant side: Secondary | ICD-10-CM

## 2020-04-14 DIAGNOSIS — M25531 Pain in right wrist: Secondary | ICD-10-CM | POA: Diagnosis not present

## 2020-04-14 NOTE — Therapy (Signed)
[facility] [address] [place], [place], [address] Phone: [phone]   Fax:  [phone]  Occupational Therapy Treatment  Patient Details  Name: Brent Serviss Sr. MRN: [id number] Date of Birth: 21-Jul-1950 Referring Provider (OT): Marlowe Shores   Encounter Date: 04/14/2020   OT End of Session - 04/14/20 1451    Visit Number 6    Number of Visits 25    Date for OT Re-Evaluation 06/16/20    Authorization Type [employer] [facility]    Progress Note Due on Visit 10    OT Start Time 1450    OT Stop Time 1530    OT Time Calculation (min) 40 min    Activity Tolerance Patient tolerated treatment well    Behavior During Therapy [facility] for tasks assessed/performed           Past Medical History:  Diagnosis Date  . Carpal tunnel syndrome   . Gout   . Hyperlipidemia    on meds  . Hypertension    on meds    Past Surgical History:  Procedure Laterality Date  . COLONOSCOPY  2015   DJ-MAC-polyps  . LOOP RECORDER INSERTION N/A 02/19/2020   Procedure: LOOP RECORDER INSERTION;  Surgeon: Constance Haw, MD;  Location: [facility];  Service: Cardiovascular;  Laterality: N/A;  . no prior surgery    . POLYPECTOMY  2015   DJ-MAC-polyps    There were no vitals filed for this visit.   Subjective Assessment - 04/14/20 1452    Subjective  "still stiff but it's feeling better"    Patient is accompanied by: Family member    Currently in Pain? No/denies    Pain Onset In the past 7 days              TREATMENT:  Fluidotherapy x 12 minutes to RUE wrist and hand for stiffness and pain relief. No adverse reactions. Followed by active assisted range of motion and passive range of motion for composite flexion. Tendon Glides RUE.  RUE functional reach and grasp/release: picking up and placing 1 inch blocks into hole approx 20" high with good shoulder movement and flexion. Pt with minimal to no shoulder hiking and/or lateral  flexion for compensatory movements.                    OT Short Term Goals - 04/07/20 1559      OT SHORT TERM GOAL #1   Title Patient will complete a home exercise program designed to improve RUE range of motion    Time 4    Period Weeks    Status On-going   issued supine cane ex   Target Date 05/02/20      OT SHORT TERM GOAL #2   Title Patient will utilize RUE to grasp release feeding utensil with min assist    Time 4    Period Weeks    Status On-going      OT SHORT TERM GOAL #3   Title Patient will tie shoes bimanually with increased time and min assist    Time 4    Period Weeks    Status Achieved      OT SHORT TERM GOAL #4   Title Patient will zipper light weight jacket, or button 2 buttons on shirt with increased time and min assist    Time 4    Period Weeks    Status New      OT SHORT TERM GOAL #5  Title Patient will utilize BUE to wash a lightweight dish while standing at sink    Time 4    Period Weeks    Status Achieved             OT Long Term Goals - 03/31/20 1808      OT LONG TERM GOAL #1   Title Patient will complete and updated HEP to address RUE ROM and functional strength    Time 12    Period Weeks    Status New      OT LONG TERM GOAL #2   Title Patient will utilize BUE to weave chair seating (caning) with increased time and intermittent support    Time 12    Period Weeks    Status New      OT LONG TERM GOAL #3   Title Patient will write a full sentence with RUE legibly    Time 8    Period Weeks    Status New      OT LONG TERM GOAL #4   Title Patient will reach into overhead cabinet to obtain a lightweight object (less than 3 lb) with RUE    Time 12    Period Weeks    Status New      OT LONG TERM GOAL #5   Title Patient willutilize mallet with RUE to tap to open joints of chair in furniture restoration project    Time 12    Period Weeks    Status New      OT LONG TERM GOAL #6   Title Patient will demonstrate  understanding of recommendations relating to retunring to driving    Time 12    Period Weeks    Status New                 Plan - 04/14/20 1507    Clinical Impression Statement Pt with improved swelling and pain in RUE wrist this day.    OT Occupational Profile and History Detailed Assessment- Review of Records and additional review of physical, cognitive, psychosocial history related to current functional performance    Occupational performance deficits (Please refer to evaluation for details): ADL's;IADL's;Work    Body Structure / Function / Physical Skills ADL;Coordination;Endurance;GMC;UE functional use;Balance;Decreased knowledge of precautions;Fascial restriction;Pain;IADL;Flexibility;Decreased knowledge of use of DME;Body mechanics;Dexterity;FMC;Strength;Tone;ROM    Rehab Potential Excellent    Clinical Decision Making Several treatment options, min-mod task modification necessary    Comorbidities Affecting Occupational Performance: May have comorbidities impacting occupational performance    Modification or Assistance to Complete Evaluation  Min-Moderate modification of tasks or assist with assess necessary to complete eval    OT Frequency 2x / week    OT Duration 12 weeks    OT Treatment/Interventions Self-care/ADL training;Electrical Stimulation;Therapeutic exercise;Aquatic Therapy;Moist Heat;Neuromuscular education;Compression bandaging;Splinting;Patient/family education;Balance training;Therapeutic activities;Functional Mobility Training;Fluidtherapy;Cryotherapy;Ultrasound;Contrast Bath;DME and/or AE instruction;Manual Therapy;Passive range of motion    Plan check HEP coordination.  May benefit from fluido then stretch to digits for improved extension. Kepp working on proximal RUE control with less trunk and neck involvement, check inflammation in RUE wrist.    OT Home Exercise Plan coordination HEP    Consulted and Agree with Plan of Care Patient;Family member/caregiver     Family Member Consulted Wife- [name]           Patient will benefit from skilled therapeutic intervention in order to improve the following deficits and impairments:   Body Structure / Function / Physical Skills: ADL,Coordination,Endurance,GMC,UE functional use,Balance,Decreased knowledge of precautions,Fascial restriction,Pain,IADL,Flexibility,Decreased  knowledge of use of DME,Body mechanics,Dexterity,FMC,Strength,Tone,ROM       Visit Diagnosis: Hemiplegia and hemiparesis following cerebral infarction affecting right dominant side (HCC)  Unsteadiness on feet  Other abnormalities of gait and mobility  Muscle weakness (generalized)  Pain in right wrist    Problem List Patient Active Problem List   Diagnosis Date Noted  . Elevated BUN   . Sleep disturbance   . Infarction of left basal ganglia ([place]) 02/21/2020  . Right hemiparesis ([place])   . Leukocytosis   . Sinus tachycardia   . Dyslipidemia   . History of gout   . Left sided lacunar infarction ([place]) 02/16/2020  . Chronic hepatitis C without hepatic coma ([place]) 01/20/2016  . Lumbar disc disease with radiculopathy 03/07/2012  . Essential hypertension 12/14/2009  . RHINITIS 03/26/2008  . Gout 01/15/2007  . ERECTILE DYSFUNCTION 01/15/2007  . CARPAL TUNNEL SYNDROME, RIGHT 01/15/2007    Zachery Conch 04/14/2020, 3:23 PM  [facility] [address] [place], [place], [address] Phone: [phone]   Fax:  [phone]  Name: Brent Rio Sr. MRN: [id number] Date of Birth: 20-Oct-1950

## 2020-04-14 NOTE — Patient Instructions (Signed)
Access Code: [email or web address] URL: [email or web address] Date: 03/26/2020 Prepared by: Cherly Anderson  Exercises Forward Step Up - 2 x daily - 7 x weekly - 1 sets - 10 reps Walking March - 2 x daily - 7 x weekly - 1 sets - 3-4 reps Backward Walking with Counter Support - 2 x daily - 7 x weekly - 1 sets - 3-4 reps Standing Single Leg Stance with Counter Support - 1 x daily - 7 x weekly - 1 sets - 3 reps - 20-30 sec hold

## 2020-04-14 NOTE — Therapy (Signed)
[facility] [address] [place], [place], [address] Phone: [phone]   Fax:  [phone]  Physical Therapy Treatment  Patient Details  Name: Brent Tat Sr. MRN: [id number] Date of Birth: Nov 07, 1950 Referring Provider (PT): Cathlyn Parsons, PA-C   Encounter Date: 04/14/2020   PT End of Session - 04/14/20 2033    Visit Number 9    Number of Visits 17    Date for PT Re-Evaluation 05/16/20    Authorization Type [employer]; $20 copay    PT Start Time 1400    PT Stop Time 1445    PT Time Calculation (min) 45 min    Activity Tolerance Patient tolerated treatment well   elevated BP with activity.   Behavior During Therapy [facility] for tasks assessed/performed           Past Medical History:  Diagnosis Date  . Carpal tunnel syndrome   . Gout   . Hyperlipidemia    on meds  . Hypertension    on meds    Past Surgical History:  Procedure Laterality Date  . COLONOSCOPY  2015   DJ-MAC-polyps  . LOOP RECORDER INSERTION N/A 02/19/2020   Procedure: LOOP RECORDER INSERTION;  Surgeon: Constance Haw, MD;  Location: [facility];  Service: Cardiovascular;  Laterality: N/A;  . no prior surgery    . POLYPECTOMY  2015   DJ-MAC-polyps    Vitals:   04/14/20 1410  BP: 140/90     Subjective Assessment - 04/14/20 1408    Subjective Using cane only outside; no cane in the house except at night if he wakes up to go to the bathroom.  Still backing up to the shower with the RW.  No falls, no pain.    Patient is accompained by: Family member    Pertinent History carpal tunnel, HTN, HLD, DM, gout, and former smoker.    Patient Stated Goals Increase use of RUE; walk without the walker.    Currently in Pain? No/denies    Pain Onset 1 to 4 weeks ago              [facility] PT Assessment - 04/14/20 1415      Assessment   Medical Diagnosis L Basal ganglia CVA      Observation/Other Assessments   Focus on Therapeutic  Outcomes (FOTO)  Stroke LE: 50%      Ambulation/Gait   Ambulation/Gait Yes    Ambulation/Gait Assistance 5: Supervision    Ambulation/Gait Assistance Details L and R turns around gym    Ambulation Distance (Feet) 230 Feet    Assistive device None    Gait Pattern Step-through pattern;Decreased stride length;Decreased stance time - right;Decreased hip/knee flexion - right    Ambulation Surface Level;Indoor      Standardized Balance Assessment   Standardized Balance Assessment Five Times Sit to Stand;Timed Up and Go Test;Berg Balance Test    Five times sit to stand comments  11 sit <> stand x 30 seconds      Berg Balance Test   Sit to Stand Able to stand without using hands and stabilize independently    Standing Unsupported Able to stand safely 2 minutes    Sitting with Back Unsupported but Feet Supported on Floor or Stool Able to sit safely and securely 2 minutes    Stand to Sit Sits safely with minimal use of hands    Transfers Able to transfer safely, minor use of hands    Standing Unsupported  with Eyes Closed Able to stand 10 seconds safely    Standing Unsupported with Feet Together Able to place feet together independently and stand 1 minute safely    From Standing, Reach Forward with Outstretched Arm Can reach confidently >25 cm (10")    From Standing Position, Pick up Object from [place] to pick up shoe safely and easily    From Standing Position, Turn to Look Behind Over each Shoulder Looks behind from both sides and weight shifts well    Turn 360 Degrees Able to turn 360 degrees safely one side only in 4 seconds or less    Standing Unsupported, Alternately Place Feet on Step/Stool Able to stand independently and safely and complete 8 steps in 20 seconds    Standing Unsupported, One Foot in [place] to plae foot ahead of the other independently and hold 30 seconds    Standing on One Leg Tries to lift leg/unable to hold 3 seconds but remains standing independently    Total  Score 51    Berg comment: 51/56      Timed Up and Go Test   TUG Normal TUG    Normal TUG (seconds) 11.28    TUG Comments no AD                         [facility] Adult PT Treatment/Exercise - 04/14/20 1415      Knee/Hip Exercises: Seated   Sit to Sand 3 sets;10 reps;without UE support;Other (comment)   feet apart compliant, feet together compliant, staggered R foot back on compliant                 PT Education - 04/14/20 2033    Education Details progress towards goals    Person(s) Educated Patient;Spouse    Methods Explanation    Comprehension Verbalized understanding            PT Short Term Goals - 04/14/20 1432      PT SHORT TERM GOAL #1   Title Pt will demonstrate independence with initial balance/LE strengthening HEP    Time 4    Period Weeks    Status Achieved    Target Date 04/16/20      PT SHORT TERM GOAL #2   Title Pt will increase 30 second sit to stand to 10 repetitions without use of UE to indicate increased functional LE strength    Baseline 5.5 reps > 11 reps    Time 4    Period Weeks    Status Achieved    Target Date 04/16/20      PT SHORT TERM GOAL #3   Title Pt will decrease time to perform TUG with RW to </= 17 seconds and without RW to </= 20 seconds to indicate decreased falls risk    Baseline 21 seconds with RW, 24 seconds without RW > 11 seconds no AD    Time 4    Period Weeks    Status Achieved    Target Date 04/16/20      PT SHORT TERM GOAL #4   Title Pt will increase BERG score by 5 points to indicate decreased risk for falls    Baseline 32/56 > 51/56    Time 4    Period Weeks    Status Achieved    Target Date 04/16/20      PT SHORT TERM GOAL #5   Title Pt will ambulate x 230' over level, indoor surfaces with  L and R turns safely with supervision without RW    Time 4    Period Weeks    Status Achieved    Target Date 04/16/20             PT Long Term Goals - 04/14/20 2035      PT LONG TERM GOAL #1    Title Pt will be independent with final HEP  (ALL LTG DUE BY 05/16/20)    Time 8    Period Weeks    Status New      PT LONG TERM GOAL #2   Title Pt will report overall improvement in function on FOTO to >/= 64%    Baseline 50%    Time 8    Period Weeks    Status New      PT LONG TERM GOAL #3   Title Pt will ambulate x 500' outside over paved surfaces, negotiate curb and negotiate 8 stairs with one rail with supervision (no AD)    Time 8    Period Weeks    Status New      PT LONG TERM GOAL #4   Title Pt will increase to 15 reps for 30 second sit to stand to indicate increased functional LE strength    Time 8    Period Weeks    Status New      PT LONG TERM GOAL #5   Title Pt will decrease time to perform TUG without AD to </= 15 seconds    Baseline 11 seconds, WNL - goal met without AD    Time 8    Period Weeks    Status Achieved      PT LONG TERM GOAL #6   Title Pt will increase BERG to >/= 54/56 to indicate decreased falls risk    Baseline 51/56    Time 8    Period Weeks    Status Revised                 Plan - 04/14/20 2036    Clinical Impression Statement Performed assessment of progress towards goals.  Pt is making fast and excellent progress and has met all STG and has met 2 LTG; TUG without AD is now WNL with LTG met; BERG LTG upgraded due to significant progress.  Began to upgrade exercises on HEP; will continue to upgrade next session.    Personal Factors and Comorbidities Comorbidity 3+;Profession    Comorbidities carpal tunnel, HTN, HLD, DM, gout, and former smoker    Examination-Activity Limitations Locomotion Level;Transfers;Stairs    Examination-Participation Restrictions Driving;Occupation;Community Activity    Stability/Clinical Decision Making Evolving/Moderate complexity    Rehab Potential Good    PT Frequency 2x / week    PT Duration 8 weeks    PT Treatment/Interventions ADLs/Self Care Home Management;Aquatic Therapy;Cryotherapy;Electrical  Stimulation;Moist Heat;DME Instruction;Gait training;Stair training;Functional mobility training;Therapeutic activities;Therapeutic exercise;Balance training;Neuromuscular re-education;Patient/family education;Orthotic Fit/Training;Manual techniques;Passive range of motion    PT Next Visit Plan Monitor BP with manual cuff.  UPDATE and progress HEP; pt is making fast progress.  continue dynamic gait activities. Continue RLE strengthening and balance training. Rockerboard activities. SLS activities.    Consulted and Agree with Plan of Care Patient;Family member/caregiver    Family Member Consulted wife           Patient will benefit from skilled therapeutic intervention in order to improve the following deficits and impairments:  Abnormal gait,Decreased balance,Decreased strength,Pain  Visit Diagnosis: Hemiplegia and hemiparesis following cerebral infarction affecting right  dominant side (HCC)  Unsteadiness on feet  Other abnormalities of gait and mobility  Muscle weakness (generalized)     Problem List Patient Active Problem List   Diagnosis Date Noted  . Elevated BUN   . Sleep disturbance   . Infarction of left basal ganglia ([place]) 02/21/2020  . Right hemiparesis ([place])   . Leukocytosis   . Sinus tachycardia   . Dyslipidemia   . History of gout   . Left sided lacunar infarction ([place]) 02/16/2020  . Chronic hepatitis C without hepatic coma ([place]) 01/20/2016  . Lumbar disc disease with radiculopathy 03/07/2012  . Essential hypertension 12/14/2009  . RHINITIS 03/26/2008  . Gout 01/15/2007  . ERECTILE DYSFUNCTION 01/15/2007  . CARPAL TUNNEL SYNDROME, RIGHT 01/15/2007    Rico Junker, PT, DPT 04/14/20    8:39 PM    [facility] [address] [place], [place], [address] Phone: [phone]   Fax:  [phone]  Name: Emet Rafanan Sr. MRN: [id number] Date of Birth: 07-05-1950

## 2020-04-16 ENCOUNTER — Ambulatory Visit: Payer: 59 | Admitting: Occupational Therapy

## 2020-04-16 ENCOUNTER — Ambulatory Visit: Payer: 59

## 2020-04-16 ENCOUNTER — Other Ambulatory Visit: Payer: Self-pay

## 2020-04-16 ENCOUNTER — Encounter: Payer: Self-pay | Admitting: Occupational Therapy

## 2020-04-16 VITALS — BP 128/78

## 2020-04-16 DIAGNOSIS — R2681 Unsteadiness on feet: Secondary | ICD-10-CM

## 2020-04-16 DIAGNOSIS — I69351 Hemiplegia and hemiparesis following cerebral infarction affecting right dominant side: Secondary | ICD-10-CM

## 2020-04-16 DIAGNOSIS — R600 Localized edema: Secondary | ICD-10-CM

## 2020-04-16 DIAGNOSIS — M6281 Muscle weakness (generalized): Secondary | ICD-10-CM | POA: Diagnosis not present

## 2020-04-16 DIAGNOSIS — R2689 Other abnormalities of gait and mobility: Secondary | ICD-10-CM

## 2020-04-16 DIAGNOSIS — M25531 Pain in right wrist: Secondary | ICD-10-CM | POA: Diagnosis not present

## 2020-04-16 NOTE — Therapy (Signed)
[facility] [address] [place], [place], [address] Phone: [phone]   Fax:  [phone]  Occupational Therapy Treatment  Patient Details  Name: Brent Carlyon Sr. MRN: [id number] Date of Birth: 11-16-1950 Referring Provider (OT): Brent Mccormick   Encounter Date: 04/16/2020   OT End of Session - 04/16/20 1510    Visit Number 7    Number of Visits 25    Date for OT Re-Evaluation 06/16/20    Authorization Type [facility] [facility]    Progress Note Due on Visit 10    OT Start Time 1400    OT Stop Time 1450    OT Time Calculation (min) 50 min    Activity Tolerance Patient tolerated treatment well    Behavior During Therapy [facility] for tasks assessed/performed           Past Medical History:  Diagnosis Date  . Carpal tunnel syndrome   . Gout   . Hyperlipidemia    on meds  . Hypertension    on meds    Past Surgical History:  Procedure Laterality Date  . COLONOSCOPY  2015   DJ-MAC-polyps  . LOOP RECORDER INSERTION N/A 02/19/2020   Procedure: LOOP RECORDER INSERTION;  Surgeon: Constance Haw, MD;  Location: [facility];  Service: Cardiovascular;  Laterality: N/A;  . no prior surgery    . POLYPECTOMY  2015   DJ-MAC-polyps    There were no vitals filed for this visit.   Subjective Assessment - 04/16/20 1410    Subjective  I am still having trouble lifting my water bottle.  I can add enought pressure to wash under my arm, and apply deodorant.    Patient is accompanied by: Family member    Currently in Pain? No/denies    Pain Score 0-No pain    Pain Location Finger (Comment which one)   thumb   Pain Orientation Right    Pain Descriptors / Indicators Sore    Pain Type Chronic pain    Pain Onset In the past 7 days    Pain Frequency Intermittent    Aggravating Factors  thumb adduction    Pain Relieving Factors rest                        OT Treatments/Exercises (OP) - 04/16/20 0001      ADLs    Eating Patient feels he lacks strength to lift water bottle to drink.  Worked on components of drinking from cup.  Initial motion from elbow, then slight shoulder flexion, then radial deviation at wrist.  Patient with slight pain in thumb with task.  However, when he decreased pressure through thumb pain resolved.  Worked on componenets, with emphsis on graded wrist control and grip.  Patient with improved hand to mouth pattern at end of session.  Patient still reports having difficulty using utensils with right hand for feeding.    Driving Discussed graduated driving program - patient to try driving with his daughter this weekend in empty lot to determine if he can safely operate the vehicle.    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  Discussed having him return to his workshop with another person and begin gentle work around the shop using two hands.  Worked on two handed sweeping, carrying crate bilaterally and weighted to 3 lbs, practised carrying unilaterally with crate on right hip.  Worked on carrying simulated clamps, and boards turning boards over to inspect.  Also  worked on functional use of right arm for turning on off switch, dimmer switch, reaching overhead to top shelf, opening cabinets and drawers, etc.  Patient lacks speed and strength in right hand, but feel he is now ready for home activites program designed to increase routine use of RUE.                  OT Education - 04/16/20 1508    Education Details Graduated driving program, functional use of RUE in workshop    Person(s) Educated Patient;Spouse    Methods Explanation;Demonstration;Handout    Comprehension Verbalized understanding;Returned demonstration            OT Short Term Goals - 04/16/20 1513      OT SHORT TERM GOAL #1   Title Patient will complete a home exercise program designed to improve RUE range of motion    Time 4    Period Weeks    Status On-going   issued supine cane ex   [employer]  Date 05/02/20      OT SHORT TERM GOAL #2   Title Patient will utilize RUE to grasp release feeding utensil with min assist    Time 4    Period Weeks    Status On-going      OT SHORT TERM GOAL #3   Title Patient will tie shoes bimanually with increased time and min assist    Time 4    Period Weeks    Status Achieved      OT SHORT TERM GOAL #4   Title Patient will zipper light weight jacket, or button 2 buttons on shirt with increased time and min assist    Time 4    Period Weeks    Status On-going      OT SHORT TERM GOAL #5   Title Patient will utilize BUE to wash a lightweight dish while standing at sink    Time 4    Period Weeks    Status Achieved             OT Long Term Goals - 04/16/20 1513      OT LONG TERM GOAL #1   Title Patient will complete and updated HEP to address RUE ROM and functional strength    Time 12    Period Weeks    Status On-going      OT LONG TERM GOAL #2   Title Patient will utilize BUE to weave chair seating (caning) with increased time and intermittent support    Time 12    Period Weeks    Status On-going      OT LONG TERM GOAL #3   Title Patient will write a full sentence with RUE legibly    Time 8    Period Weeks    Status On-going      OT LONG TERM GOAL #4   Title Patient will reach into overhead cabinet to obtain a lightweight object (less than 3 lb) with RUE    Time 12    Period Weeks    Status On-going      OT LONG TERM GOAL #5   Title Patient willutilize mallet with RUE to tap to open joints of chair in furniture restoration project    Time 12    Period Weeks    Status On-going      OT LONG TERM GOAL #6   Title Patient will demonstrate understanding of recommendations relating to retunring to driving    Time 12  Period Weeks    Status Achieved                 Plan - 04/16/20 1510    Clinical Impression Statement Pt with significant improvement in wrist pain - making progress toward OT goals.    OT  Occupational Profile and History Detailed Assessment- Review of Records and additional review of physical, cognitive, psychosocial history related to current functional performance    Occupational performance deficits (Please refer to evaluation for details): ADL's;IADL's;Work    Body Structure / Function / Physical Skills ADL;Coordination;Endurance;GMC;UE functional use;Balance;Decreased knowledge of precautions;Fascial restriction;Pain;IADL;Flexibility;Decreased knowledge of use of DME;Body mechanics;Dexterity;FMC;Strength;Tone;ROM    Rehab Potential Excellent    Clinical Decision Making Several treatment options, min-mod task modification necessary    Comorbidities Affecting Occupational Performance: May have comorbidities impacting occupational performance    Modification or Assistance to Complete Evaluation  Min-Moderate modification of tasks or assist with assess necessary to complete eval    OT Frequency 2x / week    OT Duration 12 weeks    OT Treatment/Interventions Self-care/ADL training;Electrical Stimulation;Therapeutic exercise;Aquatic Therapy;Moist Heat;Neuromuscular education;Compression bandaging;Splinting;Patient/family education;Balance training;Therapeutic activities;Functional Mobility Training;Fluidtherapy;Cryotherapy;Ultrasound;Contrast Bath;DME and/or AE instruction;Manual Therapy;Passive range of motion    Plan How did driving go?   Feeding with utensils, check HEP coordination.  May benefit from fluido then stretch to digits for improved extension. Kepp working on proximal RUE control with less trunk and neck involvement, check inflammation in RUE wrist.    OT Home Exercise Plan coordination HEP    Consulted and Agree with Plan of Care Patient;Family member/caregiver    Family Member Consulted Wife- Mary           Patient will benefit from skilled therapeutic intervention in order to improve the following deficits and impairments:   Body Structure / Function / Physical  Skills: ADL,Coordination,Endurance,GMC,UE functional use,Balance,Decreased knowledge of precautions,Fascial restriction,Pain,IADL,Flexibility,Decreased knowledge of use of DME,Body mechanics,Dexterity,FMC,Strength,Tone,ROM       Visit Diagnosis: Hemiplegia and hemiparesis following cerebral infarction affecting right dominant side (HCC)  Unsteadiness on feet  Muscle weakness (generalized)  Pain in right wrist  Localized edema    Problem List Patient Active Problem List   Diagnosis Date Noted  . Elevated BUN   . Sleep disturbance   . Infarction of left basal ganglia ([place]) 02/21/2020  . Right hemiparesis ([place])   . Leukocytosis   . Sinus tachycardia   . Dyslipidemia   . History of gout   . Left sided lacunar infarction ([place]) 02/16/2020  . Chronic hepatitis C without hepatic coma ([place]) 01/20/2016  . Lumbar disc disease with radiculopathy 03/07/2012  . Essential hypertension 12/14/2009  . RHINITIS 03/26/2008  . Gout 01/15/2007  . ERECTILE DYSFUNCTION 01/15/2007  . CARPAL TUNNEL SYNDROME, RIGHT 01/15/2007    Mariah Milling, OTR/L 04/16/2020, 3:14 PM  [facility] [address] [place], [place], [address] Phone: [phone]   Fax:  [phone]  Name: Brent Dill Sr. MRN: [id number] Date of Birth: [birth year]

## 2020-04-16 NOTE — Patient Instructions (Signed)
Graduated driving program:   With another licensed driver.   First start driving car in empty lot - try working all controls - shift, wipers, etc.  Practice starting and stopping, backing up and turning and parking.    If that goes well - try driving on a familiar road that is not busy.  For example - driving to the store or church.    Continue to add to driving challenges - adding driving on interstate, in new places, and at night.

## 2020-04-16 NOTE — Therapy (Signed)
[facility] [address] [place], [place], [address] Phone: [phone]   Fax:  [phone]  Physical Therapy Treatment/Progress note  Patient Details  Name: Brent Garno Sr. MRN: [id number] Date of Birth: 10/01/1950 Referring Provider (PT): Lavon Paganini Angiulli, PA-C   Progress Note  Reporting period 03/17/20 to 04/16/20  See Note below for Objective Data and Assessment of Progress/Goals   Encounter Date: 04/16/2020   PT End of Session - 04/16/20 1314    Visit Number 10    Number of Visits 17    Date for PT Re-Evaluation 05/16/20    Authorization Type [employer]; $20 copay, medicare secondary also    PT Start Time 1313    PT Stop Time 1400    PT Time Calculation (min) 47 min    Activity Tolerance Patient tolerated treatment well   elevated BP with activity.   Behavior During Therapy [facility] for tasks assessed/performed           Past Medical History:  Diagnosis Date  . Carpal tunnel syndrome   . Gout   . Hyperlipidemia    on meds  . Hypertension    on meds    Past Surgical History:  Procedure Laterality Date  . COLONOSCOPY  2015   DJ-MAC-polyps  . LOOP RECORDER INSERTION N/A 02/19/2020   Procedure: LOOP RECORDER INSERTION;  Surgeon: Constance Haw, MD;  Location: [facility];  Service: Cardiovascular;  Laterality: N/A;  . no prior surgery    . POLYPECTOMY  2015   DJ-MAC-polyps    Vitals:   04/16/20 1314  BP: 128/78     Subjective Assessment - 04/16/20 1317    Subjective Pt reports doing well. No pain in foot or hand other than a little at thumb when moves. BP has been good.    Patient is accompained by: Family member    Pertinent History carpal tunnel, HTN, HLD, DM, gout, and former smoker.    Patient Stated Goals Increase use of RUE; walk without the walker.    Currently in Pain? No/denies    Pain Onset In the past 7 days                             [facility] Adult PT  Treatment/Exercise - 04/16/20 1318      Ambulation/Gait   Ambulation/Gait Yes    Ambulation/Gait Assistance 5: Supervision    Ambulation/Gait Assistance Details Pt was cued to relax right arm to try to get some arm swing and increase right hip flexion.    Ambulation Distance (Feet) 230 Feet    Assistive device None    Gait Pattern Step-through pattern;Decreased hip/knee flexion - right;Decreased step length - right;Decreased stance time - right    Ambulation Surface Level;Indoor      Neuro Re-ed    Neuro Re-ed Details  Dynamic gait activities: walking with focus on increasing step length and heel strike x 115', gait with pertubations in varied directions 230', gait with 180 degree turns x 10 total on command as quick as possible, marching gait x 115'. Backwards gait 30' x 2, toe walking x 30', heel walking x 30'. Resisted gait activities at equalizer: forwards and then back, sidestepping each direction and backwards gait 15' x 4 each with close SBA/CGA. Pt has less control with sidestepping to left having to control more with LLE, walking around cone with posterior pull x 5 CW and x 5 CCW,  alternating taps on cone with posterior pull x 20, step-ups on 4" step with RLE x 10.  At bottom of step tapping 2nd step with green theraband resistance 10 x 2 on RLE, Lateral step-ups with RLE x 10. Sit to stand from mat without hands on soft blue foam beam x 10.                  PT Education - 04/16/20 1835    Education Details Added lateral step-up to HEP    Person(s) Educated Patient;Spouse    Methods Explanation;Demonstration;Handout    Comprehension Verbalized understanding;Returned demonstration            PT Short Term Goals - 04/14/20 1432      PT SHORT TERM GOAL #1   Title Pt will demonstrate independence with initial balance/LE strengthening HEP    Time 4    Period Weeks    Status Achieved    Target Date 04/16/20      PT SHORT TERM GOAL #2   Title Pt will increase 30 second  sit to stand to 10 repetitions without use of UE to indicate increased functional LE strength    Baseline 5.5 reps > 11 reps    Time 4    Period Weeks    Status Achieved    Target Date 04/16/20      PT SHORT TERM GOAL #3   Title Pt will decrease time to perform TUG with RW to </= 17 seconds and without RW to </= 20 seconds to indicate decreased falls risk    Baseline 21 seconds with RW, 24 seconds without RW > 11 seconds no AD    Time 4    Period Weeks    Status Achieved    Target Date 04/16/20      PT SHORT TERM GOAL #4   Title Pt will increase BERG score by 5 points to indicate decreased risk for falls    Baseline 32/56 > 51/56    Time 4    Period Weeks    Status Achieved    Target Date 04/16/20      PT SHORT TERM GOAL #5   Title Pt will ambulate x 230' over level, indoor surfaces with L and R turns safely with supervision without RW    Time 4    Period Weeks    Status Achieved    Target Date 04/16/20             PT Long Term Goals - 04/14/20 2035      PT LONG TERM GOAL #1   Title Pt will be independent with final HEP  (ALL LTG DUE BY 05/16/20)    Time 8    Period Weeks    Status New      PT LONG TERM GOAL #2   Title Pt will report overall improvement in function on FOTO to >/= 64%    Baseline 50%    Time 8    Period Weeks    Status New      PT LONG TERM GOAL #3   Title Pt will ambulate x 500' outside over paved surfaces, negotiate curb and negotiate 8 stairs with one rail with supervision (no AD)    Time 8    Period Weeks    Status New      PT LONG TERM GOAL #4   Title Pt will increase to 15 reps for 30 second sit to stand to indicate increased functional LE strength  Time 8    Period Weeks    Status New      PT LONG TERM GOAL #5   Title Pt will decrease time to perform TUG without AD to </= 15 seconds    Baseline 11 seconds, WNL - goal met without AD    Time 8    Period Weeks    Status Achieved      PT LONG TERM GOAL #6   Title Pt will  increase BERG to >/= 54/56 to indicate decreased falls risk    Baseline 51/56    Time 8    Period Weeks    Status Revised                 Plan - 04/16/20 1836    Clinical Impression Statement Pt showing improving stability with gait without AD in clinic today. He was able to take some pertubations with gait. Challenged with resisted gait when has to relay more on RLE. He has shown significant improvement in balance based on Berg and TUG recent assessments placing in lower fall risk category. Pt continues to benefit from skilled PT.    Personal Factors and Comorbidities Comorbidity 3+;Profession    Comorbidities carpal tunnel, HTN, HLD, DM, gout, and former smoker    Examination-Activity Limitations Locomotion Level;Transfers;Stairs    Examination-Participation Restrictions Driving;Occupation;Community Activity    Stability/Clinical Decision Making Evolving/Moderate complexity    Rehab Potential Good    PT Frequency 2x / week    PT Duration 8 weeks    PT Treatment/Interventions ADLs/Self Care Home Management;Aquatic Therapy;Cryotherapy;Electrical Stimulation;Moist Heat;DME Instruction;Gait training;Stair training;Functional mobility training;Therapeutic activities;Therapeutic exercise;Balance training;Neuromuscular re-education;Patient/family education;Orthotic Fit/Training;Manual techniques;Passive range of motion    PT Next Visit Plan continue dynamic gait activities without AD. Continue RLE strengthening and balance training. Rockerboard activities. SLS activities.    Consulted and Agree with Plan of Care Patient;Family member/caregiver    Family Member Consulted wife           Patient will benefit from skilled therapeutic intervention in order to improve the following deficits and impairments:  Abnormal gait,Decreased balance,Decreased strength,Pain  Visit Diagnosis: Other abnormalities of gait and mobility  Muscle weakness (generalized)     Problem List Patient Active  Problem List   Diagnosis Date Noted  . Elevated BUN   . Sleep disturbance   . Infarction of left basal ganglia ([place]) 02/21/2020  . Right hemiparesis ([place])   . Leukocytosis   . Sinus tachycardia   . Dyslipidemia   . History of gout   . Left sided lacunar infarction ([place]) 02/16/2020  . Chronic hepatitis C without hepatic coma ([place]) 01/20/2016  . Lumbar disc disease with radiculopathy 03/07/2012  . Essential hypertension 12/14/2009  . RHINITIS 03/26/2008  . Gout 01/15/2007  . ERECTILE DYSFUNCTION 01/15/2007  . CARPAL TUNNEL SYNDROME, RIGHT 01/15/2007    Electa Sniff, PT, DPT, NCS 04/16/2020, 6:39 PM  [facility] [address] [place], [place], [address] Phone: [phone]   Fax:  [phone]  Name: Brent Iden Sr. MRN: [id number] Date of Birth: 1950/11/05

## 2020-04-16 NOTE — Patient Instructions (Signed)
Access Code: [email or web address] URL: [email or web address] Date: 04/16/2020 Prepared by: Cherly Anderson  Exercises Walking March - 2 x daily - 7 x weekly - 1 sets - 3-4 reps Backward Walking with Counter Support - 2 x daily - 7 x weekly - 1 sets - 3-4 reps Standing Single Leg Stance with Counter Support - 1 x daily - 7 x weekly - 1 sets - 3 reps - 20-30 sec hold Sit to Stand with Right Leg Back - stand on pillow - 1 x daily - 7 x weekly - 2 sets - 10 reps Lateral Step Up - 1 x daily - 7 x weekly - 2 sets - 10 reps

## 2020-04-21 ENCOUNTER — Ambulatory Visit: Payer: 59 | Admitting: Occupational Therapy

## 2020-04-21 ENCOUNTER — Ambulatory Visit: Payer: 59 | Attending: Physician Assistant

## 2020-04-21 ENCOUNTER — Encounter: Payer: Self-pay | Admitting: Occupational Therapy

## 2020-04-21 ENCOUNTER — Other Ambulatory Visit: Payer: Self-pay

## 2020-04-21 DIAGNOSIS — I69351 Hemiplegia and hemiparesis following cerebral infarction affecting right dominant side: Secondary | ICD-10-CM

## 2020-04-21 DIAGNOSIS — M6281 Muscle weakness (generalized): Secondary | ICD-10-CM

## 2020-04-21 DIAGNOSIS — R600 Localized edema: Secondary | ICD-10-CM

## 2020-04-21 DIAGNOSIS — R2681 Unsteadiness on feet: Secondary | ICD-10-CM | POA: Insufficient documentation

## 2020-04-21 DIAGNOSIS — M25531 Pain in right wrist: Secondary | ICD-10-CM | POA: Insufficient documentation

## 2020-04-21 DIAGNOSIS — R2689 Other abnormalities of gait and mobility: Secondary | ICD-10-CM

## 2020-04-21 NOTE — Therapy (Signed)
[facility] [address] [place], [place], [address] Phone: [phone]   Fax:  [phone]  Occupational Therapy Treatment  Patient Details  Name: Brent Crumpler Sr. MRN: [id number] Date of Birth: 1950-08-07 Referring Provider (OT): Marlowe Shores   Encounter Date: 04/21/2020   OT End of Session - 04/21/20 1618    Visit Number 8    Number of Visits 25    Date for OT Re-Evaluation 06/16/20    Authorization Type [facility] [facility]    Progress Note Due on Visit 10    OT Start Time 1317    OT Stop Time 1402    OT Time Calculation (min) 45 min    Activity Tolerance Patient tolerated treatment well    Behavior During Therapy [facility] for tasks assessed/performed           Past Medical History:  Diagnosis Date  . Carpal tunnel syndrome   . Gout   . Hyperlipidemia    on meds  . Hypertension    on meds    Past Surgical History:  Procedure Laterality Date  . COLONOSCOPY  2015   DJ-MAC-polyps  . LOOP RECORDER INSERTION N/A 02/19/2020   Procedure: LOOP RECORDER INSERTION;  Surgeon: Constance Haw, MD;  Location: [facility];  Service: Cardiovascular;  Laterality: N/A;  . no prior surgery    . POLYPECTOMY  2015   DJ-MAC-polyps    There were no vitals filed for this visit.   Subjective Assessment - 04/21/20 1606    Subjective  I tried lifting my water bottle, it is much better.  Patient tried operating his car with his daughter present this weekend without difficulty.    Patient is accompanied by: Family member    Currently in Pain? No/denies    Pain Score 0-No pain                        OT Treatments/Exercises (OP) - 04/21/20 1606      ADLs   Eating Patient reports improved ability to drink from water bottle with right hand, although has not tried drinking from coffee mug.  Worked today on his ability to hold utensil in right hand to scoop food from plate, and bring food to mouth.  Did best with  modified closed chain with elbow resting on table to reduce shoulder elevation.      Exercises   Exercises Theraputty      Theraputty   Theraputty - Flatten Yellow - see patient instructions      Modalities   Modalities Fluidotherapy      RUE Fluidotherapy   Number Minutes Fluidotherapy 12 Minutes    RUE Fluidotherapy Location Hand;Wrist;Forearm    Comments To reduce stiffness in digits of right hand                  OT Education - 04/21/20 1618    Education Details putty exercises, modified closed chain for self feeding with utensils    Person(s) Educated Patient;Spouse    Methods Explanation;Demonstration;Tactile cues;Verbal cues;Handout    Comprehension Verbalized understanding;Returned demonstration            OT Short Term Goals - 04/21/20 1625      OT SHORT TERM GOAL #1   Title Patient will complete a home exercise program designed to improve RUE range of motion    Time 4    Period Weeks    Status On-going   issued supine  cane ex   Target Date 05/02/20      OT SHORT TERM GOAL #2   Title Patient will utilize RUE to grasp release feeding utensil with min assist    Time 4    Period Weeks    Status Achieved      OT SHORT TERM GOAL #3   Title Patient will tie shoes bimanually with increased time and min assist    Time 4    Period Weeks    Status Achieved      OT SHORT TERM GOAL #4   Title Patient will zipper light weight jacket, or button 2 buttons on shirt with increased time and min assist    Time 4    Period Weeks    Status On-going      OT SHORT TERM GOAL #5   Title Patient will utilize BUE to wash a lightweight dish while standing at sink    Time 4    Period Weeks    Status Achieved             OT Long Term Goals - 04/21/20 1626      OT LONG TERM GOAL #1   Title Patient will complete and updated HEP to address RUE ROM and functional strength    Time 12    Period Weeks    Status On-going      OT LONG TERM GOAL #2   Title Patient  will utilize BUE to weave chair seating (caning) with increased time and intermittent support    Time 12    Period Weeks    Status On-going      OT LONG TERM GOAL #3   Title Patient will write a full sentence with RUE legibly    Time 8    Period Weeks    Status On-going      OT LONG TERM GOAL #4   Title Patient will reach into overhead cabinet to obtain a lightweight object (less than 3 lb) with RUE    Time 12    Period Weeks    Status On-going      OT LONG TERM GOAL #5   Title Patient will utilize mallet with RUE to tap to open joints of chair in furniture restoration project    Time 12    Period Weeks    Status On-going      OT LONG TERM GOAL #6   Title Patient will demonstrate understanding of recommendations relating to retunring to driving    Time 12    Period Weeks    Status Achieved                 Plan - 04/21/20 1620    Clinical Impression Statement Pt showing steady functional improvement in ADL/IADL and use of dominant right hand    OT Occupational Profile and History Detailed Assessment- Review of Records and additional review of physical, cognitive, psychosocial history related to current functional performance    Occupational performance deficits (Please refer to evaluation for details): ADL's;IADL's;Work    Body Structure / Function / Physical Skills ADL;Coordination;Endurance;GMC;UE functional use;Balance;Decreased knowledge of precautions;Fascial restriction;Pain;IADL;Flexibility;Decreased knowledge of use of DME;Body mechanics;Dexterity;FMC;Strength;Tone;ROM    Rehab Potential Excellent    Clinical Decision Making Several treatment options, min-mod task modification necessary    Comorbidities Affecting Occupational Performance: May have comorbidities impacting occupational performance    Modification or Assistance to Complete Evaluation  Min-Moderate modification of tasks or assist with assess necessary to complete eval  OT Frequency 2x / week    OT  Duration 12 weeks    OT Treatment/Interventions Self-care/ADL training;Electrical Stimulation;Therapeutic exercise;Aquatic Therapy;Moist Heat;Neuromuscular education;Compression bandaging;Splinting;Patient/family education;Balance training;Therapeutic activities;Functional Mobility Training;Fluidtherapy;Cryotherapy;Ultrasound;Contrast Bath;DME and/or AE instruction;Manual Therapy;Passive range of motion    Plan NMR proximal RUE - Needs scap stabilizers and improved strength/range, Functional use of RUE - for eating, inhand manip, tool use    OT Home Exercise Plan coordination HEP, putty HEP    Consulted and Agree with Plan of Care Patient;Family member/caregiver    Family Member Consulted Wife- Mary           Patient will benefit from skilled therapeutic intervention in order to improve the following deficits and impairments:   Body Structure / Function / Physical Skills: ADL,Coordination,Endurance,GMC,UE functional use,Balance,Decreased knowledge of precautions,Fascial restriction,Pain,IADL,Flexibility,Decreased knowledge of use of DME,Body mechanics,Dexterity,FMC,Strength,Tone,ROM       Visit Diagnosis: Hemiplegia and hemiparesis following cerebral infarction affecting right dominant side (HCC)  Unsteadiness on feet  Muscle weakness (generalized)  Pain in right wrist  Localized edema    Problem List Patient Active Problem List   Diagnosis Date Noted  . Elevated BUN   . Sleep disturbance   . Infarction of left basal ganglia ([facility]) 02/21/2020  . Right hemiparesis ([facility])   . Leukocytosis   . Sinus tachycardia   . Dyslipidemia   . History of gout   . Left sided lacunar infarction ([facility]) 02/16/2020  . Chronic hepatitis C without hepatic coma ([facility]) 01/20/2016  . Lumbar disc disease with radiculopathy 03/07/2012  . Essential hypertension 12/14/2009  . RHINITIS 03/26/2008  . Gout 01/15/2007  . ERECTILE DYSFUNCTION 01/15/2007  . CARPAL TUNNEL SYNDROME, RIGHT 01/15/2007     Mariah Milling, OTR/L 04/21/2020, 4:27 PM  [facility] [address] [place], [place], [address] Phone: [phone]   Fax:  [phone]  Name: Brent Philson Sr. MRN: [id number] Date of Birth: [birth year]

## 2020-04-21 NOTE — Patient Instructions (Signed)
11. Grip Strengthening (Resistive Putty)   Squeeze putty using thumb and all fingers. Repeat 15 times. Do 1-2 sessions per day.   Extension (Assistive Putty)   Roll putty back and forth, being sure to use all fingertips. Repeat 3 times. Do 1-2 sessions per day.  Then pinch as below.   Palmar Pinch Strengthening (Resistive Putty)   Pinch putty between thumb and each fingertip in turn after rolling out    Finger and Thumb Extension (Resistive Putty)   With thumb and all fingers in center of putty donut, stretch out. Repeat 5-10 times. Do 1-2 sessions per day.      IP Fisting (Resistive Putty)   Keeping knuckles straight, bend fingertips to squeeze putty. Repeat 10 times. Do 1-2 sessions per day.  MP Flexion (Resistive Putty)   Bending only at large knuckles, press putty down against thumb. Keep fingertips straight. Repeat 10 times. Do 1-2 sessions per day.   Lateral Pinch Strengthening (Resistive Putty)    Squeeze between thumb and side of each finger in turn. Repeat 10 times. Do 1-2 sessions per day.   FINGERS: Extension (Putty)    Open hand and fingers to flatten putty. 5 reps per set, 1-2 sets per day,    Adduction (Resistive Putty)    Press between 2 fingers and pull putty anchored with other hand. Repeat with all fingers. Repeat 5 times. Do 1-2 sessions per day.   FINGERS: Intrinsic (Putty)    Flatten putty by separating fingers. Keep fingers straight. 5 reps per set, 1-2 sets per day    Thumb Adduction (Resistive Putty)    Press putty with thumb against index finger. Keep all fingers straight. Repeat 5 times. Do 1-2 sessions per day.    Thumb Extensors    Straighten right thumb inside putty loop anchored by fingers. Repeat 5 times. Do 1-2  sessions per day. Activity: Shoot marbles using thumb.*

## 2020-04-21 NOTE — Patient Instructions (Signed)
Access Code: [email or web address] URL: [email or web address] Date: 04/21/2020 Prepared by: Cherly Anderson  Exercises Walking March - 2 x daily - 7 x weekly - 1 sets - 3-4 reps Backward Walking with Counter Support - 2 x daily - 7 x weekly - 1 sets - 3-4 reps Standing Single Leg Stance with Counter Support - 1 x daily - 7 x weekly - 1 sets - 3 reps - 20-30 sec hold Sit to Stand with Right Leg Back - stand on pillow - 1 x daily - 7 x weekly - 2 sets - 10 reps Lateral Step Up - 1 x daily - 7 x weekly - 2 sets - 10 reps Reverse Band Walks - 2 x daily - 5 x weekly - 1 sets - 2-3 reps Band Walks - 2 x daily - 5 x weekly - 1 sets - 2-3 reps

## 2020-04-22 ENCOUNTER — Encounter: Payer: 59 | Attending: Registered Nurse | Admitting: Physical Medicine and Rehabilitation

## 2020-04-22 ENCOUNTER — Telehealth (INDEPENDENT_AMBULATORY_CARE_PROVIDER_SITE_OTHER): Payer: 59 | Admitting: Adult Health

## 2020-04-22 ENCOUNTER — Encounter: Payer: Self-pay | Admitting: Physical Medicine and Rehabilitation

## 2020-04-22 ENCOUNTER — Other Ambulatory Visit: Payer: Self-pay

## 2020-04-22 VITALS — BP 143/84 | HR 70 | Ht 70.0 in | Wt 165.8 lb

## 2020-04-22 DIAGNOSIS — I69398 Other sequelae of cerebral infarction: Secondary | ICD-10-CM

## 2020-04-22 DIAGNOSIS — G8191 Hemiplegia, unspecified affecting right dominant side: Secondary | ICD-10-CM | POA: Diagnosis not present

## 2020-04-22 DIAGNOSIS — R252 Cramp and spasm: Secondary | ICD-10-CM | POA: Insufficient documentation

## 2020-04-22 DIAGNOSIS — M109 Gout, unspecified: Secondary | ICD-10-CM

## 2020-04-22 DIAGNOSIS — I10 Essential (primary) hypertension: Secondary | ICD-10-CM

## 2020-04-22 NOTE — Progress Notes (Signed)
Subjective:    Patient ID: Brent Aloe Sr., male    DOB: [birth year], 70 y.o.   MRN: [id number]  HPI  Pt is a 70 yr old R handed male with hx of gout, HTN, HLD with new L  Basal ganglia infarct with R hemiparesis, new spasticity as well as loop recorder placement- Here for f/u on hemiparesis as well as spasticity   Feeling much stronger on R side-  RUE was affected more than RLE  Has has a couple of bouts of gout since seen him last- went on Prednisone x2 since last seen him.  Thinks finished Prednisone last week.  Affected L foot and R hand.   Functionally-  R hand is staying a little bit swollen- hard making fist and cannot move R thumb inwards as easily. - working on it with PT has good HEP.   Hasn't needed "flexeril" since came home; feels tightness- moderately better. Doesn't feel as heavy either.   Per OT,  still needs more work on R shoulder.    Did add Norvasc 2.5 mg daily 2 weeks ago as well as on Losartan 100 mg daily- BP is running 140s/80 to low 90s at home since the tweak of BP meds.    No vision issues, no dizziness,  No loss of balance.  Thinks response time is real good.  Has been driving in driveway-    Pain Inventory Average Pain 0 Pain Right Now 0 My pain is no pain  LOCATION OF PAIN    BOWEL Number of stools per week: 7 Oral laxative use No  Type of laxative  Enema or suppository use No  History of colostomy No  Incontinent No   BLADDER Normal In and out cath, frequency   na  Able to self cath No  Bladder incontinence No  Frequent urination No  Leakage with coughing No  Difficulty starting stream No  Incomplete bladder emptying No    Mobility use a walker ability to climb steps?  yes do you drive?  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  no  Physicians involved in your care Any changes since last visit?  no   Family History  Problem Relation Age of Onset  . Ovarian cancer Mother   . Heart disease Father   . Hypertension Father   . Colon polyps Sister 61  . Colon cancer Neg Hx   . Esophageal cancer Neg Hx   . Rectal cancer Neg Hx   . Stomach cancer Neg Hx    Social History   Socioeconomic History  . Marital status: Married    Spouse name: Not on file  . Number of children: Not on file  . Years of education: Not on file  . Highest education level: Not on file  Occupational History  . Not on file  Tobacco Use  . Smoking status: Former Smoker    Quit date: 01/17/1966    Years since quitting: 54.2  . Smokeless tobacco: Never Used  Vaping Use  . Vaping Use: Never used  Substance and Sexual Activity  . Alcohol use: Yes    Comment: occassionally  . Drug use: No  . Sexual activity: Yes  Other Topics Concern  . 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    Married    Two children, live locally.       Three dogs and two cats    Social Determinants of Health   Financial Resource Strain: Not on file  Food Insecurity: Not on file  Transportation Needs: Not on file  Physical Activity: Not on file  Stress: Not on file  Social Connections: Not on file   Past Surgical History:  Procedure Laterality Date  . COLONOSCOPY  2015   DJ-MAC-polyps  . LOOP RECORDER INSERTION N/A 02/19/2020   Procedure: LOOP RECORDER INSERTION;  Surgeon: Constance Haw, MD;  Location: [facility];  Service: Cardiovascular;  Laterality: N/A;  . no prior surgery    . POLYPECTOMY  2015   DJ-MAC-polyps   Past Medical History:  Diagnosis Date  . Carpal tunnel syndrome   . Gout   . Hyperlipidemia    on meds  . Hypertension    on meds   BP (!) 143/84   Pulse 70   Ht _0  (1.778 m)   Wt 165 lb 12.8 oz (75.2 kg)    SpO2 98%   BMI 23.79 kg/m   Opioid Risk Score:   Fall Risk Score:  `1  Depression screen PHQ 2/9  Depression screen [facility] 2/9 04/22/2020 03/16/2020 06/11/2019 02/27/2018 01/20/2017 01/20/2016  Decreased Interest 0 0 0 0 0 0  Down, Depressed, Hopeless 0 0 0 0 - 0  PHQ - 2 Score 0 0 0 0 0 0  Altered sleeping - 1 - - - -  Tired, decreased energy - 0 - - - -  Change in appetite - 0 - - - -  Feeling bad or failure about yourself  - 0 - - - -  Trouble concentrating - 0 - - - -  Moving slowly or fidgety/restless - 0 - - - -  Suicidal thoughts - 0 - - - -  PHQ-9 Score - 1 - - - -    Review of Systems  Constitutional: Negative.   HENT: Negative.   Eyes: Negative.   Respiratory: Negative.   Cardiovascular: Negative.   Gastrointestinal: Negative.   Endocrine: Negative.   Genitourinary: Negative.   Musculoskeletal: Negative.   Skin: Negative.   Allergic/Immunologic: Negative.   Neurological: Positive for weakness.       Right hand  Hematological: Negative.   Psychiatric/Behavioral: Negative.   All other systems reviewed and are negative.      Objective:   Physical Exam BP 143/84 today- is normal today MS: RUE- deltoid 4+/5, biceps 5-/5, tricesp 5-/5, WE 4+/5, grip 4+/5 and finger abd 4/5 LUE- 5/5 in same muscles RLE- HF 5-/5, however KE/KF DF and PF 5/5 LLE- 5/5 in same muscles  Neuro: Intact to light touch in all 4 extremities Finger to nose is intact B/L However RUE- rapid alternating movements is ~20-30% slower than LUE.  MAS of 1 in RUE at elbow wrist and shoulder- just a catch- Hoffman's (+), on RUE     Assessment & Plan:   Pt is a 70 yr old R handed male with hx of gout, HTN, HLD with new L  Basal ganglia infarct with R hemiparesis, new spasticity as well as loop recorder placement- Here for f/u on hemiparesis as well as spasticity    1. Discussed driving at length- and think pt is a little slowed in response time, based on his exam, and would prefer him  to wait except  for in empty parking lot or driveway. Response time is issue, not strength, if OT thinks driving is responsible at the end of OT, then I'm on board.   2. Ends therapy at end of April-  3. When will restart driving, ease into it- stay off highways initially, etc.  Don't want pt or others to get hurt due ot him driving too [place].   4. Agree with getting Amlodipine increased slightly- 1 major side effect of this medicine is LE swelling- keep an eye on that.   5.  Spasticity mild enough, wouldn't treat with Baclofen at this time.   6. Call me if spasticity does get worse, to treat the symptoms of spasticity- don't wait for next appointment.   7. F/U in 2 months  I spent a total of 25 minutes on appointment- specifically educating pt on spasticity, and prognosis and discussing driving.

## 2020-04-22 NOTE — Patient Instructions (Signed)
Pt is a 70 yr old R handed male with hx of gout, HTN, HLD with new L  Basal ganglia infarct with R hemiparesis, new spasticity as well as loop recorder placement- Here for f/u on hemiparesis as well as spasticity    1. Discussed driving at length- and think pt is a little slowed in response time, based on his exam, and would prefer him to wait except for in empty parking lot or driveway. Response time is issue, not strength, if OT thinks driving is responsible at the end of OT, then I'm on board.   2. Ends therapy at end of April-  3. When will restart driving, ease into it- stay off highways initially, etc.  Don't want pt or others to get hurt due ot him driving too [place].   4. Agree with getting Amlodipine increased slightly- 1 major side effect of this medicine is LE swelling- keep an eye on that.   5.  Spasticity mild enough, wouldn't treat with Baclofen at this time.   6. Call me if spasticity does get worse, to treat the symptoms of spasticity- don't wait for next appointment.   7. F/U in 2 months

## 2020-04-22 NOTE — Progress Notes (Signed)
Virtual Visit via Video Note  I connected with Brent Mccormick  on 04/22/20 at  2:00 PM EDT by a video enabled telemedicine application and verified that I am speaking with the correct person using two identifiers.  Location patient: home Location provider:work or home office Persons participating in the virtual visit: patient, provider  I discussed the limitations of evaluation and management by telemedicine and the availability of in person appointments. The patient expressed understanding and agreed to proceed.   HPI:  He is being evaluated today for 2-week follow-up regarding hypertension.  When he was last seen his blood pressure was not controlled with losartan 100 mg.  Blood pressures were elevated at home and at physical therapy with readings between 113 and 150 over 90s.  We started him on Norvasc 2.5 mg in addition to losartan 100 mg daily.  Since starting Norvasc he reports that his blood pressures have been running in the 140s over 80s to low 90s.  Denies lower extremity edema.  Gout - has resolved. Continues on Colchicine 0.6 mg BID   ROS: See pertinent positives and negatives per HPI.  Past Medical History:  Diagnosis Date  . Carpal tunnel syndrome   . Gout   . Hyperlipidemia    on meds  . Hypertension    on meds    Past Surgical History:  Procedure Laterality Date  . COLONOSCOPY  2015   DJ-MAC-polyps  . LOOP RECORDER INSERTION N/A 02/19/2020   Procedure: LOOP RECORDER INSERTION;  Surgeon: Constance Haw, MD;  Location: [facility];  Service: Cardiovascular;  Laterality: N/A;  . no prior surgery    . POLYPECTOMY  2015   DJ-MAC-polyps    Family History  Problem Relation Age of Onset  . Ovarian cancer Mother   . Heart disease Father   . Hypertension Father   . Colon polyps Sister 81  . Colon cancer Neg Hx   . Esophageal cancer Neg Hx   . Rectal cancer Neg Hx   . Stomach cancer Neg Hx        Current Outpatient Medications:  .  acetaminophen  (TYLENOL) 325 MG tablet, Take 2 tablets (650 mg total) by mouth every 4 (four) hours as needed for mild pain (or temp > 37.5 C (99.5 F))., Disp: , Rfl:  .  amLODipine (NORVASC) 2.5 MG tablet, TAKE 1 TABLET BY MOUTH DAILY, Disp: 90 tablet, Rfl: 0 .  cholecalciferol (VITAMIN D) 25 MCG (1000 UNIT) tablet, TAKE 1 TABLET BY MOUTH DAILY, Disp: 100 tablet, Rfl: 0 .  cholecalciferol (VITAMIN D3) 25 MCG (1000 UNIT) tablet, Take 1 tablet (1,000 Units total) by mouth daily., Disp: 30 tablet, Rfl: 0 .  clopidogrel (PLAVIX) 75 MG tablet, TAKE 1 TABLET BY MOUTH DAILY, Disp: 30 tablet, Rfl: 1 .  colchicine 0.6 MG tablet, TAKE 1 TABLET BY MOUTH 2 TIMES DAILY, Disp: 180 tablet, Rfl: 1 .  losartan (COZAAR) 100 MG tablet, Take 1 tablet (100 mg total) by mouth daily., Disp: 90 tablet, Rfl: 3 .  melatonin 3 MG TABS tablet, TAKE 1 TABLET (3 MG TOTAL) BY MOUTH AT BEDTIME., Disp: 60 tablet, Rfl: 0 .  pantoprazole (PROTONIX) 40 MG tablet, TAKE 1 TABLET BY MOUTH AT BEDTIME, Disp: 30 tablet, Rfl: 0 .  rosuvastatin (CRESTOR) 20 MG tablet, TAKE 1 TABLET BY MOUTH DAILY, Disp: 90 tablet, Rfl: 0  EXAM:  VITALS per patient if applicable:  GENERAL: alert, oriented, appears well and in no acute distress  HEENT: atraumatic, conjunttiva  clear, no obvious abnormalities on inspection of external nose and ears  NECK: normal movements of the head and neck  LUNGS: on inspection no signs of respiratory distress, breathing rate appears normal, no obvious gross SOB, gasping or wheezing  CV: no obvious cyanosis  MS: moves all visible extremities without noticeable abnormality  PSYCH/NEURO: pleasant and cooperative, no obvious depression or anxiety, speech and thought processing grossly intact  ASSESSMENT AND PLAN:  Discussed the following assessment and plan:  1. Essential hypertension - Will have him increase Norvasc to 5 mg daily.  - Follow up via mychart in two weeks  - Reviewed side effects of the medication   2. Acute  gout involving toe of right foot, unspecified cause - will keep him on Colchicine 0.6 BID x 3-6 months      I discussed the assessment and treatment plan with the patient. The patient was provided an opportunity to ask questions and all were answered. The patient agreed with the plan and demonstrated an understanding of the instructions.   The patient was advised to call back or seek an in-person evaluation if the symptoms worsen or if the condition fails to improve as anticipated.   Dorothyann Peng, NP

## 2020-04-22 NOTE — Therapy (Signed)
[facility] [address] [place], [place], [address] Phone: [phone]   Fax:  [phone]  Physical Therapy Treatment  Patient Details  Name: Brent Primm Sr. MRN: [id number] Date of Birth: 1950/12/16 Referring Provider (PT): Cathlyn Parsons, PA-C   Encounter Date: 04/21/2020   PT End of Session - 04/21/20 1407    Visit Number 11    Number of Visits 17    Date for PT Re-Evaluation 05/16/20    Authorization Type [employer]; $20 copay, medicare secondary also    PT Start Time 1406    PT Stop Time 1447    PT Time Calculation (min) 41 min    Activity Tolerance Patient tolerated treatment well    Behavior During Therapy [facility] for tasks assessed/performed           Past Medical History:  Diagnosis Date  . Carpal tunnel syndrome   . Gout   . Hyperlipidemia    on meds  . Hypertension    on meds    Past Surgical History:  Procedure Laterality Date  . COLONOSCOPY  2015   DJ-MAC-polyps  . LOOP RECORDER INSERTION N/A 02/19/2020   Procedure: LOOP RECORDER INSERTION;  Surgeon: Constance Haw, MD;  Location: [facility];  Service: Cardiovascular;  Laterality: N/A;  . no prior surgery    . POLYPECTOMY  2015   DJ-MAC-polyps    There were no vitals filed for this visit.   Subjective Assessment - 04/21/20 1407    Subjective Pt reports that he mowed his yard for about 20 min or so with self propelled mower. Did well and was not too tired. Sees PCP and rehab doctor tomorrow.    Patient is accompained by: Family member    Pertinent History carpal tunnel, HTN, HLD, DM, gout, and former smoker.    Patient Stated Goals Increase use of RUE; walk without the walker.    Currently in Pain? No/denies    Pain Onset In the past 7 days                             [facility] Adult PT Treatment/Exercise - 04/21/20 1408      Ambulation/Gait   Ambulation/Gait Yes    Ambulation/Gait Assistance 5:  Supervision    Ambulation/Gait Assistance Details Pt was cued to relax arms to get more arm swing and try to ride right leg back all the way to prevent slight limp on right due to decreased toe off.    Ambulation Distance (Feet) 1000 Feet    Assistive device None    Gait Pattern Step-through pattern;Decreased stance time - right    Ambulation Surface Level;Unlevel;Indoor;Outdoor;Paved;Grass    Gait Comments Outside pt performed dynamic gait activities: walking up/down grassy hill x 2 then marching up and backwards down x 2 CGA. One slight LOB posterior when stopped when backing down hill to change direction to anterior march. Marching gait on sidewalk 100' to increase SLS time. Side stepping in slight squat position 20' x 6 in grass.      Neuro Re-ed    Neuro Re-ed Details  Gait around in clinic with green theraband around legs marching x 100', large monster steps x 115 stepping diagonal a bit, 40' x 2 with large backwards steps out in diagonal as well. Verbal cues to increase left step to increase right stance time.      Exercises   Exercises Other Exercises  Knee/Hip Exercises: Aerobic   Elliptical x 3 min at level 2. BP=132/82 after. Pt reported that legs did get tired.                  PT Education - 04/22/20 0804    Education Details Added band walking exercises to HEP    Person(s) Educated Patient    Methods Explanation;Demonstration    Comprehension Verbalized understanding;Returned demonstration            PT Short Term Goals - 04/14/20 1432      PT SHORT TERM GOAL #1   Title Pt will demonstrate independence with initial balance/LE strengthening HEP    Time 4    Period Weeks    Status Achieved    Target Date 04/16/20      PT SHORT TERM GOAL #2   Title Pt will increase 30 second sit to stand to 10 repetitions without use of UE to indicate increased functional LE strength    Baseline 5.5 reps > 11 reps    Time 4    Period Weeks    Status Achieved     Target Date 04/16/20      PT SHORT TERM GOAL #3   Title Pt will decrease time to perform TUG with RW to </= 17 seconds and without RW to </= 20 seconds to indicate decreased falls risk    Baseline 21 seconds with RW, 24 seconds without RW > 11 seconds no AD    Time 4    Period Weeks    Status Achieved    Target Date 04/16/20      PT SHORT TERM GOAL #4   Title Pt will increase BERG score by 5 points to indicate decreased risk for falls    Baseline 32/56 > 51/56    Time 4    Period Weeks    Status Achieved    Target Date 04/16/20      PT SHORT TERM GOAL #5   Title Pt will ambulate x 230' over level, indoor surfaces with L and R turns safely with supervision without RW    Time 4    Period Weeks    Status Achieved    Target Date 04/16/20             PT Long Term Goals - 04/14/20 2035      PT LONG TERM GOAL #1   Title Pt will be independent with final HEP  (ALL LTG DUE BY 05/16/20)    Time 8    Period Weeks    Status New      PT LONG TERM GOAL #2   Title Pt will report overall improvement in function on FOTO to >/= 64%    Baseline 50%    Time 8    Period Weeks    Status New      PT LONG TERM GOAL #3   Title Pt will ambulate x 500' outside over paved surfaces, negotiate curb and negotiate 8 stairs with one rail with supervision (no AD)    Time 8    Period Weeks    Status New      PT LONG TERM GOAL #4   Title Pt will increase to 15 reps for 30 second sit to stand to indicate increased functional LE strength    Time 8    Period Weeks    Status New      PT LONG TERM GOAL #5   Title Pt will decrease time  to perform TUG without AD to </= 15 seconds    Baseline 11 seconds, WNL - goal met without AD    Time 8    Period Weeks    Status Achieved      PT LONG TERM GOAL #6   Title Pt will increase BERG to >/= 54/56 to indicate decreased falls risk    Baseline 51/56    Time 8    Period Weeks    Status Revised                 Plan - 04/22/20 0805     Clinical Impression Statement Pt's BP responded good to activity today WNL. PT continued to focus on gait on varied surfaces and improving gait quality with increasing right stance time. Did show improved right toe off towards end of session after practice.    Personal Factors and Comorbidities Comorbidity 3+;Profession    Comorbidities carpal tunnel, HTN, HLD, DM, gout, and former smoker    Examination-Activity Limitations Locomotion Level;Transfers;Stairs    Examination-Participation Restrictions Driving;Occupation;Community Activity    Stability/Clinical Decision Making Evolving/Moderate complexity    Rehab Potential Good    PT Frequency 2x / week    PT Duration 8 weeks    PT Treatment/Interventions ADLs/Self Care Home Management;Aquatic Therapy;Cryotherapy;Electrical Stimulation;Moist Heat;DME Instruction;Gait training;Stair training;Functional mobility training;Therapeutic activities;Therapeutic exercise;Balance training;Neuromuscular re-education;Patient/family education;Orthotic Fit/Training;Manual techniques;Passive range of motion    PT Next Visit Plan How did visits with PCP and rehab doctor go? continue dynamic gait activities without AD. Working on increasing right toe off/ stance time. Continue RLE strengthening and balance training. Rockerboard activities. SLS activities.    Consulted and Agree with Plan of Care Patient;Family member/caregiver    Family Member Consulted wife           Patient will benefit from skilled therapeutic intervention in order to improve the following deficits and impairments:  Abnormal gait,Decreased balance,Decreased strength,Pain  Visit Diagnosis: Other abnormalities of gait and mobility  Muscle weakness (generalized)     Problem List Patient Active Problem List   Diagnosis Date Noted  . Elevated BUN   . Sleep disturbance   . Infarction of left basal ganglia ([place]) 02/21/2020  . Right hemiparesis ([place])   . Leukocytosis   . Sinus tachycardia    . Dyslipidemia   . History of gout   . Left sided lacunar infarction ([place]) 02/16/2020  . Chronic hepatitis C without hepatic coma ([place]) 01/20/2016  . Lumbar disc disease with radiculopathy 03/07/2012  . Essential hypertension 12/14/2009  . RHINITIS 03/26/2008  . Gout 01/15/2007  . ERECTILE DYSFUNCTION 01/15/2007  . CARPAL TUNNEL SYNDROME, RIGHT 01/15/2007    Electa Sniff, PT, DPT, NCS 04/22/2020, 8:07 AM  [facility] [address] [place], [place], [address] Phone: [phone]   Fax:  [phone]  Name: Sufyan Meidinger Sr. MRN: [id number] Date of Birth: Mar 28, 1950

## 2020-04-23 ENCOUNTER — Encounter: Payer: Self-pay | Admitting: Occupational Therapy

## 2020-04-23 ENCOUNTER — Ambulatory Visit: Payer: 59 | Admitting: Physical Therapy

## 2020-04-23 ENCOUNTER — Ambulatory Visit: Payer: 59 | Admitting: Occupational Therapy

## 2020-04-23 ENCOUNTER — Encounter: Payer: Self-pay | Admitting: Physical Therapy

## 2020-04-23 VITALS — BP 145/90

## 2020-04-23 DIAGNOSIS — I69351 Hemiplegia and hemiparesis following cerebral infarction affecting right dominant side: Secondary | ICD-10-CM | POA: Diagnosis not present

## 2020-04-23 DIAGNOSIS — R2689 Other abnormalities of gait and mobility: Secondary | ICD-10-CM | POA: Diagnosis not present

## 2020-04-23 DIAGNOSIS — M25531 Pain in right wrist: Secondary | ICD-10-CM | POA: Diagnosis not present

## 2020-04-23 DIAGNOSIS — R2681 Unsteadiness on feet: Secondary | ICD-10-CM

## 2020-04-23 DIAGNOSIS — M6281 Muscle weakness (generalized): Secondary | ICD-10-CM

## 2020-04-23 DIAGNOSIS — R600 Localized edema: Secondary | ICD-10-CM | POA: Diagnosis not present

## 2020-04-23 NOTE — Therapy (Signed)
[facility] [address] [place], [place], [address] Phone: [phone]   Fax:  [phone]  Occupational Therapy Treatment  Patient Details  Name: Brent Olander Sr. MRN: [id number] Date of Birth: Sep 21, 1950 Referring Provider (OT): Marlowe [place]   Encounter Date: 04/23/2020   OT End of Session - 04/23/20 1315    Visit Number 9    Number of Visits 25    Date for OT Re-Evaluation 06/16/20    Authorization Type [facility]    Progress Note Due on Visit 10    OT Start Time 1315    OT Stop Time 1400    OT Time Calculation (min) 45 min    Activity Tolerance Patient tolerated treatment well    Behavior During Therapy [facility] for tasks assessed/performed           Past Medical History:  Diagnosis Date  . Carpal tunnel syndrome   . Gout   . Hyperlipidemia    on meds  . Hypertension    on meds    Past Surgical History:  Procedure Laterality Date  . COLONOSCOPY  2015   DJ-MAC-polyps  . LOOP RECORDER INSERTION N/A 02/19/2020   Procedure: LOOP RECORDER INSERTION;  Surgeon: Constance Haw, MD;  Location: [facility];  Service: Cardiovascular;  Laterality: N/A;  . no prior surgery    . POLYPECTOMY  2015   DJ-MAC-polyps    There were no vitals filed for this visit.   Subjective Assessment - 04/23/20 1315    Subjective  "just the thumb when I try and move it in" "using my right hand more to drink"    Patient is accompanied by: Family member    Currently in Pain? No/denies           TREATMENT:  Foam Roll Exercises: x 12 reps shoulder flexion, chest press, horizontal abduction  Wall Push Ups: x 12 reps for scap stabilization. Education and demonstrated understanding for doing at home.   Fluidotherapy: x 12 minutes for stiffness and pain in RUE hand and wrist. AROM for opposition of thumb and composite flexion/extension during modality.   Nuts and Bolts: worked on fine motor and using hands for  unscrewing and screwing nuts and bolts together with BUE. Emphasis on using RUE for the screw/unscrew motion with thumb and index finger.                 OT Short Term Goals - 04/21/20 1625      OT SHORT TERM GOAL #1   Title Patient will complete a home exercise program designed to improve RUE range of motion    Time 4    Period Weeks    Status On-going   issued supine cane ex   Target Date 05/02/20      OT SHORT TERM GOAL #2   Title Patient will utilize RUE to grasp release feeding utensil with min assist    Time 4    Period Weeks    Status Achieved      OT SHORT TERM GOAL #3   Title Patient will tie shoes bimanually with increased time and min assist    Time 4    Period Weeks    Status Achieved      OT SHORT TERM GOAL #4   Title Patient will zipper light weight jacket, or button 2 buttons on shirt with increased time and min assist    Time 4    Period Weeks  Status On-going      OT SHORT TERM GOAL #5   Title Patient will utilize BUE to wash a lightweight dish while standing at sink    Time 4    Period Weeks    Status Achieved             OT Long Term Goals - 04/21/20 1626      OT LONG TERM GOAL #1   Title Patient will complete and updated HEP to address RUE ROM and functional strength    Time 12    Period Weeks    Status On-going      OT LONG TERM GOAL #2   Title Patient will utilize BUE to weave chair seating (caning) with increased time and intermittent support    Time 12    Period Weeks    Status On-going      OT LONG TERM GOAL #3   Title Patient will write a full sentence with RUE legibly    Time 8    Period Weeks    Status On-going      OT LONG TERM GOAL #4   Title Patient will reach into overhead cabinet to obtain a lightweight object (less than 3 lb) with RUE    Time 12    Period Weeks    Status On-going      OT LONG TERM GOAL #5   Title Patient will utilize mallet with RUE to tap to open joints of chair in furniture  restoration project    Time 12    Period Weeks    Status On-going      OT LONG TERM GOAL #6   Title Patient will demonstrate understanding of recommendations relating to retunring to driving    Time 12    Period Weeks    Status Achieved                 Plan - 04/23/20 1340    Clinical Impression Statement Pt with good improvement with RUE but continues to have stiffness.    OT Occupational Profile and History Detailed Assessment- Review of Records and additional review of physical, cognitive, psychosocial history related to current functional performance    Occupational performance deficits (Please refer to evaluation for details): ADL's;IADL's;Work    Body Structure / Function / Physical Skills ADL;Coordination;Endurance;GMC;UE functional use;Balance;Decreased knowledge of precautions;Fascial restriction;Pain;IADL;Flexibility;Decreased knowledge of use of DME;Body mechanics;Dexterity;FMC;Strength;Tone;ROM    Rehab Potential Excellent    Clinical Decision Making Several treatment options, min-mod task modification necessary    Comorbidities Affecting Occupational Performance: May have comorbidities impacting occupational performance    Modification or Assistance to Complete Evaluation  Min-Moderate modification of tasks or assist with assess necessary to complete eval    OT Frequency 2x / week    OT Duration 12 weeks    OT Treatment/Interventions Self-care/ADL training;Electrical Stimulation;Therapeutic exercise;Aquatic Therapy;Moist Heat;Neuromuscular education;Compression bandaging;Splinting;Patient/family education;Balance training;Therapeutic activities;Functional Mobility Training;Fluidtherapy;Cryotherapy;Ultrasound;Contrast Bath;DME and/or AE instruction;Manual Therapy;Passive range of motion    Plan NMR proximal RUE - Needs scap stabilizers and improved strength/range, Functional use of RUE - for eating, inhand manip, tool use    OT Home Exercise Plan coordination HEP, putty  HEP    Consulted and Agree with Plan of Care Patient;Family member/caregiver    Family Member Consulted Wife- [name]           Patient will benefit from skilled therapeutic intervention in order to improve the following deficits and impairments:   Body Structure / Function / Physical Skills: ADL,Coordination,Endurance,GMC,UE  functional use,Balance,Decreased knowledge of precautions,Fascial restriction,Pain,IADL,Flexibility,Decreased knowledge of use of DME,Body mechanics,Dexterity,FMC,Strength,Tone,ROM       Visit Diagnosis: Hemiplegia and hemiparesis following cerebral infarction affecting right dominant side (HCC)  Muscle weakness (generalized)  Pain in right wrist    Problem List Patient Active Problem List   Diagnosis Date Noted  . Spasticity as late effect of cerebrovascular accident (CVA) 04/22/2020  . Elevated BUN   . Sleep disturbance   . Infarction of left basal ganglia ([place]) 02/21/2020  . Right hemiparesis ([place])   . Leukocytosis   . Sinus tachycardia   . Dyslipidemia   . History of gout   . Left sided lacunar infarction ([place]) 02/16/2020  . Chronic hepatitis C without hepatic coma ([place]) 01/20/2016  . Lumbar disc disease with radiculopathy 03/07/2012  . Essential hypertension 12/14/2009  . RHINITIS 03/26/2008  . Gout 01/15/2007  . ERECTILE DYSFUNCTION 01/15/2007  . CARPAL TUNNEL SYNDROME, RIGHT 01/15/2007    Zachery Conch MOT, OTR/L  04/23/2020, 1:49 PM  [facility] [address] [place], [place], [address] Phone: [phone]   Fax:  [phone]  Name: Brent Mastrangelo Sr. MRN: [id number] Date of Birth: 06/25/1950

## 2020-04-23 NOTE — Therapy (Signed)
[facility] [address] [place], [place], [address] Phone: [phone]   Fax:  [phone]  Physical Therapy Treatment  Patient Details  Name: Brent Kautzman Sr. MRN: [id number] Date of Birth: [birth year] Referring Provider (PT): Cathlyn Parsons, PA-C   Encounter Date: 04/23/2020   PT End of Session - 04/23/20 1341    Visit Number 12    Number of Visits 17    Date for PT Re-Evaluation 05/16/20    Authorization Type [employer]; $20 copay, medicare secondary also    PT Start Time 1239    PT Stop Time 1315    PT Time Calculation (min) 36 min    Activity Tolerance Patient tolerated treatment well    Behavior During Therapy [facility] for tasks assessed/performed           Past Medical History:  Diagnosis Date  . Carpal tunnel syndrome   . Gout   . Hyperlipidemia    on meds  . Hypertension    on meds    Past Surgical History:  Procedure Laterality Date  . COLONOSCOPY  2015   DJ-MAC-polyps  . LOOP RECORDER INSERTION N/A 02/19/2020   Procedure: LOOP RECORDER INSERTION;  Surgeon: Constance Haw, MD;  Location: [facility];  Service: Cardiovascular;  Laterality: N/A;  . no prior surgery    . POLYPECTOMY  2015   DJ-MAC-polyps    Vitals:   04/23/20 1248 04/23/20 1304  BP: (!) 130/95 (!) 145/90     Subjective Assessment - 04/23/20 1244    Subjective Had two doctor appointments yesterday; not cleared for driving until pt finishes with therapy.  PCP increased BP medication slightly.  Feeling good, no lightheadedness.  No LE soreness after last session.    Patient is accompained by: Family member    Pertinent History carpal tunnel, HTN, HLD, DM, gout, and former smoker.    Patient Stated Goals Increase use of RUE; walk without the walker.    Currently in Pain? No/denies    Pain Onset In the past 7 days                             [facility] Adult PT Treatment/Exercise - 04/23/20 1304       Exercises   Exercises Other Exercises    Other Exercises  Staggered stance with R foot back, therapist applied resistance while pt performed heel raises and shifting weight forwards to LLE 2 sets x 10 reps.  Also performed partial Right single leg heel raise with L foot up on step, 2 sets x 10 reps.      Knee/Hip Exercises: Aerobic   Tread Mill walking forwards uphill on 5% and then 3% incline as pt fatigued at 0.9 mph x 4 minutes; cues for increased step length, heel strike and push up at terminal stance.      Knee/Hip Exercises: Plyometrics   Bilateral Jumping 2 sets;Limitations    Bilateral Jumping Limitations 30 seconds bilat bouncing on trampoline    Unilateral Jumping 2 sets;20 reps;Limitations    Unilateral Jumping Limitations trampoline, UE support    Other Plyometric Exercises 2 sets x 10 reps trampoline jacks with UE support                  PT Education - 04/23/20 1319    Education Details push off with gait training    Person(s) Educated Patient    Methods Explanation  Comprehension Verbalized understanding            PT Short Term Goals - 04/14/20 1432      PT SHORT TERM GOAL #1   Title Pt will demonstrate independence with initial balance/LE strengthening HEP    Time 4    Period Weeks    Status Achieved    Target Date 04/16/20      PT SHORT TERM GOAL #2   Title Pt will increase 30 second sit to stand to 10 repetitions without use of UE to indicate increased functional LE strength    Baseline 5.5 reps > 11 reps    Time 4    Period Weeks    Status Achieved    Target Date 04/16/20      PT SHORT TERM GOAL #3   Title Pt will decrease time to perform TUG with RW to </= 17 seconds and without RW to </= 20 seconds to indicate decreased falls risk    Baseline 21 seconds with RW, 24 seconds without RW > 11 seconds no AD    Time 4    Period Weeks    Status Achieved    Target Date 04/16/20      PT SHORT TERM GOAL #4   Title Pt will increase BERG score  by 5 points to indicate decreased risk for falls    Baseline 32/56 > 51/56    Time 4    Period Weeks    Status Achieved    Target Date 04/16/20      PT SHORT TERM GOAL #5   Title Pt will ambulate x 230' over level, indoor surfaces with L and R turns safely with supervision without RW    Time 4    Period Weeks    Status Achieved    Target Date 04/16/20             PT Long Term Goals - 04/14/20 2035      PT LONG TERM GOAL #1   Title Pt will be independent with final HEP  (ALL LTG DUE BY 05/16/20)    Time 8    Period Weeks    Status New      PT LONG TERM GOAL #2   Title Pt will report overall improvement in function on FOTO to >/= 64%    Baseline 50%    Time 8    Period Weeks    Status New      PT LONG TERM GOAL #3   Title Pt will ambulate x 500' outside over paved surfaces, negotiate curb and negotiate 8 stairs with one rail with supervision (no AD)    Time 8    Period Weeks    Status New      PT LONG TERM GOAL #4   Title Pt will increase to 15 reps for 30 second sit to stand to indicate increased functional LE strength    Time 8    Period Weeks    Status New      PT LONG TERM GOAL #5   Title Pt will decrease time to perform TUG without AD to </= 15 seconds    Baseline 11 seconds, WNL - goal met without AD    Time 8    Period Weeks    Status Achieved      PT LONG TERM GOAL #6   Title Pt will increase BERG to >/= 54/56 to indicate decreased falls risk    Baseline 51/56  Time 8    Period Weeks    Status Revised                 Plan - 04/23/20 1342    Clinical Impression Statement No significant HTN today; slight increase in systolic after plyometric strengthening.  Treatment session mainly focused on strengthening R gastroc and hip extension for increased stance time and push off of RLE during gait.  When performing gait on treadmill with incline pt noted to have significantly decreased PF and hip extension at terminal stance and decreased knee  extension for heel strike at terminal swing.  Will continue to address and progress towards LTG.    Personal Factors and Comorbidities Comorbidity 3+;Profession    Comorbidities carpal tunnel, HTN, HLD, DM, gout, and former smoker    Examination-Activity Limitations Locomotion Level;Transfers;Stairs    Examination-Participation Restrictions Driving;Occupation;Community Activity    Stability/Clinical Decision Making Evolving/Moderate complexity    Rehab Potential Good    PT Frequency 2x / week    PT Duration 8 weeks    PT Treatment/Interventions ADLs/Self Care Home Management;Aquatic Therapy;Cryotherapy;Electrical Stimulation;Moist Heat;DME Instruction;Gait training;Stair training;Functional mobility training;Therapeutic activities;Therapeutic exercise;Balance training;Neuromuscular re-education;Patient/family education;Orthotic Fit/Training;Manual techniques;Passive range of motion    PT Next Visit Plan continue dynamic gait activities without AD. Working on increasing right toe off/ stance time - treadmill with incline, trampoline, etc.. Continue RLE strengthening and balance training. Rockerboard activities. SLS activities.    Consulted and Agree with Plan of Care Patient;Family member/caregiver    Family Member Consulted wife           Patient will benefit from skilled therapeutic intervention in order to improve the following deficits and impairments:  Abnormal gait,Decreased balance,Decreased strength,Pain  Visit Diagnosis: Hemiplegia and hemiparesis following cerebral infarction affecting right dominant side (HCC)  Unsteadiness on feet  Muscle weakness (generalized)  Other abnormalities of gait and mobility     Problem List Patient Active Problem List   Diagnosis Date Noted  . Spasticity as late effect of cerebrovascular accident (CVA) 04/22/2020  . Elevated BUN   . Sleep disturbance   . Infarction of left basal ganglia ([place]) 02/21/2020  . Right hemiparesis ([place])   .  Leukocytosis   . Sinus tachycardia   . Dyslipidemia   . History of gout   . Left sided lacunar infarction ([place]) 02/16/2020  . Chronic hepatitis C without hepatic coma ([place]) 01/20/2016  . Lumbar disc disease with radiculopathy 03/07/2012  . Essential hypertension 12/14/2009  . RHINITIS 03/26/2008  . Gout 01/15/2007  . ERECTILE DYSFUNCTION 01/15/2007  . CARPAL TUNNEL SYNDROME, RIGHT 01/15/2007    Rico Junker, PT, DPT 04/23/20    1:52 PM    [facility] [address] [place], [place], [address] Phone: [phone]   Fax:  [phone]  Name: Brent Blasius Sr. MRN: [id number] Date of Birth: Sep 25, 1950

## 2020-04-28 ENCOUNTER — Ambulatory Visit: Payer: 59

## 2020-04-28 ENCOUNTER — Encounter: Payer: Self-pay | Admitting: Occupational Therapy

## 2020-04-28 ENCOUNTER — Other Ambulatory Visit: Payer: Self-pay

## 2020-04-28 ENCOUNTER — Ambulatory Visit: Payer: 59 | Admitting: Occupational Therapy

## 2020-04-28 DIAGNOSIS — M6281 Muscle weakness (generalized): Secondary | ICD-10-CM | POA: Diagnosis not present

## 2020-04-28 DIAGNOSIS — R600 Localized edema: Secondary | ICD-10-CM | POA: Diagnosis not present

## 2020-04-28 DIAGNOSIS — I69351 Hemiplegia and hemiparesis following cerebral infarction affecting right dominant side: Secondary | ICD-10-CM

## 2020-04-28 DIAGNOSIS — R2689 Other abnormalities of gait and mobility: Secondary | ICD-10-CM | POA: Diagnosis not present

## 2020-04-28 DIAGNOSIS — M25531 Pain in right wrist: Secondary | ICD-10-CM | POA: Diagnosis not present

## 2020-04-28 DIAGNOSIS — R2681 Unsteadiness on feet: Secondary | ICD-10-CM | POA: Diagnosis not present

## 2020-04-28 NOTE — Therapy (Signed)
[facility] [address] [place], [place], [address] Phone: [phone]   Fax:  [phone]  Physical Therapy Treatment  Patient Details  Name: Brent Lardizabal Sr. MRN: [id number] Date of Birth: January 11, 1951 Referring Provider (PT): Cathlyn Parsons, PA-C   Encounter Date: 04/28/2020   PT End of Session - 04/28/20 1302    Visit Number 13    Number of Visits 17    Date for PT Re-Evaluation 05/16/20    Authorization Type [employer]; $20 copay, medicare secondary also    PT Start Time 1145    PT Stop Time 1229    PT Time Calculation (min) 44 min    Activity Tolerance Patient tolerated treatment well    Behavior During Therapy [facility] for tasks assessed/performed           Past Medical History:  Diagnosis Date  . Carpal tunnel syndrome   . Gout   . Hyperlipidemia    on meds  . Hypertension    on meds    Past Surgical History:  Procedure Laterality Date  . COLONOSCOPY  2015   DJ-MAC-polyps  . LOOP RECORDER INSERTION N/A 02/19/2020   Procedure: LOOP RECORDER INSERTION;  Surgeon: Constance Haw, MD;  Location: [facility];  Service: Cardiovascular;  Laterality: N/A;  . no prior surgery    . POLYPECTOMY  2015   DJ-MAC-polyps    There were no vitals filed for this visit.   Subjective Assessment - 04/28/20 1151    Subjective Pt reports he is feeling good today.  He was very sore in his calfs following last session and the soreness lasted for three days.  He is no longer sore today.    Patient is accompained by: Family member    Pertinent History carpal tunnel, HTN, HLD, DM, gout, and former smoker.    Patient Stated Goals Increase use of RUE; walk without the walker.    Currently in Pain? No/denies    Pain Onset In the past 7 days                             [facility] Adult PT Treatment/Exercise - 04/28/20 1206      Ambulation/Gait   Ambulation/Gait Yes    Ambulation/Gait Assistance  5: Supervision    Ambulation/Gait Assistance Details Focus on toe off RLE, increasing arm swing    Ambulation Distance (Feet) 300 Feet    Assistive device None    Gait Pattern Step-through pattern;Decreased arm swing - right;Decreased arm swing - left;Decreased stance time - left;Right foot flat    Ambulation Surface Level;Indoor      Neuro Re-ed    Neuro Re-ed Details  Standing on blue airex; anterior and posterior stepping with one foot on airex entire time - verbal cueing for hip extension and pushing through ground on front foot 2x10 each.  2x10 mini squats.      Exercises   Other Exercises  Stepping over 6in hurdle, landing flat footed in front and shift all weight to front foot x10each, tapping with just heel in front x10each      Knee/Hip Exercises: Aerobic   Tread Mill Walking forwards 4% incline, speed 0.9 for 4 minutes; decreased stance time LLE, decreased hip ext LLE, decreased toe off RLE.  Able to increase step length bilaterally      Knee/Hip Exercises: Standing   Hip Extension Stengthening;3 sets;10 reps;Limitations    Extension Limitations BUE  support, yellow theraband at ankle.  Verbal cueing to perform just hip extension and not have any pelvic rotation with movement.               Balance Exercises - 04/28/20 1258      Balance Exercises: Standing   Rockerboard Anterior/posterior;Limitations    Rockerboard Limitations Weightshifting on rockertoward with 3 sec hold in most ant/posterior position.  Initiated with hip strategy but after verbal cueing able to complete exercises through ankle motion. 2x10             PT Education - 04/28/20 1301    Education Details Clinical findings with gait - practicing toe off.    Person(s) Educated Patient    Methods Explanation    Comprehension Verbalized understanding            PT Short Term Goals - 04/14/20 1432      PT SHORT TERM GOAL #1   Title Pt will demonstrate independence with initial balance/LE  strengthening HEP    Time 4    Period Weeks    Status Achieved    Target Date 04/16/20      PT SHORT TERM GOAL #2   Title Pt will increase 30 second sit to stand to 10 repetitions without use of UE to indicate increased functional LE strength    Baseline 5.5 reps > 11 reps    Time 4    Period Weeks    Status Achieved    Target Date 04/16/20      PT SHORT TERM GOAL #3   Title Pt will decrease time to perform TUG with RW to </= 17 seconds and without RW to </= 20 seconds to indicate decreased falls risk    Baseline 21 seconds with RW, 24 seconds without RW > 11 seconds no AD    Time 4    Period Weeks    Status Achieved    Target Date 04/16/20      PT SHORT TERM GOAL #4   Title Pt will increase BERG score by 5 points to indicate decreased risk for falls    Baseline 32/56 > 51/56    Time 4    Period Weeks    Status Achieved    Target Date 04/16/20      PT SHORT TERM GOAL #5   Title Pt will ambulate x 230' over level, indoor surfaces with L and R turns safely with supervision without RW    Time 4    Period Weeks    Status Achieved    Target Date 04/16/20             PT Long Term Goals - 04/14/20 2035      PT LONG TERM GOAL #1   Title Pt will be independent with final HEP  (ALL LTG DUE BY 05/16/20)    Time 8    Period Weeks    Status New      PT LONG TERM GOAL #2   Title Pt will report overall improvement in function on FOTO to >/= 64%    Baseline 50%    Time 8    Period Weeks    Status New      PT LONG TERM GOAL #3   Title Pt will ambulate x 500' outside over paved surfaces, negotiate curb and negotiate 8 stairs with one rail with supervision (no AD)    Time 8    Period Weeks    Status New  PT LONG TERM GOAL #4   Title Pt will increase to 15 reps for 30 second sit to stand to indicate increased functional LE strength    Time 8    Period Weeks    Status New      PT LONG TERM GOAL #5   Title Pt will decrease time to perform TUG without AD to </= 15  seconds    Baseline 11 seconds, WNL - goal met without AD    Time 8    Period Weeks    Status Achieved      PT LONG TERM GOAL #6   Title Pt will increase BERG to >/= 54/56 to indicate decreased falls risk    Baseline 51/56    Time 8    Period Weeks    Status Revised                 Plan - 04/28/20 1305    Clinical Impression Statement Focus on hip extension strengthening including functional activities to encourge greater step length and increased LLE stance time.  Treadmill training continued to encourage hip extension and step length but pt still demonstrates decreased LLE stance time.  Also addressed limitation in lack of RLE toe off.  Included rocker board activities with emphasis on ankle movement and gait training focusing on getting full extension from the toes and rocking through the ankle.  Pt displays understanding of awareness of lack of toe off during gait and is encouraged to practice at home.    Personal Factors and Comorbidities Comorbidity 3+;Profession    Comorbidities carpal tunnel, HTN, HLD, DM, gout, and former smoker    Examination-Activity Limitations Locomotion Level;Transfers;Stairs    Examination-Participation Restrictions Driving;Occupation;Community Activity    Stability/Clinical Decision Making Evolving/Moderate complexity    Rehab Potential Good    PT Frequency 2x / week    PT Duration 8 weeks    PT Treatment/Interventions ADLs/Self Care Home Management;Aquatic Therapy;Cryotherapy;Electrical Stimulation;Moist Heat;DME Instruction;Gait training;Stair training;Functional mobility training;Therapeutic activities;Therapeutic exercise;Balance training;Neuromuscular re-education;Patient/family education;Orthotic Fit/Training;Manual techniques;Passive range of motion    PT Next Visit Plan continue dynamic gait activities without AD. Working on increasing right toe off/ stance time - treadmill with incline, trampoline, etc.. Hip ext exercises.  Continue RLE  strengthening and balance training. Rockerboard activities. SLS activities.    Consulted and Agree with Plan of Care Patient;Family member/caregiver    Family Member Consulted wife           Patient will benefit from skilled therapeutic intervention in order to improve the following deficits and impairments:  Abnormal gait,Decreased balance,Decreased strength,Pain  Visit Diagnosis: No diagnosis found.     Problem List Patient Active Problem List   Diagnosis Date Noted  . Spasticity as late effect of cerebrovascular accident (CVA) 04/22/2020  . Elevated BUN   . Sleep disturbance   . Infarction of left basal ganglia ([place]) 02/21/2020  . Right hemiparesis ([place])   . Leukocytosis   . Sinus tachycardia   . Dyslipidemia   . History of gout   . Left sided lacunar infarction ([place]) 02/16/2020  . Chronic hepatitis C without hepatic coma ([place]) 01/20/2016  . Lumbar disc disease with radiculopathy 03/07/2012  . Essential hypertension 12/14/2009  . RHINITIS 03/26/2008  . Gout 01/15/2007  . ERECTILE DYSFUNCTION 01/15/2007  . CARPAL TUNNEL SYNDROME, RIGHT 01/15/2007    Yetta Numbers, SPT 04/28/2020, 1:09 PM  [facility] [address] [place], [place], [address] Phone: [phone]   Fax:  [phone]  Name: Lopez Dentinger Sr. MRN: [id number] Date of Birth: 09/06/1950

## 2020-04-28 NOTE — Therapy (Signed)
[facility] [address] [place], [place], [address] Phone: [phone]   Fax:  [phone]  Occupational Therapy Treatment and Progress Update  Patient Details  Name: Brent Gossard Sr. MRN: [id number] Date of Birth: 01-May-1950 Referring Provider (OT): Mar[place]e [place]  This progress Update covers dates of service from 03/18/20-04/28/20 Encounter Date: 04/28/2020   OT End of Session - 04/28/20 1432    Visit Number 10    Number of Visits 25    Date for OT Re-Evaluation 06/16/20    Authorization Type [facility] [facility]    Progress Note Due on Visit 10    OT Start Time 1230    OT Stop Time 1315    OT Time Calculation (min) 45 min    Activity Tolerance Patient tolerated treatment well    Behavior During Therapy WFL for tasks assessed/performed           Past Medical History:  Diagnosis Date  . Carpal tunnel syndrome   . Gout   . Hyperlipidemia    on meds  . Hypertension    on meds    Past Surgical History:  Procedure Laterality Date  . COLONOSCOPY  2015   DJ-MAC-polyps  . LOOP RECORDER INSERTION N/A 02/19/2020   Procedure: LOOP RECORDER INSERTION;  Surgeon: Constance Haw, MD;  Location: [facility];  Service: Cardiovascular;  Laterality: N/A;  . no prior surgery    . POLYPECTOMY  2015   DJ-MAC-polyps    There were no vitals filed for this visit.   Subjective Assessment - 04/28/20 1238    Subjective  I did mow with the walk behind mower, I used the power sander with two hands, and I was able to move furniture (less than 30 lbs) IN MY WORKSHOP    Currently in Pain? 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    ADL Comments Reviewed short term goals - patient has met all STG's.   Reviewed long term goals.      Neurological Re-education Exercises   Other Exercises 1 Patient with mild shoulder pain with mid to high reach.  Worked on supported reach stretches in sitting and in modified plantigrade positions.  Patient with increased tightness in anterior shoulder and mild vertebtral border winging noted  - improved with better postural control.      Splinting   Splinting Patient issued CMC neoprene brace to help with support of right thumb.  Discussed with patient that spling will help with functional positioning and support of R thumb.  Patient abel to don and doff independently after demonstration.                    OT Short Term Goals - 04/28/20 1245      OT SHORT TERM GOAL #1   Title Patient will complete a home exercise program designed to improve RUE range of motion    Time 4    Period Weeks    Status On-going   issued supine cane ex   Target Date 05/02/20      OT SHORT TERM GOAL #2   Title Patient will utilize RUE  to grasp release feeding utensil with min assist    Time 4    Period Weeks    Status Achieved      OT SHORT TERM GOAL #3   Title Patient will tie shoes bimanually with increased time and min assist    Time 4    Period Weeks    Status Achieved      OT SHORT TERM GOAL #4   Title Patient will zipper light weight jacket, or button 2 buttons on shirt with increased time and min assist    Time 4    Period Weeks    Status On-going      OT SHORT TERM GOAL #5   Title Patient will utilize BUE to wash a lightweight dish while standing at sink    Time 4    Period Weeks    Status Achieved             OT Long Term Goals - 04/28/20 1245      OT LONG TERM GOAL #1   Title Patient will complete and updated HEP to address RUE ROM and functional strength    Time 12    Period Weeks    Status On-going      OT LONG TERM GOAL #2   Title Patient will utilize BUE to weave chair seating (caning) with increased time and intermittent  support    Time 12    Period Weeks    Status On-going      OT LONG TERM GOAL #3   Title Patient will write a full sentence with RUE legibly    Time 8    Period Weeks    Status On-going      OT LONG TERM GOAL #4   Title Patient will reach into overhead cabinet to obtain a lightweight object (less than 3 lb) with RUE    Time 12    Period Weeks    Status On-going      OT LONG TERM GOAL #5   Title Patient will utilize mallet with RUE to tap to open joints of chair in furniture restoration project    Time 12    Period Weeks    Status On-going      OT LONG TERM GOAL #6   Title Patient will demonstrate understanding of recommendations relating to retunring to driving    Time 12    Period Weeks    Status Achieved                 Plan - 04/28/20 1432    Clinical Impression Statement Pt has met all of his short term goals, and is working toward long term goals.    OT Occupational Profile and History Detailed Assessment- Review of Records and additional review of physical, cognitive, psychosocial history related to current functional performance    Occupational performance deficits (Please refer to evaluation for details): ADL's;IADL's;Work    Body Structure / Function / Physical Skills ADL;Coordination;Endurance;GMC;UE functional use;Balance;Decreased knowledge of precautions;Fascial restriction;Pain;IADL;Flexibility;Decreased knowledge of use of DME;Body mechanics;Dexterity;FMC;Strength;Tone;ROM    Rehab Potential Excellent    Clinical Decision Making Several treatment options, min-mod task modification necessary    Comorbidities Affecting Occupational Performance: May have comorbidities impacting occupational performance    Modification or Assistance to Complete Evaluation  Min-Moderate modification of tasks or assist with assess necessary to complete eval    OT Frequency 2x / week    OT Duration 12 weeks    OT Treatment/Interventions Self-care/ADL training;Electrical  Stimulation;Therapeutic exercise;Aquatic Therapy;Moist Heat;Neuromuscular education;Compression bandaging;Splinting;Patient/family education;Balance training;Therapeutic activities;Functional Mobility Training;Fluidtherapy;Cryotherapy;Ultrasound;Contrast Bath;DME and/or AE instruction;Manual Therapy;Passive range of motion    Plan NMR proximal RUE - Needs scap stabilizers and improved strength/range, Functional use of RUE - for eating, inhand manip, tool use    OT Home Exercise Plan coordination HEP, putty HEP    Consulted and Agree with Plan of Care Patient           Patient will benefit from skilled therapeutic intervention in order to improve the following deficits and impairments:   Body Structure / Function / Physical Skills: ADL,Coordination,Endurance,GMC,UE functional use,Balance,Decreased knowledge of precautions,Fascial restriction,Pain,IADL,Flexibility,Decreased knowledge of use of DME,Body mechanics,Dexterity,FMC,Strength,Tone,ROM       Visit Diagnosis: Hemiplegia and hemiparesis following cerebral infarction affecting right dominant side (HCC)  Muscle weakness (generalized)  Pain in right wrist  Unsteadiness on feet    Problem List Patient Active Problem List   Diagnosis Date Noted  . Spasticity as late effect of cerebrovascular accident (CVA) 04/22/2020  . Elevated BUN   . Sleep disturbance   . Infarction of left basal ganglia ([place]) 02/21/2020  . Right hemiparesis ([place])   . Leukocytosis   . Sinus tachycardia   . Dyslipidemia   . History of gout   . Left sided lacunar infarction ([place]) 02/16/2020  . Chronic hepatitis C without hepatic coma ([place]) 01/20/2016  . Lumbar disc disease with radiculopathy 03/07/2012  . Essential hypertension 12/14/2009  . RHINITIS 03/26/2008  . Gout 01/15/2007  . ERECTILE DYSFUNCTION 01/15/2007  . CARPAL TUNNEL SYNDROME, RIGHT 01/15/2007    Mariah Milling, OTR/L 04/28/2020, 2:33 PM  [facility] [address] [place], [place], [address] Phone: [phone]   Fax:  [phone]  Name: Erin Uecker Sr. MRN: [id number] Date of Birth: 02-25-1950

## 2020-04-29 ENCOUNTER — Encounter: Payer: Self-pay | Admitting: Adult Health

## 2020-04-29 ENCOUNTER — Other Ambulatory Visit (HOSPITAL_COMMUNITY): Payer: Self-pay

## 2020-04-29 ENCOUNTER — Ambulatory Visit: Payer: 59

## 2020-04-29 ENCOUNTER — Other Ambulatory Visit: Payer: Self-pay | Admitting: Adult Health

## 2020-04-29 ENCOUNTER — Ambulatory Visit: Payer: 59 | Admitting: Occupational Therapy

## 2020-04-29 MED ORDER — CLOPIDOGREL BISULFATE 75 MG PO TABS
ORAL_TABLET | Freq: Every day | ORAL | 3 refills | Status: DC
  Filled 2020-04-29: qty 90, 90d supply, fill #0
  Filled 2020-07-28: qty 90, 90d supply, fill #1
  Filled 2020-10-23: qty 90, 90d supply, fill #2
  Filled 2021-01-22: qty 90, 90d supply, fill #3

## 2020-04-29 MED ORDER — PANTOPRAZOLE SODIUM 40 MG PO TBEC
DELAYED_RELEASE_TABLET | Freq: Every day | ORAL | 1 refills | Status: DC
  Filled 2020-04-29: qty 90, 90d supply, fill #0
  Filled 2020-08-06: qty 90, 90d supply, fill #1

## 2020-04-30 ENCOUNTER — Other Ambulatory Visit (HOSPITAL_COMMUNITY): Payer: Self-pay

## 2020-04-30 ENCOUNTER — Encounter: Payer: 59 | Admitting: Psychology

## 2020-05-02 ENCOUNTER — Other Ambulatory Visit (HOSPITAL_COMMUNITY): Payer: Self-pay

## 2020-05-04 ENCOUNTER — Other Ambulatory Visit (HOSPITAL_COMMUNITY): Payer: Self-pay

## 2020-05-04 ENCOUNTER — Ambulatory Visit (INDEPENDENT_AMBULATORY_CARE_PROVIDER_SITE_OTHER): Payer: 59

## 2020-05-04 DIAGNOSIS — I639 Cerebral infarction, unspecified: Secondary | ICD-10-CM

## 2020-05-05 ENCOUNTER — Other Ambulatory Visit: Payer: Self-pay

## 2020-05-05 ENCOUNTER — Encounter: Payer: Self-pay | Admitting: Occupational Therapy

## 2020-05-05 ENCOUNTER — Ambulatory Visit: Payer: 59 | Admitting: Occupational Therapy

## 2020-05-05 DIAGNOSIS — R2689 Other abnormalities of gait and mobility: Secondary | ICD-10-CM | POA: Diagnosis not present

## 2020-05-05 DIAGNOSIS — M6281 Muscle weakness (generalized): Secondary | ICD-10-CM | POA: Diagnosis not present

## 2020-05-05 DIAGNOSIS — R2681 Unsteadiness on feet: Secondary | ICD-10-CM | POA: Diagnosis not present

## 2020-05-05 DIAGNOSIS — I69351 Hemiplegia and hemiparesis following cerebral infarction affecting right dominant side: Secondary | ICD-10-CM | POA: Diagnosis not present

## 2020-05-05 DIAGNOSIS — M25531 Pain in right wrist: Secondary | ICD-10-CM | POA: Diagnosis not present

## 2020-05-05 DIAGNOSIS — R600 Localized edema: Secondary | ICD-10-CM | POA: Diagnosis not present

## 2020-05-05 NOTE — Therapy (Signed)
[facility] [address] [place], [place], [address] Phone: [phone]   Fax:  [phone]  Occupational Therapy Treatment  Patient Details  Name: Brent Redner Sr. MRN: [id number] Date of Birth: Jun 21, 1950 Referring Provider (OT): Marlowe Shores   Encounter Date: 05/05/2020   OT End of Session - 05/05/20 1335    Visit Number 11    Number of Visits 25    Date for OT Re-Evaluation 06/16/20    Authorization Type [facility]    Progress Note Due on Visit 11    OT Start Time 1230    OT Stop Time 1315    OT Time Calculation (min) 45 min    Activity Tolerance Patient tolerated treatment well    Behavior During Therapy [facility] for tasks assessed/performed           Past Medical History:  Diagnosis Date  . Carpal tunnel syndrome   . Gout   . Hyperlipidemia    on meds  . Hypertension    on meds    Past Surgical History:  Procedure Laterality Date  . COLONOSCOPY  2015   DJ-MAC-polyps  . LOOP RECORDER INSERTION N/A 02/19/2020   Procedure: LOOP RECORDER INSERTION;  Surgeon: Constance Haw, MD;  Location: [facility];  Service: Cardiovascular;  Laterality: N/A;  . no prior surgery    . POLYPECTOMY  2015   DJ-MAC-polyps    There were no vitals filed for this visit.   Subjective Assessment - 05/05/20 1237    Subjective  My shoulder is sore - I think I slept on it funny    Currently in Pain? Yes    Pain Score 3     Pain Location Shoulder    Pain Orientation Right    Pain Descriptors / Indicators Aching;Stabbing    Pain Type Chronic pain    Pain Onset In the past 7 days    Pain Frequency Intermittent                        OT Treatments/Exercises (OP) - 05/05/20 0001      ADLs   ADL Comments Reviewed positioning for sleep to help shoulder pain      Modalities   Modalities Moist Heat      Moist Heat Therapy   Number Minutes Moist Heat 12 Minutes    Moist Heat Location Shoulder       Manual Therapy   Manual Therapy Passive ROM;Joint mobilization;Scapular mobilization    Joint Mobilization Very gentle distraction where humerus pulling up over socket.  Gentle mobilization to digits to encourage extension and isolated motion.    Scapular Mobilization Gentle scapular motion, gentle range of motion - internal and external rotation, shoulder flexion and abduction                  OT Education - 05/05/20 1334    Education Details reviewed sleep positions to reduce shoulder/ arm pain    Person(s) Educated Patient    Methods Explanation;Demonstration;Verbal cues    Comprehension Verbalized understanding;Returned demonstration            OT Short Term Goals - 05/05/20 1338      OT SHORT TERM GOAL #1   Title Patient will complete a home exercise program designed to improve RUE range of motion    Period Weeks    Status Achieved    Target Date 05/02/20      OT SHORT  TERM GOAL #2   Title Patient will utilize RUE to grasp release feeding utensil with min assist    Time 4    Period Weeks    Status Achieved      OT SHORT TERM GOAL #3   Title Patient will tie shoes bimanually with increased time and min assist    Time 4    Period Weeks    Status Achieved      OT SHORT TERM GOAL #4   Title Patient will zipper light weight jacket, or button 2 buttons on shirt with increased time and min assist    Time 4    Period Weeks    Status Achieved      OT SHORT TERM GOAL #5   Title Patient will utilize BUE to wash a lightweight dish while standing at sink    Time 4    Period Weeks    Status Achieved             OT Long Term Goals - 05/05/20 1338      OT LONG TERM GOAL #1   Title Patient will complete and updated HEP to address RUE ROM and functional strength    Time 12    Period Weeks    Status On-going      OT LONG TERM GOAL #2   Title Patient will utilize BUE to weave chair seating (caning) with increased time and intermittent support    Time 12     Period Weeks    Status On-going      OT LONG TERM GOAL #3   Title Patient will write a full sentence with RUE legibly    Time 8    Period Weeks    Status On-going      OT LONG TERM GOAL #4   Title Patient will reach into overhead cabinet to obtain a lightweight object (less than 3 lb) with RUE    Time 12    Period Weeks    Status On-going      OT LONG TERM GOAL #5   Title Patient will utilize mallet with RUE to tap to open joints of chair in furniture restoration project    Time 12    Period Weeks    Status On-going      OT LONG TERM GOAL #6   Title Patient will demonstrate understanding of recommendations relating to retunring to driving    Time 12    Period Weeks    Status Achieved                 Plan - 05/05/20 1335    Clinical Impression Statement Patient reports arm falling off bed abruptly when he went to bed.  Noticed immediate pain since Friday.  Discussed possible aquatic therapy to addree RUE movement and balance    OT Occupational Profile and History Detailed Assessment- Review of Records and additional review of physical, cognitive, psychosocial history related to current functional performance    Occupational performance deficits (Please refer to evaluation for details): ADL's;IADL's;Work    Body Structure / Function / Physical Skills ADL;Coordination;Endurance;GMC;UE functional use;Balance;Decreased knowledge of precautions;Fascial restriction;Pain;IADL;Flexibility;Decreased knowledge of use of DME;Body mechanics;Dexterity;FMC;Strength;Tone;ROM    Rehab Potential Excellent    Clinical Decision Making Several treatment options, min-mod task modification necessary    Comorbidities Affecting Occupational Performance: May have comorbidities impacting occupational performance    Modification or Assistance to Complete Evaluation  Min-Moderate modification of tasks or assist with assess necessary to complete eval  OT Frequency 2x / week    OT Duration 12 weeks     OT Treatment/Interventions Self-care/ADL training;Electrical Stimulation;Therapeutic exercise;Aquatic Therapy;Moist Heat;Neuromuscular education;Compression bandaging;Splinting;Patient/family education;Balance training;Therapeutic activities;Functional Mobility Training;Fluidtherapy;Cryotherapy;Ultrasound;Contrast Bath;DME and/or AE instruction;Manual Therapy;Passive range of motion    Plan ASSESS SHOULDER PAIN, Discuss aquatics availabillity; NMR proximal RUE - Needs scap stabilizers and improved strength/range, Functional use of RUE - for eating, inhand manip, tool use    OT Home Exercise Plan coordination HEP, putty HEP, standing rocking for shoulder ranges    Consulted and Agree with Plan of Care Patient           Patient will benefit from skilled therapeutic intervention in order to improve the following deficits and impairments:   Body Structure / Function / Physical Skills: ADL,Coordination,Endurance,GMC,UE functional use,Balance,Decreased knowledge of precautions,Fascial restriction,Pain,IADL,Flexibility,Decreased knowledge of use of DME,Body mechanics,Dexterity,FMC,Strength,Tone,ROM       Visit Diagnosis: Hemiplegia and hemiparesis following cerebral infarction affecting right dominant side (HCC)  Muscle weakness (generalized)  Unsteadiness on feet    Problem List Patient Active Problem List   Diagnosis Date Noted  . Spasticity as late effect of cerebrovascular accident (CVA) 04/22/2020  . Elevated BUN   . Sleep disturbance   . Infarction of left basal ganglia ([place]) 02/21/2020  . Right hemiparesis ([place])   . Leukocytosis   . Sinus tachycardia   . Dyslipidemia   . History of gout   . Left sided lacunar infarction ([place]) 02/16/2020  . Chronic hepatitis C without hepatic coma ([place]) 01/20/2016  . Lumbar disc disease with radiculopathy 03/07/2012  . Essential hypertension 12/14/2009  . RHINITIS 03/26/2008  . Gout 01/15/2007  . ERECTILE DYSFUNCTION 01/15/2007  . CARPAL  TUNNEL SYNDROME, RIGHT 01/15/2007    Mariah Milling, OTR/L 05/05/2020, 1:39 PM  [facility] [address] [place], [place], [address] Phone: [phone]   Fax:  [phone]  Name: Brent Printy Sr. MRN: [id number] Date of Birth: [birth year]

## 2020-05-06 ENCOUNTER — Telehealth: Payer: 59 | Admitting: Adult Health

## 2020-05-06 LAB — CUP PACEART REMOTE DEVICE CHECK
Date Time Interrogation Session: 20220415091307
Implantable Pulse Generator Implant Date: 20220202

## 2020-05-07 ENCOUNTER — Other Ambulatory Visit: Payer: Self-pay

## 2020-05-07 ENCOUNTER — Ambulatory Visit: Payer: 59 | Admitting: Occupational Therapy

## 2020-05-07 DIAGNOSIS — I69351 Hemiplegia and hemiparesis following cerebral infarction affecting right dominant side: Secondary | ICD-10-CM

## 2020-05-07 DIAGNOSIS — R600 Localized edema: Secondary | ICD-10-CM

## 2020-05-07 DIAGNOSIS — R2681 Unsteadiness on feet: Secondary | ICD-10-CM

## 2020-05-07 DIAGNOSIS — M25531 Pain in right wrist: Secondary | ICD-10-CM

## 2020-05-07 DIAGNOSIS — R2689 Other abnormalities of gait and mobility: Secondary | ICD-10-CM | POA: Diagnosis not present

## 2020-05-07 DIAGNOSIS — M6281 Muscle weakness (generalized): Secondary | ICD-10-CM | POA: Diagnosis not present

## 2020-05-07 NOTE — Therapy (Signed)
[facility] [address] [place], [place], [address] Phone: [phone]   Fax:  [phone]  Occupational Therapy Treatment  Patient Details  Name: Brent Lightner Sr. MRN: [id number] Date of Birth: 01-27-1950 Referring Provider (OT): Marlowe Shores   Encounter Date: 05/07/2020   OT End of Session - 05/07/20 1403    Visit Number 12    Number of Visits 25    Date for OT Re-Evaluation 06/16/20    Authorization Type [facility]    OT Start Time 1230    OT Stop Time 1315    OT Time Calculation (min) 45 min    Activity Tolerance Patient tolerated treatment well    Behavior During Therapy [facility] for tasks assessed/performed           Past Medical History:  Diagnosis Date  . Carpal tunnel syndrome   . Gout   . Hyperlipidemia    on meds  . Hypertension    on meds    Past Surgical History:  Procedure Laterality Date  . COLONOSCOPY  2015   DJ-MAC-polyps  . LOOP RECORDER INSERTION N/A 02/19/2020   Procedure: LOOP RECORDER INSERTION;  Surgeon: Constance Haw, MD;  Location: [facility];  Service: Cardiovascular;  Laterality: N/A;  . no prior surgery    . POLYPECTOMY  2015   DJ-MAC-polyps    There were no vitals filed for this visit.       [facility] OT Assessment - 05/07/20 0001      Hand Function   Right Hand Grip (lbs) 13    Left Hand Grip (lbs) 45                    OT Treatments/Exercises (OP) - 05/07/20 0001      ADLs   ADL Comments Reviewed information relating to aquatic therapy program      Neurological Re-education Exercises   Reciprocal Movements with end range elbow extension    Other Exercises 1 Working on active assited mid reach patternin gradually increasing degrees of shoulder flexion/ abduction/adduction and elbow extension.  Patient with consistent mild discomfort      Modalities   Modalities Ultrasound      Ultrasound   Ultrasound Location anterior humerus     Ultrasound Parameters 46mz, 20%, 0.8 w/cm2 x 5 min    Ultrasound Goals Edema;Pain      Manual Therapy   Manual Therapy Soft tissue mobilization    Soft tissue mobilization Belly of bicep                    OT Short Term Goals - 05/07/20 1404      OT SHORT TERM GOAL #1   Title Patient will complete a home exercise program designed to improve RUE range of motion    Period Weeks    Status Achieved    Target Date 05/02/20      OT SHORT TERM GOAL #2   Title Patient will utilize RUE to grasp release feeding utensil with min assist    Time 4    Period Weeks    Status Achieved      OT SHORT TERM GOAL #3   Title Patient will tie shoes bimanually with increased time and min assist    Time 4    Period Weeks    Status Achieved      OT SHORT TERM GOAL #4   Title Patient will zipper light weight jacket, or button  2 buttons on shirt with increased time and min assist    Time 4    Period Weeks    Status Achieved      OT SHORT TERM GOAL #5   Title Patient will utilize BUE to wash a lightweight dish while standing at sink    Time 4    Period Weeks    Status Achieved             OT Long Term Goals - 05/07/20 1404      OT LONG TERM GOAL #1   Title Patient will complete and updated HEP to address RUE ROM and functional strength    Time 12    Period Weeks    Status On-going      OT LONG TERM GOAL #2   Title Patient will utilize BUE to weave chair seating (caning) with increased time and intermittent support    Time 12    Period Weeks    Status On-going      OT LONG TERM GOAL #3   Title Patient will write a full sentence with RUE legibly    Time 8    Period Weeks    Status On-going      OT LONG TERM GOAL #4   Title Patient will reach into overhead cabinet to obtain a lightweight object (less than 3 lb) with RUE    Time 12    Period Weeks    Status On-going      OT LONG TERM GOAL #5   Title Patient will utilize mallet with RUE to tap to open joints of chair  in furniture restoration project    Time 12    Period Weeks    Status On-going      OT LONG TERM GOAL #6   Title Patient will demonstrate understanding of recommendations relating to retunring to driving    Time 12    Period Weeks    Status Achieved                 Plan - 05/07/20 1403    Clinical Impression Statement Patient reports arm pain lessening    OT Occupational Profile and History Detailed Assessment- Review of Records and additional review of physical, cognitive, psychosocial history related to current functional performance    Occupational performance deficits (Please refer to evaluation for details): ADL's;IADL's;Work    Body Structure / Function / Physical Skills ADL;Coordination;Endurance;GMC;UE functional use;Balance;Decreased knowledge of precautions;Fascial restriction;Pain;IADL;Flexibility;Decreased knowledge of use of DME;Body mechanics;Dexterity;FMC;Strength;Tone;ROM    Rehab Potential Excellent    Clinical Decision Making Several treatment options, min-mod task modification necessary    Comorbidities Affecting Occupational Performance: May have comorbidities impacting occupational performance    Modification or Assistance to Complete Evaluation  Min-Moderate modification of tasks or assist with assess necessary to complete eval    OT Frequency 2x / week    OT Duration 12 weeks    OT Treatment/Interventions Self-care/ADL training;Electrical Stimulation;Therapeutic exercise;Aquatic Therapy;Moist Heat;Neuromuscular education;Compression bandaging;Splinting;Patient/family education;Balance training;Therapeutic activities;Functional Mobility Training;Fluidtherapy;Cryotherapy;Ultrasound;Contrast Bath;DME and/or AE instruction;Manual Therapy;Passive range of motion    Plan ASSESS SHOULDER PAIN, Discuss aquatics availabillity; NMR proximal RUE - Needs scap stabilizers and improved strength/range, Functional use of RUE - for eating, inhand manip, tool use    OT Home  Exercise Plan coordination HEP, putty HEP, standing rocking for shoulder ranges    Consulted and Agree with Plan of Care Patient           Patient will benefit from skilled therapeutic intervention  in order to improve the following deficits and impairments:   Body Structure / Function / Physical Skills: ADL,Coordination,Endurance,GMC,UE functional use,Balance,Decreased knowledge of precautions,Fascial restriction,Pain,IADL,Flexibility,Decreased knowledge of use of DME,Body mechanics,Dexterity,FMC,Strength,Tone,ROM       Visit Diagnosis: Hemiplegia and hemiparesis following cerebral infarction affecting right dominant side (HCC)  Muscle weakness (generalized)  Unsteadiness on feet  Pain in right wrist  Localized edema    Problem List Patient Active Problem List   Diagnosis Date Noted  . Spasticity as late effect of cerebrovascular accident (CVA) 04/22/2020  . Elevated BUN   . Sleep disturbance   . Infarction of left basal ganglia ([place]) 02/21/2020  . Right hemiparesis ([place])   . Leukocytosis   . Sinus tachycardia   . Dyslipidemia   . History of gout   . Left sided lacunar infarction ([place]) 02/16/2020  . Chronic hepatitis C without hepatic coma ([place]) 01/20/2016  . Lumbar disc disease with radiculopathy 03/07/2012  . Essential hypertension 12/14/2009  . RHINITIS 03/26/2008  . Gout 01/15/2007  . ERECTILE DYSFUNCTION 01/15/2007  . CARPAL TUNNEL SYNDROME, RIGHT 01/15/2007    Mariah Milling , OTR/L 05/07/2020, 2:04 PM  [facility] [address] [place], [place], [address] Phone: [phone]   Fax:  [phone]  Name: Brent Keithly Sr. MRN: [id number] Date of Birth: 06-Oct-1950

## 2020-05-07 NOTE — Patient Instructions (Signed)
   Aquatic Therapy: What to Expect!  Where:  [facility] [address] [place], [place] [address] [phone]           How to Prepare: . Please make sure you drink 8 ounces of water about one hour prior to your pool session . A caregiver must attend the entire session with the patient (unless your primary therapists feels this is not necessary). The caregiver will be responsible for assisting with dressing as well as any toileting needs.  . Please arrive IN YOUR SUIT and a few minutes prior to your appointment - this helps to avoid delays in starting your session. . Please make sure to attend to any toileting needs prior to entering the pool . Once on the pool deck your therapist will ask you to sign the Patient  Consent and Assignment of Benefits form . Your therapist may take your blood pressure prior to, during and after your session if indicated . We usually try and create a home exercise program based on activities we do in the pool.  Please be thinking about who might be able to assist you in the pool should you want to participate in an aquatic home exercise program at the time of discharge.  Some patients do not want to or do not have the ability to participate in an aquatic home program - this is not a barrier in any way to you participating in aquatic therapy as part of your current therapy plan!    About the pool: 1. Entering the pool Your therapist will assist you; there are multiple ways to enter including stairs with railings, a walk in ramp, a roll in chair and a mechanical lift. Your therapist will determine the most appropriate way for you. 2. Water temperature is usually between 86-87 degrees 3. There may be other swimmers in the pool at the same time     Contact Info:             Appointments: [facility]         All sessions are 45 minutes   [address]            Please call the [facility] if   [place], [place]  [address]           you need to cancel or reschedule an appointment.  [phone]   Cell Phone  [place] [address] [phone]          [place], OTR/L    07/10/19

## 2020-05-11 ENCOUNTER — Encounter: Payer: Self-pay | Admitting: Occupational Therapy

## 2020-05-11 ENCOUNTER — Ambulatory Visit: Payer: 59 | Admitting: Occupational Therapy

## 2020-05-11 NOTE — Therapy (Signed)
[facility] [address] [place], [place], [address] Phone: [phone]   Fax:  [phone]  Occupational Therapy Treatment  Patient Details  Name: Brent Budai Sr. MRN: [id number] Date of Birth: 1950-12-16 Referring Provider (OT): Marlowe Shores   Encounter Date: 05/11/2020   OT End of Session - 05/11/20 1745    Visit Number 13    Number of Visits 25    Date for OT Re-Evaluation 06/16/20    Authorization Type [facility]    OT Start Time 5284    OT Stop Time 1500    OT Time Calculation (min) 57 min           Past Medical History:  Diagnosis Date  . Carpal tunnel syndrome   . Gout   . Hyperlipidemia    on meds  . Hypertension    on meds    Past Surgical History:  Procedure Laterality Date  . COLONOSCOPY  2015   DJ-MAC-polyps  . LOOP RECORDER INSERTION N/A 02/19/2020   Procedure: LOOP RECORDER INSERTION;  Surgeon: Constance Haw, MD;  Location: [facility];  Service: Cardiovascular;  Laterality: N/A;  . no prior surgery    . POLYPECTOMY  2015   DJ-MAC-polyps    There were no vitals filed for this visit.   Subjective Assessment - 05/11/20 1741    Subjective  It only hurts when I lift it (shoulder)    Currently in Pain? No/denies    Pain Score 0-No pain          Patient seen for first aquatic therapy visit.  Patient entered and exited the water with min assist and hand rails.  Treatment occurred in 3.5-4 feet of water.  Initially walked in water with emphasis on midline orientation, step length, and gentle arm swing.  Supine with floatation equipmet to address shoulder range of motion via arm on body and body on arm motion.  Patient actually able to swim on his back using BUE with floatation belt.   Seated on underwater bench, worked on allowing RUE to float to surface versus use of over activation of deltoid/upper trap.  Patient able to bring arm to 90 degrees of flexion / abduction  consistently without pain.  Followed with Ai Chi exercise against the wall initially, then off the wall for greater challenge to balance.  Patient with improved active range without pain at end of this session.                          OT Short Term Goals - 05/11/20 1744      OT SHORT TERM GOAL #1   Title Patient will complete a home exercise program designed to improve RUE range of motion    Period Weeks    Status Achieved    Target Date 05/02/20      OT SHORT TERM GOAL #2   Title Patient will utilize RUE to grasp release feeding utensil with min assist    Time 4    Period Weeks    Status Achieved      OT SHORT TERM GOAL #3   Title Patient will tie shoes bimanually with increased time and min assist    Time 4    Period Weeks    Status Achieved      OT SHORT TERM GOAL #4   Title Patient will zipper light weight jacket, or button 2 buttons on shirt with increased time  and min assist    Time 4    Period Weeks    Status Achieved      OT SHORT TERM GOAL #5   Title Patient will utilize BUE to wash a lightweight dish while standing at sink    Time 4    Period Weeks    Status Achieved             OT Long Term Goals - 05/11/20 1744      OT LONG TERM GOAL #1   Title Patient will complete and updated HEP to address RUE ROM and functional strength    Time 12    Period Weeks    Status On-going      OT LONG TERM GOAL #2   Title Patient will utilize BUE to weave chair seating (caning) with increased time and intermittent support    Time 12    Period Weeks    Status On-going      OT LONG TERM GOAL #3   Title Patient will write a full sentence with RUE legibly    Time 8    Period Weeks    Status On-going      OT LONG TERM GOAL #4   Title Patient will reach into overhead cabinet to obtain a lightweight object (less than 3 lb) with RUE    Time 12    Period Weeks    Status On-going      OT LONG TERM GOAL #5   Title Patient will utilize mallet with  RUE to tap to open joints of chair in furniture restoration project    Time 12    Period Weeks    Status On-going      OT LONG TERM GOAL #6   Title Patient will demonstrate understanding of recommendations relating to retunring to driving    Time 12    Period Weeks    Status Achieved                 Plan - 05/11/20 1743    Clinical Impression Statement Patient reports arm pain lessening, and functional use of his arm continues to improve - mowed the lawn, 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    OT Occupational Profile and History Detailed Assessment- Review of Records and additional review of physical, cognitive, psychosocial history related to current functional performance    Occupational performance deficits (Please refer to evaluation for details): ADL's;IADL's;Work    Body Structure / Function / Physical Skills ADL;Coordination;Endurance;GMC;UE functional use;Balance;Decreased knowledge of precautions;Fascial restriction;Pain;IADL;Flexibility;Decreased knowledge of use of DME;Body mechanics;Dexterity;FMC;Strength;Tone;ROM    Rehab Potential Excellent    Clinical Decision Making Several treatment options, min-mod task modification necessary    Comorbidities Affecting Occupational Performance: May have comorbidities impacting occupational performance    Modification or Assistance to Complete Evaluation  Min-Moderate modification of tasks or assist with assess necessary to complete eval    OT Frequency 2x / week    OT Duration 12 weeks    OT Treatment/Interventions Self-care/ADL training;Electrical Stimulation;Therapeutic exercise;Aquatic Therapy;Moist Heat;Neuromuscular education;Compression bandaging;Splinting;Patient/family education;Balance training;Therapeutic activities;Functional Mobility Training;Fluidtherapy;Cryotherapy;Ultrasound;Contrast Bath;DME and/or AE instruction;Manual Therapy;Passive range of motion    Plan ASSESS SHOULDER PAIN, Discuss aquatics availabillity;  NMR proximal RUE - Needs scap stabilizers and improved strength/range, Functional use of RUE - for eating, inhand manip, tool use    OT Home Exercise Plan coordination HEP, putty HEP, standing rocking for shoulder ranges    Consulted and Agree with Plan of Care Patient  Patient will benefit from skilled therapeutic intervention in order to improve the following deficits and impairments:   Body Structure / Function / Physical Skills: ADL,Coordination,Endurance,GMC,UE functional use,Balance,Decreased knowledge of precautions,Fascial restriction,Pain,IADL,Flexibility,Decreased knowledge of use of DME,Body mechanics,Dexterity,FMC,Strength,Tone,ROM       Visit Diagnosis: Hemiplegia and hemiparesis following cerebral infarction affecting right dominant side (HCC)  Muscle weakness (generalized)  Unsteadiness on feet  Pain in right wrist  Localized edema    Problem List Patient Active Problem List   Diagnosis Date Noted  . Spasticity as late effect of cerebrovascular accident (CVA) 04/22/2020  . Elevated BUN   . Sleep disturbance   . Infarction of left basal ganglia ([place]) 02/21/2020  . Right hemiparesis ([place])   . Leukocytosis   . Sinus tachycardia   . Dyslipidemia   . History of gout   . Left sided lacunar infarction ([place]) 02/16/2020  . Chronic hepatitis C without hepatic coma ([place]) 01/20/2016  . Lumbar disc disease with radiculopathy 03/07/2012  . Essential hypertension 12/14/2009  . RHINITIS 03/26/2008  . Gout 01/15/2007  . ERECTILE DYSFUNCTION 01/15/2007  . CARPAL TUNNEL SYNDROME, RIGHT 01/15/2007    Mariah Milling 05/11/2020, 5:46 PM  [facility] [address] [place], [place], [address] Phone: [phone]   Fax:  [phone]  Name: Brent Cosma Sr. MRN: [id number] Date of Birth: [birth year]

## 2020-05-12 ENCOUNTER — Ambulatory Visit: Payer: 59 | Admitting: Physical Therapy

## 2020-05-12 ENCOUNTER — Ambulatory Visit: Payer: 59 | Admitting: Occupational Therapy

## 2020-05-14 ENCOUNTER — Encounter: Payer: Self-pay | Admitting: Occupational Therapy

## 2020-05-14 ENCOUNTER — Other Ambulatory Visit: Payer: Self-pay

## 2020-05-14 ENCOUNTER — Ambulatory Visit: Payer: 59 | Admitting: Physical Therapy

## 2020-05-14 ENCOUNTER — Ambulatory Visit: Payer: 59 | Admitting: Occupational Therapy

## 2020-05-14 DIAGNOSIS — M6281 Muscle weakness (generalized): Secondary | ICD-10-CM

## 2020-05-14 DIAGNOSIS — R2681 Unsteadiness on feet: Secondary | ICD-10-CM

## 2020-05-14 DIAGNOSIS — R2689 Other abnormalities of gait and mobility: Secondary | ICD-10-CM | POA: Diagnosis not present

## 2020-05-14 DIAGNOSIS — I69351 Hemiplegia and hemiparesis following cerebral infarction affecting right dominant side: Secondary | ICD-10-CM

## 2020-05-14 DIAGNOSIS — R600 Localized edema: Secondary | ICD-10-CM | POA: Diagnosis not present

## 2020-05-14 DIAGNOSIS — M25531 Pain in right wrist: Secondary | ICD-10-CM

## 2020-05-14 NOTE — Therapy (Signed)
[facility] [address] [place], [place], [address] Phone: [phone]   Fax:  [phone]  Occupational Therapy Treatment  Patient Details  Name: Brent Blackburn Sr. MRN: [id number] Date of Birth: 07/07/1950 Referring Provider (OT): Marlowe Shores   Encounter Date: 05/14/2020   OT End of Session - 05/14/20 1326    Visit Number 14    Number of Visits 25    Date for OT Re-Evaluation 06/16/20    Authorization Type [facility]    Authorization Time Period Week 8 of 12 (4/28)    OT Start Time 1325   patient not checked in on time   OT Stop Time 1403    OT Time Calculation (min) 38 min    Activity Tolerance Patient tolerated treatment well    Behavior During Therapy [facility] for tasks assessed/performed           Past Medical History:  Diagnosis Date  . Carpal tunnel syndrome   . Gout   . Hyperlipidemia    on meds  . Hypertension    on meds    Past Surgical History:  Procedure Laterality Date  . COLONOSCOPY  2015   DJ-MAC-polyps  . LOOP RECORDER INSERTION N/A 02/19/2020   Procedure: LOOP RECORDER INSERTION;  Surgeon: Constance Haw, MD;  Location: [facility];  Service: Cardiovascular;  Laterality: N/A;  . no prior surgery    . POLYPECTOMY  2015   DJ-MAC-polyps    There were no vitals filed for this visit.   Subjective Assessment - 05/14/20 1326    Subjective  It only hurts when I lift it (shoulder)    Currently in Pain? Yes    Pain Score 2     Pain Location Shoulder    Pain Orientation Right    Pain Descriptors / Indicators Dull    Pain Type Chronic pain    Pain Onset In the past 7 days    Pain Frequency Intermittent    Aggravating Factors  when lifting hurts worse          Checked goals. See below.  shoulder range of motion with rocking on counter and with wall slides with foam roller x 10   Nuts and bolts - with increased time req'd but no drops and min difficulty   In Hand  Manipulation w golf balls with max difficulty and 5 revolutions and stacking and sliding pennies to fingertips. Max difficulty with in hand manipulation with moderate drops.                    OT Short Term Goals - 05/11/20 1744      OT SHORT TERM GOAL #1   Title Patient will complete a home exercise program designed to improve RUE range of motion    Period Weeks    Status Achieved    Target Date 05/02/20      OT SHORT TERM GOAL #2   Title Patient will utilize RUE to grasp release feeding utensil with min assist    Time 4    Period Weeks    Status Achieved      OT SHORT TERM GOAL #3   Title Patient will tie shoes bimanually with increased time and min assist    Time 4    Period Weeks    Status Achieved      OT SHORT TERM GOAL #4   Title Patient will zipper light weight jacket, or button 2 buttons on  shirt with increased time and min assist    Time 4    Period Weeks    Status Achieved      OT SHORT TERM GOAL #5   Title Patient will utilize BUE to wash a lightweight dish while standing at sink    Time 4    Period Weeks    Status Achieved             OT Long Term Goals - 05/14/20 1330      OT LONG TERM GOAL #1   Title Patient will complete and updated HEP to address RUE ROM and functional strength    Time 12    Period Weeks    Status On-going   continue to add PRN     OT LONG TERM GOAL #2   Title Patient will utilize BUE to weave chair seating (caning) with increased time and intermittent support    Time 12    Period Weeks    Status On-going   has not tried yet - will continue to work towards coordination with [place] #3   Title Patient will write a full sentence with RUE legibly    Time 8    Period Weeks    Status Achieved   pt reports it is better and wrote a sentence in clinic with 100% legibility     OT LONG TERM GOAL #4   Title Patient will reach into overhead cabinet to obtain a lightweight object (less than 3 lb) with  RUE    Time 12    Period Weeks    Status On-going   pt with increased pain in RUE shoulder     OT LONG TERM GOAL #5   Title Patient will utilize mallet with RUE to tap to open joints of chair in furniture restoration project    Time 12    Period Weeks    Status On-going   still working towards increasing strength for using mallet     OT LONG TERM GOAL #6   Title Patient will demonstrate understanding of recommendations relating to retunring to driving    Time 12    Period Weeks    Status Achieved      OT LONG TERM GOAL #7   Title --    Baseline --    Time --    Period --    Status --                 Plan - 05/14/20 1339    Clinical Impression Statement Pt having increased pain in RUE shoulder impeding overall functional use.    OT Occupational Profile and History Detailed Assessment- Review of Records and additional review of physical, cognitive, psychosocial history related to current functional performance    Occupational performance deficits (Please refer to evaluation for details): ADL's;IADL's;Work    Body Structure / Function / Physical Skills ADL;Coordination;Endurance;GMC;UE functional use;Balance;Decreased knowledge of precautions;Fascial restriction;Pain;IADL;Flexibility;Decreased knowledge of use of DME;Body mechanics;Dexterity;FMC;Strength;Tone;ROM    Rehab Potential Excellent    Clinical Decision Making Several treatment options, min-mod task modification necessary    Comorbidities Affecting Occupational Performance: May have comorbidities impacting occupational performance    Modification or Assistance to Complete Evaluation  Min-Moderate modification of tasks or assist with assess necessary to complete eval    OT Frequency 2x / week    OT Duration 12 weeks    OT Treatment/Interventions Self-care/ADL training;Electrical Stimulation;Therapeutic exercise;Aquatic Therapy;Moist Heat;Neuromuscular education;Compression bandaging;Splinting;Patient/family  education;Balance training;Therapeutic activities;Functional Mobility Training;Fluidtherapy;Cryotherapy;Ultrasound;Contrast Bath;DME and/or AE instruction;Manual Therapy;Passive range of motion    Plan ASSESS SHOULDER PAIN, Discuss aquatics availabillity; NMR proximal RUE - Needs scap stabilizers and improved strength/range, Functional use of RUE - for eating, inhand manip, tool use    OT Home Exercise Plan coordination HEP, putty HEP, standing rocking for shoulder ranges    Consulted and Agree with Plan of Care Patient           Patient will benefit from skilled therapeutic intervention in order to improve the following deficits and impairments:   Body Structure / Function / Physical Skills: ADL,Coordination,Endurance,GMC,UE functional use,Balance,Decreased knowledge of precautions,Fascial restriction,Pain,IADL,Flexibility,Decreased knowledge of use of DME,Body mechanics,Dexterity,FMC,Strength,Tone,ROM       Visit Diagnosis: Hemiplegia and hemiparesis following cerebral infarction affecting right dominant side (HCC)  Muscle weakness (generalized)  Unsteadiness on feet  Pain in right wrist    Problem List Patient Active Problem List   Diagnosis Date Noted  . Spasticity as late effect of cerebrovascular accident (CVA) 04/22/2020  . Elevated BUN   . Sleep disturbance   . Infarction of left basal ganglia ([place]) 02/21/2020  . Right hemiparesis ([facility])   . Leukocytosis   . Sinus tachycardia   . Dyslipidemia   . History of gout   . Left sided lacunar infarction ([place]) 02/16/2020  . Chronic hepatitis C without hepatic coma ([place]) 01/20/2016  . Lumbar disc disease with radiculopathy 03/07/2012  . Essential hypertension 12/14/2009  . RHINITIS 03/26/2008  . Gout 01/15/2007  . ERECTILE DYSFUNCTION 01/15/2007  . CARPAL TUNNEL SYNDROME, RIGHT 01/15/2007    Zachery Conch MOT, OTR/L  05/14/2020, 2:15 PM  [facility] [address] [place], [place], [address] Phone: [phone]   Fax:  [phone]  Name: Brent Weedman Sr. MRN: [id number] Date of Birth: 11-24-1950

## 2020-05-18 ENCOUNTER — Ambulatory Visit: Payer: 59 | Attending: Physician Assistant | Admitting: Occupational Therapy

## 2020-05-18 ENCOUNTER — Encounter: Payer: Self-pay | Admitting: Occupational Therapy

## 2020-05-18 DIAGNOSIS — M25531 Pain in right wrist: Secondary | ICD-10-CM | POA: Diagnosis present

## 2020-05-18 DIAGNOSIS — R600 Localized edema: Secondary | ICD-10-CM | POA: Insufficient documentation

## 2020-05-18 DIAGNOSIS — R2689 Other abnormalities of gait and mobility: Secondary | ICD-10-CM | POA: Diagnosis not present

## 2020-05-18 DIAGNOSIS — M6281 Muscle weakness (generalized): Secondary | ICD-10-CM | POA: Insufficient documentation

## 2020-05-18 DIAGNOSIS — I69351 Hemiplegia and hemiparesis following cerebral infarction affecting right dominant side: Secondary | ICD-10-CM | POA: Insufficient documentation

## 2020-05-18 DIAGNOSIS — R293 Abnormal posture: Secondary | ICD-10-CM | POA: Diagnosis not present

## 2020-05-18 DIAGNOSIS — R2681 Unsteadiness on feet: Secondary | ICD-10-CM | POA: Insufficient documentation

## 2020-05-18 NOTE — Therapy (Signed)
[facility] [address] [place], [place], [address] Phone: [phone]   Fax:  [phone]  Occupational Therapy Treatment  Patient Details  Name: Brent Devita Sr. MRN: [id number] Date of Birth: 05-08-1950 Referring Provider (OT): Marlowe Shores   Encounter Date: 05/18/2020   OT End of Session - 05/18/20 1921    Visit Number 15    Number of Visits 25    Date for OT Re-Evaluation 06/16/20    Authorization Type [facility]    Authorization Time Period week 9 of 12 (5/2)    Progress Note Due on Visit 20    OT Start Time 1545    OT Stop Time 1630    OT Time Calculation (min) 45 min    Activity Tolerance Patient tolerated treatment well    Behavior During Therapy WFL for tasks assessed/performed           Past Medical History:  Diagnosis Date  . Carpal tunnel syndrome   . Gout   . Hyperlipidemia    on meds  . Hypertension    on meds    Past Surgical History:  Procedure Laterality Date  . COLONOSCOPY  2015   DJ-MAC-polyps  . LOOP RECORDER INSERTION N/A 02/19/2020   Procedure: LOOP RECORDER INSERTION;  Surgeon: Constance Haw, MD;  Location: [facility];  Service: Cardiovascular;  Laterality: N/A;  . no prior surgery    . POLYPECTOMY  2015   DJ-MAC-polyps    There were no vitals filed for this visit.   Subjective Assessment - 05/18/20 1919    Subjective  It had a dull ache when I got here - I am trying to avoid lifting - becasue that still hurts.    Currently in Pain? Yes    Pain Score 2     Pain Location Shoulder    Pain Orientation Right    Pain Descriptors / Indicators Dull    Pain Type Chronic pain    Pain Onset 1 to 4 weeks ago    Pain Frequency Intermittent    Aggravating Factors  lifting    Pain Relieving Factors rest, gentle movement    Multiple Pain Sites No          Patient seen for aquatic therapy visit.  Patient entered and exited the pool via stairs and supervision/cueing  to utilize a step through pattern.  Worked on reach pattern of RUE on surface of water with UE supported on floatation board.  Worked on BUE active movement to allow LUE to model degree of movement/ effort, etc.  Worked on active resistance by submerging dumbbell during forward flexion with elbow extension,  Worked on overhead reach at high ladder rail - to address maintaining modified closed chain and increasing shoulder range - pain free ranges.  Walking to increase demand for whole body, challenge balance, and coordiantion between R/L, Upper / lower limbs.                          OT Short Term Goals - 05/18/20 1923      OT SHORT TERM GOAL #1   Title Patient will complete a home exercise program designed to improve RUE range of motion    Period Weeks    Status Achieved    Target Date 05/02/20      OT SHORT TERM GOAL #2   Title Patient will utilize RUE to grasp release feeding utensil with min assist  Time 4    Period Weeks    Status Achieved      OT SHORT TERM GOAL #3   Title Patient will tie shoes bimanually with increased time and min assist    Time 4    Period Weeks    Status Achieved      OT SHORT TERM GOAL #4   Title Patient will zipper light weight jacket, or button 2 buttons on shirt with increased time and min assist    Time 4    Period Weeks    Status Achieved      OT SHORT TERM GOAL #5   Title Patient will utilize BUE to wash a lightweight dish while standing at sink    Time 4    Period Weeks    Status Achieved             OT Long Term Goals - 05/18/20 1924      OT LONG TERM GOAL #1   Title Patient will complete and updated HEP to address RUE ROM and functional strength    Time 12    Period Weeks    Status On-going   continue to add PRN     OT LONG TERM GOAL #2   Title Patient will utilize BUE to weave chair seating (caning) with increased time and intermittent support    Time 12    Period Weeks    Status On-going   has not tried  yet - will continue to work towards coordination with [place] #3   Title Patient will write a full sentence with RUE legibly    Time 8    Period Weeks    Status Achieved   pt reports it is better and wrote a sentence in clinic with 100% legibility     OT LONG TERM GOAL #4   Title Patient will reach into overhead cabinet to obtain a lightweight object (less than 3 lb) with RUE    Time 12    Period Weeks    Status On-going   pt with increased pain in RUE shoulder     OT LONG TERM GOAL #5   Title Patient will utilize mallet with RUE to tap to open joints of chair in furniture restoration project    Time 12    Period Weeks    Status On-going   still working towards increasing strength for using mallet     OT LONG TERM GOAL #6   Title Patient will demonstrate understanding of recommendations relating to retunring to driving    Time 12    Period Weeks    Status Achieved                 Plan - 05/18/20 1921    Clinical Impression Statement Pt reports wrist pain is improving, but having stiffness in digits and ache in shoulder/R arm    OT Occupational Profile and History Detailed Assessment- Review of Records and additional review of physical, cognitive, psychosocial history related to current functional performance    Occupational performance deficits (Please refer to evaluation for details): ADL's;IADL's;Work    Body Structure / Function / Physical Skills ADL;Coordination;Endurance;GMC;UE functional use;Balance;Decreased knowledge of precautions;Fascial restriction;Pain;IADL;Flexibility;Decreased knowledge of use of DME;Body mechanics;Dexterity;FMC;Strength;Tone;ROM    Rehab Potential Excellent    Clinical Decision Making Several treatment options, min-mod task modification necessary    Comorbidities Affecting Occupational Performance: May have comorbidities impacting occupational performance    Modification or  Assistance to Complete Evaluation  Min-Moderate  modification of tasks or assist with assess necessary to complete eval    OT Frequency 2x / week    OT Duration 12 weeks    OT Treatment/Interventions Self-care/ADL training;Electrical Stimulation;Therapeutic exercise;Aquatic Therapy;Moist Heat;Neuromuscular education;Compression bandaging;Splinting;Patient/family education;Balance training;Therapeutic activities;Functional Mobility Training;Fluidtherapy;Cryotherapy;Ultrasound;Contrast Bath;DME and/or AE instruction;Manual Therapy;Passive range of motion    Plan ASSESS SHOULDER PAIN, Discuss aquatics availabillity; NMR proximal RUE - Needs scap stabilizers and improved strength/range, Functional use of RUE - for eating, inhand manip, tool use    OT Home Exercise Plan coordination HEP, putty HEP, standing rocking for shoulder ranges    Consulted and Agree with Plan of Care Patient           Patient will benefit from skilled therapeutic intervention in order to improve the following deficits and impairments:   Body Structure / Function / Physical Skills: ADL,Coordination,Endurance,GMC,UE functional use,Balance,Decreased knowledge of precautions,Fascial restriction,Pain,IADL,Flexibility,Decreased knowledge of use of DME,Body mechanics,Dexterity,FMC,Strength,Tone,ROM       Visit Diagnosis: Hemiplegia and hemiparesis following cerebral infarction affecting right dominant side (HCC)  Muscle weakness (generalized)  Unsteadiness on feet  Pain in right wrist  Localized edema    Problem List Patient Active Problem List   Diagnosis Date Noted  . Spasticity as late effect of cerebrovascular accident (CVA) 04/22/2020  . Elevated BUN   . Sleep disturbance   . Infarction of left basal ganglia ([place]) 02/21/2020  . Right hemiparesis ([place])   . Leukocytosis   . Sinus tachycardia   . Dyslipidemia   . History of gout   . Left sided lacunar infarction ([place]) 02/16/2020  . Chronic hepatitis C without hepatic coma ([place]) 01/20/2016  . Lumbar disc  disease with radiculopathy 03/07/2012  . Essential hypertension 12/14/2009  . RHINITIS 03/26/2008  . Gout 01/15/2007  . ERECTILE DYSFUNCTION 01/15/2007  . CARPAL TUNNEL SYNDROME, RIGHT 01/15/2007    Mariah Milling, OTR/L 05/18/2020, 7:25 PM  [facility] [address] [place], [place], [address] Phone: [phone]   Fax:  [phone]  Name: Inaki Vantine Sr. MRN: [id number] Date of Birth: July 16, 1950

## 2020-05-20 ENCOUNTER — Ambulatory Visit: Payer: 59

## 2020-05-20 ENCOUNTER — Other Ambulatory Visit: Payer: Self-pay

## 2020-05-20 ENCOUNTER — Ambulatory Visit: Payer: 59 | Admitting: Occupational Therapy

## 2020-05-20 ENCOUNTER — Encounter: Payer: Self-pay | Admitting: Occupational Therapy

## 2020-05-20 DIAGNOSIS — M6281 Muscle weakness (generalized): Secondary | ICD-10-CM

## 2020-05-20 DIAGNOSIS — R293 Abnormal posture: Secondary | ICD-10-CM

## 2020-05-20 DIAGNOSIS — R2689 Other abnormalities of gait and mobility: Secondary | ICD-10-CM | POA: Diagnosis not present

## 2020-05-20 DIAGNOSIS — R2681 Unsteadiness on feet: Secondary | ICD-10-CM

## 2020-05-20 DIAGNOSIS — I69351 Hemiplegia and hemiparesis following cerebral infarction affecting right dominant side: Secondary | ICD-10-CM

## 2020-05-20 DIAGNOSIS — M25531 Pain in right wrist: Secondary | ICD-10-CM | POA: Diagnosis not present

## 2020-05-20 DIAGNOSIS — R600 Localized edema: Secondary | ICD-10-CM | POA: Diagnosis not present

## 2020-05-20 NOTE — Progress Notes (Signed)
[facility] Summary Report / Loop Recorder 

## 2020-05-20 NOTE — Therapy (Signed)
[facility] [address] [place], [place], [address] Phone: [phone]   Fax:  [phone]  Occupational Therapy Treatment  Patient Details  Name: Brent Jaquith Sr. MRN: [id number] Date of Birth: 10/27/1950 Referring Provider (OT): Marlowe Shores   Encounter Date: 05/20/2020   OT End of Session - 05/20/20 1853    Visit Number 16    Number of Visits 25    Date for OT Re-Evaluation 06/16/20    Authorization Type [facility] [facility]    Authorization Time Period week 9 of 12 (5/2)    Progress Note Due on Visit 20    OT Start Time 1618    OT Stop Time 1700    OT Time Calculation (min) 42 min    Activity Tolerance Patient tolerated treatment well    Behavior During Therapy WFL for tasks assessed/performed           Past Medical History:  Diagnosis Date  . Carpal tunnel syndrome   . Gout   . Hyperlipidemia    on meds  . Hypertension    on meds    Past Surgical History:  Procedure Laterality Date  . COLONOSCOPY  2015   DJ-MAC-polyps  . LOOP RECORDER INSERTION N/A 02/19/2020   Procedure: LOOP RECORDER INSERTION;  Surgeon: Constance Haw, MD;  Location: [facility];  Service: Cardiovascular;  Laterality: N/A;  . no prior surgery    . POLYPECTOMY  2015   DJ-MAC-polyps    There were no vitals filed for this visit.   Subjective Assessment - 05/20/20 1848    Subjective  Patient reports that hand is aching in joints - feels stiff    Patient is accompanied by: Family member    Currently in Pain? Yes    Pain Score 2     Pain Location Hand    Pain Orientation Right    Pain Descriptors / Indicators Aching    Pain Type Acute pain    Pain Onset In the past 7 days    Pain Frequency Intermittent    Aggravating Factors  use    Pain Relieving Factors heat                        OT Treatments/Exercises (OP) - 05/20/20 0001      Modalities   Modalities Fluidotherapy      RUE Fluidotherapy    Number Minutes Fluidotherapy 12 Minutes    RUE Fluidotherapy Location Hand;Wrist;Forearm    Comments prior to stretching      Manual Therapy   Manual Therapy Edema management    Edema Management discussed and demonstrated pumping action  and elevation to reduce edema    Joint Mobilization to encourage digit flexion/extension                  OT Education - 05/20/20 1853    Education Details edema management for R hand    Person(s) Educated Patient;Spouse    Methods Explanation;Demonstration;Tactile cues;Verbal cues    Comprehension Verbalized understanding;Returned demonstration            OT Short Term Goals - 05/20/20 1855      OT SHORT TERM GOAL #1   Title Patient will complete a home exercise program designed to improve RUE range of motion    Period Weeks    Status Achieved    Target Date 05/02/20      OT SHORT TERM GOAL #2   Title  Patient will utilize RUE to grasp release feeding utensil with min assist    Time 4    Period Weeks    Status Achieved      OT SHORT TERM GOAL #3   Title Patient will tie shoes bimanually with increased time and min assist    Time 4    Period Weeks    Status Achieved      OT SHORT TERM GOAL #4   Title Patient will zipper light weight jacket, or button 2 buttons on shirt with increased time and min assist    Time 4    Period Weeks    Status Achieved      OT SHORT TERM GOAL #5   Title Patient will utilize BUE to wash a lightweight dish while standing at sink    Time 4    Period Weeks    Status Achieved             OT Long Term Goals - 05/20/20 1855      OT LONG TERM GOAL #1   Title Patient will complete and updated HEP to address RUE ROM and functional strength    Time 12    Period Weeks    Status On-going   continue to add PRN     OT LONG TERM GOAL #2   Title Patient will utilize BUE to weave chair seating (caning) with increased time and intermittent support    Time 12    Period Weeks    Status On-going    has not tried yet - will continue to work towards coordination with [place] #3   Title Patient will write a full sentence with RUE legibly    Time 8    Period Weeks    Status Achieved   pt reports it is better and wrote a sentence in clinic with 100% legibility     OT LONG TERM GOAL #4   Title Patient will reach into overhead cabinet to obtain a lightweight object (less than 3 lb) with RUE    Time 12    Period Weeks    Status On-going   pt with increased pain in RUE shoulder     OT LONG TERM GOAL #5   Title Patient will utilize mallet with RUE to tap to open joints of chair in furniture restoration project    Time 12    Period Weeks    Status On-going   still working towards increasing strength for using mallet     OT LONG TERM GOAL #6   Title Patient will demonstrate understanding of recommendations relating to retunring to driving    Time 12    Period Weeks    Status Achieved                 Plan - 05/20/20 1854    Clinical Impression Statement Pt reports wrist pain is improving, but having stiffness in digits and ache in shoulder/R arm    OT Occupational Profile and History Detailed Assessment- Review of Records and additional review of physical, cognitive, psychosocial history related to current functional performance    Occupational performance deficits (Please refer to evaluation for details): ADL's;IADL's;Work    Body Structure / Function / Physical Skills ADL;Coordination;Endurance;GMC;UE functional use;Balance;Decreased knowledge of precautions;Fascial restriction;Pain;IADL;Flexibility;Decreased knowledge of use of DME;Body mechanics;Dexterity;FMC;Strength;Tone;ROM    Rehab Potential Excellent    Clinical Decision Making Several treatment options, min-mod task modification necessary  Comorbidities Affecting Occupational Performance: May have comorbidities impacting occupational performance    Modification or Assistance to Complete Evaluation   Min-Moderate modification of tasks or assist with assess necessary to complete eval    OT Frequency 2x / week    OT Duration 12 weeks    OT Treatment/Interventions Self-care/ADL training;Electrical Stimulation;Therapeutic exercise;Aquatic Therapy;Moist Heat;Neuromuscular education;Compression bandaging;Splinting;Patient/family education;Balance training;Therapeutic activities;Functional Mobility Training;Fluidtherapy;Cryotherapy;Ultrasound;Contrast Bath;DME and/or AE instruction;Manual Therapy;Passive range of motion    Plan Need to work in pain free ranges - needs better muscle balance in shoulder and elbow - NMR proximal RUE - Needs scap stabilizers and improved strength/range, Functional use of RUE - for eating, in hand manip, tool use    OT Home Exercise Plan coordination HEP, putty HEP, standing rocking for shoulder ranges    Consulted and Agree with Plan of Care Patient;Family member/caregiver    Family Member Consulted Wife- Mary           Patient will benefit from skilled therapeutic intervention in order to improve the following deficits and impairments:   Body Structure / Function / Physical Skills: ADL,Coordination,Endurance,GMC,UE functional use,Balance,Decreased knowledge of precautions,Fascial restriction,Pain,IADL,Flexibility,Decreased knowledge of use of DME,Body mechanics,Dexterity,FMC,Strength,Tone,ROM       Visit Diagnosis: Hemiplegia and hemiparesis following cerebral infarction affecting right dominant side (HCC)  Muscle weakness (generalized)  Unsteadiness on feet  Pain in right wrist  Localized edema    Problem List Patient Active Problem List   Diagnosis Date Noted  . Spasticity as late effect of cerebrovascular accident (CVA) 04/22/2020  . Elevated BUN   . Sleep disturbance   . Infarction of left basal ganglia ([facility]) 02/21/2020  . Right hemiparesis ([facility])   . Leukocytosis   . Sinus tachycardia   . Dyslipidemia   . History of gout   . Left sided  lacunar infarction ([facility]) 02/16/2020  . Chronic hepatitis C without hepatic coma ([facility]) 01/20/2016  . Lumbar disc disease with radiculopathy 03/07/2012  . Essential hypertension 12/14/2009  . RHINITIS 03/26/2008  . Gout 01/15/2007  . ERECTILE DYSFUNCTION 01/15/2007  . CARPAL TUNNEL SYNDROME, RIGHT 01/15/2007    Mariah Milling, OTR/L 05/20/2020, 6:56 PM  [facility] [address] [place], [place], [address] Phone: [phone]   Fax:  [phone]  Name: Brent Mccarey Sr. MRN: [id number] Date of Birth: 1951/01/09

## 2020-05-21 NOTE — Therapy (Signed)
[facility] [address] [place], [place], [address] Phone: [phone]   Fax:  [phone]  Physical Therapy Treatment/Recert  Patient Details  Name: Brent Couper Sr. MRN: [id number] Date of Birth: 01-02-1951 Referring Provider (PT): Cathlyn Parsons, PA-C   Encounter Date: 05/20/2020   PT End of Session - 05/20/20 1541    Visit Number 14    Number of Visits 20    Date for PT Re-Evaluation 29/51/88   8 week cert, 6 week poc   Authorization Type [facility]; $20 copay, medicare secondary also    PT Start Time 1539    PT Stop Time 1618    PT Time Calculation (min) 39 min    Activity Tolerance Patient tolerated treatment well    Behavior During Therapy WFL for tasks assessed/performed           Past Medical History:  Diagnosis Date  . Carpal tunnel syndrome   . Gout   . Hyperlipidemia    on meds  . Hypertension    on meds    Past Surgical History:  Procedure Laterality Date  . COLONOSCOPY  2015   DJ-MAC-polyps  . LOOP RECORDER INSERTION N/A 02/19/2020   Procedure: LOOP RECORDER INSERTION;  Surgeon: Constance Haw, MD;  Location: [facility];  Service: Cardiovascular;  Laterality: N/A;  . no prior surgery    . POLYPECTOMY  2015   DJ-MAC-polyps    There were no vitals filed for this visit.   Subjective Assessment - 05/20/20 1541    Subjective Pt reports he has been doing well with his walking. He has noticed that he is getting some low back pain when walking longer periods like over and hour. Does find that bending hurts when it is acting up. Stays in low back.    Patient is accompained by: Family member    Pertinent History carpal tunnel, HTN, HLD, DM, gout, and former smoker.    Patient Stated Goals Increase use of RUE; walk without the walker.    Currently in Pain? No/denies    Pain Score 0-No pain   worst 5/10   Pain Location Back    Pain Orientation Lower    Pain Descriptors /  Indicators Aching    Pain Type Acute pain    Pain Onset 1 to 4 weeks ago    Pain Frequency Intermittent    Pain Relieving Factors laying down and feels better overnight.              [facility] PT Assessment - 05/20/20 1544      Functional Gait  Assessment   Gait assessed  Yes    Gait Level Surface Walks 20 ft in less than 7 sec but greater than 5.5 sec, uses assistive device, slower speed, mild gait deviations, or deviates 6-10 in outside of the 12 in walkway width.    Change in Gait Speed Able to smoothly change walking speed without loss of balance or gait deviation. Deviate no more than 6 in outside of the 12 in walkway width.    Gait with Horizontal Head Turns Performs head turns smoothly with slight change in gait velocity (eg, minor disruption to smooth gait path), deviates 6-10 in outside 12 in walkway width, or uses an assistive device.    Gait with Vertical Head Turns Performs task with slight change in gait velocity (eg, minor disruption to smooth gait path), deviates 6 - 10 in outside 12 in walkway width or uses  assistive device    Gait and Pivot Turn Pivot turns safely in greater than 3 sec and stops with no loss of balance, or pivot turns safely within 3 sec and stops with mild imbalance, requires small steps to catch balance.    Step Over Obstacle Is able to step over 2 stacked shoe boxes taped together (9 in total height) without changing gait speed. No evidence of imbalance.    Gait with Narrow Base of Support Ambulates 4-7 steps.    Gait with Eyes Closed Walks 20 ft, uses assistive device, slower speed, mild gait deviations, deviates 6-10 in outside 12 in walkway width. Ambulates 20 ft in less than 9 sec but greater than 7 sec.    Ambulating Backwards Walks 20 ft, uses assistive device, slower speed, mild gait deviations, deviates 6-10 in outside 12 in walkway width.    Steps Alternating feet, must use rail.    Total Score 21                         [facility] Adult  PT Treatment/Exercise - 05/20/20 1544      Ambulation/Gait   Ambulation/Gait Yes    Ambulation/Gait Assistance 6: Modified independent (Device/Increase time);5: Supervision    Ambulation/Gait Assistance Details Verbal cues to try to relax arms and get more arm swing as well as trunk rotation. Pt very stiff through trunk. Still has decreased toe off on right. Able to show improved right heel strike.    Ambulation Distance (Feet) 850 Feet    Assistive device None    Gait Pattern Step-through pattern;Decreased stance time - right;Decreased arm swing - right;Decreased trunk rotation    Ambulation Surface Level;Unlevel;Indoor;Outdoor;Paved;Grass      Standardized Balance Assessment   Standardized Balance Assessment Berg Balance Test;Timed Up and Go Test      Berg Balance Test   Sit to Stand Able to stand without using hands and stabilize independently    Standing Unsupported Able to stand safely 2 minutes    Sitting with Back Unsupported but Feet Supported on Floor or Stool Able to sit safely and securely 2 minutes    Stand to Sit Sits safely with minimal use of hands    Transfers Able to transfer safely, minor use of hands    Standing Unsupported with Eyes Closed Able to stand 10 seconds safely    Standing Ubsupported with Feet Together Able to place feet together independently and stand 1 minute safely    From Standing, Reach Forward with Outstretched Arm Can reach confidently >25 cm (10")    From Standing Position, Pick up Object from Floor Able to pick up shoe safely and easily    From Standing Position, Turn to Look Behind Over each Shoulder Looks behind from both sides and weight shifts well    Turn 360 Degrees Able to turn 360 degrees safely in 4 seconds or less    Standing Unsupported, Alternately Place Feet on Step/Stool Able to stand independently and safely and complete 8 steps in 20 seconds    Standing Unsupported, One Foot in Front Able to plae foot ahead of the other independently  and hold 30 seconds    Standing on One Leg Able to lift leg independently and hold equal to or more than 3 seconds    Total Score 53      Timed Up and Go Test   TUG Normal TUG    Normal TUG (seconds) 8.78   8.6 and 8.96  sec                 PT Education - 05/21/20 0854    Education Details Discussed results of testing as well as plan to recert 1x/week for 6 weeks.    Person(s) Educated Patient;Spouse    Methods Explanation    Comprehension Verbalized understanding            PT Short Term Goals - 04/14/20 1432      PT SHORT TERM GOAL #1   Title Pt will demonstrate independence with initial balance/LE strengthening HEP    Time 4    Period Weeks    Status Achieved    Target Date 04/16/20      PT SHORT TERM GOAL #2   Title Pt will increase 30 second sit to stand to 10 repetitions without use of UE to indicate increased functional LE strength    Baseline 5.5 reps > 11 reps    Time 4    Period Weeks    Status Achieved    Target Date 04/16/20      PT SHORT TERM GOAL #3   Title Pt will decrease time to perform TUG with RW to </= 17 seconds and without RW to </= 20 seconds to indicate decreased falls risk    Baseline 21 seconds with RW, 24 seconds without RW > 11 seconds no AD    Time 4    Period Weeks    Status Achieved    Target Date 04/16/20      PT SHORT TERM GOAL #4   Title Pt will increase BERG score by 5 points to indicate decreased risk for falls    Baseline 32/56 > 51/56    Time 4    Period Weeks    Status Achieved    Target Date 04/16/20      PT SHORT TERM GOAL #5   Title Pt will ambulate x 230' over level, indoor surfaces with L and R turns safely with supervision without RW    Time 4    Period Weeks    Status Achieved    Target Date 04/16/20             PT Long Term Goals - 05/20/20 1546      PT LONG TERM GOAL #1   Title Pt will be independent with final HEP  (ALL LTG DUE BY 05/16/20)    Baseline 05/20/20 Pt doing well with current HEP. PT  continues to update.    Time 8    Period Weeks    Status On-going      PT LONG TERM GOAL #2   Title Pt will report overall improvement in function on FOTO to >/= 64%    Baseline 50%. continuing with PT    Time 8    Period Weeks    Status On-going      PT LONG TERM GOAL #3   Title Pt will ambulate x 500' outside over paved surfaces, negotiate curb and negotiate 8 stairs with one rail with supervision (no AD)    Baseline 05/20/20 850' supervision on varied surfaces more for cuing, mod I with steps with 1 rail.    Time 8    Period Weeks    Status Achieved      PT LONG TERM GOAL #4   Title Pt will increase to 15 reps for 30 second sit to stand to indicate increased functional LE strength    Baseline 15 reps without hands  from mat    Time 8    Period Weeks    Status Achieved      PT LONG TERM GOAL #5   Title Pt will decrease time to perform TUG without AD to </= 15 seconds    Baseline 11 seconds, WNL - goal met without AD    Time 8    Period Weeks    Status Achieved      PT LONG TERM GOAL #6   Title Pt will increase BERG to >/= 54/56 to indicate decreased falls risk    Baseline 51/56. 05/20/20 53/56    Time 8    Period Weeks    Status Partially Met           Updated goals:  PT Short Term Goals - 05/21/20 0901      PT SHORT TERM GOAL #1   Title Pt will be instructed in ways to manage low back pain including stretching to improve flexibility with reports of <4/10 pain after activities.    Baseline 5/10 with activities >1 hour.    Time 3    Period Weeks    Status New    Target Date 06/11/20           PT Long Term Goals - 05/21/20 0902      PT LONG TERM GOAL #1   Title Pt will be independent with final HEP  (ALL LTG DUE BY 05/16/20)    Baseline 05/20/20 Pt doing well with current HEP. PT continues to update.    Time 6    Period Weeks    Status On-going    Target Date 07/02/20      PT LONG TERM GOAL #2   Title Pt will report overall improvement in function on FOTO  to >/= 64%    Baseline 50%. continuing with PT    Time 6    Period Weeks    Status On-going    Target Date 07/02/20      PT LONG TERM GOAL #3   Title Pt will ambulate on varied surfaces >1000' without AD independently with noted improved trunk rotation, arm swing and right toe off.    Baseline 05/20/20 850' supervision on varied surfaces more for cuing, mod I with steps with 1 rail.    Time 6    Period Weeks    Status New    Target Date 07/02/20      PT LONG TERM GOAL #4   Title Pt will increase SLS time to >7 sec bilateral for improved balance.    Baseline 05/20/20 5 sec bilateral    Time 6    Period Weeks    Status New    Target Date 07/02/20      PT LONG TERM GOAL #5   Title Pt will increase FGA from 21 to >24/30 for improved balance and gait safety.    Baseline 05/20/20 21/30    Time 6    Period Weeks    Status New    Target Date 07/02/20                Plan - 05/21/20 0855    Clinical Impression Statement Pt progressing well towards all goals. He has met TUG further decreasing time to 8.78 sec today showing improving balance. He met 30 sec sit to stand goal with 15 reps showing improving functional strength. He increased Berg Balance to 53/56 placing him at low fall risk. SLS and tandem stance were most challenging  for him with only ~5 sec on SLS bilateral. FGA was performed for higher level balance assessment with score of 21/30 indicating moderate fall risk. Pt is ambulating without AD mod I/supervision. He has decreased toe off on right with decreased trunk rotation. Is reporting some low back pain with longer walking and working on feet >1 hour with back being tight up to 5/10 pain fractures. Pt will benefit from skilled PT to continue to address strength, high level balance and gait deficits to maximize function as well as decrease back pain.    Personal Factors and Comorbidities Comorbidity 3+;Profession    Comorbidities carpal tunnel, HTN, HLD, DM, gout, and former  smoker    Examination-Activity Limitations Locomotion Level;Transfers;Stairs    Examination-Participation Restrictions Driving;Occupation;Community Activity    Stability/Clinical Decision Making Evolving/Moderate complexity    Rehab Potential Good    PT Frequency 1x / week    PT Duration 6 weeks    PT Treatment/Interventions ADLs/Self Care Home Management;Aquatic Therapy;Cryotherapy;Electrical Stimulation;Moist Heat;DME Instruction;Gait training;Stair training;Functional mobility training;Therapeutic activities;Therapeutic exercise;Balance training;Neuromuscular re-education;Patient/family education;Orthotic Fit/Training;Manual techniques;Passive range of motion    PT Next Visit Plan Work on stretches to low back to help improve trunk rotation and decrease muscle tightness. continue dynamic gait activities without AD. Working on increasing right toe off/ stance time - treadmill with incline, trampoline, etc.. Hip ext exercises.  Continue RLE strengthening and balance training. Rockerboard activities. SLS activities.    Consulted and Agree with Plan of Care Patient;Family member/caregiver    Family Member Consulted wife           Patient will benefit from skilled therapeutic intervention in order to improve the following deficits and impairments:  Abnormal gait,Decreased balance,Decreased strength,Pain  Visit Diagnosis: Other abnormalities of gait and mobility  Muscle weakness (generalized)  Abnormal posture  Hemiplegia and hemiparesis following cerebral infarction affecting right dominant side [facility])     Problem List Patient Active Problem List   Diagnosis Date Noted  . Spasticity as late effect of cerebrovascular accident (CVA) 04/22/2020  . Elevated BUN   . Sleep disturbance   . Infarction of left basal ganglia ([facility]) 02/21/2020  . Right hemiparesis ([place])   . Leukocytosis   . Sinus tachycardia   . Dyslipidemia   . History of gout   . Left sided lacunar infarction ([facility])  02/16/2020  . Chronic hepatitis C without hepatic coma ([facility]) 01/20/2016  . Lumbar disc disease with radiculopathy 03/07/2012  . Essential hypertension 12/14/2009  . RHINITIS 03/26/2008  . Gout 01/15/2007  . ERECTILE DYSFUNCTION 01/15/2007  . CARPAL TUNNEL SYNDROME, RIGHT 01/15/2007    Electa Sniff, PT, DPT, NCS 05/21/2020, 9:00 AM  [facility] [address] [place], [place], [address] Phone: [phone]   Fax:  [phone]  Name: Murice Barbar Sr. MRN: [id number] Date of Birth: February 17, 1950

## 2020-05-22 ENCOUNTER — Other Ambulatory Visit: Payer: Self-pay

## 2020-05-22 NOTE — Patient Outreach (Signed)
[facility]) Care Management  05/22/2020  Brent Gorter Sr. 04/04/1950 [id number]   First telephone outreach attempt to obtain mRS. No answer. Left message for returned call.  Philmore Pali [facility] Management Assistant [phone]

## 2020-05-25 ENCOUNTER — Encounter: Payer: Self-pay | Admitting: Occupational Therapy

## 2020-05-25 ENCOUNTER — Ambulatory Visit: Payer: 59 | Admitting: Occupational Therapy

## 2020-05-25 DIAGNOSIS — R2681 Unsteadiness on feet: Secondary | ICD-10-CM | POA: Diagnosis not present

## 2020-05-25 DIAGNOSIS — M25531 Pain in right wrist: Secondary | ICD-10-CM | POA: Diagnosis not present

## 2020-05-25 DIAGNOSIS — R600 Localized edema: Secondary | ICD-10-CM | POA: Diagnosis not present

## 2020-05-25 DIAGNOSIS — I69351 Hemiplegia and hemiparesis following cerebral infarction affecting right dominant side: Secondary | ICD-10-CM | POA: Diagnosis not present

## 2020-05-25 DIAGNOSIS — R2689 Other abnormalities of gait and mobility: Secondary | ICD-10-CM | POA: Diagnosis not present

## 2020-05-25 DIAGNOSIS — R293 Abnormal posture: Secondary | ICD-10-CM | POA: Diagnosis not present

## 2020-05-25 DIAGNOSIS — M6281 Muscle weakness (generalized): Secondary | ICD-10-CM | POA: Diagnosis not present

## 2020-05-25 NOTE — Therapy (Signed)
[facility] [address] [place], [place], [address] Phone: [phone]   Fax:  [phone]  Occupational Therapy Treatment  Patient Details  Name: Brent Conant Sr. MRN: [id number] Date of Birth: [birth year] Referring Provider (OT): Marlowe Shores   Encounter Date: 05/25/2020   OT End of Session - 05/25/20 1919    Visit Number 17    Number of Visits 25    Date for OT Re-Evaluation 06/16/20    Authorization Type [facility] [facility]    Authorization Time Period week 10 of 12 (5/9)    Progress Note Due on Visit 20    OT Start Time 1545    OT Stop Time 1630    OT Time Calculation (min) 45 min    Activity Tolerance Patient tolerated treatment well    Behavior During Therapy WFL for tasks assessed/performed           Past Medical History:  Diagnosis Date  . Carpal tunnel syndrome   . Gout   . Hyperlipidemia    on meds  . Hypertension    on meds    Past Surgical History:  Procedure Laterality Date  . COLONOSCOPY  2015   DJ-MAC-polyps  . LOOP RECORDER INSERTION N/A 02/19/2020   Procedure: LOOP RECORDER INSERTION;  Surgeon: Constance Haw, MD;  Location: [facility];  Service: Cardiovascular;  Laterality: N/A;  . no prior surgery    . POLYPECTOMY  2015   DJ-MAC-polyps    There were no vitals filed for this visit.   Subjective Assessment - 05/25/20 1918    Subjective  Its still sore when I reach, so I try to avoid that.  Hand is a little puffy    Currently in Pain? No/denies    Pain Score 0-No pain          Patient seen for aquatic therapy visit.  Patient entered and exited the water via stairs without assistance.  Patient treatment occurred in 3.5-4.5 ft of water.  Worked on buoyancy assisted and resisted motion of shoulder flexion and extension while standing intially against wall for support, then in open water.  Seated on underwater bench addressed edema in right hand, and active extension of wrist and  digits through weight bearing during transitional movements.  Continue to work on postural control and activation with all attempts for RUE movement and working in pain free supported ranges.                        OT Short Term Goals - 05/25/20 1921      OT SHORT TERM GOAL #1   Title Patient will complete a home exercise program designed to improve RUE range of motion    Period Weeks    Status Achieved    Target Date 05/02/20      OT SHORT TERM GOAL #2   Title Patient will utilize RUE to grasp release feeding utensil with min assist    Time 4    Period Weeks    Status Achieved      OT SHORT TERM GOAL #3   Title Patient will tie shoes bimanually with increased time and min assist    Time 4    Period Weeks    Status Achieved      OT SHORT TERM GOAL #4   Title Patient will zipper light weight jacket, or button 2 buttons on shirt with increased time and min assist  Time 4    Period Weeks    Status Achieved      OT SHORT TERM GOAL #5   Title Patient will utilize BUE to wash a lightweight dish while standing at sink    Time 4    Period Weeks    Status Achieved             OT Long Term Goals - 05/25/20 1921      OT LONG TERM GOAL #1   Title Patient will complete and updated HEP to address RUE ROM and functional strength    Time 12    Period Weeks    Status On-going   continue to add PRN     OT LONG TERM GOAL #2   Title Patient will utilize BUE to weave chair seating (caning) with increased time and intermittent support    Time 12    Period Weeks    Status On-going   has not tried yet - will continue to work towards coordination with [place] #3   Title Patient will write a full sentence with RUE legibly    Time 8    Period Weeks    Status Achieved   pt reports it is better and wrote a sentence in clinic with 100% legibility     OT LONG TERM GOAL #4   Title Patient will reach into overhead cabinet to obtain a lightweight  object (less than 3 lb) with RUE    Time 12    Period Weeks    Status On-going   pt with increased pain in RUE shoulder     OT LONG TERM GOAL #5   Title Patient will utilize mallet with RUE to tap to open joints of chair in furniture restoration project    Time 12    Period Weeks    Status On-going   still working towards increasing strength for using mallet     OT LONG TERM GOAL #6   Title Patient will demonstrate understanding of recommendations relating to retunring to driving    Time 12    Period Weeks    Status Achieved                 Plan - 05/25/20 1919    Clinical Impression Statement Pt reports improving functional use of RUE, but having stiffness in digits and ache in shoulder/R arm    OT Occupational Profile and History Detailed Assessment- Review of Records and additional review of physical, cognitive, psychosocial history related to current functional performance    Occupational performance deficits (Please refer to evaluation for details): ADL's;IADL's;Work    Body Structure / Function / Physical Skills ADL;Coordination;Endurance;GMC;UE functional use;Balance;Decreased knowledge of precautions;Fascial restriction;Pain;IADL;Flexibility;Decreased knowledge of use of DME;Body mechanics;Dexterity;FMC;Strength;Tone;ROM    Rehab Potential Excellent    Clinical Decision Making Several treatment options, min-mod task modification necessary    Comorbidities Affecting Occupational Performance: May have comorbidities impacting occupational performance    Modification or Assistance to Complete Evaluation  Min-Moderate modification of tasks or assist with assess necessary to complete eval    OT Frequency 2x / week    OT Duration 12 weeks    OT Treatment/Interventions Self-care/ADL training;Electrical Stimulation;Therapeutic exercise;Aquatic Therapy;Moist Heat;Neuromuscular education;Compression bandaging;Splinting;Patient/family education;Balance training;Therapeutic  activities;Functional Mobility Training;Fluidtherapy;Cryotherapy;Ultrasound;Contrast Bath;DME and/or AE instruction;Manual Therapy;Passive range of motion    Plan Need to work in pain free ranges - needs better muscle balance in shoulder and elbow - NMR proximal RUE -  Needs scap stabilizers and improved strength/range, Functional use of RUE - for eating, in hand manip, tool use    OT Home Exercise Plan coordination HEP, putty HEP, standing rocking for shoulder ranges    Consulted and Agree with Plan of Care Patient;Family member/caregiver           Patient will benefit from skilled therapeutic intervention in order to improve the following deficits and impairments:   Body Structure / Function / Physical Skills: ADL,Coordination,Endurance,GMC,UE functional use,Balance,Decreased knowledge of precautions,Fascial restriction,Pain,IADL,Flexibility,Decreased knowledge of use of DME,Body mechanics,Dexterity,FMC,Strength,Tone,ROM       Visit Diagnosis: Hemiplegia and hemiparesis following cerebral infarction affecting right dominant side (HCC)  Unsteadiness on feet  Pain in right wrist  Abnormal posture  Muscle weakness (generalized)    Problem List Patient Active Problem List   Diagnosis Date Noted  . Spasticity as late effect of cerebrovascular accident (CVA) 04/22/2020  . Elevated BUN   . Sleep disturbance   . Infarction of left basal ganglia ([place]) 02/21/2020  . Right hemiparesis ([place])   . Leukocytosis   . Sinus tachycardia   . Dyslipidemia   . History of gout   . Left sided lacunar infarction ([place]) 02/16/2020  . Chronic hepatitis C without hepatic coma ([place]) 01/20/2016  . Lumbar disc disease with radiculopathy 03/07/2012  . Essential hypertension 12/14/2009  . RHINITIS 03/26/2008  . Gout 01/15/2007  . ERECTILE DYSFUNCTION 01/15/2007  . CARPAL TUNNEL SYNDROME, RIGHT 01/15/2007    Mariah Milling 05/25/2020, [name] PM  [facility] [address] [place], [place], [address] Phone: [phone]   Fax:  [phone]  Name: Tierre Netto Sr. MRN: [id number] Date of Birth: August 06, 1950

## 2020-05-26 ENCOUNTER — Other Ambulatory Visit: Payer: Self-pay | Admitting: Adult Health

## 2020-05-26 ENCOUNTER — Other Ambulatory Visit (HOSPITAL_COMMUNITY): Payer: Self-pay

## 2020-05-26 DIAGNOSIS — I10 Essential (primary) hypertension: Secondary | ICD-10-CM

## 2020-05-26 MED ORDER — AMLODIPINE BESYLATE 5 MG PO TABS
5.0000 mg | ORAL_TABLET | Freq: Every day | ORAL | 2 refills | Status: DC
Start: 2020-05-26 — End: 2021-02-12
  Filled 2020-05-26: qty 90, 90d supply, fill #0
  Filled 2020-08-21: qty 90, 90d supply, fill #1
  Filled 2020-11-20: qty 90, 90d supply, fill #2

## 2020-05-26 MED ORDER — LOSARTAN POTASSIUM 100 MG PO TABS
ORAL_TABLET | Freq: Every day | ORAL | 3 refills | Status: DC
  Filled 2020-05-26: qty 90, 90d supply, fill #0
  Filled 2020-08-21: qty 90, 90d supply, fill #1
  Filled 2020-11-20: qty 90, 90d supply, fill #2
  Filled 2021-02-12: qty 90, 90d supply, fill #3

## 2020-05-26 MED ORDER — AMLODIPINE BESYLATE 2.5 MG PO TABS
ORAL_TABLET | Freq: Every day | ORAL | 3 refills | Status: DC
  Filled 2020-05-26 (×2): qty 90, 90d supply, fill #0

## 2020-05-27 ENCOUNTER — Ambulatory Visit: Payer: 59 | Admitting: Occupational Therapy

## 2020-05-27 ENCOUNTER — Encounter: Payer: Self-pay | Admitting: Occupational Therapy

## 2020-05-27 ENCOUNTER — Other Ambulatory Visit: Payer: Self-pay

## 2020-05-27 ENCOUNTER — Ambulatory Visit: Payer: 59

## 2020-05-27 DIAGNOSIS — M25531 Pain in right wrist: Secondary | ICD-10-CM | POA: Diagnosis not present

## 2020-05-27 DIAGNOSIS — I69351 Hemiplegia and hemiparesis following cerebral infarction affecting right dominant side: Secondary | ICD-10-CM | POA: Diagnosis not present

## 2020-05-27 DIAGNOSIS — R2689 Other abnormalities of gait and mobility: Secondary | ICD-10-CM | POA: Diagnosis not present

## 2020-05-27 DIAGNOSIS — R293 Abnormal posture: Secondary | ICD-10-CM

## 2020-05-27 DIAGNOSIS — R600 Localized edema: Secondary | ICD-10-CM

## 2020-05-27 DIAGNOSIS — M6281 Muscle weakness (generalized): Secondary | ICD-10-CM

## 2020-05-27 DIAGNOSIS — R2681 Unsteadiness on feet: Secondary | ICD-10-CM

## 2020-05-27 NOTE — Therapy (Signed)
[facility] [address] [place], [place], [address] Phone: [phone]   Fax:  [phone]  Occupational Therapy Treatment  Patient Details  Name: Brent Dorner Sr. MRN: [id number] Date of Birth: 09/16/1950 Referring Provider (OT): Marlowe Shores   Encounter Date: 05/27/2020   OT End of Session - 05/27/20 1721    Visit Number 18    Number of Visits 25    Date for OT Re-Evaluation 06/16/20    Authorization Type [facility]    Authorization Time Period week 10 of 12 (5/9)    Progress Note Due on Visit 20    OT Start Time 1615    OT Stop Time 1700    OT Time Calculation (min) 45 min    Activity Tolerance Patient tolerated treatment well    Behavior During Therapy [facility] for tasks assessed/performed           Past Medical History:  Diagnosis Date  . Carpal tunnel syndrome   . Gout   . Hyperlipidemia    on meds  . Hypertension    on meds    Past Surgical History:  Procedure Laterality Date  . COLONOSCOPY  2015   DJ-MAC-polyps  . LOOP RECORDER INSERTION N/A 02/19/2020   Procedure: LOOP RECORDER INSERTION;  Surgeon: Constance Haw, MD;  Location: [facility];  Service: Cardiovascular;  Laterality: N/A;  . no prior surgery    . POLYPECTOMY  2015   DJ-MAC-polyps    There were no vitals filed for this visit.   Subjective Assessment - 05/27/20 1615    Subjective  Same - pain with reaching.  Patient now driving    Currently in Pain? Yes    Pain Score 1     Pain Location Shoulder    Pain Orientation Right    Pain Descriptors / Indicators Aching    Pain Type Acute pain    Pain Onset 1 to 4 weeks ago    Pain Frequency Intermittent    Aggravating Factors  use - forward reach    Pain Relieving Factors rest                        OT Treatments/Exercises (OP) - 05/27/20 0001      Neurological Re-education Exercises   Other Exercises 1 Neuromuscular reeducation to address scapular  stabilizers in closed chain to modified closed chain conditions.  Worked with patient in modified plantigrade - working on weight shifting onto right limbs, and freeing up left limbs.  Worked on forward backward weight shift to address shoulder flexion and toward extension in closed chain.  Transitioned to quadruped and worked on increasing activation of RUE into surface and weight shift in pain free ranges.  Working to open hand, prepare radial and ulnar aspect of wrist and hand, as well as extension in digits.  Followed with modified closed chain with arm on ball on wall - arm stabilizing ball with subtle rotations from base of support.  Working on higher ranges of supported reach. Finally worked on open chain activities to open cabinet at eye level.  Patient needed cueing and facilitation to maintain trunk activation and weight through RLE to raise arm with flexed elbow to reach mid range.                    OT Short Term Goals - 05/27/20 1722      OT SHORT TERM GOAL #1   Title  Patient will complete a home exercise program designed to improve RUE range of motion    Period Weeks    Status Achieved    Target Date 05/02/20      OT SHORT TERM GOAL #2   Title Patient will utilize RUE to grasp release feeding utensil with min assist    Time 4    Period Weeks    Status Achieved      OT SHORT TERM GOAL #3   Title Patient will tie shoes bimanually with increased time and min assist    Time 4    Period Weeks    Status Achieved      OT SHORT TERM GOAL #4   Title Patient will zipper light weight jacket, or button 2 buttons on shirt with increased time and min assist    Time 4    Period Weeks    Status Achieved      OT SHORT TERM GOAL #5   Title Patient will utilize BUE to wash a lightweight dish while standing at sink    Time 4    Period Weeks    Status Achieved             OT Long Term Goals - 05/27/20 1722      OT LONG TERM GOAL #1   Title Patient will complete and  updated HEP to address RUE ROM and functional strength    Time 12    Period Weeks    Status On-going   continue to add PRN     OT LONG TERM GOAL #2   Title Patient will utilize BUE to weave chair seating (caning) with increased time and intermittent support    Time 12    Period Weeks    Status On-going   has not tried yet - will continue to work towards coordination with [place] #3   Title Patient will write a full sentence with RUE legibly    Time 8    Period Weeks    Status Achieved   pt reports it is better and wrote a sentence in clinic with 100% legibility     OT LONG TERM GOAL #4   Title Patient will reach into overhead cabinet to obtain a lightweight object (less than 3 lb) with RUE    Time 12    Period Weeks    Status On-going   pt with increased pain in RUE shoulder     OT LONG TERM GOAL #5   Title Patient will utilize mallet with RUE to tap to open joints of chair in furniture restoration project    Time 12    Period Weeks    Status On-going   still working towards increasing strength for using mallet     OT LONG TERM GOAL #6   Title Patient will demonstrate understanding of recommendations relating to retunring to driving    Time 12    Period Weeks    Status Achieved                 Plan - 05/27/20 1721    Clinical Impression Statement Pt able to tolerate scapular strengthening today with minimal pain - able to begin to address open chain reaching.    OT Occupational Profile and History Detailed Assessment- Review of Records and additional review of physical, cognitive, psychosocial history related to current functional performance    Occupational performance deficits (Please refer to evaluation for details):  ADL's;IADL's;Work    Body Structure / Function / Physical Skills ADL;Coordination;Endurance;GMC;UE functional use;Balance;Decreased knowledge of precautions;Fascial restriction;Pain;IADL;Flexibility;Decreased knowledge of use of  DME;Body mechanics;Dexterity;FMC;Strength;Tone;ROM    Rehab Potential Excellent    Clinical Decision Making Several treatment options, min-mod task modification necessary    Comorbidities Affecting Occupational Performance: May have comorbidities impacting occupational performance    Modification or Assistance to Complete Evaluation  Min-Moderate modification of tasks or assist with assess necessary to complete eval    OT Frequency 2x / week    OT Duration 12 weeks    OT Treatment/Interventions Self-care/ADL training;Electrical Stimulation;Therapeutic exercise;Aquatic Therapy;Moist Heat;Neuromuscular education;Compression bandaging;Splinting;Patient/family education;Balance training;Therapeutic activities;Functional Mobility Training;Fluidtherapy;Cryotherapy;Ultrasound;Contrast Bath;DME and/or AE instruction;Manual Therapy;Passive range of motion    Plan Need to work in pain free ranges - needs better muscle balance in shoulder and elbow - NMR proximal RUE - Needs scap stabilizers and improved strength/range, Functional use of RUE - for eating, in hand manip, tool use    OT Home Exercise Plan coordination HEP, putty HEP, standing rocking for shoulder ranges    Consulted and Agree with Plan of Care Patient;Family member/caregiver           Patient will benefit from skilled therapeutic intervention in order to improve the following deficits and impairments:   Body Structure / Function / Physical Skills: ADL,Coordination,Endurance,GMC,UE functional use,Balance,Decreased knowledge of precautions,Fascial restriction,Pain,IADL,Flexibility,Decreased knowledge of use of DME,Body mechanics,Dexterity,FMC,Strength,Tone,ROM       Visit Diagnosis: Hemiplegia and hemiparesis following cerebral infarction affecting right dominant side (HCC)  Unsteadiness on feet  Pain in right wrist  Abnormal posture  Muscle weakness (generalized)  Localized edema    Problem List Patient Active Problem List    Diagnosis Date Noted  . Spasticity as late effect of cerebrovascular accident (CVA) 04/22/2020  . Elevated BUN   . Sleep disturbance   . Infarction of left basal ganglia ([place]) 02/21/2020  . Right hemiparesis ([place])   . Leukocytosis   . Sinus tachycardia   . Dyslipidemia   . History of gout   . Left sided lacunar infarction ([place]) 02/16/2020  . Chronic hepatitis C without hepatic coma ([place]) 01/20/2016  . Lumbar disc disease with radiculopathy 03/07/2012  . Essential hypertension 12/14/2009  . RHINITIS 03/26/2008  . Gout 01/15/2007  . ERECTILE DYSFUNCTION 01/15/2007  . CARPAL TUNNEL SYNDROME, RIGHT 01/15/2007    Mariah Milling, OTR/L 05/27/2020, 5:24 PM  [facility] [address] [place], [place], [address] Phone: [phone]   Fax:  [phone]  Name: Brent Petrucelli Sr. MRN: [id number] Date of Birth: 1950/07/21

## 2020-05-27 NOTE — Patient Instructions (Signed)
Access Code: [email or web address] URL: [email or web address] Date: 05/27/2020 Prepared by: Cherly Anderson  Exercises Walking March - 2 x daily - 7 x weekly - 1 sets - 3-4 reps Backward Walking with Counter Support - 2 x daily - 7 x weekly - 1 sets - 3-4 reps Standing Single Leg Stance with Counter Support - 1 x daily - 7 x weekly - 1 sets - 3 reps - 20-30 sec hold Sit to Stand with Right Leg Back - stand on pillow - 1 x daily - 7 x weekly - 2 sets - 10 reps Lateral Step Up - 1 x daily - 7 x weekly - 2 sets - 10 reps Reverse Band Walks - 2 x daily - 5 x weekly - 1 sets - 2-3 reps Band Walks - 2 x daily - 5 x weekly - 1 sets - 2-3 reps Supine Lower Trunk Rotation - 2 x daily - 7 x weekly - 1 sets - 4 reps - 10 sec hold

## 2020-05-27 NOTE — Therapy (Signed)
[facility] [address] [place], [place], [address] Phone: [phone]   Fax:  [phone]  Physical Therapy Treatment  Patient Details  Name: Brent Sackmann Sr. MRN: [id number] Date of Birth: 1950/03/22 Referring Provider (PT): Cathlyn Parsons, PA-C   Encounter Date: 05/27/2020   PT End of Session - 05/27/20 1525    Visit Number 15    Number of Visits 20    Date for PT Re-Evaluation 53/29/92   8 week cert, 6 week poc   Authorization Type [employer]; $20 copay, medicare secondary also    PT Start Time 1525    PT Stop Time 1614    PT Time Calculation (min) 49 min    Activity Tolerance Patient tolerated treatment well    Behavior During Therapy WFL for tasks assessed/performed           Past Medical History:  Diagnosis Date  . Carpal tunnel syndrome   . Gout   . Hyperlipidemia    on meds  . Hypertension    on meds    Past Surgical History:  Procedure Laterality Date  . COLONOSCOPY  2015   DJ-MAC-polyps  . LOOP RECORDER INSERTION N/A 02/19/2020   Procedure: LOOP RECORDER INSERTION;  Surgeon: Constance Haw, MD;  Location: [facility];  Service: Cardiovascular;  Laterality: N/A;  . no prior surgery    . POLYPECTOMY  2015   DJ-MAC-polyps    There were no vitals filed for this visit.   Subjective Assessment - 05/27/20 1525    Subjective Pt reports that back is doing much better. Only noticed a little soreness after being in the shop awhile this weekend. Shoulder still is most difficult area.    Patient is accompained by: Family member    Pertinent History carpal tunnel, HTN, HLD, DM, gout, and former smoker.    Patient Stated Goals Increase use of RUE; walk without the walker.    Currently in Pain? Yes    Pain Score 0-No pain   4/10 with movement   Pain Location Shoulder    Pain Orientation Right    Pain Descriptors / Indicators Aching    Pain Type Acute pain    Pain Onset 1 to 4 weeks  ago    Pain Frequency Intermittent    Aggravating Factors  use    Pain Relieving Factors rest                             [facility] Adult PT Treatment/Exercise - 05/27/20 1527      Ambulation/Gait   Ambulation/Gait Yes    Ambulation/Gait Assistance 6: Modified independent (Device/Increase time)    Ambulation/Gait Assistance Details Performed after working on getting more pelvic rotation inside. Verbal cues to relax arms for more arm swing and try to get more pelvic/trunk rotation loosing up some.    Ambulation Distance (Feet) 850 Feet    Assistive device None    Gait Pattern Step-through pattern;Decreased arm swing - right    Ambulation Surface Level;Unlevel;Indoor;Outdoor;Paved;Grass      Neuro Re-ed    Neuro Re-ed Details  Gait with PT facilitating pelvic rotation x 115' then 230' with tactile cues at ASIS to push in to . Resisted gait at [employer]: forward and back 15' x 6 each with verbal cues to control steps and engage core, step ups on 6" step x 10 each leg with opposite leg hip flexion and stepping back  to increase SLS time. Pt more challenged with SLS on right CGA. With posterior resisted gait circles around cone x 5 CW and x 5 CCW. Step-ups on 2x4 foam beam x 10 each leg and getting balance each time. Standing on rockerboard positioned ant/post maintaining level x 30 sec eyes open and x 30 sec eyes closed then alternating toe taps on cone x 10.      Exercises   Exercises Other Exercises    Other Exercises  Hooklying trunk rotation x 5 to each side holding 10 sec, bridges with bringing one side up at a time and one side down at a time to get more pelvic rotation with verbal cues to engage core x 10.                  PT Education - 05/27/20 1938    Education Details Added trunk rotation and bridging isolating pelvic movements one side up at a time and one side down at a time.    Person(s) Educated Patient    Methods Explanation;Demonstration;Handout     Comprehension Verbalized understanding            PT Short Term Goals - 05/21/20 0901      PT SHORT TERM GOAL #1   Title Pt will be instructed in ways to manage low back pain including stretching to improve flexibility with reports of <4/10 pain after activities.    Baseline 5/10 with activities >1 hour.    Time 3    Period Weeks    Status New    Target Date 06/11/20             PT Long Term Goals - 05/21/20 0902      PT LONG TERM GOAL #1   Title Pt will be independent with final HEP  (ALL LTG DUE BY 05/16/20)    Baseline 05/20/20 Pt doing well with current HEP. PT continues to update.    Time 6    Period Weeks    Status On-going    Target Date 07/02/20      PT LONG TERM GOAL #2   Title Pt will report overall improvement in function on FOTO to >/= 64%    Baseline 50%. continuing with PT    Time 6    Period Weeks    Status On-going    Target Date 07/02/20      PT LONG TERM GOAL #3   Title Pt will ambulate on varied surfaces >1000' without AD independently with noted improved trunk rotation, arm swing and right toe off.    Baseline 05/20/20 850' supervision on varied surfaces more for cuing, mod I with steps with 1 rail.    Time 6    Period Weeks    Status New    Target Date 07/02/20      PT LONG TERM GOAL #4   Title Pt will increase SLS time to >7 sec bilateral for improved balance.    Baseline 05/20/20 5 sec bilateral    Time 6    Period Weeks    Status New    Target Date 07/02/20      PT LONG TERM GOAL #5   Title Pt will increase FGA from 21 to >24/30 for improved balance and gait safety.    Baseline 05/20/20 21/30    Time 6    Period Weeks    Status New    Target Date 07/02/20  Plan - 05/27/20 1939    Clinical Impression Statement Pt reported back doing better than last week. He was able to demonstrate more trunk rotation with gait after activities today.    Personal Factors and Comorbidities Comorbidity 3+;Profession    Comorbidities  carpal tunnel, HTN, HLD, DM, gout, and former smoker    Examination-Activity Limitations Locomotion Level;Transfers;Stairs    Examination-Participation Restrictions Driving;Occupation;Community Activity    Stability/Clinical Decision Making Evolving/Moderate complexity    Rehab Potential Good    PT Frequency 1x / week    PT Duration 6 weeks    PT Treatment/Interventions ADLs/Self Care Home Management;Aquatic Therapy;Cryotherapy;Electrical Stimulation;Moist Heat;DME Instruction;Gait training;Stair training;Functional mobility training;Therapeutic activities;Therapeutic exercise;Balance training;Neuromuscular re-education;Patient/family education;Orthotic Fit/Training;Manual techniques;Passive range of motion    PT Next Visit Plan How's back doing? Continue to work on improving trunk rotation with gait. continue dynamic gait activities without AD. Working on increasing right toe off/ stance time - treadmill with incline, trampoline, etc.. Hip ext exercises.  Continue RLE strengthening and balance training. Rockerboard activities. SLS activities.    Consulted and Agree with Plan of Care Patient;Family member/caregiver    Family Member Consulted wife           Patient will benefit from skilled therapeutic intervention in order to improve the following deficits and impairments:  Abnormal gait,Decreased balance,Decreased strength,Pain  Visit Diagnosis: Other abnormalities of gait and mobility  Muscle weakness (generalized)     Problem List Patient Active Problem List   Diagnosis Date Noted  . Spasticity as late effect of cerebrovascular accident (CVA) 04/22/2020  . Elevated BUN   . Sleep disturbance   . Infarction of left basal ganglia ([place]) 02/21/2020  . Right hemiparesis ([place])   . Leukocytosis   . Sinus tachycardia   . Dyslipidemia   . History of gout   . Left sided lacunar infarction ([place]) 02/16/2020  . Chronic hepatitis C without hepatic coma ([place]) 01/20/2016  . Lumbar disc  disease with radiculopathy 03/07/2012  . Essential hypertension 12/14/2009  . RHINITIS 03/26/2008  . Gout 01/15/2007  . ERECTILE DYSFUNCTION 01/15/2007  . CARPAL TUNNEL SYNDROME, RIGHT 01/15/2007    Electa Sniff, PT, DPT, NCS 05/27/2020, 7:42 PM  [facility] [address] [place], [place], [address] Phone: [phone]   Fax:  [phone]  Name: Brent Flemister Sr. MRN: [id number] Date of Birth: [birth year]

## 2020-06-01 ENCOUNTER — Other Ambulatory Visit: Payer: Self-pay

## 2020-06-01 ENCOUNTER — Encounter: Payer: Self-pay | Admitting: Occupational Therapy

## 2020-06-01 ENCOUNTER — Ambulatory Visit: Payer: 59 | Admitting: Occupational Therapy

## 2020-06-01 DIAGNOSIS — R2689 Other abnormalities of gait and mobility: Secondary | ICD-10-CM | POA: Diagnosis not present

## 2020-06-01 DIAGNOSIS — R293 Abnormal posture: Secondary | ICD-10-CM | POA: Diagnosis not present

## 2020-06-01 DIAGNOSIS — M25531 Pain in right wrist: Secondary | ICD-10-CM | POA: Diagnosis not present

## 2020-06-01 DIAGNOSIS — M6281 Muscle weakness (generalized): Secondary | ICD-10-CM | POA: Diagnosis not present

## 2020-06-01 DIAGNOSIS — R2681 Unsteadiness on feet: Secondary | ICD-10-CM | POA: Diagnosis not present

## 2020-06-01 DIAGNOSIS — I69351 Hemiplegia and hemiparesis following cerebral infarction affecting right dominant side: Secondary | ICD-10-CM | POA: Diagnosis not present

## 2020-06-01 DIAGNOSIS — R600 Localized edema: Secondary | ICD-10-CM | POA: Diagnosis not present

## 2020-06-01 NOTE — Therapy (Signed)
[facility] [address] [place], [place], [address] Phone: [phone]   Fax:  [phone]  Occupational Therapy Treatment  Patient Details  Name: Brent Markwood Sr. MRN: [id number] Date of Birth: [birth year] Referring Provider (OT): Mar[place]e [place]   Encounter Date: 06/01/2020   OT End of Session - 06/01/20 1853    Visit Number 19    Number of Visits 25    Date for OT Re-Evaluation 06/16/20    Authorization Type [employer] [facility]    Authorization Time Period week 11 of 12 (5/16)    Progress Note Due on Visit 20    OT Start Time 1545    OT Stop Time 1630    OT Time Calculation (min) 45 min    Activity Tolerance Patient tolerated treatment well    Behavior During Therapy WFL for tasks assessed/performed           Past Medical History:  Diagnosis Date  . Carpal tunnel syndrome   . Gout   . Hyperlipidemia    on meds  . Hypertension    on meds    Past Surgical History:  Procedure Laterality Date  . COLONOSCOPY  2015   DJ-MAC-polyps  . LOOP RECORDER INSERTION N/A 02/19/2020   Procedure: LOOP RECORDER INSERTION;  Surgeon: Constance Haw, MD;  Location: [facility];  Service: Cardiovascular;  Laterality: N/A;  . no prior surgery    . POLYPECTOMY  2015   DJ-MAC-polyps    There were no vitals filed for this visit.   Subjective Assessment - 06/01/20 1852    Subjective  I have not had any shoulder pain since last week!    Currently in Pain? No/denies    Pain Score 0-No pain              Patient seen for aquatic therapy.  Patient entered and exited water via stairs and step through pattenr using two handrails.  Patient's treatment occurred in 3.5-4.5 ft of water.  Using principles of buoyance assisted and resisted movement - worked on isolated control of shoulder and elbow muscles - for shoulder flex/ext, abd/add, elbow flex/ext.  Also addressed standing trunk rotation.  Worked on patient's ability to  move RUE behind body - able to do so today with resistance and without pain.   Needs continued work on hand strengthening to allow him to return to tasks like touch up painting and tool use in workshop.                      OT Short Term Goals - 06/01/20 1855      OT SHORT TERM GOAL #1   Title Patient will complete a home exercise program designed to improve RUE range of motion    Period Weeks    Status Achieved    Target Date 05/02/20      OT SHORT TERM GOAL #2   Title Patient will utilize RUE to grasp release feeding utensil with min assist    Time 4    Period Weeks    Status Achieved      OT SHORT TERM GOAL #3   Title Patient will tie shoes bimanually with increased time and min assist    Time 4    Period Weeks    Status Achieved      OT SHORT TERM GOAL #4   Title Patient will zipper light weight jacket, or button 2 buttons on shirt with increased time and  min assist    Time 4    Period Weeks    Status Achieved      OT SHORT TERM GOAL #5   Title Patient will utilize BUE to wash a lightweight dish while standing at sink    Time 4    Period Weeks    Status Achieved             OT Long Term Goals - 06/01/20 1855      OT LONG TERM GOAL #1   Title Patient will complete and updated HEP to address RUE ROM and functional strength    Time 12    Period Weeks    Status On-going   continue to add PRN     OT LONG TERM GOAL #2   Title Patient will utilize BUE to weave chair seating (caning) with increased time and intermittent support    Time 12    Period Weeks    Status On-going   has not tried yet - will continue to work towards coordination with [place] #3   Title Patient will write a full sentence with RUE legibly    Time 8    Period Weeks    Status Achieved   pt reports it is better and wrote a sentence in clinic with 100% legibility     OT LONG TERM GOAL #4   Title Patient will reach into overhead cabinet to obtain a  lightweight object (less than 3 lb) with RUE    Time 12    Period Weeks    Status On-going   pt with increased pain in RUE shoulder     OT LONG TERM GOAL #5   Title Patient will utilize mallet with RUE to tap to open joints of chair in furniture restoration project    Time 12    Period Weeks    Status On-going   still working towards increasing strength for using mallet     OT LONG TERM GOAL #6   Title Patient will demonstrate understanding of recommendations relating to retunring to driving    Time 12    Period Weeks    Status Achieved                 Plan - 06/01/20 1854    Clinical Impression Statement Pt finally reporting less pain in right shoulder, and he is actively using arm where he can functionally in pain free ranges.    OT Occupational Profile and History Detailed Assessment- Review of Records and additional review of physical, cognitive, psychosocial history related to current functional performance    Occupational performance deficits (Please refer to evaluation for details): ADL's;IADL's;Work    Body Structure / Function / Physical Skills ADL;Coordination;Endurance;GMC;UE functional use;Balance;Decreased knowledge of precautions;Fascial restriction;Pain;IADL;Flexibility;Decreased knowledge of use of DME;Body mechanics;Dexterity;FMC;Strength;Tone;ROM    Rehab Potential Excellent    Clinical Decision Making Several treatment options, min-mod task modification necessary    Comorbidities Affecting Occupational Performance: May have comorbidities impacting occupational performance    Modification or Assistance to Complete Evaluation  Min-Moderate modification of tasks or assist with assess necessary to complete eval    OT Frequency 2x / week    OT Duration 12 weeks    OT Treatment/Interventions Self-care/ADL training;Electrical Stimulation;Therapeutic exercise;Aquatic Therapy;Moist Heat;Neuromuscular education;Compression bandaging;Splinting;Patient/family  education;Balance training;Therapeutic activities;Functional Mobility Training;Fluidtherapy;Cryotherapy;Ultrasound;Contrast Bath;DME and/or AE instruction;Manual Therapy;Passive range of motion    Plan Need to work in pain free ranges - needs better muscle  balance in shoulder and elbow - NMR proximal RUE - Needs scap stabilizers and improved strength/range, Functional use of RUE - for eating, in hand manip, tool use    OT Home Exercise Plan coordination HEP, putty HEP, standing rocking for shoulder ranges    Consulted and Agree with Plan of Care Patient;Family member/caregiver           Patient will benefit from skilled therapeutic intervention in order to improve the following deficits and impairments:   Body Structure / Function / Physical Skills: ADL,Coordination,Endurance,GMC,UE functional use,Balance,Decreased knowledge of precautions,Fascial restriction,Pain,IADL,Flexibility,Decreased knowledge of use of DME,Body mechanics,Dexterity,FMC,Strength,Tone,ROM       Visit Diagnosis: Hemiplegia and hemiparesis following cerebral infarction affecting right dominant side (HCC)  Unsteadiness on feet  Abnormal posture  Localized edema  Pain in right wrist  Muscle weakness (generalized)    Problem List Patient Active Problem List   Diagnosis Date Noted  . Spasticity as late effect of cerebrovascular accident (CVA) 04/22/2020  . Elevated BUN   . Sleep disturbance   . Infarction of left basal ganglia ([place]) 02/21/2020  . Right hemiparesis ([place])   . Leukocytosis   . Sinus tachycardia   . Dyslipidemia   . History of gout   . Left sided lacunar infarction ([place]) 02/16/2020  . Chronic hepatitis C without hepatic coma ([place]) 01/20/2016  . Lumbar disc disease with radiculopathy 03/07/2012  . Essential hypertension 12/14/2009  . RHINITIS 03/26/2008  . Gout 01/15/2007  . ERECTILE DYSFUNCTION 01/15/2007  . CARPAL TUNNEL SYNDROME, RIGHT 01/15/2007    Mariah Milling 06/01/2020, 6:56  PM  [facility] [address] [place], [place], [address] Phone: [phone]   Fax:  [phone]  Name: Brent Delair Sr. MRN: [id number] Date of Birth: [birth year]

## 2020-06-01 NOTE — Patient Outreach (Signed)
[facility]) Care Management  06/01/2020  Miqueas Whilden Sr. 1950-07-14 [id number]   Second telephone outreach attempt to obtain mRS. No answer. Left message for returned call.  [place] [facility] Management Assistant [phone]

## 2020-06-03 ENCOUNTER — Ambulatory Visit: Payer: 59 | Admitting: Occupational Therapy

## 2020-06-03 ENCOUNTER — Other Ambulatory Visit: Payer: Self-pay

## 2020-06-03 ENCOUNTER — Encounter: Payer: Self-pay | Admitting: Occupational Therapy

## 2020-06-03 DIAGNOSIS — R2681 Unsteadiness on feet: Secondary | ICD-10-CM

## 2020-06-03 DIAGNOSIS — R600 Localized edema: Secondary | ICD-10-CM | POA: Diagnosis not present

## 2020-06-03 DIAGNOSIS — I69351 Hemiplegia and hemiparesis following cerebral infarction affecting right dominant side: Secondary | ICD-10-CM

## 2020-06-03 DIAGNOSIS — M6281 Muscle weakness (generalized): Secondary | ICD-10-CM | POA: Diagnosis not present

## 2020-06-03 DIAGNOSIS — M25531 Pain in right wrist: Secondary | ICD-10-CM

## 2020-06-03 DIAGNOSIS — R293 Abnormal posture: Secondary | ICD-10-CM | POA: Diagnosis not present

## 2020-06-03 DIAGNOSIS — R2689 Other abnormalities of gait and mobility: Secondary | ICD-10-CM | POA: Diagnosis not present

## 2020-06-03 NOTE — Therapy (Signed)
[facility] [address] [place], [place], [address] Phone: [phone]   Fax:  [phone]  Occupational Therapy Treatment and Progress Update  Patient Details  Name: Brent Dubuque Sr. MRN: [id number] Date of Birth: Feb 08, 1950 Referring Provider (OT): Marlowe Shores  This progress note covers dates of service from 05/05/20- 06/03/20 Encounter Date: 06/03/2020   OT End of Session - 06/03/20 1527    Visit Number 20    Number of Visits 25    Date for OT Re-Evaluation 06/16/20    Authorization Type [employer] [facility]    Authorization Time Period week 11 of 12 (5/16)    Progress Note Due on Visit 20    OT Start Time 1400    OT Stop Time 1445    OT Time Calculation (min) 45 min    Activity Tolerance Patient tolerated treatment well    Behavior During Therapy WFL for tasks assessed/performed           Past Medical History:  Diagnosis Date  . Carpal tunnel syndrome   . Gout   . Hyperlipidemia    on meds  . Hypertension    on meds    Past Surgical History:  Procedure Laterality Date  . COLONOSCOPY  2015   DJ-MAC-polyps  . LOOP RECORDER INSERTION N/A 02/19/2020   Procedure: LOOP RECORDER INSERTION;  Surgeon: Constance Haw, MD;  Location: [facility];  Service: Cardiovascular;  Laterality: N/A;  . no prior surgery    . POLYPECTOMY  2015   DJ-MAC-polyps    There were no vitals filed for this visit.   Subjective Assessment - 06/03/20 1520    Subjective  I worked out in the shop yesterday - used my drill    Currently in Pain? No/denies    Pain Score 0-No pain                        OT Treatments/Exercises (OP) - 06/03/20 0001      Neurological Re-education Exercises   Other Exercises 1 neuromuscular reeducation to address mid to high reach.  Modified plantigrade to address weight through UE's and arm/trunk relationship.  Working on improving alignment of scapula on thorax.  Patient with mild  report of discomfort in right thmb - improved with weight shifted toward right side to load ulnar side of hand.  Transitioned to quadruped to address even more weight bearing and alignment for reach in RUE - working to increase ability to drop humeral head as distal humerus elevates.  Followed with active reach and return lightweight object in shelf at eye level.    Other Exercises 2 Working distal forearm, wrist and hand as needed for furniture restoration. Patient struggling with aerosol can - pressure with index finger - upgraded putty to orange and addressed index finger strengthening.  Discussed stabilizing forearm on table or support, or aerosol can on table to ease pressure required.                  OT Education - 06/03/20 1527    Education Details index finger strengthening - upgraded putty to orange    Person(s) Educated Patient    Methods Explanation;Demonstration;Tactile cues;Verbal cues    Comprehension Verbalized understanding;Returned demonstration            OT Short Term Goals - 06/03/20 1530      OT SHORT TERM GOAL #1   Title Patient will complete a home exercise program designed  to improve RUE range of motion    Period Weeks    Status Achieved    Target Date 05/02/20      OT SHORT TERM GOAL #2   Title Patient will utilize RUE to grasp release feeding utensil with min assist    Time 4    Period Weeks    Status Achieved      OT SHORT TERM GOAL #3   Title Patient will tie shoes bimanually with increased time and min assist    Time 4    Period Weeks    Status Achieved      OT SHORT TERM GOAL #4   Title Patient will zipper light weight jacket, or button 2 buttons on shirt with increased time and min assist    Time 4    Period Weeks    Status Achieved      OT SHORT TERM GOAL #5   Title Patient will utilize BUE to wash a lightweight dish while standing at sink    Time 4    Period Weeks    Status Achieved             OT Long Term Goals - 06/03/20  1530      OT LONG TERM GOAL #1   Title Patient will complete and updated HEP to address RUE ROM and functional strength    Time 12    Period Weeks    Status On-going   continue to add PRN     OT LONG TERM GOAL #2   Title Patient will utilize BUE to weave chair seating (caning) with increased time and intermittent support    Time 12    Period Weeks    Status On-going   has not tried yet - will continue to work towards coordination with [place] #3   Title Patient will write a full sentence with RUE legibly    Time 8    Period Weeks    Status Achieved   pt reports it is better and wrote a sentence in clinic with 100% legibility     OT LONG TERM GOAL #4   Title Patient will reach into overhead cabinet to obtain a lightweight object (less than 3 lb) with RUE    Time 12    Period Weeks    Status On-going   pt with increased pain in RUE shoulder     OT LONG TERM GOAL #5   Title Patient will utilize mallet with RUE to tap to open joints of chair in furniture restoration project    Time 12    Period Weeks    Status On-going   still working towards increasing strength for using mallet     OT LONG TERM GOAL #6   Title Patient will demonstrate understanding of recommendations relating to retunring to driving    Time 12    Period Weeks    Status Achieved                 Plan - 06/03/20 1527    Clinical Impression Statement Pt with continued report of increasing function in dominant RUE    OT Occupational Profile and History Detailed Assessment- Review of Records and additional review of physical, cognitive, psychosocial history related to current functional performance    Occupational performance deficits (Please refer to evaluation for details): ADL's;IADL's;Work    Body Structure / Function / Physical Skills ADL;Coordination;Endurance;GMC;UE functional use;Balance;Decreased knowledge of precautions;Fascial  restriction;Pain;IADL;Flexibility;Decreased knowledge  of use of DME;Body mechanics;Dexterity;FMC;Strength;Tone;ROM    Rehab Potential Excellent    Clinical Decision Making Several treatment options, min-mod task modification necessary    Comorbidities Affecting Occupational Performance: May have comorbidities impacting occupational performance    Modification or Assistance to Complete Evaluation  Min-Moderate modification of tasks or assist with assess necessary to complete eval    OT Frequency 2x / week    OT Duration 12 weeks    OT Treatment/Interventions Self-care/ADL training;Electrical Stimulation;Therapeutic exercise;Aquatic Therapy;Moist Heat;Neuromuscular education;Compression bandaging;Splinting;Patient/family education;Balance training;Therapeutic activities;Functional Mobility Training;Fluidtherapy;Cryotherapy;Ultrasound;Contrast Bath;DME and/or AE instruction;Manual Therapy;Passive range of motion    Plan Need to work in pain free ranges - needs better muscle balance in shoulder and elbow - NMR proximal RUE - Needs scap stabilizers and improved strength/range, Functional use of RUE - for eating, in hand manip, tool use    OT Home Exercise Plan coordination HEP, putty HEP, standing rocking for shoulder ranges    Consulted and Agree with Plan of Care Patient;Family member/caregiver           Patient will benefit from skilled therapeutic intervention in order to improve the following deficits and impairments:   Body Structure / Function / Physical Skills: ADL,Coordination,Endurance,GMC,UE functional use,Balance,Decreased knowledge of precautions,Fascial restriction,Pain,IADL,Flexibility,Decreased knowledge of use of DME,Body mechanics,Dexterity,FMC,Strength,Tone,ROM       Visit Diagnosis: Hemiplegia and hemiparesis following cerebral infarction affecting right dominant side (HCC)  Abnormal posture  Localized edema  Pain in right wrist  Muscle weakness (generalized)  Unsteadiness on feet    Problem List Patient Active  Problem List   Diagnosis Date Noted  . Spasticity as late effect of cerebrovascular accident (CVA) 04/22/2020  . Elevated BUN   . Sleep disturbance   . Infarction of left basal ganglia ([facility]) 02/21/2020  . Right hemiparesis ([facility])   . Leukocytosis   . Sinus tachycardia   . Dyslipidemia   . History of gout   . Left sided lacunar infarction ([facility]) 02/16/2020  . Chronic hepatitis C without hepatic coma ([facility]) 01/20/2016  . Lumbar disc disease with radiculopathy 03/07/2012  . Essential hypertension 12/14/2009  . RHINITIS 03/26/2008  . Gout 01/15/2007  . ERECTILE DYSFUNCTION 01/15/2007  . CARPAL TUNNEL SYNDROME, RIGHT 01/15/2007    Mariah Milling 06/03/2020, 4:56 PM  [facility] [address] [place], [place], [address] Phone: [phone]   Fax:  [phone]  Name: Kylen Schliep Sr. MRN: [id number] Date of Birth: 1950-11-24

## 2020-06-04 ENCOUNTER — Other Ambulatory Visit: Payer: Self-pay

## 2020-06-04 NOTE — Patient Outreach (Signed)
[facility]) Care Management  06/04/2020  Brent Mccormick Sr. July 23, 1950 [id number]   3 outreach attempts were completed to obtain mRs. mRs could not be obtained because patient never returned my calls. mRs=7    [facility] Management Assistant [phone]

## 2020-06-08 ENCOUNTER — Ambulatory Visit: Payer: 59 | Admitting: Occupational Therapy

## 2020-06-08 ENCOUNTER — Ambulatory Visit (INDEPENDENT_AMBULATORY_CARE_PROVIDER_SITE_OTHER): Payer: 59

## 2020-06-08 DIAGNOSIS — I639 Cerebral infarction, unspecified: Secondary | ICD-10-CM

## 2020-06-09 ENCOUNTER — Other Ambulatory Visit (HOSPITAL_COMMUNITY): Payer: Self-pay

## 2020-06-10 ENCOUNTER — Ambulatory Visit: Payer: 59 | Admitting: Occupational Therapy

## 2020-06-10 ENCOUNTER — Ambulatory Visit: Payer: 59

## 2020-06-10 ENCOUNTER — Encounter: Payer: Self-pay | Admitting: Occupational Therapy

## 2020-06-10 ENCOUNTER — Other Ambulatory Visit: Payer: Self-pay

## 2020-06-10 DIAGNOSIS — M6281 Muscle weakness (generalized): Secondary | ICD-10-CM

## 2020-06-10 DIAGNOSIS — R600 Localized edema: Secondary | ICD-10-CM | POA: Diagnosis not present

## 2020-06-10 DIAGNOSIS — I69351 Hemiplegia and hemiparesis following cerebral infarction affecting right dominant side: Secondary | ICD-10-CM

## 2020-06-10 DIAGNOSIS — R293 Abnormal posture: Secondary | ICD-10-CM | POA: Diagnosis not present

## 2020-06-10 DIAGNOSIS — M25531 Pain in right wrist: Secondary | ICD-10-CM | POA: Diagnosis not present

## 2020-06-10 DIAGNOSIS — R2689 Other abnormalities of gait and mobility: Secondary | ICD-10-CM | POA: Diagnosis not present

## 2020-06-10 DIAGNOSIS — R2681 Unsteadiness on feet: Secondary | ICD-10-CM | POA: Diagnosis not present

## 2020-06-10 NOTE — Therapy (Signed)
[facility] [address] [place], [place], [address] Phone: [phone]   Fax:  [phone]  Physical Therapy Treatment  Patient Details  Name: Brent Masso Sr. MRN: [id number] Date of Birth: March 30, 1950 Referring Provider (PT): Cathlyn Parsons, PA-C   Encounter Date: 06/10/2020   PT End of Session - 06/10/20 1318    Visit Number 16    Number of Visits 20    Date for PT Re-Evaluation 85/46/27   8 week cert, 6 week poc   Authorization Type [employer]; $20 copay, medicare secondary also    PT Start Time 1316    PT Stop Time 1359    PT Time Calculation (min) 43 min    Activity Tolerance Patient tolerated treatment well    Behavior During Therapy WFL for tasks assessed/performed           Past Medical History:  Diagnosis Date  . Carpal tunnel syndrome   . Gout   . Hyperlipidemia    on meds  . Hypertension    on meds    Past Surgical History:  Procedure Laterality Date  . COLONOSCOPY  2015   DJ-MAC-polyps  . LOOP RECORDER INSERTION N/A 02/19/2020   Procedure: LOOP RECORDER INSERTION;  Surgeon: Constance Haw, MD;  Location: [facility];  Service: Cardiovascular;  Laterality: N/A;  . no prior surgery    . POLYPECTOMY  2015   DJ-MAC-polyps    There were no vitals filed for this visit.   Subjective Assessment - 06/10/20 1318    Subjective Pt reports that back is still doing better. Has been able to work out in shop and is breaking up activities as needed.    Patient is accompained by: Family member    Pertinent History carpal tunnel, HTN, HLD, DM, gout, and former smoker.    Patient Stated Goals Increase use of RUE; walk without the walker.    Currently in Pain? Yes    Pain Score 2    increases with raising arm overhead   Pain Location Shoulder    Pain Orientation Right    Pain Descriptors / Indicators Aching    Pain Type Chronic pain    Pain Onset 1 to 4 weeks ago    Pain Frequency  Intermittent    Aggravating Factors  raising overhead    Pain Relieving Factors rest                             [facility] Adult PT Treatment/Exercise - 06/10/20 1320      Ambulation/Gait   Ambulation/Gait Yes    Ambulation/Gait Assistance 6: Modified independent (Device/Increase time);5: Supervision    Ambulation/Gait Assistance Details First bout PT had blue theraband around pelvis applying posterior resistance to try to get more pelvic rotation and weight shift. 2nd bout no resistance just verbal cues to relax arms and try to get more rotation with longer steps.    Ambulation Distance (Feet) 230 Feet   230' x 1   Assistive device None    Gait Pattern Step-through pattern;Decreased step length - right    Ambulation Surface Level;Indoor    Gait Comments Pt ambulated on treadmill x 5 min at 1.72mh with verbal cues to try to increase step length and foot clearance. Pt held on to bar throughout. 5/10 RPE. BP=132/82 after.      Neuro Re-ed    Neuro Re-ed Details  In // bars: step-ups on  rockerboard positioned ant/post with RLE x 10 without UE support, step up on rockerboard with RLE then tapping cone in front with LLE and back x 10. Rockerboard positioned lateral: trying to maintain level x 30 sec CGA with slight increased sway eyes open then performed eyes closed x 30 sec with significant increase in sway and having to touch a couple times, maintaining level with head turns left/right x 10, maintaining level with raising 2.2# med ball overhead on left x 10 the D1 diagonals with ball x 10 with left. Pt was cued to try to engage core to help with stability which did help some. Forward partial lunge with 1 UE support with blue theraband resistance posterior at pelvis x 10 each leg with verbal cues to keep trunk upright. Sit to stand x 10 from edge of low mat with feet on soft foam beam. Verbal cues to not brace legs on mat when rising.                    PT Short Term Goals  - 06/10/20 1359      PT SHORT TERM GOAL #1   Title Pt will be instructed in ways to manage low back pain including stretching to improve flexibility with reports of <4/10 pain after activities.    Baseline 5/10 with activities >1 hour. 06/10/20 no back pain currently. The worst it has been in past week is 3/10    Time 3    Period Weeks    Status Achieved    Target Date 06/11/20             PT Long Term Goals - 05/21/20 0902      PT LONG TERM GOAL #1   Title Pt will be independent with final HEP  (ALL LTG DUE BY 05/16/20)    Baseline 05/20/20 Pt doing well with current HEP. PT continues to update.    Time 6    Period Weeks    Status On-going    Target Date 07/02/20      PT LONG TERM GOAL #2   Title Pt will report overall improvement in function on FOTO to >/= 64%    Baseline 50%. continuing with PT    Time 6    Period Weeks    Status On-going    Target Date 07/02/20      PT LONG TERM GOAL #3   Title Pt will ambulate on varied surfaces >1000' without AD independently with noted improved trunk rotation, arm swing and right toe off.    Baseline 05/20/20 850' supervision on varied surfaces more for cuing, mod I with steps with 1 rail.    Time 6    Period Weeks    Status New    Target Date 07/02/20      PT LONG TERM GOAL #4   Title Pt will increase SLS time to >7 sec bilateral for improved balance.    Baseline 05/20/20 5 sec bilateral    Time 6    Period Weeks    Status New    Target Date 07/02/20      PT LONG TERM GOAL #5   Title Pt will increase FGA from 21 to >24/30 for improved balance and gait safety.    Baseline 05/20/20 21/30    Time 6    Period Weeks    Status New    Target Date 07/02/20  Plan - 06/10/20 1408    Clinical Impression Statement PT continued to work on increasing step length, more push off and trunk rotation with gait and NMR activities today. Pt was able to demonstrate better arm swing and rotation by end. Pt fatigued quickly on  treadmill.    Personal Factors and Comorbidities Comorbidity 3+;Profession    Comorbidities carpal tunnel, HTN, HLD, DM, gout, and former smoker    Examination-Activity Limitations Locomotion Level;Transfers;Stairs    Examination-Participation Restrictions Driving;Occupation;Community Activity    Stability/Clinical Decision Making Evolving/Moderate complexity    Rehab Potential Good    PT Frequency 1x / week    PT Duration 6 weeks    PT Treatment/Interventions ADLs/Self Care Home Management;Aquatic Therapy;Cryotherapy;Electrical Stimulation;Moist Heat;DME Instruction;Gait training;Stair training;Functional mobility training;Therapeutic activities;Therapeutic exercise;Balance training;Neuromuscular re-education;Patient/family education;Orthotic Fit/Training;Manual techniques;Passive range of motion    PT Next Visit Plan How's back doing? Continue to work on improving trunk rotation with gait. continue dynamic gait activities without AD. Working on increasing right toe off/ stance time - treadmill with incline, trampoline, etc.. Hip ext exercises.  Continue RLE strengthening and balance training. Rockerboard activities. SLS activities.    Consulted and Agree with Plan of Care Patient;Family member/caregiver    Family Member Consulted wife           Patient will benefit from skilled therapeutic intervention in order to improve the following deficits and impairments:  Abnormal gait,Decreased balance,Decreased strength,Pain  Visit Diagnosis: Other abnormalities of gait and mobility  Muscle weakness (generalized)     Problem List Patient Active Problem List   Diagnosis Date Noted  . Spasticity as late effect of cerebrovascular accident (CVA) 04/22/2020  . Elevated BUN   . Sleep disturbance   . Infarction of left basal ganglia ([facility]) 02/21/2020  . Right hemiparesis ([facility])   . Leukocytosis   . Sinus tachycardia   . Dyslipidemia   . History of gout   . Left sided lacunar infarction ([facility])  02/16/2020  . Chronic hepatitis C without hepatic coma ([facility]) 01/20/2016  . Lumbar disc disease with radiculopathy 03/07/2012  . Essential hypertension 12/14/2009  . RHINITIS 03/26/2008  . Gout 01/15/2007  . ERECTILE DYSFUNCTION 01/15/2007  . CARPAL TUNNEL SYNDROME, RIGHT 01/15/2007    Electa Sniff, PT, DPT, NCS 06/10/2020, 2:10 PM  [facility] [address] [place], [place], [address] Phone: [phone]   Fax:  [phone]  Name: Brent Hissong Sr. MRN: [id number] Date of Birth: Aug 27, 1950

## 2020-06-10 NOTE — Therapy (Signed)
[facility] [address] [place], [place], [address] Phone: [phone]   Fax:  [phone]  Occupational Therapy Treatment  Patient Details  Name: Brent Faulkenberry Sr. MRN: [id number] Date of Birth: 10-05-1950 Referring Provider (OT): Marlowe Shores   Encounter Date: 06/10/2020   OT End of Session - 06/10/20 1528    Visit Number 21    Number of Visits 37    Date for OT Re-Evaluation 08/24/20    Authorization Type [employer] [facility]    Authorization Time Period week 12 of 12, wil recert x 8 more weeks    OT Start Time 1405    OT Stop Time 1445    OT Time Calculation (min) 40 min    Activity Tolerance Patient tolerated treatment well    Behavior During Therapy WFL for tasks assessed/performed           Past Medical History:  Diagnosis Date  . Carpal tunnel syndrome   . Gout   . Hyperlipidemia    on meds  . Hypertension    on meds    Past Surgical History:  Procedure Laterality Date  . COLONOSCOPY  2015   DJ-MAC-polyps  . LOOP RECORDER INSERTION N/A 02/19/2020   Procedure: LOOP RECORDER INSERTION;  Surgeon: Constance Haw, MD;  Location: [facility];  Service: Cardiovascular;  Laterality: N/A;  . no prior surgery    . POLYPECTOMY  2015   DJ-MAC-polyps    There were no vitals filed for this visit.   Subjective Assessment - 06/10/20 1411    Subjective  Patient brought in pictures of wicker chair he repaired using two hands.    Currently in Pain? 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      Neurological Re-education Exercises   Other Exercises 1 Neuromuscular reeducation  to address improving active motion and strength in RUE.  Transitioned from side sitting to 4 point with min assist, then min assist to place RUE onto surface at edge of mat table.  Patient instructed to work within pain tolerance and to not exceed typical pain feeling of 2/10.  Working to load right arm with emphasis on alignment of right humerus toward neutral rotation and elbow extended.  Patient has difficulty sustaining active elbow extension more than a few seconds.  Leads many movements with internal rotation leading toward elbow flexion.  Transitioned to supine to address ER/IR in small ranges.  Patient initially limited to ~ 10 degrees each direction - but with slow, gentle progressive movement able to achieve ~ 50% IR/ER.  Followed with Gregg open chain exercise with arm starting slightly flexed supported on pillow - elbow flexed - then working to ~ 90 shoulder flex with elbow ext.                    OT Short Term Goals - 06/10/20 1817      OT SHORT TERM GOAL #1   Title Patient will complete a home exercise program designed to improve RUE range of  motion    Period Weeks    Status Achieved    Target Date 05/02/20      OT SHORT TERM GOAL #2   Title Patient will utilize RUE to grasp release feeding utensil with min assist    Time 4    Period Weeks    Status Achieved      OT SHORT TERM GOAL #3   Title Patient will tie shoes bimanually with increased time and min assist    Time 4    Period Weeks    Status Achieved      OT SHORT TERM GOAL #4   Title Patient will zipper light weight jacket, or button 2 buttons on shirt with increased time and min assist    Time 4    Period Weeks    Status Achieved      OT SHORT TERM GOAL #5   Title Patient will utilize BUE to wash a lightweight dish while standing at sink    Time 4    Period Weeks    Status Achieved      Additional Short Term Goals   Additional Short Term Goals Yes      OT SHORT TERM GOAL #6   Title Patient will  demonstrate mid level reach in modified closed to open chain to obtain a 1lb or less weighted object with RUE    Time 4    Period Weeks    Status New    Target Date 07/10/20             OT Long Term Goals - 06/10/20 1818      OT LONG TERM GOAL #1   Title Patient will complete and updated HEP to address RUE ROM and functional strength    Time 12    Period Weeks    Status On-going   continue to add PRN     OT LONG TERM GOAL #2   Title Patient will utilize BUE to weave chair seating (caning) with increased time and intermittent support    Time 12    Period Weeks    Status Achieved   has not tried yet - will continue to work towards coordination with [place] #3   Title Patient will write a full sentence with RUE legibly    Time 8    Period Weeks    Status Achieved   pt reports it is better and wrote a sentence in clinic with 100% legibility     OT LONG TERM GOAL #4   Title Patient will reach into overhead cabinet to obtain a lightweight object (less than 3 lb) with RUE    Time 12    Period Weeks    Status On-going   pt with increased pain in RUE shoulder     OT LONG TERM GOAL #5   Title Patient will utilize mallet with RUE to tap to open joints of chair in furniture restoration project    Time 12    Period Weeks    Status On-going   still working towards increasing strength for using mallet     OT LONG TERM GOAL #6   Title Patient will demonstrate understanding of recommendations relating to retunring to driving    Time 12    Period Weeks    Status Achieved                 Plan - 06/10/20 1526    Clinical  Impression Statement Pt with continued report of increasing function in dominant RUE    OT Occupational Profile and History Detailed Assessment- Review of Records and additional review of physical, cognitive, psychosocial history related to current functional performance    Occupational performance deficits (Please refer to evaluation for  details): ADL's;IADL's;Work    Body Structure / Function / Physical Skills ADL;Coordination;Endurance;GMC;UE functional use;Balance;Decreased knowledge of precautions;Fascial restriction;Pain;IADL;Flexibility;Decreased knowledge of use of DME;Body mechanics;Dexterity;FMC;Strength;Tone;ROM    Rehab Potential Excellent    Clinical Decision Making Several treatment options, min-mod task modification necessary    Comorbidities Affecting Occupational Performance: May have comorbidities impacting occupational performance    Modification or Assistance to Complete Evaluation  Min-Moderate modification of tasks or assist with assess necessary to complete eval    OT Frequency 2x / week    OT Duration 8 weeks    OT Treatment/Interventions Self-care/ADL training;Electrical Stimulation;Therapeutic exercise;Aquatic Therapy;Moist Heat;Neuromuscular education;Compression bandaging;Splinting;Patient/family education;Balance training;Therapeutic activities;Functional Mobility Training;Fluidtherapy;Cryotherapy;Ultrasound;Contrast Bath;DME and/or AE instruction;Manual Therapy;Passive range of motion    Plan Need to work in pain free ranges - needs better muscle balance in shoulder and elbow - NMR proximal RUE - Needs scap stabilizers and improved strength/range, Functional use of RUE - for eating, in hand manip, tool use    OT Home Exercise Plan coordination HEP, putty HEP, standing rocking for shoulder ranges    Consulted and Agree with Plan of Care Patient           Patient will benefit from skilled therapeutic intervention in order to improve the following deficits and impairments:   Body Structure / Function / Physical Skills: ADL,Coordination,Endurance,GMC,UE functional use,Balance,Decreased knowledge of precautions,Fascial restriction,Pain,IADL,Flexibility,Decreased knowledge of use of DME,Body mechanics,Dexterity,FMC,Strength,Tone,ROM       Visit Diagnosis: Hemiplegia and hemiparesis following cerebral  infarction affecting right dominant side ([place]) - Plan: Ot plan of care cert/re-cert  Abnormal posture - Plan: Ot plan of care cert/re-cert  Localized edema - Plan: Ot plan of care cert/re-cert  Pain in right wrist - Plan: Ot plan of care cert/re-cert  Muscle weakness (generalized) - Plan: Ot plan of care cert/re-cert  Unsteadiness on feet - Plan: Ot plan of care cert/re-cert    Problem List Patient Active Problem List   Diagnosis Date Noted  . Spasticity as late effect of cerebrovascular accident (CVA) 04/22/2020  . Elevated BUN   . Sleep disturbance   . Infarction of left basal ganglia ([place]) 02/21/2020  . Right hemiparesis ([place])   . Leukocytosis   . Sinus tachycardia   . Dyslipidemia   . History of gout   . Left sided lacunar infarction ([place]) 02/16/2020  . Chronic hepatitis C without hepatic coma ([place]) 01/20/2016  . Lumbar disc disease with radiculopathy 03/07/2012  . Essential hypertension 12/14/2009  . RHINITIS 03/26/2008  . Gout 01/15/2007  . ERECTILE DYSFUNCTION 01/15/2007  . CARPAL TUNNEL SYNDROME, RIGHT 01/15/2007    Mariah Milling, OTR/L 06/10/2020, 6:21 PM  [facility] [address] [place], [place], [address] Phone: [phone]   Fax:  [phone]  Name: Brent Adkison Sr. MRN: [id number] Date of Birth: October 15, 1950

## 2020-06-11 LAB — CUP PACEART REMOTE DEVICE CHECK
Date Time Interrogation Session: 20220518091203
Implantable Pulse Generator Implant Date: 20220202

## 2020-06-17 ENCOUNTER — Other Ambulatory Visit: Payer: Self-pay

## 2020-06-17 ENCOUNTER — Ambulatory Visit: Payer: 59 | Attending: Physician Assistant | Admitting: Occupational Therapy

## 2020-06-17 ENCOUNTER — Ambulatory Visit: Payer: 59

## 2020-06-17 ENCOUNTER — Encounter: Payer: Self-pay | Admitting: Occupational Therapy

## 2020-06-17 VITALS — BP 141/85 | HR 82

## 2020-06-17 DIAGNOSIS — R293 Abnormal posture: Secondary | ICD-10-CM

## 2020-06-17 DIAGNOSIS — M6281 Muscle weakness (generalized): Secondary | ICD-10-CM | POA: Insufficient documentation

## 2020-06-17 DIAGNOSIS — R2681 Unsteadiness on feet: Secondary | ICD-10-CM | POA: Diagnosis not present

## 2020-06-17 DIAGNOSIS — I69351 Hemiplegia and hemiparesis following cerebral infarction affecting right dominant side: Secondary | ICD-10-CM | POA: Insufficient documentation

## 2020-06-17 DIAGNOSIS — R2689 Other abnormalities of gait and mobility: Secondary | ICD-10-CM | POA: Insufficient documentation

## 2020-06-17 DIAGNOSIS — M25531 Pain in right wrist: Secondary | ICD-10-CM

## 2020-06-17 DIAGNOSIS — R600 Localized edema: Secondary | ICD-10-CM | POA: Diagnosis present

## 2020-06-17 NOTE — Patient Instructions (Signed)
Deep Squat    Squat and lift with both arms held against upper trunk. Tighten stomach muscles without holding breath. Use smooth movements to avoid jerking.   Lifting Principles  .Maintain proper posture and head alignment. .Slide object as close as possible before lifting. .Move obstacles out of the way. .Test before lifting; ask for help if too heavy. .Tighten stomach muscles without holding breath. .Use smooth movements; do not jerk. .Use legs to do the work, and pivot with feet. .Distribute the work load symmetrically and close to the center of trunk. .Push instead of pull whenever possible.  Copyright  VHI. All rights reserved.

## 2020-06-17 NOTE — Therapy (Signed)
[facility] [address] [place], [place], [address] Phone: [phone]   Fax:  [phone]  Physical Therapy Treatment  Patient Details  Name: Brent Prabhu Sr. MRN: [id number] Date of Birth: 1951/01/09 Referring Provider (PT): Cathlyn Parsons, PA-C   Encounter Date: 06/17/2020   PT End of Session - 06/17/20 1317    Visit Number 17    Number of Visits 20    Date for PT Re-Evaluation 63/84/66   8 week cert, 6 week poc   Authorization Type [employer]; $20 copay, medicare secondary also    PT Start Time 5993    PT Stop Time 1359    PT Time Calculation (min) 42 min    Activity Tolerance Patient tolerated treatment well    Behavior During Therapy WFL for tasks assessed/performed           Past Medical History:  Diagnosis Date  . Carpal tunnel syndrome   . Gout   . Hyperlipidemia    on meds  . Hypertension    on meds    Past Surgical History:  Procedure Laterality Date  . COLONOSCOPY  2015   DJ-MAC-polyps  . LOOP RECORDER INSERTION N/A 02/19/2020   Procedure: LOOP RECORDER INSERTION;  Surgeon: Constance Haw, MD;  Location: [facility];  Service: Cardiovascular;  Laterality: N/A;  . no prior surgery    . POLYPECTOMY  2015   DJ-MAC-polyps    Vitals:   06/17/20 1342  BP: (!) 141/85  Pulse: 82     Subjective Assessment - 06/17/20 1317    Subjective Patient reports no new changes/complaints. Reports back has been feeling better. 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    Patient is accompained by: Family member    Pertinent History carpal tunnel, HTN, HLD, DM, gout, and former smoker.    Patient Stated Goals Increase use of RUE; walk without the walker.    Currently in Pain? Yes    Pain Score 2     Pain Location Shoulder    Pain Orientation Right    Pain Descriptors / Indicators Aching    Pain Type Chronic pain    Pain Onset 1 to 4 weeks ago              [facility] Adult PT Treatment/Exercise - 06/17/20 0001      Transfers   Transfers Sit to Stand;Stand to Sit    Sit to Stand 5: Supervision    Stand to Sit 5: Supervision      Ambulation/Gait   Ambulation/Gait Yes    Ambulation/Gait Assistance 6: Modified independent (Device/Increase time);5: Supervision    Ambulation/Gait Assistance Details ambulation on treadmill (see below)    Assistive device None    Gait Pattern Step-through pattern;Decreased step length - right    Ambulation Surface Level;Indoor      High Level Balance   High Level Balance Activities Negotiating over obstacles    High Level Balance Comments Completed ambulation with completed toe tap with followed step over cone x 2 laps down and back. Then completed 2 laps down and back with toe tap from BLE prior to stepping to further challenge SLS, x 2 laps. Increased challenge noted with SLS activity, intermittent CGA required.      Therapeutic Activites    Therapeutic Activities Lifting    Lifting Due to patient reports of back pain with lifting activities; simulated lifting activitie during session with weighted crate (  5#) teaching proper techniques, including keeping object close, proper use of power lift, and how to stand and pivot with steps vs twisting back with weighted item. Also educating on to push vs. pull. Provided Handout and reviewed with patient.      Knee/Hip Exercises: Aerobic   Tread Mill Completed ambulation on treadmill: walking forwards on 4% incline at speed 1.2 mph for 4 minutes to promote push off with ambulation. Then dropped incline to 2% and completed additional 2 minutes. Patient tolerating increase in speed well. Vitals stable after ambulation.             Balance Exercises - 06/17/20 0001      Balance Exercises: Standing   Sit to Stand Standard surface;Foam/compliant surface;Limitations    Sit to Stand Limitations completed sit <> stands without UE support from mat with red balance beam  under BLE, completed 2 x 10 reps. close supervision             PT Education - 06/17/20 1356    Education Details Lifting Technique    Person(s) Educated Patient    Methods Explanation;Demonstration;Handout    Comprehension Verbalized understanding;Returned demonstration            PT Short Term Goals - 06/10/20 1359      PT SHORT TERM GOAL #1   Title Pt will be instructed in ways to manage low back pain including stretching to improve flexibility with reports of <4/10 pain after activities.    Baseline 5/10 with activities >1 hour. 06/10/20 no back pain currently. The worst it has been in past week is 3/10    Time 3    Period Weeks    Status Achieved    Target Date 06/11/20             PT Long Term Goals - 05/21/20 0902      PT LONG TERM GOAL #1   Title Pt will be independent with final HEP  (ALL LTG DUE BY 05/16/20)    Baseline 05/20/20 Pt doing well with current HEP. PT continues to update.    Time 6    Period Weeks    Status On-going    Target Date 07/02/20      PT LONG TERM GOAL #2   Title Pt will report overall improvement in function on FOTO to >/= 64%    Baseline 50%. continuing with PT    Time 6    Period Weeks    Status On-going    Target Date 07/02/20      PT LONG TERM GOAL #3   Title Pt will ambulate on varied surfaces >1000' without AD independently with noted improved trunk rotation, arm swing and right toe off.    Baseline 05/20/20 850' supervision on varied surfaces more for cuing, mod I with steps with 1 rail.    Time 6    Period Weeks    Status New    Target Date 07/02/20      PT LONG TERM GOAL #4   Title Pt will increase SLS time to >7 sec bilateral for improved balance.    Baseline 05/20/20 5 sec bilateral    Time 6    Period Weeks    Status New    Target Date 07/02/20      PT LONG TERM GOAL #5   Title Pt will increase FGA from 21 to >24/30 for improved balance and gait safety.    Baseline 05/20/20 21/30    Time 6  Period Weeks     Status New    Target Date 07/02/20                 Plan - 06/17/20 1531    Clinical Impression Statement Completed review of lifting techniques and simulation today during session, with handout provided. Patient able to demo proper technique with no reports of back pain. Continued gait on treadmill, with patient toleraring progression well. Vitals stable. Continued rest of session focused on balance. Will continue to progress toward all LTGs.    Personal Factors and Comorbidities Comorbidity 3+;Profession    Comorbidities carpal tunnel, HTN, HLD, DM, gout, and former smoker    Examination-Activity Limitations Locomotion Level;Transfers;Stairs    Examination-Participation Restrictions Driving;Occupation;Community Activity    Stability/Clinical Decision Making Evolving/Moderate complexity    Rehab Potential Good    PT Frequency 1x / week    PT Duration 6 weeks    PT Treatment/Interventions ADLs/Self Care Home Management;Aquatic Therapy;Cryotherapy;Electrical Stimulation;Moist Heat;DME Instruction;Gait training;Stair training;Functional mobility training;Therapeutic activities;Therapeutic exercise;Balance training;Neuromuscular re-education;Patient/family education;Orthotic Fit/Training;Manual techniques;Passive range of motion    PT Next Visit Plan Did we try lifting techniques? Continue to work on improving trunk rotation with gait. continue dynamic gait activities without AD. Working on increasing right toe off/ stance time - treadmill with incline, trampoline, etc.. Hip ext exercises.  Continue RLE strengthening and balance training. Rockerboard activities. SLS activities.    Consulted and Agree with Plan of Care Patient;Family member/caregiver    Family Member Consulted wife           Patient will benefit from skilled therapeutic intervention in order to improve the following deficits and impairments:  Abnormal gait,Decreased balance,Decreased strength,Pain  Visit Diagnosis: Other  abnormalities of gait and mobility  Muscle weakness (generalized)  Unsteadiness on feet     Problem List Patient Active Problem List   Diagnosis Date Noted  . Spasticity as late effect of cerebrovascular accident (CVA) 04/22/2020  . Elevated BUN   . Sleep disturbance   . Infarction of left basal ganglia ([place]) 02/21/2020  . Right hemiparesis ([place])   . Leukocytosis   . Sinus tachycardia   . Dyslipidemia   . History of gout   . Left sided lacunar infarction ([place]) 02/16/2020  . Chronic hepatitis C without hepatic coma ([place]) 01/20/2016  . Lumbar disc disease with radiculopathy 03/07/2012  . Essential hypertension 12/14/2009  . RHINITIS 03/26/2008  . Gout 01/15/2007  . ERECTILE DYSFUNCTION 01/15/2007  . CARPAL TUNNEL SYNDROME, RIGHT 01/15/2007    Jones Bales, PT, DPT 06/17/2020, 3:34 PM  [facility] [address] [place], [place], [address] Phone: [phone]   Fax:  [phone]  Name: Jospeh Mangel Sr. MRN: [id number] Date of Birth: 04/29/1950

## 2020-06-17 NOTE — Therapy (Signed)
[facility] [address] [place], [place], [address] Phone: [phone]   Fax:  [phone]  Occupational Therapy Treatment  Patient Details  Name: Brent Dispenza Sr. MRN: [id number] Date of Birth: 06-Oct-1950 Referring Provider (OT): Marlowe Shores   Encounter Date: 06/17/2020   OT End of Session - 06/17/20 1507    Visit Number 22    Number of Visits 37    Date for OT Re-Evaluation 08/24/20    Authorization Type [facility]    Authorization Time Period week 1 of 8 (6/1)    Progress Note Due on Visit 30    OT Start Time 1400    OT Stop Time 1445    OT Time Calculation (min) 45 min    Activity Tolerance Patient tolerated treatment well    Behavior During Therapy WFL for tasks assessed/performed           Past Medical History:  Diagnosis Date  . Carpal tunnel syndrome   . Gout   . Hyperlipidemia    on meds  . Hypertension    on meds    Past Surgical History:  Procedure Laterality Date  . COLONOSCOPY  2015   DJ-MAC-polyps  . LOOP RECORDER INSERTION N/A 02/19/2020   Procedure: LOOP RECORDER INSERTION;  Surgeon: Constance Haw, MD;  Location: [facility];  Service: Cardiovascular;  Laterality: N/A;  . no prior surgery    . POLYPECTOMY  2015   DJ-MAC-polyps    There were no vitals filed for this visit.   Subjective Assessment - 06/17/20 1457    Subjective  Patient indicates he and his brother worked for 7 hour days two days last week to [employer].    Currently in Pain? 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  Patient is avoiding heavy lifting or movements that aggravate right shoulder, but last week able to strip and refinish dining room table.  Unable to use right hand to spray finish due to weakness, instability in RUE    ADL Comments Reviewed extension of plan of care and new and remaining goals.  Patient in agreement.      Neurological Re-education Exercises   Other Exercises 1 Neuromuscular reeducation to address shoulder girdle mechanics for reach, and coordination of arm, head/neck, and upper trunk.  Working to load right arm extended and weight bear thru heel of hand.  Some limitations with wrist extension.  Today - emphasis on elbow extension without scapular elevation and GH internal rotation.  Worked in [place] and in seated position.                  OT Education - 06/17/20 1507    Education Details reviewed extended plan of care and upgraded goals    Person(s) Educated Patient    Methods Explanation  Comprehension Verbalized understanding            OT Short Term Goals - 06/17/20 1510      OT SHORT TERM GOAL #1   Title Patient will complete a home exercise program designed to improve RUE range of motion    Period Weeks    Status Achieved    Target Date 05/02/20      OT SHORT TERM GOAL #2   Title Patient will utilize RUE to grasp release feeding utensil with min assist    Time 4    Period Weeks    Status Achieved      OT SHORT TERM GOAL #3   Title Patient will tie shoes bimanually with increased time and min assist    Time 4    Period Weeks    Status Achieved      OT SHORT TERM GOAL #4   Title Patient will zipper light weight jacket, or button 2 buttons on shirt with increased time and min assist    Time 4    Period Weeks    Status Achieved      OT SHORT TERM GOAL #5   Title Patient will utilize BUE to wash a lightweight dish while standing at sink    Time 4    Period Weeks    Status Achieved       OT SHORT TERM GOAL #6   Title Patient will demonstrate mid level reach in modified closed to open chain to obtain a 1lb or less weighted object with RUE    Time 4    Period Weeks    Status New    Target Date 07/10/20             OT Long Term Goals - 06/17/20 1510      OT LONG TERM GOAL #1   Title Patient will complete and updated HEP to address RUE ROM and functional strength    Time 12    Period Weeks    Status On-going   continue to add PRN     OT LONG TERM GOAL #2   Title Patient will utilize BUE to weave chair seating (caning) with increased time and intermittent support    Time 12    Period Weeks    Status Achieved   has not tried yet - will continue to work towards coordination with [place] #3   Title Patient will write a full sentence with RUE legibly    Time 8    Period Weeks    Status Achieved   pt reports it is better and wrote a sentence in clinic with 100% legibility     OT LONG TERM GOAL #4   Title Patient will reach into overhead cabinet to obtain a lightweight object (less than 3 lb) with RUE    Time 12    Period Weeks    Status On-going   pt with increased pain in RUE shoulder     OT LONG TERM GOAL #5   Title Patient will utilize mallet with RUE to tap to open joints of chair in furniture restoration project    Time 12    Period Weeks    Status On-going   still working towards increasing strength for using mallet     OT LONG TERM GOAL #6   Title Patient will demonstrate understanding of recommendations relating to retunring to driving    Time 12  Period Weeks    Status Achieved                 Plan - 06/17/20 1508    Clinical Impression Statement Patient continues to slowly and safely integrate RUE into daily functional tasks.    OT Occupational Profile and History Detailed Assessment- Review of Records and additional review of physical, cognitive, psychosocial history related to current functional performance     Occupational performance deficits (Please refer to evaluation for details): ADL's;IADL's;Work    Body Structure / Function / Physical Skills ADL;Coordination;Endurance;GMC;UE functional use;Balance;Decreased knowledge of precautions;Fascial restriction;Pain;IADL;Flexibility;Decreased knowledge of use of DME;Body mechanics;Dexterity;FMC;Strength;Tone;ROM    Rehab Potential Excellent    Clinical Decision Making Several treatment options, min-mod task modification necessary    Comorbidities Affecting Occupational Performance: May have comorbidities impacting occupational performance    Modification or Assistance to Complete Evaluation  Min-Moderate modification of tasks or assist with assess necessary to complete eval    OT Frequency 2x / week    OT Duration 8 weeks    OT Treatment/Interventions Self-care/ADL training;Electrical Stimulation;Therapeutic exercise;Aquatic Therapy;Moist Heat;Neuromuscular education;Compression bandaging;Splinting;Patient/family education;Balance training;Therapeutic activities;Functional Mobility Training;Fluidtherapy;Cryotherapy;Ultrasound;Contrast Bath;DME and/or AE instruction;Manual Therapy;Passive range of motion    Plan Need to work in pain free ranges - needs better muscle balance in shoulder and elbow - NMR proximal RUE - Needs scap stabilizers and improved strength/range, Needs isolated elbow extension. Functional use of RUE - for eating, in hand manip, tool use    OT Home Exercise Plan coordination HEP, putty HEP, standing rocking for shoulder ranges    Consulted and Agree with Plan of Care Patient           Patient will benefit from skilled therapeutic intervention in order to improve the following deficits and impairments:   Body Structure / Function / Physical Skills: ADL,Coordination,Endurance,GMC,UE functional use,Balance,Decreased knowledge of precautions,Fascial restriction,Pain,IADL,Flexibility,Decreased knowledge of use of DME,Body  mechanics,Dexterity,FMC,Strength,Tone,ROM       Visit Diagnosis: Hemiplegia and hemiparesis following cerebral infarction affecting right dominant side (HCC)  Abnormal posture  Muscle weakness (generalized)  Pain in right wrist  Unsteadiness on feet  Localized edema    Problem List Patient Active Problem List   Diagnosis Date Noted  . Spasticity as late effect of cerebrovascular accident (CVA) 04/22/2020  . Elevated BUN   . Sleep disturbance   . Infarction of left basal ganglia ([place]) 02/21/2020  . Right hemiparesis ([place])   . Leukocytosis   . Sinus tachycardia   . Dyslipidemia   . History of gout   . Left sided lacunar infarction ([place]) 02/16/2020  . Chronic hepatitis C without hepatic coma ([place]) 01/20/2016  . Lumbar disc disease with radiculopathy 03/07/2012  . Essential hypertension 12/14/2009  . RHINITIS 03/26/2008  . Gout 01/15/2007  . ERECTILE DYSFUNCTION 01/15/2007  . CARPAL TUNNEL SYNDROME, RIGHT 01/15/2007    Mariah Milling, OTR/L 06/17/2020, 3:11 PM  [facility] [address] [place], [place], [address] Phone: [phone]   Fax:  [phone]  Name: Brent Orth Sr. MRN: [id number] Date of Birth: 11-07-1950

## 2020-06-22 ENCOUNTER — Encounter: Payer: Self-pay | Admitting: Occupational Therapy

## 2020-06-22 ENCOUNTER — Ambulatory Visit: Payer: 59 | Admitting: Occupational Therapy

## 2020-06-22 DIAGNOSIS — M25531 Pain in right wrist: Secondary | ICD-10-CM | POA: Diagnosis not present

## 2020-06-22 DIAGNOSIS — R293 Abnormal posture: Secondary | ICD-10-CM | POA: Diagnosis not present

## 2020-06-22 DIAGNOSIS — I69351 Hemiplegia and hemiparesis following cerebral infarction affecting right dominant side: Secondary | ICD-10-CM | POA: Diagnosis not present

## 2020-06-22 DIAGNOSIS — M6281 Muscle weakness (generalized): Secondary | ICD-10-CM | POA: Diagnosis not present

## 2020-06-22 DIAGNOSIS — R2689 Other abnormalities of gait and mobility: Secondary | ICD-10-CM | POA: Diagnosis not present

## 2020-06-22 DIAGNOSIS — R2681 Unsteadiness on feet: Secondary | ICD-10-CM | POA: Diagnosis not present

## 2020-06-22 DIAGNOSIS — R600 Localized edema: Secondary | ICD-10-CM | POA: Diagnosis not present

## 2020-06-22 NOTE — Therapy (Signed)
[facility] [address] [place], [place], [address] Phone: [phone]   Fax:  [phone]  Occupational Therapy Treatment  Patient Details  Name: Brent Mathenia Sr. MRN: [id number] Date of Birth: [birth year] Referring Provider (OT): Marlowe Shores   Encounter Date: 06/22/2020   OT End of Session - 06/22/20 1918    Visit Number 23    Number of Visits 37    Date for OT Re-Evaluation 08/24/20    Authorization Type [facility]    Authorization Time Period week 2 of 8 (6/6)    Progress Note Due on Visit 30    OT Start Time 1545    OT Stop Time 1630    OT Time Calculation (min) 45 min    Activity Tolerance Patient tolerated treatment well    Behavior During Therapy WFL for tasks assessed/performed           Past Medical History:  Diagnosis Date  . Carpal tunnel syndrome   . Gout   . Hyperlipidemia    on meds  . Hypertension    on meds    Past Surgical History:  Procedure Laterality Date  . COLONOSCOPY  2015   DJ-MAC-polyps  . LOOP RECORDER INSERTION N/A 02/19/2020   Procedure: LOOP RECORDER INSERTION;  Surgeon: Constance Haw, MD;  Location: [facility];  Service: Cardiovascular;  Laterality: N/A;  . no prior surgery    . POLYPECTOMY  2015   DJ-MAC-polyps    There were no vitals filed for this visit.   Subjective Assessment - 06/22/20 1916    Subjective  Patient continues to have ache in right arm/shoulder, and mild swelling in hand.  Patient is consistently building on functional activities he is capable of completing with dominant right hand.    Currently in Pain? Yes    Pain Score 2     Pain Location Arm    Pain Orientation Right    Pain Descriptors / Indicators Aching    Pain Type Chronic pain    Pain Onset More than a month ago    Pain Frequency Intermittent    Aggravating Factors  range of motion    Pain Relieving Factors rest              Patient seen for aquatic therapy visit.   Patient entered and exited pool via stairs with supervision.   Session occurred in 3.5-4.5 ft of water.  Continuing to work to build range of motion in pain free ranges, and to build foundational strength in scapular stabilizers, as well as shoulder girdle.  Patient with improving functional use of dominant right hand, and he is working greater amounts of time in workshop.  Patient in supine with floatation devices - able to achieve nearly 90* of shoulder abduction without discomfort.  He could then use this motion to swim on his back - providing mild resistance to adduction.  Patient with improving shoulder internal rotation with extension as needed to tuck in a shirt, etc.  Patient continues to benefit from aquatic therapy.                       OT Short Term Goals - 06/22/20 1920      OT SHORT TERM GOAL #1   Title Patient will complete a home exercise program designed to improve RUE range of motion    Period Weeks    Status Achieved    Target Date 05/02/20  OT SHORT TERM GOAL #2   Title Patient will utilize RUE to grasp release feeding utensil with min assist    Time 4    Period Weeks    Status Achieved      OT SHORT TERM GOAL #3   Title Patient will tie shoes bimanually with increased time and min assist    Time 4    Period Weeks    Status Achieved      OT SHORT TERM GOAL #4   Title Patient will zipper light weight jacket, or button 2 buttons on shirt with increased time and min assist    Time 4    Period Weeks    Status Achieved      OT SHORT TERM GOAL #5   Title Patient will utilize BUE to wash a lightweight dish while standing at sink    Time 4    Period Weeks    Status Achieved      OT SHORT TERM GOAL #6   Title Patient will demonstrate mid level reach in modified closed to open chain to obtain a 1lb or less weighted object with RUE    Time 4    Period Weeks    Status New    Target Date 07/10/20             OT Long Term Goals - 06/17/20  1510      OT LONG TERM GOAL #1   Title Patient will complete and updated HEP to address RUE ROM and functional strength    Time 12    Period Weeks    Status On-going   continue to add PRN     OT LONG TERM GOAL #2   Title Patient will utilize BUE to weave chair seating (caning) with increased time and intermittent support    Time 12    Period Weeks    Status Achieved   has not tried yet - will continue to work towards coordination with [place] #3   Title Patient will write a full sentence with RUE legibly    Time 8    Period Weeks    Status Achieved   pt reports it is better and wrote a sentence in clinic with 100% legibility     OT LONG TERM GOAL #4   Title Patient will reach into overhead cabinet to obtain a lightweight object (less than 3 lb) with RUE    Time 12    Period Weeks    Status On-going   pt with increased pain in RUE shoulder     OT LONG TERM GOAL #5   Title Patient will utilize mallet with RUE to tap to open joints of chair in furniture restoration project    Time 12    Period Weeks    Status On-going   still working towards increasing strength for using mallet     OT LONG TERM GOAL #6   Title Patient will demonstrate understanding of recommendations relating to retunring to driving    Time 12    Period Weeks    Status Achieved                 Plan - 06/22/20 1919    Clinical Impression Statement Patient continues to have mild shoulder / arm pain, joint creaking noted today - patient sees physiatrist this week, and encouraged him to discuss options for pain management / pain relief with her.  OT Occupational Profile and History Detailed Assessment- Review of Records and additional review of physical, cognitive, psychosocial history related to current functional performance    Occupational performance deficits (Please refer to evaluation for details): ADL's;IADL's;Work    Body Structure / Function / Physical Skills  ADL;Coordination;Endurance;GMC;UE functional use;Balance;Decreased knowledge of precautions;Fascial restriction;Pain;IADL;Flexibility;Decreased knowledge of use of DME;Body mechanics;Dexterity;FMC;Strength;Tone;ROM    Rehab Potential Excellent    Clinical Decision Making Several treatment options, min-mod task modification necessary    Comorbidities Affecting Occupational Performance: May have comorbidities impacting occupational performance    Modification or Assistance to Complete Evaluation  Min-Moderate modification of tasks or assist with assess necessary to complete eval    OT Frequency 2x / week    OT Duration 8 weeks    OT Treatment/Interventions Self-care/ADL training;Electrical Stimulation;Therapeutic exercise;Aquatic Therapy;Moist Heat;Neuromuscular education;Compression bandaging;Splinting;Patient/family education;Balance training;Therapeutic activities;Functional Mobility Training;Fluidtherapy;Cryotherapy;Ultrasound;Contrast Bath;DME and/or AE instruction;Manual Therapy;Passive range of motion    Plan Need to work in pain free ranges - needs better muscle balance in shoulder and elbow - NMR proximal RUE - Needs scap stabilizers and improved strength/range, Needs isolated elbow extension. Functional use of RUE - for eating, in hand manip, tool use    OT Home Exercise Plan coordination HEP, putty HEP, standing rocking for shoulder ranges    Consulted and Agree with Plan of Care Patient           Patient will benefit from skilled therapeutic intervention in order to improve the following deficits and impairments:   Body Structure / Function / Physical Skills: ADL,Coordination,Endurance,GMC,UE functional use,Balance,Decreased knowledge of precautions,Fascial restriction,Pain,IADL,Flexibility,Decreased knowledge of use of DME,Body mechanics,Dexterity,FMC,Strength,Tone,ROM       Visit Diagnosis: Hemiplegia and hemiparesis following cerebral infarction affecting right dominant side  (HCC)  Abnormal posture  Pain in right wrist  Muscle weakness (generalized)  Unsteadiness on feet    Problem List Patient Active Problem List   Diagnosis Date Noted  . Spasticity as late effect of cerebrovascular accident (CVA) 04/22/2020  . Elevated BUN   . Sleep disturbance   . Infarction of left basal ganglia ([place]) 02/21/2020  . Right hemiparesis ([facility] [facility])   . Leukocytosis   . Sinus tachycardia   . Dyslipidemia   . History of gout   . Left sided lacunar infarction ([facility]) 02/16/2020  . Chronic hepatitis C without hepatic coma ([place]) 01/20/2016  . Lumbar disc disease with radiculopathy 03/07/2012  . Essential hypertension 12/14/2009  . RHINITIS 03/26/2008  . Gout 01/15/2007  . ERECTILE DYSFUNCTION 01/15/2007  . CARPAL TUNNEL SYNDROME, RIGHT 01/15/2007    Mariah Milling 06/22/2020, 7:21 PM  [facility] [address] [place], [place], [address] Phone: [phone]   Fax:  [phone]  Name: Lizzie Cokley Sr. MRN: [id number] Date of Birth: May 30, 1950

## 2020-06-24 ENCOUNTER — Other Ambulatory Visit (HOSPITAL_COMMUNITY): Payer: Self-pay

## 2020-06-24 ENCOUNTER — Encounter: Payer: Self-pay | Admitting: Physical Medicine and Rehabilitation

## 2020-06-24 ENCOUNTER — Encounter: Payer: 59 | Attending: Registered Nurse | Admitting: Physical Medicine and Rehabilitation

## 2020-06-24 ENCOUNTER — Other Ambulatory Visit: Payer: Self-pay

## 2020-06-24 VITALS — BP 120/77 | HR 81 | Temp 98.2°F | Ht 70.0 in | Wt 169.8 lb

## 2020-06-24 DIAGNOSIS — I69398 Other sequelae of cerebral infarction: Secondary | ICD-10-CM

## 2020-06-24 DIAGNOSIS — G8191 Hemiplegia, unspecified affecting right dominant side: Secondary | ICD-10-CM | POA: Diagnosis not present

## 2020-06-24 DIAGNOSIS — M25511 Pain in right shoulder: Secondary | ICD-10-CM | POA: Diagnosis not present

## 2020-06-24 DIAGNOSIS — R252 Cramp and spasm: Secondary | ICD-10-CM | POA: Insufficient documentation

## 2020-06-24 MED ORDER — BACLOFEN 5 MG PO TABS
5.0000 mg | ORAL_TABLET | Freq: Three times a day (TID) | ORAL | 5 refills | Status: DC
  Filled 2020-06-24: qty 180, 30d supply, fill #0
  Filled 2020-08-06: qty 180, 30d supply, fill #1

## 2020-06-24 NOTE — Progress Notes (Signed)
Subjective:    Patient ID: Brent Aloe Sr., male    DOB: [birth year], 70 y.o.   MRN: [id number]  HPI   Pt is a 70 yr old R handed male with hx of gout, HTN, HLD with new L  Basal ganglia infarct with R hemiparesis, new spasticity as well as loop recorder placement-  Here for f/u on R shoulder pain and R hemiparesis.    Still has some swellling in R hand- gout pain is better  Stiffness and tightness notable- and is more.  Sounds like spasticity.    Driving well- drove here today.  Mainly the arm is the issueGerald Stabs- OT re-eval him and cleared him for driving.   Getting pool therapy- does better with R shoulder due to pool therapy. Extended therapy for another 8 weeks.    Pain Inventory Average Pain 5 Pain Right Now 3 My pain is aching  In the last 24 hours, has pain interfered with the following? General activity 5 Relation with others 5 Enjoyment of life 5 What TIME of day is your pain at its worst? evening Sleep (in general) Fair  Pain is worse with: some activites and moving arm Pain improves with: rest Relief from Meds: n/a  Family History  Problem Relation Age of Onset  . Ovarian cancer Mother   . Heart disease Father   . Hypertension Father   . Colon polyps Sister 34  . Colon cancer Neg Hx   . Esophageal cancer Neg Hx   . Rectal cancer Neg Hx   . Stomach cancer Neg Hx    Social History   Socioeconomic History  . Marital status: Married    Spouse name: Not on file  . Number of children: Not on file  . Years of education: Not on file  . Highest education level: Not on file  Occupational History  . Not on file  Tobacco Use  . Smoking status: Former Smoker    Quit date: 01/17/1966    Years since quitting: 54.4  . Smokeless tobacco: Never Used  Vaping Use  . Vaping Use: Never used  Substance and Sexual Activity  . Alcohol use: Yes    Comment: occassionally  . Drug use: No  . Sexual activity: Yes  Other Topics Concern  . 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    Married    Two children, live locally.       Three dogs and two cats    Social Determinants of Health   Financial Resource Strain: Not on file  Food Insecurity: Not on file  Transportation Needs: Not on file  Physical Activity: Not on file  Stress: Not on file  Social Connections: Not on file   Past Surgical History:  Procedure Laterality Date  . COLONOSCOPY  2015   DJ-MAC-polyps  . LOOP RECORDER INSERTION N/A 02/19/2020   Procedure: LOOP RECORDER INSERTION;  Surgeon: Constance Haw, MD;  Location: [facility];  Service: Cardiovascular;  Laterality: N/A;  . no prior surgery    . POLYPECTOMY  2015   DJ-MAC-polyps   Past Surgical History:  Procedure Laterality Date  . COLONOSCOPY  2015   DJ-MAC-polyps  . LOOP RECORDER INSERTION N/A 02/19/2020   Procedure: LOOP RECORDER INSERTION;  Surgeon: Constance Haw, MD;  Location: [facility];  Service: Cardiovascular;  Laterality: N/A;  . no prior  surgery    . POLYPECTOMY  2015   DJ-MAC-polyps   Past Medical History:  Diagnosis Date  . Carpal tunnel syndrome   . Gout   . Hyperlipidemia    on meds  . Hypertension    on meds   BP 120/77   Pulse 81   Temp 98.2 F (36.8 C)   Ht _0  (1.778 m)   Wt 169 lb 12.8 oz (77 kg)   SpO2 97%   BMI 24.36 kg/m   Opioid Risk Score:   Fall Risk Score:  `1  Depression screen PHQ 2/9  Depression screen [facility] 2/9 04/22/2020 03/16/2020 06/11/2019 02/27/2018 01/20/2017 01/20/2016  Decreased Interest 0 0 0 0 0 0  Down, Depressed, Hopeless 0 0 0 0 - 0  PHQ - 2 Score 0 0 0 0 0 0  Altered sleeping - 1 - - - -  Tired, decreased energy - 0 - - - -  Change in appetite - 0 - - - -  Feeling bad or failure about yourself  - 0 - - - -  Trouble concentrating - 0 - - - -  Moving slowly or fidgety/restless - 0 - - - -  Suicidal thoughts - 0 - - - -  PHQ-9 Score - 1 - - - -      Review of Systems  Constitutional: Negative.   HENT: Negative.   Eyes: Negative.   Respiratory: Negative.   Cardiovascular: Negative.   Gastrointestinal: Negative.   Endocrine: Negative.   Genitourinary: Negative.   Musculoskeletal:       Right shoulder pain , swelling right hand  Skin: Negative.   Allergic/Immunologic: Negative.   Neurological: Negative.   Hematological: Negative.   Psychiatric/Behavioral: Negative.        Objective:   Physical Exam  Awake, alert, appropriate, sitting on table, NAD MAS of RUE is 1+, esp R wrist, fingers, and shoulder- can also feel tightness in R pec.  Subluxation 1 finger of R shoulder TTP esp with empty can test over [facility] joint. On R MS: RUE- Deltoid 2+/5, bicep 4/5, triceps 4+/5, WE 4+/5, grip 4/5, and finger abd 4/5     Assessment & Plan:   Pt is a 70 yr old R handed male with hx of gout, HTN, HLD with new L  Basal ganglia infarct with R hemiparesis, new spasticity as well as loop recorder placement-  Here for f/u on R shoulder pain and R hemiparesis.    1. Will get pt back for R shoulder steroid injection for constant pain since stroke in R shoulder and R bicep/end of deltoid- so pain is directly related to his subluxation of the shoulder and stroke.    2. Baclofen- 5 mg 3x/day- for 2 weeks then IF you need to, can go to 10 mg 3x/day- breakfast, lunch and bedtime- to [employer]- 5 mg tablets.   3. Voltaren gel - is over the counter- can use up to 4x/day.  Until can get shoulder injection.- 2 grams max in shoulder-   4.  F/U in 3 months AND also asap for injections.

## 2020-06-25 ENCOUNTER — Ambulatory Visit: Payer: 59 | Admitting: Occupational Therapy

## 2020-06-25 ENCOUNTER — Encounter: Payer: Self-pay | Admitting: Occupational Therapy

## 2020-06-25 ENCOUNTER — Ambulatory Visit: Payer: 59

## 2020-06-25 DIAGNOSIS — R293 Abnormal posture: Secondary | ICD-10-CM

## 2020-06-25 DIAGNOSIS — I69351 Hemiplegia and hemiparesis following cerebral infarction affecting right dominant side: Secondary | ICD-10-CM | POA: Diagnosis not present

## 2020-06-25 DIAGNOSIS — M25531 Pain in right wrist: Secondary | ICD-10-CM

## 2020-06-25 DIAGNOSIS — M6281 Muscle weakness (generalized): Secondary | ICD-10-CM

## 2020-06-25 DIAGNOSIS — R2681 Unsteadiness on feet: Secondary | ICD-10-CM

## 2020-06-25 DIAGNOSIS — R2689 Other abnormalities of gait and mobility: Secondary | ICD-10-CM | POA: Diagnosis not present

## 2020-06-25 DIAGNOSIS — R600 Localized edema: Secondary | ICD-10-CM | POA: Diagnosis not present

## 2020-06-25 NOTE — Therapy (Signed)
[facility] [address] [place], [place], [address] Phone: [phone]   Fax:  [phone]  Physical Therapy Treatment  Patient Details  Name: Brent Vangieson Sr. MRN: [id number] Date of Birth: [birth year] Referring Provider (PT): Cathlyn Parsons, PA-C   Encounter Date: 06/25/2020   PT End of Session - 06/25/20 1509     Visit Number 18    Number of Visits 20    Date for PT Re-Evaluation 07/62/26   8 week cert, 6 week poc   Authorization Type [employer]; $20 copay, medicare secondary also    PT Start Time 1505    PT Stop Time 1550    PT Time Calculation (min) 45 min    Equipment Utilized During Treatment Gait belt    Activity Tolerance Patient tolerated treatment well    Behavior During Therapy WFL for tasks assessed/performed             Past Medical History:  Diagnosis Date   Carpal tunnel syndrome    Gout    Hyperlipidemia    on meds   Hypertension    on meds    Past Surgical History:  Procedure Laterality Date   COLONOSCOPY  2015   DJ-MAC-polyps   LOOP RECORDER INSERTION N/A 02/19/2020   Procedure: LOOP RECORDER INSERTION;  Surgeon: Constance Haw, MD;  Location: [facility];  Service: Cardiovascular;  Laterality: N/A;   no prior surgery     POLYPECTOMY  2015   DJ-MAC-polyps    There were no vitals filed for this visit.   Subjective Assessment - 06/25/20 1507     Subjective Patient reports that he got to do the bending techniques and feels comfortable with them. No falls. Reports he saw Dr. Dagoberto Ligas yesterday.    Patient is accompained by: Family member    Pertinent History carpal tunnel, HTN, HLD, DM, gout, and former smoker.    Patient Stated Goals Increase use of RUE; walk without the walker.    Currently in Pain? Yes    Pain Score 2     Pain Location Shoulder    Pain Orientation Right    Pain Descriptors / Indicators Aching    Pain Type Chronic pain    Pain Onset 1 to 4 weeks  ago               [facility] Adult PT Treatment/Exercise - 06/25/20 0001       Transfers   Transfers Sit to Stand;Stand to Sit    Sit to Stand 5: Supervision    Stand to Sit 5: Supervision      Ambulation/Gait   Ambulation/Gait Yes    Ambulation/Gait Assistance 6: Modified independent (Device/Increase time)    Ambulation/Gait Assistance Details Completed ambulation x 1200 ft outdoors on unlevel surfaces included pavement/grass. No imbalance noted on any surface. In therapy gym on level surfaces, completed ambulation with using walking sticks to promote bil arm swing. Completed ambulation x 230 ft with walking sticks, then x 230 ft without to promote carryover. Improvements noted.    Ambulation Distance (Feet) 1200 Feet   400 x 1   Assistive device None    Gait Pattern Step-through pattern;Decreased step length - right    Ambulation Surface Level;Indoor      High Level Balance   High Level Balance Activities Negotiating over obstacles    High Level Balance Comments Completed ambulation with completed toe tap with followed step over cone x 2 laps down and back.  Then completed 2 laps down and back with toe tap from BLE prior to stepping to further challenge SLS, x 2 laps. With BLE toe tap added in dual tasking, including counting backwards by 3's and naming food that starts with letter of alphabelt called out by PT. Increased challenge with dual task, especially noted with counting.      Neuro Re-ed    Neuro Re-ed Details  In // bars: step-ups on rockerboard positioned ant/post with alternating BLE followed by toe tap to cone with float leg x 10 reps without UE support. Intermitent CGA required, increased challenge with SLS still ntoed without UE support              Balance Exercises - 06/25/20 0001       Balance Exercises: Standing   SLS Eyes open;Foam/compliant surface;3 reps;Time    SLS Time 10-15 secs w/ eyes open and foam surface. light fingertip support.    Rockerboard  Anterior/posterior;EO;EC;30 seconds;Limitations;Intermittent UE support    Rockerboard Limitations standing on rockerboard positioned A/P, completed standing with eyes closed 4 x 30 seconds. initially intiaited with hip strategy, but able to correct to ankle strategy with cues. CGA throughout.    Other Standing Exercises Completed SLS on firm surface with intermittent UE support, PT tossing 1# weighted ball in various directions. Alternating foot, completed 3 x 3 throws on BLE. Intermittent touch down to balance challenge required.                 PT Short Term Goals - 06/10/20 1359       PT SHORT TERM GOAL #1   Title Pt will be instructed in ways to manage low back pain including stretching to improve flexibility with reports of <4/10 pain after activities.    Baseline 5/10 with activities >1 hour. 06/10/20 no back pain currently. The worst it has been in past week is 3/10    Time 3    Period Weeks    Status Achieved    Target Date 06/11/20               PT Long Term Goals - 05/21/20 0902       PT LONG TERM GOAL #1   Title Pt will be independent with final HEP  (ALL LTG DUE BY 05/16/20)    Baseline 05/20/20 Pt doing well with current HEP. PT continues to update.    Time 6    Period Weeks    Status On-going    Target Date 07/02/20      PT LONG TERM GOAL #2   Title Pt will report overall improvement in function on FOTO to >/= 64%    Baseline 50%. continuing with PT    Time 6    Period Weeks    Status On-going    Target Date 07/02/20      PT LONG TERM GOAL #3   Title Pt will ambulate on varied surfaces >1000' without AD independently with noted improved trunk rotation, arm swing and right toe off.    Baseline 05/20/20 850' supervision on varied surfaces more for cuing, mod I with steps with 1 rail.    Time 6    Period Weeks    Status New    Target Date 07/02/20      PT LONG TERM GOAL #4   Title Pt will increase SLS time to >7 sec bilateral for improved balance.     Baseline 05/20/20 5 sec bilateral    Time 6  Period Weeks    Status New    Target Date 07/02/20      PT LONG TERM GOAL #5   Title Pt will increase FGA from 21 to >24/30 for improved balance and gait safety.    Baseline 05/20/20 21/30    Time 6    Period Weeks    Status New    Target Date 07/02/20                   Plan - 06/25/20 1637     Clinical Impression Statement Continued gait training outdoors on unlevel surfaces and indoors focused on improved bil arm swing. Rest of session focused on continued high level balance and SLS activities. Will continue to progress toward all LTGs.    Personal Factors and Comorbidities Comorbidity 3+;Profession    Comorbidities carpal tunnel, HTN, HLD, DM, gout, and former smoker    Examination-Activity Limitations Locomotion Level;Transfers;Stairs    Examination-Participation Restrictions Driving;Occupation;Community Activity    Stability/Clinical Decision Making Evolving/Moderate complexity    Rehab Potential Good    PT Frequency 1x / week    PT Duration 6 weeks    PT Treatment/Interventions ADLs/Self Care Home Management;Aquatic Therapy;Cryotherapy;Electrical Stimulation;Moist Heat;DME Instruction;Gait training;Stair training;Functional mobility training;Therapeutic activities;Therapeutic exercise;Balance training;Neuromuscular re-education;Patient/family education;Orthotic Fit/Training;Manual techniques;Passive range of motion    PT Next Visit Plan Check LTGs. Continue to work on improving trunk rotation with gait. continue dynamic gait activities without AD. Working on increasing right toe off/ stance time - treadmill with incline, trampoline, etc.. Hip ext exercises.  Continue RLE strengthening and balance training. Rockerboard activities. SLS activities.    Consulted and Agree with Plan of Care Patient;Family member/caregiver    Family Member Consulted wife             Patient will benefit from skilled therapeutic intervention in  order to improve the following deficits and impairments:  Abnormal gait, Decreased balance, Decreased strength, Pain  Visit Diagnosis: Muscle weakness (generalized)  Unsteadiness on feet  Other abnormalities of gait and mobility     Problem List Patient Active Problem List   Diagnosis Date Noted   Pain in joint of right shoulder 06/24/2020   Spasticity as late effect of cerebrovascular accident (CVA) 04/22/2020   Elevated BUN    Sleep disturbance    Infarction of left basal ganglia (HCC) 02/21/2020   Right hemiparesis (HCC)    Leukocytosis    Sinus tachycardia    Dyslipidemia    History of gout    Left sided lacunar infarction ([facility]) 02/16/2020   Chronic hepatitis C without hepatic coma ([facility]) 01/20/2016   Lumbar disc disease with radiculopathy 03/07/2012   Essential hypertension 12/14/2009   RHINITIS 03/26/2008   Gout 01/15/2007   ERECTILE DYSFUNCTION 01/15/2007   CARPAL TUNNEL SYNDROME, RIGHT 01/15/2007    Jones Bales, PT, DPT 06/25/2020, 4:43 PM  [facility] [address] [place], [place], [address] Phone: [phone]   Fax:  [phone]  Name: Brent Lukas Sr. MRN: [id number] Date of Birth: 1950-04-28

## 2020-06-25 NOTE — Therapy (Signed)
[facility] [address] [place], [place], [address] Phone: [phone]   Fax:  [phone]  Occupational Therapy Treatment  Patient Details  Name: Brent Melucci Sr. MRN: [id number] Date of Birth: 1950-03-16 Referring Provider (OT): Marlowe Shores   Encounter Date: 06/25/2020   OT End of Session - 06/25/20 1449     Visit Number 23    Number of Visits 37    Date for OT Re-Evaluation 08/24/20    Authorization Type [employer] [facility]    Authorization Time Period week 2 of 8 (6/6)    Progress Note Due on Visit 30    OT Start Time 1400    OT Stop Time 1445    OT Time Calculation (min) 45 min    Activity Tolerance Patient tolerated treatment well    Behavior During Therapy WFL for tasks assessed/performed             Past Medical History:  Diagnosis Date   Carpal tunnel syndrome    Gout    Hyperlipidemia    on meds   Hypertension    on meds    Past Surgical History:  Procedure Laterality Date   COLONOSCOPY  2015   DJ-MAC-polyps   LOOP RECORDER INSERTION N/A 02/19/2020   Procedure: LOOP RECORDER INSERTION;  Surgeon: Constance Haw, MD;  Location: [facility];  Service: Cardiovascular;  Laterality: N/A;   no prior surgery     POLYPECTOMY  2015   DJ-MAC-polyps    There were no vitals filed for this visit.                 OT Treatments/Exercises (OP) - 06/25/20 0001       ADLs   Medication Management Patient had MD appointment yesterday with physiatrist, and has started on Baclofen.  Is getting scheduled for an injection to address on-going shoulder pain.  Very beneficial appointment in his opinion.      Neurological Re-education Exercises   Other Exercises 1 Neuromuscular reeducation to address self stretching methods for right shoulder.  See patient instructions.  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  Worked  on pattern of movement to use RUE more effectively to transition out of bed and accept weight thru RUE.                    OT Education - 06/25/20 1448     Education Details morning stretch routine,    Person(s) Educated Patient    Methods Explanation;Demonstration;Tactile cues;Verbal cues;Handout    Comprehension Verbalized understanding;Returned demonstration              OT Short Term Goals - 06/25/20 1450       OT SHORT TERM GOAL #1   Title Patient will complete a home exercise program designed to improve RUE range of motion    Period Weeks    Status Achieved    Target Date 05/02/20      OT SHORT TERM GOAL #2   Title Patient will utilize RUE to grasp release feeding utensil with min assist    Time 4    Period Weeks    Status Achieved      OT SHORT TERM GOAL #3   Title Patient will tie shoes bimanually with increased time and min assist    Time 4    Period Weeks    Status Achieved  OT SHORT TERM GOAL #4   Title Patient will zipper light weight jacket, or button 2 buttons on shirt with increased time and min assist    Time 4    Period Weeks    Status Achieved      OT SHORT TERM GOAL #5   Title Patient will utilize BUE to wash a lightweight dish while standing at sink    Time 4    Period Weeks    Status Achieved      OT SHORT TERM GOAL #6   Title Patient will demonstrate mid level reach in modified closed to open chain to obtain a 1lb or less weighted object with RUE    Time 4    Period Weeks    Status New    Target Date 07/10/20               OT Long Term Goals - 06/25/20 1450       OT LONG TERM GOAL #1   Title Patient will complete and updated HEP to address RUE ROM and functional strength    Time 12    Period Weeks    Status On-going   continue to add PRN     OT LONG TERM GOAL #2   Title Patient will utilize BUE to weave chair seating (caning) with increased time and intermittent support    Time 12    Period Weeks     Status Achieved   has not tried yet - will continue to work towards coordination with [place] #3   Title Patient will write a full sentence with RUE legibly    Time 8    Period Weeks    Status Achieved   pt reports it is better and wrote a sentence in clinic with 100% legibility     OT LONG TERM GOAL #4   Title Patient will reach into overhead cabinet to obtain a lightweight object (less than 3 lb) with RUE    Time 12    Period Weeks    Status On-going   pt with increased pain in RUE shoulder     OT LONG TERM GOAL #5   Title Patient will utilize mallet with RUE to tap to open joints of chair in furniture restoration project    Time 12    Period Weeks    Status On-going   still working towards increasing strength for using mallet     OT LONG TERM GOAL #6   Title Patient will demonstrate understanding of recommendations relating to retunring to driving    Time 12    Period Weeks    Status Achieved                   Plan - 06/25/20 1449     Clinical Impression Statement Patient recently started on medication to help manage arm tightness, and is scheduled for shoulder injection to address arm pain.    OT Occupational Profile and History Detailed Assessment- Review of Records and additional review of physical, cognitive, psychosocial history related to current functional performance    Occupational performance deficits (Please refer to evaluation for details): ADL's;IADL's;Work    Body Structure / Function / Physical Skills ADL;Coordination;Endurance;GMC;UE functional use;Balance;Decreased knowledge of precautions;Fascial restriction;Pain;IADL;Flexibility;Decreased knowledge of use of DME;Body mechanics;Dexterity;FMC;Strength;Tone;ROM    Rehab Potential Excellent    Clinical Decision Making Several treatment options, min-mod task modification necessary    Comorbidities Affecting Occupational Performance: May  have comorbidities impacting occupational  performance    Modification or Assistance to Complete Evaluation  Min-Moderate modification of tasks or assist with assess necessary to complete eval    OT Frequency 2x / week    OT Duration 8 weeks    OT Treatment/Interventions Self-care/ADL training;Electrical Stimulation;Therapeutic exercise;Aquatic Therapy;Moist Heat;Neuromuscular education;Compression bandaging;Splinting;Patient/family education;Balance training;Therapeutic activities;Functional Mobility Training;Fluidtherapy;Cryotherapy;Ultrasound;Contrast Bath;DME and/or AE instruction;Manual Therapy;Passive range of motion    Plan Review HEP - orning stretch.  Need to work in pain free ranges - needs better muscle balance in shoulder and elbow - NMR proximal RUE - Needs scap stabilizers and improved strength/range, Needs isolated elbow extension. Functional use of RUE - for eating, in hand manip, tool use    OT Home Exercise Plan coordination HEP, putty HEP, standing rocking for shoulder ranges    Consulted and Agree with Plan of Care Patient             Patient will benefit from skilled therapeutic intervention in order to improve the following deficits and impairments:   Body Structure / Function / Physical Skills: ADL, Coordination, Endurance, GMC, UE functional use, Balance, Decreased knowledge of precautions, Fascial restriction, Pain, IADL, Flexibility, Decreased knowledge of use of DME, Body mechanics, Dexterity, FMC, Strength, Tone, ROM       Visit Diagnosis: Hemiplegia and hemiparesis following cerebral infarction affecting right dominant side (HCC)  Abnormal posture  Pain in right wrist  Muscle weakness (generalized)  Unsteadiness on feet  Localized edema    Problem List Patient Active Problem List   Diagnosis Date Noted   Pain in joint of right shoulder 06/24/2020   Spasticity as late effect of cerebrovascular accident (CVA) 04/22/2020   Elevated BUN    Sleep disturbance    Infarction of left basal  ganglia (HCC) 02/21/2020   Right hemiparesis (HCC)    Leukocytosis    Sinus tachycardia    Dyslipidemia    History of gout    Left sided lacunar infarction ([facility]) 02/16/2020   Chronic hepatitis C without hepatic coma ([facility]) 01/20/2016   Lumbar disc disease with radiculopathy 03/07/2012   Essential hypertension 12/14/2009   RHINITIS 03/26/2008   Gout 01/15/2007   ERECTILE DYSFUNCTION 01/15/2007   CARPAL TUNNEL SYNDROME, RIGHT 01/15/2007    Mariah Milling, OTR/L 06/25/2020, 2:51 PM  [facility] [address] [place], [place], [address] Phone: [phone]   Fax:  [phone]  Name: Brent Mash Sr. MRN: [id number] Date of Birth: 1950-03-23

## 2020-06-25 NOTE — Patient Instructions (Signed)
Morning stretch routine:  Lying on your back in bed - 1 pillow under your head.   Right arm at ~ 45 degree angle to your body.   Lift your head off the pillow and roll to the right side.   Roll onto your back.  Repeat 3-4 times. Should have no pain.    Try rolling toward left side keeping right arm in place.  Head follows the knees.    Gradually increase the angle of your right arm by about 2 inches by grabbing above the elbow.   Repeat as above.  Bonus points:   While you are lying on your right side -Bend and straighten elbow,  -with your elbow bent to 90 degrees - pivot on your upper arm to roll so hand moves upward toward head, or downward toward ribs.   - bending and straightening wrist - with forearm facing upward or downward

## 2020-06-26 ENCOUNTER — Other Ambulatory Visit: Payer: Self-pay

## 2020-06-26 ENCOUNTER — Encounter: Payer: Self-pay | Admitting: Physical Medicine and Rehabilitation

## 2020-06-26 ENCOUNTER — Encounter: Payer: 59 | Admitting: Physical Medicine and Rehabilitation

## 2020-06-26 VITALS — BP 136/81 | HR 73 | Temp 98.0°F | Ht 70.0 in | Wt 169.0 lb

## 2020-06-26 DIAGNOSIS — R252 Cramp and spasm: Secondary | ICD-10-CM | POA: Diagnosis not present

## 2020-06-26 DIAGNOSIS — M25511 Pain in right shoulder: Secondary | ICD-10-CM

## 2020-06-26 DIAGNOSIS — G8191 Hemiplegia, unspecified affecting right dominant side: Secondary | ICD-10-CM

## 2020-06-26 DIAGNOSIS — I69398 Other sequelae of cerebral infarction: Secondary | ICD-10-CM | POA: Diagnosis not present

## 2020-06-26 NOTE — Patient Instructions (Signed)
Plan:  steroid injection was performed at bicipital tendon and R glenohumeral steroid injection using 1% plain Lidocaine and 40mg  /1cc of Kenalog. This was well tolerated. Cleaned with betadine x3 and allowed to dry- then alcohol then injected using 27 gauge 1.5 inch needle- no bleeding or complications.  F/U in 3 months for steroid injections of shoulders/biceps tendons.  Lidocaine will kick in 15 minutes- and wear off tonight- the steroid will kick in tomorrow within 24 hours and take up to 72 hours to fully kick in.   2. F/U in 3 months- as scheduled.  If it doesn't work, call me to let me know.

## 2020-06-26 NOTE — Progress Notes (Signed)
  Pt is a 70 yr old R handed male with hx of gout, HTN, HLD with new L  Basal ganglia infarct with R hemiparesis, new spasticity as well as loop recorder placement-   Here for f/u on R shoulder pain and R hemiparesis.    Here for injection of R shoulder-  No change in pain.  Main issue is when lifting the R arm, pain is the most .   Plan:  steroid injection was performed at bicipital tendon and R glenohumeral steroid injection using 1% plain Lidocaine and 40mg  /1cc of Kenalog. This was well tolerated. Cleaned with betadine x3 and allowed to dry- then alcohol then injected using 27 gauge 1.5 inch needle- no bleeding or complications.  F/U in 3 months for steroid injections of shoulders/biceps tendons.  Lidocaine will kick in 15 minutes- and wear off tonight- the steroid will kick in tomorrow within 24 hours and take up to 72 hours to fully kick in.   2. F/U in 3 months- as scheduled.  If it doesn't work, call me to let me know.

## 2020-06-29 ENCOUNTER — Ambulatory Visit: Payer: 59 | Admitting: Occupational Therapy

## 2020-06-29 NOTE — Progress Notes (Signed)
[facility] Summary Report / Loop Recorder 

## 2020-06-30 ENCOUNTER — Other Ambulatory Visit (HOSPITAL_COMMUNITY): Payer: Self-pay

## 2020-06-30 ENCOUNTER — Other Ambulatory Visit: Payer: Self-pay | Admitting: Adult Health

## 2020-06-30 MED ORDER — ROSUVASTATIN CALCIUM 20 MG PO TABS
ORAL_TABLET | Freq: Every day | ORAL | 0 refills | Status: DC
  Filled 2020-06-30: qty 90, 90d supply, fill #0

## 2020-07-01 ENCOUNTER — Ambulatory Visit: Payer: 59 | Admitting: Occupational Therapy

## 2020-07-01 ENCOUNTER — Ambulatory Visit: Payer: 59

## 2020-07-01 ENCOUNTER — Other Ambulatory Visit: Payer: Self-pay

## 2020-07-01 ENCOUNTER — Encounter: Payer: Self-pay | Admitting: Occupational Therapy

## 2020-07-01 DIAGNOSIS — R600 Localized edema: Secondary | ICD-10-CM | POA: Diagnosis not present

## 2020-07-01 DIAGNOSIS — R293 Abnormal posture: Secondary | ICD-10-CM | POA: Diagnosis not present

## 2020-07-01 DIAGNOSIS — M6281 Muscle weakness (generalized): Secondary | ICD-10-CM | POA: Diagnosis not present

## 2020-07-01 DIAGNOSIS — M25531 Pain in right wrist: Secondary | ICD-10-CM | POA: Diagnosis not present

## 2020-07-01 DIAGNOSIS — I69351 Hemiplegia and hemiparesis following cerebral infarction affecting right dominant side: Secondary | ICD-10-CM

## 2020-07-01 DIAGNOSIS — R2681 Unsteadiness on feet: Secondary | ICD-10-CM

## 2020-07-01 DIAGNOSIS — R2689 Other abnormalities of gait and mobility: Secondary | ICD-10-CM | POA: Diagnosis not present

## 2020-07-01 NOTE — Patient Instructions (Signed)
Access Code: [email or web address] URL: [email or web address] Date: 05/27/2020 Prepared by: Cherly Anderson   Exercises Walking March - 2 x daily - 7 x weekly - 1 sets - 3-4 reps - added green theraband Backward Walking with Counter Support - 2 x daily - 7 x weekly - 1 sets - 3-4 reps Standing Single Leg Stance with Counter Support - 1 x daily - 7 x weekly - 1 sets - 3 reps - 20-30 sec hold Sit to Stand with Right Leg Back - stand on pillow - 1 x daily - 7 x weekly - 2 sets - 10 reps Lateral Step Up - 1 x daily - 7 x weekly - 2 sets - 10 reps Reverse Band Walks - 2 x daily - 5 x weekly - 1 sets - 2-3 reps Band Walks - 2 x daily - 5 x weekly - 1 sets - 2-3 reps Supine Lower Trunk Rotation - 2 x daily - 7 x weekly - 1 sets - 4 reps - 10 sec hold

## 2020-07-01 NOTE — Therapy (Signed)
[facility] [address] [place], [place], [address] Phone: [phone]   Fax:  [phone]  Occupational Therapy Treatment  Patient Details  Name: Brent Cavitt Sr. MRN: [id number] Date of Birth: 1950-09-18 Referring Provider (OT): Marlowe Shores   Encounter Date: 07/01/2020   OT End of Session - 07/01/20 1514     Visit Number 24    Number of Visits 37    Date for OT Re-Evaluation 08/24/20    Authorization Type [employer] [facility]    Authorization Time Period week 3 of 8 (6/15)    Progress Note Due on Visit 30    OT Start Time 1400    OT Stop Time 1445    OT Time Calculation (min) 45 min    Activity Tolerance Patient tolerated treatment well    Behavior During Therapy WFL for tasks assessed/performed             Past Medical History:  Diagnosis Date   Carpal tunnel syndrome    Gout    Hyperlipidemia    on meds   Hypertension    on meds    Past Surgical History:  Procedure Laterality Date   COLONOSCOPY  2015   DJ-MAC-polyps   LOOP RECORDER INSERTION N/A 02/19/2020   Procedure: LOOP RECORDER INSERTION;  Surgeon: Constance Haw, MD;  Location: [facility];  Service: Cardiovascular;  Laterality: N/A;   no prior surgery     POLYPECTOMY  2015   DJ-MAC-polyps    There were no vitals filed for this visit.   Subjective Assessment - 07/01/20 1507     Subjective  Patient received injection in right shoulder and has had significant reduction in shoulder pain!  Patient still limited by weakness and tightness in right shoulder and elbow.    Currently in Pain? No/denies    Pain Score 0-No pain                          OT Treatments/Exercises (OP) - 07/01/20 1509       ADLs   Work Patient report he was able to use a hammer to break down the joints of a chair.      Neurological Re-education Exercises   Other Exercises 1 Neuromuscular reeducation to address mechanics of reach in RUE.   Supine to address scapular humeral rhtym in increasing ranges of shoulder flex/abd/horiz/adduction.  Worked with prolonged stretch to increase range between scap and humerus.  Worked to improve active scap depression with mid to high reach.  Followed with standing to use UE Ranger at the wall to address mid to high reach in upright.  Working on postural control and activation for isolated shoulder girdle motion.                    OT Education - 07/01/20 1514     Education Details scapular depression and postural set for mid to high level reach    Person(s) Educated Patient    Methods Explanation;Demonstration;Tactile cues;Verbal cues    Comprehension Need further instruction              OT Short Term Goals - 07/01/20 1517       OT SHORT TERM GOAL #1   Title Patient will complete a home exercise program designed to improve RUE range of motion    Period Weeks    Status Achieved    Target Date 05/02/20  OT SHORT TERM GOAL #2   Title Patient will utilize RUE to grasp release feeding utensil with min assist    Time 4    Period Weeks    Status Achieved      OT SHORT TERM GOAL #3   Title Patient will tie shoes bimanually with increased time and min assist    Time 4    Period Weeks    Status Achieved      OT SHORT TERM GOAL #4   Title Patient will zipper light weight jacket, or button 2 buttons on shirt with increased time and min assist    Time 4    Period Weeks    Status Achieved      OT SHORT TERM GOAL #5   Title Patient will utilize BUE to wash a lightweight dish while standing at sink    Time 4    Period Weeks    Status Achieved      OT SHORT TERM GOAL #6   Title Patient will demonstrate mid level reach in modified closed to open chain to obtain a 1lb or less weighted object with RUE    Time 4    Period Weeks    Status New    Target Date 07/10/20               OT Long Term Goals - 07/01/20 1517       OT LONG TERM GOAL #1   Title Patient  will complete and updated HEP to address RUE ROM and functional strength    Time 12    Period Weeks    Status On-going   continue to add PRN     OT LONG TERM GOAL #2   Title Patient will utilize BUE to weave chair seating (caning) with increased time and intermittent support    Time 12    Period Weeks    Status Achieved   has not tried yet - will continue to work towards coordination with [place] #3   Title Patient will write a full sentence with RUE legibly    Time 8    Period Weeks    Status Achieved   pt reports it is better and wrote a sentence in clinic with 100% legibility     OT LONG TERM GOAL #4   Title Patient will reach into overhead cabinet to obtain a lightweight object (less than 3 lb) with RUE    Time 12    Period Weeks    Status On-going   pt with increased pain in RUE shoulder     OT LONG TERM GOAL #5   Title Patient will utilize mallet with RUE to tap to open joints of chair in furniture restoration project    Time 12    Period Weeks    Status On-going   still working towards increasing strength for using mallet     OT LONG TERM GOAL #6   Title Patient will demonstrate understanding of recommendations relating to retunring to driving    Time 12    Period Weeks    Status Achieved                   Plan - 07/01/20 1515     Clinical Impression Statement Patient with significant improvement in right arm pain following injection to right shoulder, and recent start on Baclofen.    OT Occupational Profile and History Detailed Assessment- Review of  Records and additional review of physical, cognitive, psychosocial history related to current functional performance    Occupational performance deficits (Please refer to evaluation for details): ADL's;IADL's;Work    Body Structure / Function / Physical Skills ADL;Coordination;Endurance;GMC;UE functional use;Balance;Decreased knowledge of precautions;Fascial  restriction;Pain;IADL;Flexibility;Decreased knowledge of use of DME;Body mechanics;Dexterity;FMC;Strength;Tone;ROM    Rehab Potential Excellent    Clinical Decision Making Several treatment options, min-mod task modification necessary    Comorbidities Affecting Occupational Performance: May have comorbidities impacting occupational performance    Modification or Assistance to Complete Evaluation  Min-Moderate modification of tasks or assist with assess necessary to complete eval    OT Frequency 2x / week    OT Duration 8 weeks    OT Treatment/Interventions Self-care/ADL training;Electrical Stimulation;Therapeutic exercise;Aquatic Therapy;Moist Heat;Neuromuscular education;Compression bandaging;Splinting;Patient/family education;Balance training;Therapeutic activities;Functional Mobility Training;Fluidtherapy;Cryotherapy;Ultrasound;Contrast Bath;DME and/or AE instruction;Manual Therapy;Passive range of motion    Plan NMR Proximal strengthening, mechanics for reaching, postural set.  Arm moving in isolation of body    OT Home Exercise Plan coordination HEP, putty HEP, standing rocking for shoulder ranges    Consulted and Agree with Plan of Care Patient             Patient will benefit from skilled therapeutic intervention in order to improve the following deficits and impairments:   Body Structure / Function / Physical Skills: ADL, Coordination, Endurance, GMC, UE functional use, Balance, Decreased knowledge of precautions, Fascial restriction, Pain, IADL, Flexibility, Decreased knowledge of use of DME, Body mechanics, Dexterity, FMC, Strength, Tone, ROM       Visit Diagnosis: Hemiplegia and hemiparesis following cerebral infarction affecting right dominant side (HCC)  Abnormal posture  Pain in right wrist  Localized edema  Unsteadiness on feet  Muscle weakness (generalized)    Problem List Patient Active Problem List   Diagnosis Date Noted   Pain in joint of right shoulder  06/24/2020   Spasticity as late effect of cerebrovascular accident (CVA) 04/22/2020   Elevated BUN    Sleep disturbance    Infarction of left basal ganglia (HCC) 02/21/2020   Right hemiparesis (HCC)    Leukocytosis    Sinus tachycardia    Dyslipidemia    History of gout    Left sided lacunar infarction ([facility]) 02/16/2020   Chronic hepatitis C without hepatic coma ([facility]) 01/20/2016   Lumbar disc disease with radiculopathy 03/07/2012   Essential hypertension 12/14/2009   RHINITIS 03/26/2008   Gout 01/15/2007   ERECTILE DYSFUNCTION 01/15/2007   CARPAL TUNNEL SYNDROME, RIGHT 01/15/2007    Mariah Milling 07/01/2020, 3:19 PM  [facility] [address] [place], [place], [address] Phone: [phone]   Fax:  [phone]  Name: Brent Dann Sr. MRN: [id number] Date of Birth: 04-07-1950

## 2020-07-01 NOTE — Therapy (Signed)
[facility] [address] [place], [place], [address] Phone: [phone]   Fax:  [phone]  Physical Therapy Treatment/Discharge Summary  Patient Details  Name: Brent Alyea Sr. MRN: [id number] Date of Birth: 1950/02/15 Referring Provider (PT): Lavon Paganini Angiulli, PA-C  PHYSICAL THERAPY DISCHARGE SUMMARY  Visits from Start of Care: 19  Current functional level related to goals / functional outcomes: See Clinical Impression Statement   Remaining deficits: Low Fall Risk   Education / Equipment: HEP Provided   Patient agrees to discharge. Patient goals were met. Patient is being discharged due to meeting the stated rehab goals.  Encounter Date: 07/01/2020   PT End of Session - 07/01/20 1316     Visit Number 19    Number of Visits 20    Date for PT Re-Evaluation 62/22/97   8 week cert, 6 week poc   Authorization Type [employer]; $20 copay, medicare secondary also    PT Start Time 1317    PT Stop Time 1357    PT Time Calculation (min) 40 min    Equipment Utilized During Treatment Gait belt    Activity Tolerance Patient tolerated treatment well    Behavior During Therapy WFL for tasks assessed/performed             Past Medical History:  Diagnosis Date   Carpal tunnel syndrome    Gout    Hyperlipidemia    on meds   Hypertension    on meds    Past Surgical History:  Procedure Laterality Date   COLONOSCOPY  2015   DJ-MAC-polyps   LOOP RECORDER INSERTION N/A 02/19/2020   Procedure: LOOP RECORDER INSERTION;  Surgeon: Constance Haw, MD;  Location: [facility];  Service: Cardiovascular;  Laterality: N/A;   no prior surgery     POLYPECTOMY  2015   DJ-MAC-polyps    There were no vitals filed for this visit.   Subjective Assessment - 07/01/20 1318     Subjective Patient reports saw Dr. Dagoberto Ligas, recieved shots and started on Baclofen. Reports forearm is feeling better. No falls or pain  today. Denies any other new changes.    Patient is accompained by: Family member    Pertinent History carpal tunnel, HTN, HLD, DM, gout, and former smoker.    Patient Stated Goals Increase use of RUE; walk without the walker.    Currently in Pain? Yes    Pain Score 0-No pain    Pain Location Shoulder    Pain Orientation Right    Pain Descriptors / Indicators Aching    Pain Type Chronic pain    Pain Onset --                [facility] PT Assessment - 07/01/20 0001       Observation/Other Assessments   Focus on Therapeutic Outcomes (FOTO)  Stroke LE: 74%      Functional Gait  Assessment   Gait assessed  Yes    Gait Level Surface Walks 20 ft in less than 7 sec but greater than 5.5 sec, uses assistive device, slower speed, mild gait deviations, or deviates 6-10 in outside of the 12 in walkway width.    Change in Gait Speed Able to smoothly change walking speed without loss of balance or gait deviation. Deviate no more than 6 in outside of the 12 in walkway width.    Gait with Horizontal Head Turns Performs head turns smoothly with no change in gait. Deviates no more  than 6 in outside 12 in walkway width    Gait with Vertical Head Turns Performs head turns with no change in gait. Deviates no more than 6 in outside 12 in walkway width.    Gait and Pivot Turn Pivot turns safely within 3 sec and stops quickly with no loss of balance.    Step Over Obstacle Is able to step over 2 stacked shoe boxes taped together (9 in total height) without changing gait speed. No evidence of imbalance.    Gait with Narrow Base of Support Ambulates 7-9 steps.    Gait with Eyes Closed Walks 20 ft, uses assistive device, slower speed, mild gait deviations, deviates 6-10 in outside 12 in walkway width. Ambulates 20 ft in less than 9 sec but greater than 7 sec.    Ambulating Backwards Walks 20 ft, uses assistive device, slower speed, mild gait deviations, deviates 6-10 in outside 12 in walkway width.    Steps  Alternating feet, must use rail.    Total Score 25    FGA comment: 25/30 = Low Fall RIsk               [facility] Adult PT Treatment/Exercise - 07/01/20 0001       Transfers   Transfers Sit to Stand;Stand to Sit    Sit to Stand 6: Modified independent (Device/Increase time)    Stand to Sit 6: Modified independent (Device/Increase time)      Ambulation/Gait   Ambulation/Gait Yes    Ambulation/Gait Assistance 6: Modified independent (Device/Increase time)    Ambulation/Gait Assistance Details ambulation throughout therapy session    Assistive device None    Gait Pattern Step-through pattern;Decreased step length - right    Ambulation Surface Level;Indoor      Neuro Re-ed    Neuro Re-ed Details  Completed Single Leg Stance without UE support, LLE: 20 seconds, RLE: 19.75 secs.      Exercises   Exercises Other Exercises    Other Exercises  Completed review of HEP.             Reviewed the following HEP and progressed to patient's tolerance:  Access Code: [email or web address] URL: [email or web address] Date: 05/27/2020 Prepared by: Cherly Anderson   Exercises Walking March - 2 x daily - 7 x weekly - 1 sets - 3-4 reps - added green theraband Backward Walking with Counter Support - 2 x daily - 7 x weekly - 1 sets - 3-4 reps Standing Single Leg Stance with Counter Support - 1 x daily - 7 x weekly - 1 sets - 3 reps - 20-30 sec hold Sit to Stand with Right Leg Back - stand on pillow - 1 x daily - 7 x weekly - 2 sets - 10 reps Lateral Step Up - 1 x daily - 7 x weekly - 2 sets - 10 reps Reverse Band Walks - 2 x daily - 5 x weekly - 1 sets - 2-3 reps Band Walks - 2 x daily - 5 x weekly - 1 sets - 2-3 reps Supine Lower Trunk Rotation - 2 x daily - 7 x weekly - 1 sets - 4 reps - 10 sec hold      PT Education - 07/01/20 1322     Education Details progress toward LTGs; Progressive HEP    Person(s) Educated Patient    Methods Explanation    Comprehension Verbalized  understanding              PT Short Term Goals - 06/10/20  [place] #1   Title Pt will be instructed in ways to manage low back pain including stretching to improve flexibility with reports of <4/10 pain after activities.    Baseline 5/10 with activities >1 hour. 06/10/20 no back pain currently. The worst it has been in past week is 3/10    Time 3    Period Weeks    Status Achieved    Target Date 06/11/20               PT Long Term Goals - 07/01/20 1321       PT LONG TERM GOAL #1   Title Pt will be independent with final HEP  (ALL LTG DUE BY 05/16/20)    Baseline 05/20/20 Pt doing well with current HEP. PT continues to update; reports independence with updated HEP    Time 6    Period Weeks    Status Achieved      PT LONG TERM GOAL #2   Title Pt will report overall improvement in function on FOTO to >/= 64%    Baseline 50%. continuing with PT; 74%    Time 6    Period Weeks    Status Achieved      PT LONG TERM GOAL #3   Title Pt will ambulate on varied surfaces >1000' without AD independently with noted improved trunk rotation, arm swing and right toe off.    Baseline 05/20/20 850' supervision on varied surfaces more for cuing, mod I with steps with 1 rail; 1200 ft with normal gait pattern Mod I no AD    Time 6    Period Weeks    Status Achieved      PT LONG TERM GOAL #4   Title Pt will increase SLS time to >7 sec bilateral for improved balance.    Baseline 05/20/20 5 sec bilateral; SLS LLE: 20 secs, RLE: 19.75    Time 6    Period Weeks    Status Achieved      PT LONG TERM GOAL #5   Title Pt will increase FGA from 21 to >24/30 for improved balance and gait safety.    Baseline 05/20/20 21/30; 25/30    Time 6    Period Weeks    Status Achieved                   Plan - 07/01/20 1358     Clinical Impression Statement Completed assesment of patient's progress toward LTG, patient able to meet all LTGs today demonstrating improved balance,  reduced fall risk, and improved gait. Patient improved SLS on BLE > 19 seconds. Patient scored a 25/30, indicating low fall risk at this time. Reviewed and progressed HEP prior to d/c, with patient verbalizing independence. Patient demonstrating significant progress with PT services and demonstrating readiness for d/c at this time. Patient verbalizing agreement to d/c.    Personal Factors and Comorbidities Comorbidity 3+;Profession    Comorbidities carpal tunnel, HTN, HLD, DM, gout, and former smoker    Examination-Activity Limitations Locomotion Level;Transfers;Stairs    Examination-Participation Restrictions Driving;Occupation;Community Activity    Stability/Clinical Decision Making Evolving/Moderate complexity    Rehab Potential Good    PT Frequency 1x / week    PT Duration 6 weeks    PT Treatment/Interventions ADLs/Self Care Home Management;Aquatic Therapy;Cryotherapy;Electrical Stimulation;Moist Heat;DME Instruction;Gait training;Stair training;Functional mobility training;Therapeutic activities;Therapeutic exercise;Balance training;Neuromuscular re-education;Patient/family education;Orthotic Fit/Training;Manual techniques;Passive range of motion    Consulted and Agree with Plan  of Care Patient;Family member/caregiver    Family Member Consulted wife             Patient will benefit from skilled therapeutic intervention in order to improve the following deficits and impairments:  Abnormal gait, Decreased balance, Decreased strength, Pain  Visit Diagnosis: Muscle weakness (generalized)  Unsteadiness on feet  Other abnormalities of gait and mobility     Problem List Patient Active Problem List   Diagnosis Date Noted   Pain in joint of right shoulder 06/24/2020   Spasticity as late effect of cerebrovascular accident (CVA) 04/22/2020   Elevated BUN    Sleep disturbance    Infarction of left basal ganglia (HCC) 02/21/2020   Right hemiparesis (HCC)    Leukocytosis    Sinus  tachycardia    Dyslipidemia    History of gout    Left sided lacunar infarction ([facility]) 02/16/2020   Chronic hepatitis C without hepatic coma ([facility]) 01/20/2016   Lumbar disc disease with radiculopathy 03/07/2012   Essential hypertension 12/14/2009   RHINITIS 03/26/2008   Gout 01/15/2007   ERECTILE DYSFUNCTION 01/15/2007   CARPAL TUNNEL SYNDROME, RIGHT 01/15/2007    Jones Bales, PT, DPT 07/01/2020, 3:35 PM  [facility] [address] [place], [place], [address] Phone: [phone]   Fax:  [phone]  Name: Brent Garciagarcia Sr. MRN: [id number] Date of Birth: [birth year]

## 2020-07-06 ENCOUNTER — Ambulatory Visit (INDEPENDENT_AMBULATORY_CARE_PROVIDER_SITE_OTHER): Payer: 59

## 2020-07-06 ENCOUNTER — Ambulatory Visit: Payer: 59 | Admitting: Occupational Therapy

## 2020-07-06 DIAGNOSIS — M6281 Muscle weakness (generalized): Secondary | ICD-10-CM | POA: Diagnosis not present

## 2020-07-06 DIAGNOSIS — M25531 Pain in right wrist: Secondary | ICD-10-CM | POA: Diagnosis not present

## 2020-07-06 DIAGNOSIS — I69351 Hemiplegia and hemiparesis following cerebral infarction affecting right dominant side: Secondary | ICD-10-CM | POA: Diagnosis not present

## 2020-07-06 DIAGNOSIS — R2689 Other abnormalities of gait and mobility: Secondary | ICD-10-CM | POA: Diagnosis not present

## 2020-07-06 DIAGNOSIS — R2681 Unsteadiness on feet: Secondary | ICD-10-CM | POA: Diagnosis not present

## 2020-07-06 DIAGNOSIS — R293 Abnormal posture: Secondary | ICD-10-CM | POA: Diagnosis not present

## 2020-07-06 DIAGNOSIS — I639 Cerebral infarction, unspecified: Secondary | ICD-10-CM

## 2020-07-06 DIAGNOSIS — R600 Localized edema: Secondary | ICD-10-CM | POA: Diagnosis not present

## 2020-07-06 LAB — CUP PACEART REMOTE DEVICE CHECK
Date Time Interrogation Session: 20220620091306
Implantable Pulse Generator Implant Date: 20220202

## 2020-07-07 ENCOUNTER — Encounter: Payer: Self-pay | Admitting: Occupational Therapy
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
## 2020-07-08 ENCOUNTER — Ambulatory Visit: Payer: 59 | Admitting: Occupational Therapy

## 2020-07-08 ENCOUNTER — Ambulatory Visit: Payer: 59 | Admitting: Physical Therapy

## 2020-07-08 ENCOUNTER — Ambulatory Visit: Payer: 59 | Admitting: Adult Health

## 2020-07-09 ENCOUNTER — Encounter: Payer: 59 | Admitting: Occupational Therapy

## 2020-07-15 ENCOUNTER — Ambulatory Visit: Payer: 59

## 2020-07-17 ENCOUNTER — Other Ambulatory Visit (HOSPITAL_COMMUNITY): Payer: Self-pay

## 2020-07-17 MED FILL — Colchicine Tab 0.6 MG: ORAL | 90 days supply | Qty: 180 | Fill #0 | Status: AC

## 2020-07-22 ENCOUNTER — Other Ambulatory Visit (HOSPITAL_COMMUNITY): Payer: Self-pay

## 2020-07-23 ENCOUNTER — Encounter: Payer: Self-pay | Admitting: Occupational Therapy

## 2020-07-23 ENCOUNTER — Other Ambulatory Visit: Payer: Self-pay

## 2020-07-23 ENCOUNTER — Ambulatory Visit: Payer: 59

## 2020-07-23 ENCOUNTER — Ambulatory Visit: Payer: 59 | Attending: Physician Assistant | Admitting: Occupational Therapy

## 2020-07-23 DIAGNOSIS — R2681 Unsteadiness on feet: Secondary | ICD-10-CM | POA: Diagnosis present

## 2020-07-23 DIAGNOSIS — M25531 Pain in right wrist: Secondary | ICD-10-CM | POA: Insufficient documentation

## 2020-07-23 DIAGNOSIS — R293 Abnormal posture: Secondary | ICD-10-CM | POA: Insufficient documentation

## 2020-07-23 DIAGNOSIS — R600 Localized edema: Secondary | ICD-10-CM | POA: Diagnosis present

## 2020-07-23 DIAGNOSIS — M6281 Muscle weakness (generalized): Secondary | ICD-10-CM | POA: Diagnosis present

## 2020-07-23 DIAGNOSIS — I69351 Hemiplegia and hemiparesis following cerebral infarction affecting right dominant side: Secondary | ICD-10-CM | POA: Diagnosis not present

## 2020-07-24 NOTE — Therapy (Signed)
[facility] [address] [place], [place], [address] Phone: [phone]   Fax:  [phone]  Occupational Therapy Treatment  Patient Details  Name: Brent Berg Sr. MRN: [id number] Date of Birth: [birth year] Referring Provider (OT): Marlowe Shores   Encounter Date: 07/23/2020   OT End of Session - 07/23/20 1621     Visit Number 26    Number of Visits 37    Date for OT Re-Evaluation 08/24/20    Authorization Type [facility]    Authorization Time Period week 5 of 8 (7/7)    Progress Note Due on Visit 30    OT Start Time 1615    OT Stop Time 1700    OT Time Calculation (min) 45 min    Activity Tolerance Patient tolerated treatment well    Behavior During Therapy WFL for tasks assessed/performed             Past Medical History:  Diagnosis Date   Carpal tunnel syndrome    Gout    Hyperlipidemia    on meds   Hypertension    on meds    Past Surgical History:  Procedure Laterality Date   COLONOSCOPY  2015   DJ-MAC-polyps   LOOP RECORDER INSERTION N/A 02/19/2020   Procedure: LOOP RECORDER INSERTION;  Surgeon: Constance Haw, MD;  Location: [facility];  Service: Cardiovascular;  Laterality: N/A;   no prior surgery     POLYPECTOMY  2015   DJ-MAC-polyps    There were no vitals filed for this visit.   Subjective Assessment - 07/23/20 1619     Subjective  "I was able to do a lot of physical work this last weekend working on my deck"    Currently in Pain? No/denies              OT Treatment/Exercises - 07/23/20    Weight Shifting Slow and controlled weight shifting down to R forearm while sitting EOM to facilitate strengthening and inhibition of increased tone in RUE. OT provided tactile cueing for shoulder alignment during exercise. Pt encouraged to return to full elbow extension w/ some limitations and occasional cueing required to avoid compensatory scapular elevation.    Development of  Functional Reach BUE AAROM of shoulder flexion while holding medium un-weighted ball. Pt educated on not pushing past point of discomfort and of maintaining good alignment/shoulder girdle positioning (I.e. avoiding scapular elevation) when facilitating movement. Pt able to complete exercise w/out add'l pain.    Scapular Stabilization Wall clocks in standing position w/ yellow theraband to facilitate scapular strength and stability of RUE. Pt able to complete w/out difficulty or add'l pain. Completed 2nd set w/out band to avoid discomfort and/or compensatory patterns              OT Short Term Goals - 07/07/20 0711       OT SHORT TERM GOAL #1   Title Patient will complete a home exercise program designed to improve RUE range of motion    Period Weeks    Status Achieved    Target Date 05/02/20      OT SHORT TERM GOAL #2   Title Patient will utilize RUE to grasp release feeding utensil with min assist    Time 4    Period Weeks    Status Achieved      OT SHORT TERM GOAL #3   Title Patient will tie shoes bimanually with increased time and min assist    Time  4    Period Weeks    Status Achieved      OT SHORT TERM GOAL #4   Title Patient will zipper light weight jacket, or button 2 buttons on shirt with increased time and min assist    Time 4    Period Weeks    Status Achieved      OT SHORT TERM GOAL #5   Title Patient will utilize BUE to wash a lightweight dish while standing at sink    Time 4    Period Weeks    Status Achieved      OT SHORT TERM GOAL #6   Title Patient will demonstrate mid level reach in modified closed to open chain to obtain a 1lb or less weighted object with RUE    Time 4    Period Weeks    Status New    Target Date 07/10/20              OT Long Term Goals - 07/07/20 0712       OT LONG TERM GOAL #1   Title Patient will complete and updated HEP to address RUE ROM and functional strength    Time 12    Period Weeks    Status On-going    continue to add PRN     OT LONG TERM GOAL #2   Title Patient will utilize BUE to weave chair seating (caning) with increased time and intermittent support    Time 12    Period Weeks    Status Achieved   has not tried yet - will continue to work towards coordination with [place] #3   Title Patient will write a full sentence with RUE legibly    Time 8    Period Weeks    Status Achieved   pt reports it is better and wrote a sentence in clinic with 100% legibility     OT LONG TERM GOAL #4   Title Patient will reach into overhead cabinet to obtain a lightweight object (less than 3 lb) with RUE    Time 12    Period Weeks    Status On-going   pt with increased pain in RUE shoulder     OT LONG TERM GOAL #5   Title Patient will utilize mallet with RUE to tap to open joints of chair in furniture restoration project    Time 12    Period Weeks    Status On-going   still working towards increasing strength for using mallet     OT LONG TERM GOAL #6   Title Patient will demonstrate understanding of recommendations relating to retunring to driving    Time 12    Period Weeks    Status Achieved              Plan - 07/23/20 1623     Clinical Impression Statement Pt continues to experience decreased pain in RUE s/p injection/med adjustment and reports feeling like he is using his R hand more functionally. Session focused on neuro reed of RUE to continue w/ progress toward functional goals, facilitating RUE stability/strength and development of functional reach. Pt able to tolerate exercises w/out add'l pain or discomfort.   OT Occupational Profile and History Detailed Assessment- Review of Records and additional review of physical, cognitive, psychosocial history related to current functional performance    Occupational performance deficits (Please refer to evaluation for details): ADL's;IADL's;Work    Body Structure /  Function / Physical Skills  ADL;Coordination;Endurance;GMC;UE functional use;Balance;Decreased knowledge of precautions;Fascial restriction;Pain;IADL;Flexibility;Decreased knowledge of use of DME;Body mechanics;Dexterity;FMC;Strength;Tone;ROM    Rehab Potential Excellent    Clinical Decision Making Several treatment options, min-mod task modification necessary    Comorbidities Affecting Occupational Performance: May have comorbidities impacting occupational performance    Modification or Assistance to Complete Evaluation  Min-Moderate modification of tasks or assist with assess necessary to complete eval    OT Frequency 2x / week    OT Duration 8 weeks    OT Treatment/Interventions Self-care/ADL training;Electrical Stimulation;Therapeutic exercise;Aquatic Therapy;Moist Heat;Neuromuscular education;Compression bandaging;Splinting;Patient/family education;Balance training;Therapeutic activities;Functional Mobility Training;Fluidtherapy;Cryotherapy;Ultrasound;Contrast Bath;DME and/or AE instruction;Manual Therapy;Passive range of motion    Plan Shoulder range of motion- NMR Proximal strengthening, mechanics for reaching, postural set. Arm moving in isolation of body    OT Home Exercise Plan coordination HEP, putty HEP, standing rocking for shoulder ranges    Consulted and Agree with Plan of Care Patient             Patient will benefit from skilled therapeutic intervention in order to improve the following deficits and impairments:   Body Structure / Function / Physical Skills: ADL, Coordination, Endurance, GMC, UE functional use, Balance, Decreased knowledge of precautions, Fascial restriction, Pain, IADL, Flexibility, Decreased knowledge of use of DME, Body mechanics, Dexterity, FMC, Strength, Tone, ROM       Visit Diagnosis: Hemiplegia and hemiparesis following cerebral infarction affecting right dominant side (HCC)  Abnormal posture  Muscle weakness (generalized)  Unsteadiness on feet    Problem  List Patient Active Problem List   Diagnosis Date Noted   Pain in joint of right shoulder 06/24/2020   Spasticity as late effect of cerebrovascular accident (CVA) 04/22/2020   Elevated BUN    Sleep disturbance    Infarction of left basal ganglia (HCC) 02/21/2020   Right hemiparesis (HCC)    Leukocytosis    Sinus tachycardia    Dyslipidemia    History of gout    Left sided lacunar infarction ([facility]) 02/16/2020   Chronic hepatitis C without hepatic coma ([facility]) 01/20/2016   Lumbar disc disease with radiculopathy 03/07/2012   Essential hypertension 12/14/2009   RHINITIS 03/26/2008   Gout 01/15/2007   ERECTILE DYSFUNCTION 01/15/2007   CARPAL TUNNEL SYNDROME, RIGHT 01/15/2007     Kathrine Cords, OTR/L, MSOT  07/23/2020, 5:00 PM  [facility] [address] [place], [place], [address] Phone: [phone]   Fax:  [phone]  Name: Brent Snowball Sr. MRN: [id number] Date of Birth: 21-Nov-1950

## 2020-07-24 NOTE — Progress Notes (Signed)
[facility] Summary Report / Loop Recorder 

## 2020-07-24 NOTE — Addendum Note (Signed)
Addended by: Douglass Rivers D on: 07/24/2020 12:20 PM   Modules accepted: Level of Service

## 2020-07-27 ENCOUNTER — Encounter: Payer: Self-pay | Admitting: Occupational Therapy

## 2020-07-27 ENCOUNTER — Ambulatory Visit: Payer: 59 | Admitting: Occupational Therapy

## 2020-07-27 DIAGNOSIS — R293 Abnormal posture: Secondary | ICD-10-CM | POA: Diagnosis not present

## 2020-07-27 DIAGNOSIS — M6281 Muscle weakness (generalized): Secondary | ICD-10-CM | POA: Diagnosis not present

## 2020-07-27 DIAGNOSIS — R600 Localized edema: Secondary | ICD-10-CM | POA: Diagnosis not present

## 2020-07-27 DIAGNOSIS — R2681 Unsteadiness on feet: Secondary | ICD-10-CM | POA: Diagnosis not present

## 2020-07-27 DIAGNOSIS — I69351 Hemiplegia and hemiparesis following cerebral infarction affecting right dominant side: Secondary | ICD-10-CM | POA: Diagnosis not present

## 2020-07-27 DIAGNOSIS — M25531 Pain in right wrist: Secondary | ICD-10-CM | POA: Diagnosis not present

## 2020-07-27 NOTE — Therapy (Signed)
[facility] [address] [place], [place], [address] Phone: [phone]   Fax:  [phone]  Occupational Therapy Treatment  Patient Details  Name: Brent Giraud Sr. MRN: [id number] Date of Birth: 1950-04-02 Referring Provider (OT): Marlowe Shores   Encounter Date: 07/27/2020   OT End of Session - 07/27/20 1919     Visit Number 27    Number of Visits 37    Date for OT Re-Evaluation 08/24/20    Authorization Type [facility]    Authorization Time Period week 6 of 8 (7/11)    Progress Note Due on Visit 30    OT Start Time 1545    OT Stop Time 1630    OT Time Calculation (min) 45 min    Activity Tolerance Patient tolerated treatment well    Behavior During Therapy WFL for tasks assessed/performed             Past Medical History:  Diagnosis Date   Carpal tunnel syndrome    Gout    Hyperlipidemia    on meds   Hypertension    on meds    Past Surgical History:  Procedure Laterality Date   COLONOSCOPY  2015   DJ-MAC-polyps   LOOP RECORDER INSERTION N/A 02/19/2020   Procedure: LOOP RECORDER INSERTION;  Surgeon: Constance Haw, MD;  Location: [facility];  Service: Cardiovascular;  Laterality: N/A;   no prior surgery     POLYPECTOMY  2015   DJ-MAC-polyps    There were no vitals filed for this visit.   Subjective Assessment - 07/27/20 1916     Subjective  Patient was able to complete a large furniture restoration project with the help of his brother.    Currently in Pain? No/denies    Pain Score 0-No pain               Patient seen for aquatic therapy visit.  Patient entered and exited pool via stairs independently.   Session occurred in 3.5-4.5 ft of water.   Worked initially to improve range of motion, then strength for shoulder flexion/extension/abduction/adduction/scapular stabilizers.  Followed with guided overhead reach.  Patient has demonstrated significant improvement in ability  to flex shoulder beyond 130 degrees flexion.                        OT Short Term Goals - 07/27/20 1924       OT SHORT TERM GOAL #1   Title Patient will complete a home exercise program designed to improve RUE range of motion    Period Weeks    Status Achieved    Target Date 05/02/20      OT SHORT TERM GOAL #2   Title Patient will utilize RUE to grasp release feeding utensil with min assist    Time 4    Period Weeks    Status Achieved      OT SHORT TERM GOAL #3   Title Patient will tie shoes bimanually with increased time and min assist    Time 4    Period Weeks    Status Achieved      OT SHORT TERM GOAL #4   Title Patient will zipper light weight jacket, or button 2 buttons on shirt with increased time and min assist    Time 4    Period Weeks    Status Achieved      OT SHORT TERM GOAL #5   Title Patient will  utilize BUE to wash a lightweight dish while standing at sink    Time 4    Period Weeks    Status Achieved      OT SHORT TERM GOAL #6   Title Patient will demonstrate mid level reach in modified closed to open chain to obtain a 1lb or less weighted object with RUE    Time 4    Period Weeks    Status New    Target Date 07/10/20               OT Long Term Goals - 07/27/20 1925       OT LONG TERM GOAL #1   Title Patient will complete and updated HEP to address RUE ROM and functional strength    Time 12    Period Weeks    Status On-going   continue to add PRN     OT LONG TERM GOAL #2   Title Patient will utilize BUE to weave chair seating (caning) with increased time and intermittent support    Time 12    Period Weeks    Status Achieved   has not tried yet - will continue to work towards coordination with [place] #3   Title Patient will write a full sentence with RUE legibly    Time 8    Period Weeks    Status Achieved   pt reports it is better and wrote a sentence in clinic with 100% legibility     OT LONG  TERM GOAL #4   Title Patient will reach into overhead cabinet to obtain a lightweight object (less than 3 lb) with RUE    Time 12    Period Weeks    Status On-going   pt with increased pain in RUE shoulder     OT LONG TERM GOAL #5   Title Patient will utilize mallet with RUE to tap to open joints of chair in furniture restoration project    Time 12    Period Weeks    Status On-going   still working towards increasing strength for using mallet     OT LONG TERM GOAL #6   Title Patient will demonstrate understanding of recommendations relating to retunring to driving    Time 12    Period Weeks    Status Achieved                   Plan - 07/27/20 1921     Clinical Impression Statement Pt continues to report no pain in right shoulder, and improved functioning in right hand and arm.    OT Occupational Profile and History Detailed Assessment- Review of Records and additional review of physical, cognitive, psychosocial history related to current functional performance    Occupational performance deficits (Please refer to evaluation for details): ADL's;IADL's;Work    Body Structure / Function / Physical Skills ADL;Coordination;Endurance;GMC;UE functional use;Balance;Decreased knowledge of precautions;Fascial restriction;Pain;IADL;Flexibility;Decreased knowledge of use of DME;Body mechanics;Dexterity;FMC;Strength;Tone;ROM    Rehab Potential Excellent    Clinical Decision Making Several treatment options, min-mod task modification necessary    Comorbidities Affecting Occupational Performance: May have comorbidities impacting occupational performance    Modification or Assistance to Complete Evaluation  Min-Moderate modification of tasks or assist with assess necessary to complete eval    OT Frequency 2x / week    OT Duration 8 weeks    OT Treatment/Interventions Self-care/ADL training;Electrical Stimulation;Therapeutic exercise;Aquatic Therapy;Moist Heat;Neuromuscular  education;Compression bandaging;Splinting;Patient/family education;Balance training;Therapeutic activities;Functional  Mobility Training;Fluidtherapy;Cryotherapy;Ultrasound;Contrast Bath;DME and/or AE instruction;Manual Therapy;Passive range of motion    Plan Shoulder range of motion- NMR Proximal strengthening, mechanics for reaching, postural set. Arm moving in isolation of body    OT Home Exercise Plan coordination HEP, putty HEP, standing rocking for shoulder ranges    Consulted and Agree with Plan of Care Patient             Patient will benefit from skilled therapeutic intervention in order to improve the following deficits and impairments:   Body Structure / Function / Physical Skills: ADL, Coordination, Endurance, GMC, UE functional use, Balance, Decreased knowledge of precautions, Fascial restriction, Pain, IADL, Flexibility, Decreased knowledge of use of DME, Body mechanics, Dexterity, FMC, Strength, Tone, ROM       Visit Diagnosis: Hemiplegia and hemiparesis following cerebral infarction affecting right dominant side (HCC)  Abnormal posture  Muscle weakness (generalized)  Unsteadiness on feet    Problem List Patient Active Problem List   Diagnosis Date Noted   Pain in joint of right shoulder 06/24/2020   Spasticity as late effect of cerebrovascular accident (CVA) 04/22/2020   Elevated BUN    Sleep disturbance    Infarction of left basal ganglia (HCC) 02/21/2020   Right hemiparesis (HCC)    Leukocytosis    Sinus tachycardia    Dyslipidemia    History of gout    Left sided lacunar infarction ([facility]) 02/16/2020   Chronic hepatitis C without hepatic coma ([facility]) 01/20/2016   Lumbar disc disease with radiculopathy 03/07/2012   Essential hypertension 12/14/2009   RHINITIS 03/26/2008   Gout 01/15/2007   ERECTILE DYSFUNCTION 01/15/2007   CARPAL TUNNEL SYNDROME, RIGHT 01/15/2007    Mariah Milling 07/27/2020, 7:27 PM  [facility] [address] [place], [place], [address] Phone: [phone]   Fax:  [phone]  Name: Brent Halpin Sr. MRN: [id number] Date of Birth: [birth year]

## 2020-07-28 ENCOUNTER — Other Ambulatory Visit (HOSPITAL_COMMUNITY): Payer: Self-pay

## 2020-07-29 ENCOUNTER — Ambulatory Visit: Payer: 59

## 2020-07-29 ENCOUNTER — Encounter: Payer: Self-pay | Admitting: Occupational Therapy

## 2020-07-29 ENCOUNTER — Other Ambulatory Visit: Payer: Self-pay

## 2020-07-29 ENCOUNTER — Ambulatory Visit: Payer: 59 | Admitting: Occupational Therapy

## 2020-07-29 DIAGNOSIS — R293 Abnormal posture: Secondary | ICD-10-CM

## 2020-07-29 DIAGNOSIS — M25531 Pain in right wrist: Secondary | ICD-10-CM | POA: Diagnosis not present

## 2020-07-29 DIAGNOSIS — R2681 Unsteadiness on feet: Secondary | ICD-10-CM | POA: Diagnosis not present

## 2020-07-29 DIAGNOSIS — I69351 Hemiplegia and hemiparesis following cerebral infarction affecting right dominant side: Secondary | ICD-10-CM

## 2020-07-29 DIAGNOSIS — M6281 Muscle weakness (generalized): Secondary | ICD-10-CM | POA: Diagnosis not present

## 2020-07-29 DIAGNOSIS — R600 Localized edema: Secondary | ICD-10-CM

## 2020-07-29 NOTE — Therapy (Signed)
[facility] [address] [place], [place], [address] Phone: [phone]   Fax:  [phone]  Occupational Therapy Treatment  Patient Details  Name: Brent Kutch Sr. MRN: [id number] Date of Birth: April 09, 1950 Referring Provider (OT): Marlowe Shores   Encounter Date: 07/29/2020   OT End of Session - 07/29/20 1636     Visit Number 28    Number of Visits 37    Date for OT Re-Evaluation 08/24/20    Authorization Time Period week 6 of 8 (7/11)    Progress Note Due on Visit 30    OT Start Time 1534    OT Stop Time 1620    OT Time Calculation (min) 46 min    Activity Tolerance Patient tolerated treatment well    Behavior During Therapy WFL for tasks assessed/performed             Past Medical History:  Diagnosis Date   Carpal tunnel syndrome    Gout    Hyperlipidemia    on meds   Hypertension    on meds    Past Surgical History:  Procedure Laterality Date   COLONOSCOPY  2015   DJ-MAC-polyps   LOOP RECORDER INSERTION N/A 02/19/2020   Procedure: LOOP RECORDER INSERTION;  Surgeon: Constance Haw, MD;  Location: [facility];  Service: Cardiovascular;  Laterality: N/A;   no prior surgery     POLYPECTOMY  2015   DJ-MAC-polyps    There were no vitals filed for this visit.   Subjective Assessment - 07/29/20 1628     Subjective  Patient reports she was able to obtain the bread from the cupboard (reaching up) without difficulty    Currently in Pain? No/denies    Pain Score 0-No pain                          OT Treatments/Exercises (OP) - 07/29/20 0001       Neurological Re-education Exercises   Other Exercises 1 Neuromuscular reeducation for high reach.  Patient has end range and capsular tightness limiting full range of motion in right shoulder.  Worked on active stretchingwith support above 140 degrees - followed with activation with facilitation for scap depression for distal  humeral elevation.  Patient able to achieve 165 degrees of shoulder flexion today in supine.  Followed with functional reaching into higher cabinets with lightweight objects.  Self stretch to right shoulder with ball on wall and min cueing.    Other Exercises 2 dIGIFLEX 3,5 LB, gripper on second setting black spring - x 15 blocks.  on 3rd spring - increased dropping x 5 blocks then fatigue.                      OT Short Term Goals - 07/29/20 1638       OT SHORT TERM GOAL #1   Title Patient will complete a home exercise program designed to improve RUE range of motion    Period Weeks    Status Achieved    Target Date 05/02/20      OT SHORT TERM GOAL #2   Title Patient will utilize RUE to grasp release feeding utensil with min assist    Time 4    Period Weeks    Status Achieved      OT SHORT TERM GOAL #3   Title Patient will tie shoes bimanually with increased time and min assist  Time 4    Period Weeks    Status Achieved      OT SHORT TERM GOAL #4   Title Patient will zipper light weight jacket, or button 2 buttons on shirt with increased time and min assist    Time 4    Period Weeks    Status Achieved      OT SHORT TERM GOAL #5   Title Patient will utilize BUE to wash a lightweight dish while standing at sink    Time 4    Period Weeks    Status Achieved      OT SHORT TERM GOAL #6   Title Patient will demonstrate mid level reach in modified closed to open chain to obtain a 1lb or less weighted object with RUE    Time 4    Period Weeks    Status New    Target Date 07/10/20               OT Long Term Goals - 07/29/20 1638       OT LONG TERM GOAL #1   Title Patient will complete and updated HEP to address RUE ROM and functional strength    Time 12    Period Weeks    Status On-going   continue to add PRN     OT LONG TERM GOAL #2   Title Patient will utilize BUE to weave chair seating (caning) with increased time and intermittent support    Time  12    Period Weeks    Status Achieved   has not tried yet - will continue to work towards coordination with [place] #3   Title Patient will write a full sentence with RUE legibly    Time 8    Period Weeks    Status Achieved   pt reports it is better and wrote a sentence in clinic with 100% legibility     OT LONG TERM GOAL #4   Title Patient will reach into overhead cabinet to obtain a lightweight object (less than 3 lb) with RUE    Time 12    Period Weeks    Status On-going   pt with increased pain in RUE shoulder     OT LONG TERM GOAL #5   Title Patient will utilize mallet with RUE to tap to open joints of chair in furniture restoration project    Time 12    Period Weeks    Status On-going   still working towards increasing strength for using mallet     OT LONG TERM GOAL #6   Title Patient will demonstrate understanding of recommendations relating to retunring to driving    Time 12    Period Weeks    Status Achieved                   Plan - 07/29/20 1637     Clinical Impression Statement Pt continues to gain right shoulder range of motion and still needs proximal strengthening    OT Occupational Profile and History Detailed Assessment- Review of Records and additional review of physical, cognitive, psychosocial history related to current functional performance    Occupational performance deficits (Please refer to evaluation for details): ADL's;IADL's;Work    Body Structure / Function / Physical Skills ADL;Coordination;Endurance;GMC;UE functional use;Balance;Decreased knowledge of precautions;Fascial restriction;Pain;IADL;Flexibility;Decreased knowledge of use of DME;Body mechanics;Dexterity;FMC;Strength;Tone;ROM    Rehab Potential Excellent    Clinical Decision Making Several treatment options, min-mod  task modification necessary    Comorbidities Affecting Occupational Performance: May have comorbidities impacting occupational performance     Modification or Assistance to Complete Evaluation  Min-Moderate modification of tasks or assist with assess necessary to complete eval    OT Frequency 2x / week    OT Duration 8 weeks    OT Treatment/Interventions Self-care/ADL training;Electrical Stimulation;Therapeutic exercise;Aquatic Therapy;Moist Heat;Neuromuscular education;Compression bandaging;Splinting;Patient/family education;Balance training;Therapeutic activities;Functional Mobility Training;Fluidtherapy;Cryotherapy;Ultrasound;Contrast Bath;DME and/or AE instruction;Manual Therapy;Passive range of motion    Plan Shoulder range of motion- NMR Proximal strengthening, mechanics for reaching, postural set. Arm moving in isolation of body, hand coord/strengthening    OT Home Exercise Plan coordination HEP, putty HEP, standing rocking for shoulder ranges    Consulted and Agree with Plan of Care Patient             Patient will benefit from skilled therapeutic intervention in order to improve the following deficits and impairments:   Body Structure / Function / Physical Skills: ADL, Coordination, Endurance, GMC, UE functional use, Balance, Decreased knowledge of precautions, Fascial restriction, Pain, IADL, Flexibility, Decreased knowledge of use of DME, Body mechanics, Dexterity, FMC, Strength, Tone, ROM       Visit Diagnosis: Hemiplegia and hemiparesis following cerebral infarction affecting right dominant side (HCC)  Abnormal posture  Muscle weakness (generalized)  Unsteadiness on feet  Pain in right wrist  Localized edema    Problem List Patient Active Problem List   Diagnosis Date Noted   Pain in joint of right shoulder 06/24/2020   Spasticity as late effect of cerebrovascular accident (CVA) 04/22/2020   Elevated BUN    Sleep disturbance    Infarction of left basal ganglia (HCC) 02/21/2020   Right hemiparesis (HCC)    Leukocytosis    Sinus tachycardia    Dyslipidemia    History of gout    Left sided lacunar  infarction ([facility]) 02/16/2020   Chronic hepatitis C without hepatic coma ([facility]) 01/20/2016   Lumbar disc disease with radiculopathy 03/07/2012   Essential hypertension 12/14/2009   RHINITIS 03/26/2008   Gout 01/15/2007   ERECTILE DYSFUNCTION 01/15/2007   CARPAL TUNNEL SYNDROME, RIGHT 01/15/2007    Mariah Milling 07/29/2020, 4:39 PM  [facility] [address] [place], [place], [address] Phone: [phone]   Fax:  [phone]  Name: Brent Trebilcock Sr. MRN: [id number] Date of Birth: 09-Jan-1951

## 2020-08-03 ENCOUNTER — Ambulatory Visit: Payer: 59 | Admitting: Occupational Therapy

## 2020-08-03 DIAGNOSIS — R2681 Unsteadiness on feet: Secondary | ICD-10-CM | POA: Diagnosis not present

## 2020-08-03 DIAGNOSIS — R293 Abnormal posture: Secondary | ICD-10-CM | POA: Diagnosis not present

## 2020-08-03 DIAGNOSIS — M6281 Muscle weakness (generalized): Secondary | ICD-10-CM | POA: Diagnosis not present

## 2020-08-03 DIAGNOSIS — I69351 Hemiplegia and hemiparesis following cerebral infarction affecting right dominant side: Secondary | ICD-10-CM | POA: Diagnosis not present

## 2020-08-03 DIAGNOSIS — R600 Localized edema: Secondary | ICD-10-CM | POA: Diagnosis not present

## 2020-08-03 DIAGNOSIS — M25531 Pain in right wrist: Secondary | ICD-10-CM | POA: Diagnosis not present

## 2020-08-04 ENCOUNTER — Encounter: Payer: Self-pay | Admitting: Occupational Therapy

## 2020-08-04 NOTE — Therapy (Signed)
[facility] [address] [place], [place], [address] Phone: [phone]   Fax:  [phone]  Occupational Therapy Treatment  Patient Details  Name: Brent Holifield Sr. MRN: [id number] Date of Birth: 06-Oct-1950 Referring Provider (OT): Marlowe Shores   Encounter Date: 08/03/2020   OT End of Session - 08/04/20 0705     Visit Number 29    Number of Visits 37    Date for OT Re-Evaluation 08/24/20    Authorization Type [facility]    Authorization Time Period week 7 of 8 (7/18)    Progress Note Due on Visit 30    OT Start Time 1545    OT Stop Time 1625    OT Time Calculation (min) 40 min    Activity Tolerance Patient tolerated treatment well    Behavior During Therapy WFL for tasks assessed/performed             Past Medical History:  Diagnosis Date   Carpal tunnel syndrome    Gout    Hyperlipidemia    on meds   Hypertension    on meds    Past Surgical History:  Procedure Laterality Date   COLONOSCOPY  2015   DJ-MAC-polyps   LOOP RECORDER INSERTION N/A 02/19/2020   Procedure: LOOP RECORDER INSERTION;  Surgeon: Constance Haw, MD;  Location: [facility];  Service: Cardiovascular;  Laterality: N/A;   no prior surgery     POLYPECTOMY  2015   DJ-MAC-polyps    There were no vitals filed for this visit.   Subjective Assessment - 08/04/20 0703     Subjective  Patient reports improved use of RUE - now able to bein to use a finishing hammer, and mallet.    Currently in Pain? No/denies    Pain Score 0-No pain             Patient seen for aquatic therapy visit.  Patient entered and exited pool via stairs independently.  Session occurred in 3.5-4.5 ft of water.  Patient reports only occasional RUE pain - but overall this has improved.  Worked on stretching RUE in supine to push end range.  Patient has 90% shoulder flexion following stretching in supine with floatation equipment.  Patient able to use  BUE overhead to complete a supported pull up on high ladder.  Patient tolerating buoyancy resisted exercise with dumbbells (yellow double) for increased demand and resistance training.                         OT Short Term Goals - 08/04/20 0709       OT SHORT TERM GOAL #1   Title Patient will complete a home exercise program designed to improve RUE range of motion    Period Weeks    Status Achieved    Target Date 05/02/20      OT SHORT TERM GOAL #2   Title Patient will utilize RUE to grasp release feeding utensil with min assist    Time 4    Period Weeks    Status Achieved      OT SHORT TERM GOAL #3   Title Patient will tie shoes bimanually with increased time and min assist    Time 4    Period Weeks    Status Achieved      OT SHORT TERM GOAL #4   Title Patient will zipper light weight jacket, or button 2 buttons on shirt with increased time  and min assist    Time 4    Period Weeks    Status Achieved      OT SHORT TERM GOAL #5   Title Patient will utilize BUE to wash a lightweight dish while standing at sink    Time 4    Period Weeks    Status Achieved      OT SHORT TERM GOAL #6   Title Patient will demonstrate mid level reach in modified closed to open chain to obtain a 1lb or less weighted object with RUE    Time 4    Period Weeks    Status Achieved    Target Date 07/10/20               OT Long Term Goals - 08/04/20 0709       OT LONG TERM GOAL #1   Title Patient will complete and updated HEP to address RUE ROM and functional strength    Time 12    Period Weeks    Status On-going   continue to add PRN     OT LONG TERM GOAL #2   Title Patient will utilize BUE to weave chair seating (caning) with increased time and intermittent support    Time 12    Period Weeks    Status Achieved   has not tried yet - will continue to work towards coordination with [place] #3   Title Patient will write a full sentence with RUE  legibly    Time 8    Period Weeks    Status Achieved   pt reports it is better and wrote a sentence in clinic with 100% legibility     OT LONG TERM GOAL #4   Title Patient will reach into overhead cabinet to obtain a lightweight object (less than 3 lb) with RUE    Time 12    Period Weeks    Status On-going   pt with increased pain in RUE shoulder     OT LONG TERM GOAL #5   Title Patient will utilize mallet with RUE to tap to open joints of chair in furniture restoration project    Time 12    Period Weeks    Status Achieved   still working towards increasing strength for using mallet     OT LONG TERM GOAL #6   Title Patient will demonstrate understanding of recommendations relating to retunring to driving    Time 12    Period Weeks    Status Achieved                   Plan - 08/04/20 0707     Clinical Impression Statement Pt has shown steady improvement with Right shoulder and elbow range of motion, and also functional strength and use.  Will discontinue pool and move into all clinic appointments going forward.    OT Occupational Profile and History Detailed Assessment- Review of Records and additional review of physical, cognitive, psychosocial history related to current functional performance    Occupational performance deficits (Please refer to evaluation for details): ADL's;IADL's;Work    Body Structure / Function / Physical Skills ADL;Coordination;Endurance;GMC;UE functional use;Balance;Decreased knowledge of precautions;Fascial restriction;Pain;IADL;Flexibility;Decreased knowledge of use of DME;Body mechanics;Dexterity;FMC;Strength;Tone;ROM    Rehab Potential Excellent    Clinical Decision Making Several treatment options, min-mod task modification necessary    Comorbidities Affecting Occupational Performance: May have comorbidities impacting occupational performance    Modification or Assistance to  Complete Evaluation  Min-Moderate modification of tasks or assist with  assess necessary to complete eval    OT Frequency 2x / week    OT Duration 8 weeks    OT Treatment/Interventions Self-care/ADL training;Electrical Stimulation;Therapeutic exercise;Aquatic Therapy;Moist Heat;Neuromuscular education;Compression bandaging;Splinting;Patient/family education;Balance training;Therapeutic activities;Functional Mobility Training;Fluidtherapy;Cryotherapy;Ultrasound;Contrast Bath;DME and/or AE instruction;Manual Therapy;Passive range of motion    Plan Shoulder range of motion- NMR Proximal strengthening, mechanics for reaching, postural set. Arm moving in isolation of body, hand coord/strengthening    OT Home Exercise Plan coordination HEP, putty HEP, standing rocking for shoulder ranges    Consulted and Agree with Plan of Care Patient             Patient will benefit from skilled therapeutic intervention in order to improve the following deficits and impairments:   Body Structure / Function / Physical Skills: ADL, Coordination, Endurance, GMC, UE functional use, Balance, Decreased knowledge of precautions, Fascial restriction, Pain, IADL, Flexibility, Decreased knowledge of use of DME, Body mechanics, Dexterity, FMC, Strength, Tone, ROM       Visit Diagnosis: Hemiplegia and hemiparesis following cerebral infarction affecting right dominant side (HCC)  Abnormal posture  Muscle weakness (generalized)  Unsteadiness on feet  Pain in right wrist  Localized edema    Problem List Patient Active Problem List   Diagnosis Date Noted   Pain in joint of right shoulder 06/24/2020   Spasticity as late effect of cerebrovascular accident (CVA) 04/22/2020   Elevated BUN    Sleep disturbance    Infarction of left basal ganglia (HCC) 02/21/2020   Right hemiparesis (HCC)    Leukocytosis    Sinus tachycardia    Dyslipidemia    History of gout    Left sided lacunar infarction ([facility]) 02/16/2020   Chronic hepatitis C without hepatic coma ([facility]) 01/20/2016   Lumbar  disc disease with radiculopathy 03/07/2012   Essential hypertension 12/14/2009   RHINITIS 03/26/2008   Gout 01/15/2007   ERECTILE DYSFUNCTION 01/15/2007   CARPAL TUNNEL SYNDROME, RIGHT 01/15/2007    Mariah Milling 08/04/2020, 7:11 AM  [facility] [address] [place], [place], [address] Phone: [phone]   Fax:  [phone]  Name: Arlington Sigmund Sr. MRN: [id number] Date of Birth: [birth year]

## 2020-08-05 ENCOUNTER — Ambulatory Visit: Payer: 59

## 2020-08-05 ENCOUNTER — Encounter: Payer: Self-pay | Admitting: Occupational Therapy

## 2020-08-05 ENCOUNTER — Other Ambulatory Visit: Payer: Self-pay

## 2020-08-05 ENCOUNTER — Ambulatory Visit: Payer: 59 | Admitting: Occupational Therapy

## 2020-08-05 DIAGNOSIS — R600 Localized edema: Secondary | ICD-10-CM | POA: Diagnosis not present

## 2020-08-05 DIAGNOSIS — M25531 Pain in right wrist: Secondary | ICD-10-CM

## 2020-08-05 DIAGNOSIS — R2681 Unsteadiness on feet: Secondary | ICD-10-CM | POA: Diagnosis not present

## 2020-08-05 DIAGNOSIS — I69351 Hemiplegia and hemiparesis following cerebral infarction affecting right dominant side: Secondary | ICD-10-CM

## 2020-08-05 DIAGNOSIS — R293 Abnormal posture: Secondary | ICD-10-CM

## 2020-08-05 DIAGNOSIS — M6281 Muscle weakness (generalized): Secondary | ICD-10-CM | POA: Diagnosis not present

## 2020-08-05 NOTE — Therapy (Signed)
[facility] [address] [place], [place], [address] Phone: [phone]   Fax:  [phone]  Occupational Therapy Treatment and Progress Update  Patient Details  Name: Brent Traynham Sr. MRN: [id number] Date of Birth: 05/30/1950 Referring Provider (OT): Marlowe Shores   Encounter Date: 08/05/2020   OT End of Session - 08/05/20 1752     Visit Number 30    Number of Visits 37    Date for OT Re-Evaluation 08/24/20    Authorization Type [facility]    Authorization Time Period week 7 of 8 (7/18)    Progress Note Due on Visit 30    OT Start Time 1530    OT Stop Time 1615    OT Time Calculation (min) 45 min    Activity Tolerance Patient tolerated treatment well    Behavior During Therapy WFL for tasks assessed/performed             Past Medical History:  Diagnosis Date   Carpal tunnel syndrome    Gout    Hyperlipidemia    on meds   Hypertension    on meds    Past Surgical History:  Procedure Laterality Date   COLONOSCOPY  2015   DJ-MAC-polyps   LOOP RECORDER INSERTION N/A 02/19/2020   Procedure: LOOP RECORDER INSERTION;  Surgeon: Constance Haw, MD;  Location: [facility];  Service: Cardiovascular;  Laterality: N/A;   no prior surgery     POLYPECTOMY  2015   DJ-MAC-polyps    There were no vitals filed for this visit.   Subjective Assessment - 08/05/20 1749     Subjective  When asked regarding specific challenges to use of right hand - patient indicated strength - e.g. use of aerosol can for spray finishing wood project    Currently in Pain? 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  Index finger strengthening for use with spray finish, hammer for wrist strengthening as well as timing / precision, and elbow strengthening.   Also used hammer to address strength of supination / pronation.  Worked on fine motior coordination with small tools tweezers, and pegs requiring in hand manipulation                    OT Education - 08/05/20 1752     Education Details use spray paint as practice and strengthening    Person(s) Educated Patient    Methods Explanation    Comprehension Verbalized understanding              OT Short Term Goals - 08/05/20 1756       OT SHORT TERM GOAL #1   Title Patient will complete a home exercise program designed to improve RUE range of motion    Period Weeks    Status Achieved    Target Date 05/02/20      OT SHORT TERM GOAL #2   Title Patient will utilize RUE to grasp release feeding utensil with min assist    Time 4    Period Weeks    Status Achieved      OT SHORT TERM GOAL #3   Title Patient will tie shoes bimanually  with increased time and min assist    Time 4    Period Weeks    Status Achieved      OT SHORT TERM GOAL #4   Title Patient will zipper light weight jacket, or button 2 buttons on shirt with increased time and min assist    Time 4    Period Weeks    Status Achieved      OT SHORT TERM GOAL #5   Title Patient will utilize BUE to wash a lightweight dish while standing at sink    Time 4    Period Weeks    Status Achieved      OT SHORT TERM GOAL #6   Title Patient will demonstrate mid level reach in modified closed to open chain to obtain a 1lb or less weighted object with RUE    Time 4    Period Weeks    Status Achieved    Target Date 07/10/20               OT Long Term Goals - 08/05/20 1756       OT LONG TERM GOAL #1   Title Patient will complete and updated HEP to address RUE ROM and functional strength    Time 12    Period Weeks    Status On-going   continue to add PRN     OT LONG TERM GOAL #2   Title Patient will utilize BUE to weave chair seating (caning) with increased time and intermittent support    Time 12     Period Weeks    Status Achieved   has not tried yet - will continue to work towards coordination with [place] #3   Title Patient will write a full sentence with RUE legibly    Time 8    Period Weeks    Status Achieved   pt reports it is better and wrote a sentence in clinic with 100% legibility     OT LONG TERM GOAL #4   Title Patient will reach into overhead cabinet to obtain a lightweight object (less than 3 lb) with RUE    Time 12    Period Weeks    Status Achieved   pt with increased pain in RUE shoulder     OT LONG TERM GOAL #5   Title Patient will utilize mallet with RUE to tap to open joints of chair in furniture restoration project    Time 12    Period Weeks    Status Achieved   still working towards increasing strength for using mallet     OT LONG TERM GOAL #6   Title Patient will demonstrate understanding of recommendations relating to retunring to driving    Time 12    Period Weeks    Status Achieved                   Plan - 08/05/20 1754     Clinical Impression Statement This progress report covers dates of service from 5/25-7/20.  Patient continues to progress toward achievement of long term goals, has only occasional pain in right arm, and is steadily increasing functional use.    OT Occupational Profile and History Detailed Assessment- Review of Records and additional review of physical, cognitive, psychosocial history related to current functional performance    Occupational performance deficits (Please refer to evaluation for details): ADL's;IADL's;Work    Body Structure / Function / Physical Skills ADL;Coordination;Endurance;GMC;UE  functional use;Balance;Decreased knowledge of precautions;Fascial restriction;Pain;IADL;Flexibility;Decreased knowledge of use of DME;Body mechanics;Dexterity;FMC;Strength;Tone;ROM    Rehab Potential Excellent    Clinical Decision Making Several treatment options, min-mod task modification necessary     Comorbidities Affecting Occupational Performance: May have comorbidities impacting occupational performance    Modification or Assistance to Complete Evaluation  Min-Moderate modification of tasks or assist with assess necessary to complete eval    OT Frequency 2x / week    OT Duration 8 weeks    OT Treatment/Interventions Self-care/ADL training;Electrical Stimulation;Therapeutic exercise;Aquatic Therapy;Moist Heat;Neuromuscular education;Compression bandaging;Splinting;Patient/family education;Balance training;Therapeutic activities;Functional Mobility Training;Fluidtherapy;Cryotherapy;Ultrasound;Contrast Bath;DME and/or AE instruction;Manual Therapy;Passive range of motion    Plan functional use and strength proximally and distally    OT Home Exercise Plan coordination HEP, putty HEP, standing rocking for shoulder ranges    Consulted and Agree with Plan of Care Patient             Patient will benefit from skilled therapeutic intervention in order to improve the following deficits and impairments:   Body Structure / Function / Physical Skills: ADL, Coordination, Endurance, GMC, UE functional use, Balance, Decreased knowledge of precautions, Fascial restriction, Pain, IADL, Flexibility, Decreased knowledge of use of DME, Body mechanics, Dexterity, FMC, Strength, Tone, ROM       Visit Diagnosis: Hemiplegia and hemiparesis following cerebral infarction affecting right dominant side (HCC)  Abnormal posture  Muscle weakness (generalized)  Unsteadiness on feet  Pain in right wrist  Localized edema    Problem List Patient Active Problem List   Diagnosis Date Noted   Pain in joint of right shoulder 06/24/2020   Spasticity as late effect of cerebrovascular accident (CVA) 04/22/2020   Elevated BUN    Sleep disturbance    Infarction of left basal ganglia (HCC) 02/21/2020   Right hemiparesis (HCC)    Leukocytosis    Sinus tachycardia    Dyslipidemia    History of gout    Left  sided lacunar infarction ([facility]) 02/16/2020   Chronic hepatitis C without hepatic coma ([place] [facility]) 01/20/2016   Lumbar disc disease with radiculopathy 03/07/2012   Essential hypertension 12/14/2009   RHINITIS 03/26/2008   Gout 01/15/2007   ERECTILE DYSFUNCTION 01/15/2007   CARPAL TUNNEL SYNDROME, RIGHT 01/15/2007    Mariah Milling 08/05/2020, 5:57 PM  [facility] [address] [place], [place], [address] Phone: [phone]   Fax:  [phone]  Name: Brent Levenhagen Sr. MRN: [id number] Date of Birth: May 08, 1950

## 2020-08-06 ENCOUNTER — Other Ambulatory Visit (HOSPITAL_COMMUNITY): Payer: Self-pay

## 2020-08-07 ENCOUNTER — Other Ambulatory Visit (HOSPITAL_COMMUNITY): Payer: Self-pay

## 2020-08-10 ENCOUNTER — Ambulatory Visit: Payer: 59 | Admitting: Occupational Therapy

## 2020-08-10 ENCOUNTER — Ambulatory Visit (INDEPENDENT_AMBULATORY_CARE_PROVIDER_SITE_OTHER): Payer: 59

## 2020-08-10 DIAGNOSIS — I639 Cerebral infarction, unspecified: Secondary | ICD-10-CM | POA: Diagnosis not present

## 2020-08-11 LAB — CUP PACEART REMOTE DEVICE CHECK
Date Time Interrogation Session: 20220723091016
Implantable Pulse Generator Implant Date: 20220202

## 2020-08-12 ENCOUNTER — Ambulatory Visit: Payer: 59 | Admitting: Occupational Therapy

## 2020-08-12 ENCOUNTER — Ambulatory Visit: Payer: 59

## 2020-08-17 ENCOUNTER — Ambulatory Visit: Payer: 59 | Admitting: Occupational Therapy

## 2020-08-19 ENCOUNTER — Other Ambulatory Visit: Payer: Self-pay

## 2020-08-19 ENCOUNTER — Encounter: Payer: Self-pay | Admitting: Occupational Therapy

## 2020-08-19 ENCOUNTER — Ambulatory Visit: Payer: 59 | Attending: Physician Assistant | Admitting: Occupational Therapy

## 2020-08-19 DIAGNOSIS — M25531 Pain in right wrist: Secondary | ICD-10-CM | POA: Insufficient documentation

## 2020-08-19 DIAGNOSIS — R600 Localized edema: Secondary | ICD-10-CM | POA: Insufficient documentation

## 2020-08-19 DIAGNOSIS — M6281 Muscle weakness (generalized): Secondary | ICD-10-CM | POA: Diagnosis not present

## 2020-08-19 DIAGNOSIS — R293 Abnormal posture: Secondary | ICD-10-CM | POA: Diagnosis not present

## 2020-08-19 DIAGNOSIS — I69351 Hemiplegia and hemiparesis following cerebral infarction affecting right dominant side: Secondary | ICD-10-CM | POA: Insufficient documentation

## 2020-08-19 DIAGNOSIS — R2681 Unsteadiness on feet: Secondary | ICD-10-CM | POA: Insufficient documentation

## 2020-08-19 NOTE — Patient Instructions (Signed)
Lying on your back - holding a 5 lb dumbbell with both hands - complete each exercise 10 times and repeat at least twice  1)  Press weight  up from chest toward ceiling - then reach up over head  Concentrate on feet pointing to opposite wall then arms.   Concentrate on elbows straight.  2)  Chest press  3)  Press bar up to ceiling then just a bit farther by moving your shoulder blades away from each other.   4)  Press weight up toward ceiling, then keep large arm bone straight, bending elbows to bring weight down toward your head   5)  With red band wrap around hands, keep elbows tucked into your side and slowly move arms away from each other.    Seated: 5)  Bicep curl

## 2020-08-19 NOTE — Therapy (Signed)
[facility] [address] [place], [place], [address] Phone: [phone]   Fax:  [phone]  Occupational Therapy Treatment  Patient Details  Name: Brent Gurry Sr. MRN: [id number] Date of Birth: [birth year] Referring Provider (OT): Marlowe Shores   Encounter Date: 08/19/2020   OT End of Session - 08/19/20 1742     Visit Number 31    Number of Visits 37    Date for OT Re-Evaluation 08/24/20    Authorization Type [facility] [facility]    Authorization Time Period week 8 of 8 need recert next visit    Progress Note Due on Visit 40    OT Start Time 1400    OT Stop Time 1445    OT Time Calculation (min) 45 min    Activity Tolerance Patient tolerated treatment well    Behavior During Therapy WFL for tasks assessed/performed             Past Medical History:  Diagnosis Date   Carpal tunnel syndrome    Gout    Hyperlipidemia    on meds   Hypertension    on meds    Past Surgical History:  Procedure Laterality Date   COLONOSCOPY  2015   DJ-MAC-polyps   LOOP RECORDER INSERTION N/A 02/19/2020   Procedure: LOOP RECORDER INSERTION;  Surgeon: Constance Haw, MD;  Location: [facility];  Service: Cardiovascular;  Laterality: N/A;   no prior surgery     POLYPECTOMY  2015   DJ-MAC-polyps    There were no vitals filed for this visit.   Subjective Assessment - 08/19/20 1406     Subjective  Right arm stiff and a little sore    Patient is accompanied by: Family member    Currently in Pain? No/denies    Pain Score 0-No pain                          OT Treatments/Exercises (OP) - 08/19/20 0001       Neurological Re-education Exercises   Other Exercises 1 Neuromuscular reeducation to address shoulder and elbow end range of motion.  Able to add resistance training with 5lb weight this visit - see patient instructions                    OT Education - 08/19/20 1741     Education  Details HEP with resistance    Person(s) Educated Patient    Methods Explanation;Demonstration;Tactile cues;Verbal cues;Handout    Comprehension Verbalized understanding;Returned demonstration              OT Short Term Goals - 08/19/20 1743       OT SHORT TERM GOAL #1   Title Patient will complete a home exercise program designed to improve RUE range of motion    Period Weeks    Status Achieved    Target Date 05/02/20      OT SHORT TERM GOAL #2   Title Patient will utilize RUE to grasp release feeding utensil with min assist    Time 4    Period Weeks    Status Achieved      OT SHORT TERM GOAL #3   Title Patient will tie shoes bimanually with increased time and min assist    Time 4    Period Weeks    Status Achieved      OT SHORT TERM GOAL #4   Title Patient will zipper light weight  jacket, or button 2 buttons on shirt with increased time and min assist    Time 4    Period Weeks    Status Achieved      OT SHORT TERM GOAL #5   Title Patient will utilize BUE to wash a lightweight dish while standing at sink    Time 4    Period Weeks    Status Achieved      OT SHORT TERM GOAL #6   Title Patient will demonstrate mid level reach in modified closed to open chain to obtain a 1lb or less weighted object with RUE    Time 4    Period Weeks    Status Achieved    Target Date 07/10/20               OT Long Term Goals - 08/19/20 1743       OT LONG TERM GOAL #1   Title Patient will complete and updated HEP to address RUE ROM and functional strength    Time 12    Period Weeks    Status On-going   continue to add PRN     OT LONG TERM GOAL #2   Title Patient will utilize BUE to weave chair seating (caning) with increased time and intermittent support    Time 12    Period Weeks    Status Achieved   has not tried yet - will continue to work towards coordination with [place] #3   Title Patient will write a full sentence with RUE legibly    Time  8    Period Weeks    Status Achieved   pt reports it is better and wrote a sentence in clinic with 100% legibility     OT LONG TERM GOAL #4   Title Patient will reach into overhead cabinet to obtain a lightweight object (less than 3 lb) with RUE    Time 12    Period Weeks    Status Achieved   pt with increased pain in RUE shoulder     OT LONG TERM GOAL #5   Title Patient will utilize mallet with RUE to tap to open joints of chair in furniture restoration project    Time 12    Period Weeks    Status Achieved   still working towards increasing strength for using mallet     OT LONG TERM GOAL #6   Title Patient will demonstrate understanding of recommendations relating to retunring to driving    Time 12    Period Weeks    Status Achieved                   Plan - 08/19/20 1742     Clinical Impression Statement Patient continues to show improved end ranges of motion shoulder and elbow.  Plan discharge in next few visits.    OT Occupational Profile and History Detailed Assessment- Review of Records and additional review of physical, cognitive, psychosocial history related to current functional performance    Occupational performance deficits (Please refer to evaluation for details): ADL's;IADL's;Work    Body Structure / Function / Physical Skills ADL;Coordination;Endurance;GMC;UE functional use;Balance;Decreased knowledge of precautions;Fascial restriction;Pain;IADL;Flexibility;Decreased knowledge of use of DME;Body mechanics;Dexterity;FMC;Strength;Tone;ROM    Rehab Potential Excellent    Clinical Decision Making Several treatment options, min-mod task modification necessary    Comorbidities Affecting Occupational Performance: May have comorbidities impacting occupational performance    Modification or Assistance to Complete Evaluation  Min-Moderate modification of tasks or assist with assess necessary to complete eval    OT Frequency 2x / week    OT Duration 8 weeks    OT  Treatment/Interventions Self-care/ADL training;Electrical Stimulation;Therapeutic exercise;Aquatic Therapy;Moist Heat;Neuromuscular education;Compression bandaging;Splinting;Patient/family education;Balance training;Therapeutic activities;Functional Mobility Training;Fluidtherapy;Cryotherapy;Ultrasound;Contrast Bath;DME and/or AE instruction;Manual Therapy;Passive range of motion    Plan functional use and strength proximally and distally    OT Home Exercise Plan coordination HEP, putty HEP, standing rocking for shoulder ranges    Consulted and Agree with Plan of Care Patient             Patient will benefit from skilled therapeutic intervention in order to improve the following deficits and impairments:   Body Structure / Function / Physical Skills: ADL, Coordination, Endurance, GMC, UE functional use, Balance, Decreased knowledge of precautions, Fascial restriction, Pain, IADL, Flexibility, Decreased knowledge of use of DME, Body mechanics, Dexterity, FMC, Strength, Tone, ROM       Visit Diagnosis: Abnormal posture  Hemiplegia and hemiparesis following cerebral infarction affecting right dominant side (HCC)  Muscle weakness (generalized)  Pain in right wrist  Unsteadiness on feet    Problem List Patient Active Problem List   Diagnosis Date Noted   Pain in joint of right shoulder 06/24/2020   Spasticity as late effect of cerebrovascular accident (CVA) 04/22/2020   Elevated BUN    Sleep disturbance    Infarction of left basal ganglia (HCC) 02/21/2020   Right hemiparesis (HCC)    Leukocytosis    Sinus tachycardia    Dyslipidemia    History of gout    Left sided lacunar infarction ([facility]) 02/16/2020   Chronic hepatitis C without hepatic coma ([facility]) 01/20/2016   Lumbar disc disease with radiculopathy 03/07/2012   Essential hypertension 12/14/2009   RHINITIS 03/26/2008   Gout 01/15/2007   ERECTILE DYSFUNCTION 01/15/2007   CARPAL TUNNEL SYNDROME, RIGHT 01/15/2007     Mariah Milling 08/19/2020, 5:44 PM  [facility] [address] [place], [place], [address] Phone: [phone]   Fax:  [phone]  Name: Milad Bublitz Sr. MRN: [id number] Date of Birth: 1950/04/11

## 2020-08-21 ENCOUNTER — Other Ambulatory Visit (HOSPITAL_COMMUNITY): Payer: Self-pay

## 2020-08-24 ENCOUNTER — Ambulatory Visit: Payer: 59 | Admitting: Occupational Therapy

## 2020-08-26 ENCOUNTER — Other Ambulatory Visit: Payer: Self-pay

## 2020-08-26 ENCOUNTER — Encounter: Payer: Self-pay | Admitting: Occupational Therapy

## 2020-08-26 ENCOUNTER — Ambulatory Visit: Payer: 59 | Admitting: Occupational Therapy

## 2020-08-26 DIAGNOSIS — R2681 Unsteadiness on feet: Secondary | ICD-10-CM

## 2020-08-26 DIAGNOSIS — R293 Abnormal posture: Secondary | ICD-10-CM | POA: Diagnosis not present

## 2020-08-26 DIAGNOSIS — M25531 Pain in right wrist: Secondary | ICD-10-CM | POA: Diagnosis not present

## 2020-08-26 DIAGNOSIS — R600 Localized edema: Secondary | ICD-10-CM

## 2020-08-26 DIAGNOSIS — M6281 Muscle weakness (generalized): Secondary | ICD-10-CM | POA: Diagnosis not present

## 2020-08-26 DIAGNOSIS — I69351 Hemiplegia and hemiparesis following cerebral infarction affecting right dominant side: Secondary | ICD-10-CM

## 2020-08-26 NOTE — Therapy (Signed)
[facility] [address] [place], [place], [address] Phone: [phone]   Fax:  [phone]  Occupational Therapy Treatment and Discharge Summary  Patient Details  Name: Brent Crisco Sr. MRN: [id number] Date of Birth: March 27, 1950 Referring Provider (OT): Marlowe Shores   Encounter Date: 08/26/2020   OT End of Session - 08/26/20 1428     Visit Number 32    Number of Visits 37    Date for OT Re-Evaluation 08/24/20    Authorization Type [facility]    Authorization Time Period week 8 of 8 need recert next visit    Progress Note Due on Visit 40    OT Start Time 1400    OT Stop Time 1420    OT Time Calculation (min) 20 min    Activity Tolerance Patient tolerated treatment well    Behavior During Therapy WFL for tasks assessed/performed             Past Medical History:  Diagnosis Date   Carpal tunnel syndrome    Gout    Hyperlipidemia    on meds   Hypertension    on meds    Past Surgical History:  Procedure Laterality Date   COLONOSCOPY  2015   DJ-MAC-polyps   LOOP RECORDER INSERTION N/A 02/19/2020   Procedure: LOOP RECORDER INSERTION;  Surgeon: Constance Haw, MD;  Location: [facility];  Service: Cardiovascular;  Laterality: N/A;   no prior surgery     POLYPECTOMY  2015   DJ-MAC-polyps    There were no vitals filed for this visit.   Subjective Assessment - 08/26/20 1406     Subjective  I have been working a whole lot.  Arm a little achy at times.  My hand is getting stronger    Currently in Pain? No/denies    Pain Score 0-No pain                          OT Treatments/Exercises (OP) - 08/26/20 0001       ADLs   ADL Comments Reviewed remaining goals and home exercise and activity programs.  Patient has purchased 2,5, and 8 lb weights to work on [employer].  Discussed parameters for increasing strength - monitor pain, stop any movement that causes pain, increase  repetition before weight, and continue to use arm functionally as much as possible.  Patient with very mild occasional shoulder ache - so offered him the option to set up follow up session in 6-12 weeks if pain persists or worsens.  Patient sees physiatrist [facility] Sept.                      OT Short Term Goals - 08/26/20 1407       OT SHORT TERM GOAL #1   Title Patient will complete a home exercise program designed to improve RUE range of motion    Time 4    Status Achieved    Target Date 05/02/20      OT SHORT TERM GOAL #2   Title Patient will utilize RUE to grasp release feeding utensil with min assist    Time 4    Period Weeks      OT SHORT TERM GOAL #3   Title Patient will tie shoes bimanually with increased time and min assist    Time 4    Period Weeks    Status Achieved  OT SHORT TERM GOAL #4   Title Patient will zipper light weight jacket, or button 2 buttons on shirt with increased time and min assist    Time 4    Period Weeks    Status Achieved      OT SHORT TERM GOAL #5   Title Patient will utilize BUE to wash a lightweight dish while standing at sink    Time 4    Period Weeks    Status Achieved      OT SHORT TERM GOAL #6   Title Patient will demonstrate mid level reach in modified closed to open chain to obtain a 1lb or less weighted object with RUE    Time 4    Period Weeks    Status Achieved    Target Date 07/10/20               OT Long Term Goals - 08/26/20 1408       OT LONG TERM GOAL #1   Title Patient will complete and updated HEP to address RUE ROM and functional strength    Time 12    Period Weeks    Status Achieved   continue to add PRN     OT LONG TERM GOAL #2   Title Patient will utilize BUE to weave chair seating (caning) with increased time and intermittent support    Time 12    Period Weeks    Status Achieved   has not tried yet - will continue to work towards coordination with [place] #3    Title Patient will write a full sentence with RUE legibly    Time 8    Period Weeks    Status Achieved   pt reports it is better and wrote a sentence in clinic with 100% legibility     OT LONG TERM GOAL #4   Title Patient will reach into overhead cabinet to obtain a lightweight object (less than 3 lb) with RUE    Time 12    Period Weeks    Status Achieved   pt with increased pain in RUE shoulder     OT LONG TERM GOAL #5   Title Patient will utilize mallet with RUE to tap to open joints of chair in furniture restoration project    Time 12    Period Weeks    Status Achieved   still working towards increasing strength for using mallet     OT LONG TERM GOAL #6   Title Patient will demonstrate understanding of recommendations relating to retunring to driving    Time 12    Period Weeks    Status Achieved                   Plan - 08/26/20 1429     Clinical Impression Statement Patient agreeable to OT discharge as this is last scheduled OT visit.    OT Occupational Profile and History Detailed Assessment- Review of Records and additional review of physical, cognitive, psychosocial history related to current functional performance    Occupational performance deficits (Please refer to evaluation for details): ADL's;IADL's;Work    Body Structure / Function / Physical Skills ADL;Coordination;Endurance;GMC;UE functional use;Balance;Decreased knowledge of precautions;Fascial restriction;Pain;IADL;Flexibility;Decreased knowledge of use of DME;Body mechanics;Dexterity;FMC;Strength;Tone;ROM    Rehab Potential Excellent    Clinical Decision Making Several treatment options, min-mod task modification necessary    Comorbidities Affecting Occupational Performance: May have comorbidities impacting occupational performance  Modification or Assistance to Complete Evaluation  Min-Moderate modification of tasks or assist with assess necessary to complete eval    OT Frequency 2x / week    OT  Duration 8 weeks    OT Treatment/Interventions Self-care/ADL training;Electrical Stimulation;Therapeutic exercise;Aquatic Therapy;Moist Heat;Neuromuscular education;Compression bandaging;Splinting;Patient/family education;Balance training;Therapeutic activities;Functional Mobility Training;Fluidtherapy;Cryotherapy;Ultrasound;Contrast Bath;DME and/or AE instruction;Manual Therapy;Passive range of motion    Plan discharge    OT Home Exercise Plan coordination HEP, putty HEP, standing rocking for shoulder ranges    Consulted and Agree with Plan of Care Patient             Patient will benefit from skilled therapeutic intervention in order to improve the following deficits and impairments:   Body Structure / Function / Physical Skills: ADL, Coordination, Endurance, GMC, UE functional use, Balance, Decreased knowledge of precautions, Fascial restriction, Pain, IADL, Flexibility, Decreased knowledge of use of DME, Body mechanics, Dexterity, FMC, Strength, Tone, ROM       Visit Diagnosis: Hemiplegia and hemiparesis following cerebral infarction affecting right dominant side (HCC)  Pain in right wrist  Unsteadiness on feet  Localized edema  Muscle weakness (generalized)  Abnormal posture    Problem List Patient Active Problem List   Diagnosis Date Noted   Pain in joint of right shoulder 06/24/2020   Spasticity as late effect of cerebrovascular accident (CVA) 04/22/2020   Elevated BUN    Sleep disturbance    Infarction of left basal ganglia (HCC) 02/21/2020   Right hemiparesis (HCC)    Leukocytosis    Sinus tachycardia    Dyslipidemia    History of gout    Left sided lacunar infarction ([facility]) 02/16/2020   Chronic hepatitis C without hepatic coma ([facility]) 01/20/2016   Lumbar disc disease with radiculopathy 03/07/2012   Essential hypertension 12/14/2009   RHINITIS 03/26/2008   Gout 01/15/2007   ERECTILE DYSFUNCTION 01/15/2007   CARPAL TUNNEL SYNDROME, RIGHT 01/15/2007   OCCUPATIONAL THERAPY DISCHARGE SUMMARY  Visits from Start of Care: 32  Current functional level related to goals / functional outcomes: Independent with ADL/IADL   Remaining deficits: Mild right UE weakness   Education / Equipment: HEP / Home activity Program   Patient agrees to discharge. Patient goals were met. Patient is being discharged due to meeting the stated rehab goals.Mariah Milling, OTR/L 08/26/2020, 2:31 PM  [facility] [address] [place], [place], [address] Phone: [phone]   Fax:  [phone]  Name: Brent Degroote Sr. MRN: [id number] Date of Birth: 1950/11/09

## 2020-09-02 NOTE — Progress Notes (Signed)
[facility] Summary Report / Loop Recorder 

## 2020-09-14 ENCOUNTER — Ambulatory Visit (INDEPENDENT_AMBULATORY_CARE_PROVIDER_SITE_OTHER): Payer: 59

## 2020-09-14 DIAGNOSIS — I639 Cerebral infarction, unspecified: Secondary | ICD-10-CM | POA: Diagnosis not present

## 2020-09-14 LAB — CUP PACEART REMOTE DEVICE CHECK
Date Time Interrogation Session: 20220825091052
Implantable Pulse Generator Implant Date: 20220202

## 2020-09-25 ENCOUNTER — Other Ambulatory Visit (HOSPITAL_COMMUNITY): Payer: Self-pay

## 2020-09-25 ENCOUNTER — Encounter: Payer: 59 | Admitting: Physical Medicine and Rehabilitation

## 2020-09-25 ENCOUNTER — Other Ambulatory Visit: Payer: Self-pay | Admitting: Adult Health

## 2020-09-25 MED ORDER — ROSUVASTATIN CALCIUM 20 MG PO TABS
ORAL_TABLET | Freq: Every day | ORAL | 0 refills | Status: DC
  Filled 2020-09-25: qty 90, 90d supply, fill #0

## 2020-09-25 NOTE — Progress Notes (Signed)
[facility] Summary Report / Loop Recorder 

## 2020-09-28 ENCOUNTER — Encounter: Payer: 59 | Attending: Registered Nurse | Admitting: Physical Medicine and Rehabilitation

## 2020-09-28 ENCOUNTER — Encounter: Payer: Self-pay | Admitting: Physical Medicine and Rehabilitation

## 2020-09-28 ENCOUNTER — Other Ambulatory Visit: Payer: Self-pay

## 2020-09-28 VITALS — BP 136/85 | HR 66 | Temp 98.3°F | Ht 70.0 in | Wt 169.0 lb

## 2020-09-28 DIAGNOSIS — G8191 Hemiplegia, unspecified affecting right dominant side: Secondary | ICD-10-CM | POA: Insufficient documentation

## 2020-09-28 DIAGNOSIS — M25511 Pain in right shoulder: Secondary | ICD-10-CM | POA: Insufficient documentation

## 2020-09-28 DIAGNOSIS — I69398 Other sequelae of cerebral infarction: Secondary | ICD-10-CM | POA: Insufficient documentation

## 2020-09-28 DIAGNOSIS — R252 Cramp and spasm: Secondary | ICD-10-CM | POA: Diagnosis not present

## 2020-09-28 NOTE — Progress Notes (Signed)
Pt is a 70 yr old R handed male with hx of gout, HTN, HLD with new L  Basal ganglia infarct with R hemiparesis, new spasticity as well as loop recorder placement-    Pt is here for R shoulder pain and R hemiparesis- wants steroid injection of R shoulder .  Pain in R shoulder came back ~ 2 weeks ago and still not as bad as was before injections. It's mainly in the lower R shoulder and middle/upper R arm.  OT was surprised how well the injection worked and was able to do so much more than was prior. Through with PT and OT- finished ~ 1 month ago.  Still doing HEP- ordered 2 and 5 lbs weights- waiting to use for this new shot.  ~ 3 x/week has been doing HEP.   Thinks he's getting stronger and stronger all the time and R hand esp getting stronger-   Back at work- was having difficulty using a hammer and now so much better. And finger strength is better as well.  And dexterity better.  And can do shoulder extension better and can reach above himself more- still painful, but better than it was.   Still taking Baclofen 5 mg TID- No many muscle spasms/muscle tightness lately.   Exam: Awake, alert, appropriate, no assistive device, NAD Pain in anterior shoulder with ROM of R shoulder MS: TTP over anterior/biceps tendon origin on R Also painful with resisted flexion of R biceps RUE- biceps 4+/5, triceps 4+/5, WE 4+/5, grip 5-/5 and FA as well as finger DIPs are 5-/5; Is able to do finger tip touches with thumb except 5th digit- more rapidly, but not quite baseline yet.  R Hoffman's (-) now and no increased tone on exam in RUE/RLE  A/P:  Pt is a 70 yr old R handed male with hx of gout, HTN, HLD with new L  Basal ganglia infarct with R hemiparesis, new spasticity as well as loop recorder placement-    Pt is here for R shoulder pain and R hemiparesis- wants steroid injection of R shoulder    Don't use more than 2 lb weights- has some therabands- I'm better with using theraband rather than  free weight-  Strongly suggest doing 15 minutes/day of some of the home exercise plan- change it up.  steroid injection was performed at bicipital tendons  on R and RTC on R using 1% plain Lidocaine and '40mg'$  /1cc of Kenalog for biceps tendon area and 1/2 cc of Kenalog and 1/2 cc Lidocaine for RTC injection since not hurting as much as last time. . This was well tolerated.  Cleaned with betadine x3 and allowed to dry- then alcohol then injected using 27 gauge 1.5 inch needle- no bleeding or complications.    F/U in 3 months for steroid injections of shoulders/biceps tendons.  Lidocaine will kick in 15 minutes- and wear off tonight- the steroid will kick in tomorrow within 24 hours and take up to 72 hours to fully kick in.  7. Make Baclofen as needed- so muscles can be tighter- or more fatigued in the R side-  con't 3x/day AS NEEDED.   8. F/U in 3 months for possible R shoulder injections- and f/u on spasticity/R hemiparesis.

## 2020-09-28 NOTE — Patient Instructions (Signed)
A/P:  Pt is a 70 yr old R handed male with hx of gout, HTN, HLD with new L  Basal ganglia infarct with R hemiparesis, new spasticity as well as loop recorder placement-    Pt is here for R shoulder pain and R hemiparesis- wants steroid injection of R shoulder    Don't use more than 2 lb weights- has some therabands- I'm better with using theraband rather than free weight-  Strongly suggest doing 15 minutes/day of some of the home exercise plan- change it up.  steroid injection was performed at bicipital tendons  on R and RTC on R using 1% plain Lidocaine and '40mg'$  /1cc of Kenalog for biceps tendon area and 1/2 cc of Kenalog and 1/2 cc Lidocaine for RTC injection since not hurting as much as last time. . This was well tolerated.  Cleaned with betadine x3 and allowed to dry- then alcohol then injected using 27 gauge 1.5 inch needle- no bleeding or complications.    F/U in 3 months for steroid injections of shoulders/biceps tendons.  Lidocaine will kick in 15 minutes- and wear off tonight- the steroid will kick in tomorrow within 24 hours and take up to 72 hours to fully kick in.  7. Make Baclofen as needed- so muscles can be tighter- or more fatigued in the R side-  con't 3x/day AS NEEDED.   8. F/U in 3 months for possible R shoulder injections- and f/u on spasticity/R hemiparesis.

## 2020-09-29 ENCOUNTER — Other Ambulatory Visit (HOSPITAL_COMMUNITY): Payer: Self-pay

## 2020-09-29 ENCOUNTER — Encounter: Payer: Self-pay | Admitting: Adult Health

## 2020-09-29 ENCOUNTER — Ambulatory Visit (INDEPENDENT_AMBULATORY_CARE_PROVIDER_SITE_OTHER): Payer: 59 | Admitting: Adult Health

## 2020-09-29 VITALS — BP 112/82 | HR 65 | Ht 70.0 in | Wt 170.2 lb

## 2020-09-29 DIAGNOSIS — I10 Essential (primary) hypertension: Secondary | ICD-10-CM

## 2020-09-29 DIAGNOSIS — I639 Cerebral infarction, unspecified: Secondary | ICD-10-CM

## 2020-09-29 DIAGNOSIS — E785 Hyperlipidemia, unspecified: Secondary | ICD-10-CM | POA: Diagnosis not present

## 2020-09-29 NOTE — Progress Notes (Signed)
I agree with the above plan 

## 2020-09-29 NOTE — Patient Instructions (Addendum)
Continue routine exercises at home for hopeful further recovery  Loop recorder has not shown atrial fibrillation thus far -continuous monitoring by cardiology  Continue clopidogrel 75 mg daily  and Crestor for secondary stroke prevention  We will check cholesterol levels today  Continue to follow up with PCP regarding cholesterol, blood pressure and diabetes management  Maintain strict control of hypertension with blood pressure goal below 130/90, diabetes with hemoglobin A1c goal below 7% and cholesterol with LDL cholesterol (bad cholesterol) goal below 70 mg/dL.       Followup in the future with me in 6 months or call earlier if needed       Thank you for coming to see [place] at [facility]. I hope we have been able to provide you high quality care today.  You may receive a patient satisfaction survey over the next few weeks. We would appreciate your feedback and comments so that we may continue to improve ourselves and the health of our patients.

## 2020-09-29 NOTE — Progress Notes (Signed)
[facility] [address] [place]. [place] [address] [phone]       STROKE FOLLOW UP NOTE  Mr. Brent Therien Sr. Date of Birth:  [birth year] Medical Record Number:  [id number]   Reason for Referral: stroke follow up    SUBJECTIVE:   CHIEF COMPLAINT:  Chief Complaint  Patient presents with   Follow-up    RM 3 here for 3 month f/u. Pt reports he has been doing well since his last visit. Reports he complete OT and PT over the last 1-2 month. Occasional dizzy episodes have been noted but not frequent.      HPI:   Today, 09/29/2020, Mr. Brent Mccormick returns for stroke follow-up after prior visit 6 months ago.  Overall doing well.  Completed therapies approximately 1 month ago.  Continues to do HEP for residual RUE weakness with spasticity continues to make progress daily.  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 Did receive steroid injections in right shoulder 3 months ago with improvement - received additional injection yesterday with Dr. Clair Gulling - reports some improvement already! He does report occasional dizziness with quick position change usually improves quickly but this morning had increased dizziness when his eyes were closed. He does report occasional episodes of dizziness since his stroke. Denies new stroke/TIA symptoms.  Remains on Plavix and Crestor.  Blood pressure today 112/82. Routinely monitors at home which has been stable since PCP adjusted BP meds. Loop recorder has not shown atrial fibrillation thus far.  No further concerns at this time.    History provided for reference purposes only Initial visit 04/01/2020 JM: Mr. Brent Mccormick is being seen for hospital follow-up accompanied by his wife He was discharged home from [facility] on 03/04/2020 after a 12 day stay  Reports residual mild right arm and leg weakness with greater weakness right hand and gait impairment with imbalance Single point cane now - prior use of RW -  denies any recent falls Currently working with neuro rehab PT/OT - reports making great improvement Previously working for himself doing Estate manager/land agent - he has not returned back to doing this yet Denies new or worsening stroke/TIA symptoms  Completed 3 weeks DAPT remains on Plavix -denies side effects Reports compliance on Crestor 20 mg daily -denies side effects Blood pressure today 140/78 - monitors at home 130-140s  Loop recorder has not shown atrial fibrillation thus far  No further concerns at this time   Stroke admission 02/16/2020 Mr. Brent Bignell Sr. is a 70 y.o. male with PMHx of hypertension, hyperlipidemia, diabetes, gout, former smoker quit in 1968 who presented to [facility] ED on 02/16/2020 with right arm and right leg weakness and right facial droop.  Personally reviewed hospitalization pertinent progress notes, lab work and imaging with summary provided.  Evaluated by Dr. Leonie Man with stroke work-up revealing acute ischemic infarct in the left basal ganglia s/p tPA, likely embolic given large size secondary to unknown source.  Recommended placement of loop recorder to evaluate for possible A. fib as etiology.  DAPT for 3 weeks and Plavix as on aspirin PTA.  LDL 121 rx'd Crestor PTA with noncompliance and initiated Crestor 20 mg daily.  Controlled DM with A1c 5.5.  Other stroke risk factors include HTN, former tobacco use, EtOH use, possible CHF and advanced age but no prior stroke history.   Stroke: Acute ischemic infarct in left basal ganglia, likely due cryptogenic given large size of the infarct code Stroke CT head No acute  abnormality.ASPECTS 10.  CTA head & neck :  Negative for intracranial large vessel occlusion MRI : Acute infarction in the left basal ganglia and radiating white matter tracts. 2D Echo : 60-65% There is severe left ventricular hypertrophy of the basal-septal segment. Left ventricular diastolic parameters are consistent with Grade I diastolic  dysfunction (impaired relaxation). Loop recorder placed 02/19/2020 LDL 121 HgbA1c 5.5 aspirin 81 mg daily prior to admission, For now due to post tPA. Recommend aspirin 81 mg and Plavix 75 mg for 3 weeks and then continue Plavix alone. Therapy recommendations: CIR Disposition: CIR on 02/21/2020      ROS:   14 system review of systems performed and negative with exception of those listed in HPI  PMH:  Past Medical History:  Diagnosis Date   Carpal tunnel syndrome    Gout    Hyperlipidemia    on meds   Hypertension    on meds    PSH:  Past Surgical History:  Procedure Laterality Date   COLONOSCOPY  2015   DJ-MAC-polyps   LOOP RECORDER INSERTION N/A 02/19/2020   Procedure: LOOP RECORDER INSERTION;  Surgeon: Constance Haw, MD;  Location: [place] [facility];  Service: Cardiovascular;  Laterality: N/A;   no prior surgery     POLYPECTOMY  2015   DJ-MAC-polyps    Social History:  Social History   Socioeconomic History   Marital status: Married    Spouse name: Not on file   Number of children: Not on file   Years of education: Not on file   Highest education level: Not on file  Occupational History   Not on file  Tobacco Use   Smoking status: Former    Types: Cigarettes    Quit date: 01/17/1966    Years since quitting: 54.7   Smokeless tobacco: Never  Vaping Use   Vaping Use: Never used  Substance and Sexual Activity   Alcohol use: Yes    Comment: occassionally   Drug use: No   Sexual activity: Yes  Other Topics Concern   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    Married    Two children, live locally.       Three dogs and two cats    Social Determinants of Radio broadcast assistant Strain: Not on file  Food Insecurity: Not on file  Transportation Needs: Not on file  Physical Activity: Not on file  Stress: Not on file  Social Connections: Not on file  Intimate  Partner Violence: Not on file    Family History:  Family History  Problem Relation Age of Onset   Ovarian cancer Mother    Heart disease Father    Hypertension Father    Colon polyps Sister 7   Colon cancer Neg Hx    Esophageal cancer Neg Hx    Rectal cancer Neg Hx    Stomach cancer Neg Hx     Medications:   Current Outpatient Medications on File Prior to Visit  Medication Sig Dispense Refill   acetaminophen (TYLENOL) 325 MG tablet Take 2 tablets (650 mg total) by mouth every 4 (four) hours as needed for mild pain (or temp > 37.5 C (99.5 F)).     amLODipine (NORVASC) 5 MG tablet Take 1 tablet (5 mg total) by mouth daily. 90 tablet 2   Baclofen 5 MG TABS Take 1 tablet by mouth 3 (three)  times daily for 2 weeks, then take 2 tablets  (10 mg) three times daily for spasticity (Patient taking differently: Take 5 mg by mouth as needed.) 180 tablet 5   cholecalciferol (VITAMIN D) 25 MCG (1000 UNIT) tablet TAKE 1 TABLET BY MOUTH DAILY 100 tablet 0   cholecalciferol (VITAMIN D3) 25 MCG (1000 UNIT) tablet Take 1 tablet (1,000 Units total) by mouth daily. 30 tablet 0   clopidogrel (PLAVIX) 75 MG tablet TAKE 1 TABLET BY MOUTH DAILY 90 tablet 3   colchicine 0.6 MG tablet TAKE 1 TABLET BY MOUTH 2 TIMES DAILY (Patient taking differently: Take by mouth daily.) 180 tablet 1   losartan (COZAAR) 100 MG tablet TAKE 1 TABLET BY MOUTH DAILY. 90 tablet 3   melatonin 3 MG TABS tablet TAKE 1 TABLET (3 MG TOTAL) BY MOUTH AT BEDTIME. 60 tablet 0   pantoprazole (PROTONIX) 40 MG tablet TAKE 1 TABLET BY MOUTH AT BEDTIME 90 tablet 1   rosuvastatin (CRESTOR) 20 MG tablet TAKE 1 TABLET BY MOUTH DAILY 90 tablet 0   No current facility-administered medications on file prior to visit.    Allergies:  No Known Allergies    OBJECTIVE:  Physical Exam  Vitals:   09/29/20 1029  BP: 112/82  Pulse: 65  SpO2: 98%  Weight: 170 lb 4 oz (77.2 kg)  Height: _0  (1.778 m)    Body mass index is 24.43 kg/m. No  results found.  General: well developed, well nourished,  very pleasant elderly Caucasian male, seated, in no evident distress Head: head normocephalic and atraumatic.   Neck: supple with no carotid or supraclavicular bruits Cardiovascular: regular rate and rhythm, no murmurs Musculoskeletal: no deformity Skin:  no rash/petichiae Vascular:  Normal pulses all extremities   Neurologic Exam Mental Status: Awake and fully alert.   Fluent speech and language.  Oriented to place and time. Recent and remote memory intact. Attention span, concentration and fund of knowledge appropriate. Mood and affect appropriate.  Cranial Nerves: Pupils equal, briskly reactive to light. Extraocular movements full without nystagmus. Visual fields full to confrontation. Hearing intact. Facial sensation intact. Face, tongue, palate moves normally and symmetrically.  Motor: Normal strength, bulk and tone left upper and lower extremity. RUE: 4/5 proximal and 4/5 hand grip with slightly increased tone RLE: 5/5 Sensory.: intact to touch , pinprick , position and vibratory sensation.  Coordination: Rapid alternating movements normal in all extremities except slightly decreased right hand. Finger-to-nose and heel-to-shin performed accurately bilaterally.  Orbits left arm over right arm. Gait and Station: Arises from chair without difficulty. Stance is normal. Gait demonstrates normal stride length and balance without use of assistive device.  Able to tandem walk and heel toe with mild difficulty. Reflexes: 1+ and symmetric. Toes downgoing.        ASSESSMENT: Brent Mccormick. is a 70 y.o. year old male presented with right arm and leg weakness and right facial droop on 02/16/2020 with stroke work-up revealing acute ischemic infarct in left basal ganglia s/p tPA likely embolic given large size secondary to unknown source s/p ILR. Vascular risk factors include HTN, HLD with statin noncompliance, former tobacco use, EtOH use  and possible CHF.      PLAN:  L BG stroke :  Residual deficit: Mild RUE weakness and occasional dizziness/imbalance but overall greatly improving.  Encouraged continued HEP for hopeful ongoing recovery. Loop recorder has not shown atrial fibrillation thus far.  Routinely monitored by cardiology Continue clopidogrel 75 mg daily  and Crestor  20 mg daily for secondary stroke prevention.   Discussed secondary stroke prevention measures and importance of close PCP follow up for aggressive stroke risk factor management  [facility] research team provided information regarding Remi Haggard trial HTN: BP goal <130/90.  Stable on losartan and amlodipine per PCP HLD: LDL goal <70.  Prior LDL 121 (01/2020).  Continue Crestor 20 mg daily.  Repeat lipid panel today as well as CMP     Follow up in 6 months or call earlier if needed   CC:  [place] provider: Dr. Leonie Man PCP: Dorothyann Peng, NP    I spent 36 minutes of face-to-face and non-face-to-face time with patient.  This included previsit chart review, lab review, study review, order entry, electronic health record documentation, patient education regarding prior stroke, ongoing monitoring of loop recorder, residual deficits and possible further recovery, secondary stroke prevention measures and importance of managing stroke risk factors and answered all other questions to patient satisfaction  Frann Rider, AGNP-BC  [facility] [address] [place], [place] [address]  Phone [phone] Fax [phone] Note: This document was prepared with digital dictation and possible smart phrase technology. Any transcriptional errors that result from this process are unintentional.

## 2020-09-30 LAB — COMPREHENSIVE METABOLIC PANEL
ALT: 25 IU/L (ref 0–44)
AST: 20 IU/L (ref 0–40)
Albumin/Globulin Ratio: 2.1 (ref 1.2–2.2)
Albumin: 5.2 g/dL — ABNORMAL HIGH (ref 3.7–4.7)
Alkaline Phosphatase: 55 IU/L (ref 44–121)
BUN/Creatinine Ratio: 22 (ref 10–24)
BUN: 21 mg/dL (ref 8–27)
Bilirubin Total: 0.4 mg/dL (ref 0.0–1.2)
CO2: 25 mmol/L (ref 20–29)
Calcium: 10 mg/dL (ref 8.6–10.2)
Chloride: 100 mmol/L (ref 96–106)
Creatinine, Ser: 0.97 mg/dL (ref 0.76–1.27)
Globulin, Total: 2.5 g/dL (ref 1.5–4.5)
Glucose: 101 mg/dL — ABNORMAL HIGH (ref 65–99)
Potassium: 4.6 mmol/L (ref 3.5–5.2)
Sodium: 140 mmol/L (ref 134–144)
Total Protein: 7.7 g/dL (ref 6.0–8.5)
eGFR: 83 mL/min/{1.73_m2} (ref >59)

## 2020-09-30 LAB — LIPID PANEL
Chol/HDL Ratio: 2.8 ratio (ref 0.0–5.0)
Cholesterol, Total: 150 mg/dL (ref 100–199)
HDL: 54 mg/dL (ref >39)
LDL Chol Calc (NIH): 76 mg/dL (ref 0–99)
Triglycerides: 110 mg/dL (ref 0–149)
VLDL Cholesterol Cal: 20 mg/dL (ref 5–40)

## 2020-10-19 ENCOUNTER — Ambulatory Visit (INDEPENDENT_AMBULATORY_CARE_PROVIDER_SITE_OTHER): Payer: 59

## 2020-10-19 DIAGNOSIS — I639 Cerebral infarction, unspecified: Secondary | ICD-10-CM

## 2020-10-19 LAB — CUP PACEART REMOTE DEVICE CHECK
Date Time Interrogation Session: 20220927091353
Implantable Pulse Generator Implant Date: 20220202

## 2020-10-23 ENCOUNTER — Other Ambulatory Visit (HOSPITAL_COMMUNITY): Payer: Self-pay

## 2020-10-26 NOTE — Progress Notes (Signed)
[facility] Summary Report / Loop Recorder 

## 2020-11-15 LAB — CUP PACEART REMOTE DEVICE CHECK
Date Time Interrogation Session: 20221030091107
Implantable Pulse Generator Implant Date: 20220202

## 2020-11-20 ENCOUNTER — Other Ambulatory Visit (HOSPITAL_COMMUNITY): Payer: Self-pay

## 2020-11-23 ENCOUNTER — Ambulatory Visit (INDEPENDENT_AMBULATORY_CARE_PROVIDER_SITE_OTHER): Payer: 59

## 2020-11-23 DIAGNOSIS — I639 Cerebral infarction, unspecified: Secondary | ICD-10-CM

## 2020-11-26 NOTE — Progress Notes (Signed)
[facility] Summary Report / Loop Recorder 

## 2020-12-23 LAB — CUP PACEART REMOTE DEVICE CHECK
Date Time Interrogation Session: 20221202090921
Implantable Pulse Generator Implant Date: 20220202

## 2020-12-25 ENCOUNTER — Other Ambulatory Visit: Payer: Self-pay | Admitting: Adult Health

## 2020-12-25 ENCOUNTER — Other Ambulatory Visit (HOSPITAL_COMMUNITY): Payer: Self-pay

## 2020-12-25 DIAGNOSIS — M109 Gout, unspecified: Secondary | ICD-10-CM

## 2020-12-28 ENCOUNTER — Other Ambulatory Visit (HOSPITAL_COMMUNITY): Payer: Self-pay

## 2020-12-28 ENCOUNTER — Encounter: Payer: 59 | Attending: Registered Nurse | Admitting: Physical Medicine and Rehabilitation

## 2020-12-28 ENCOUNTER — Other Ambulatory Visit: Payer: Self-pay

## 2020-12-28 ENCOUNTER — Ambulatory Visit (INDEPENDENT_AMBULATORY_CARE_PROVIDER_SITE_OTHER): Payer: 59

## 2020-12-28 ENCOUNTER — Encounter: Payer: Self-pay | Admitting: Physical Medicine and Rehabilitation

## 2020-12-28 VITALS — BP 157/93 | HR 76 | Temp 98.3°F | Ht 70.0 in | Wt 169.8 lb

## 2020-12-28 DIAGNOSIS — I6381 Other cerebral infarction due to occlusion or stenosis of small artery: Secondary | ICD-10-CM

## 2020-12-28 DIAGNOSIS — G8191 Hemiplegia, unspecified affecting right dominant side: Secondary | ICD-10-CM | POA: Diagnosis not present

## 2020-12-28 DIAGNOSIS — I639 Cerebral infarction, unspecified: Secondary | ICD-10-CM | POA: Diagnosis not present

## 2020-12-28 NOTE — Patient Instructions (Signed)
Pt is a 70 yr old R handed male with hx of gout, HTN, HLD with new L  Basal ganglia infarct with R hemiparesis, new spasticity as well as loop recorder placement-   F/U - is here for R hemiparesis and R shoulder pain/weakness   Off Baclofen- so doesn't need refills   2. C/o tinnitus- uses hearing protection- not a treatment for it- would only get referral to ENT if symptoms get worse.    3.  If gets pain in R shoulder, can always see for R shoulder injection in past.    4. BP 157/93- check BP a few times per week- keep a journal of it- to show [facility]- if needs ot increase BP meds.   5. F/U as needed

## 2020-12-28 NOTE — Progress Notes (Signed)
Subjective:    Patient ID: Brent Aloe Sr., male    DOB: 03-14-1950, 70 y.o.   MRN: [id number]  HPI  Pt is a 70 yr old R handed male with hx of gout, HTN, HLD with new L  Basal ganglia infarct with R hemiparesis, new spasticity as well as loop recorder placement-   F/U - is here for R hemiparesis and R shoulder pain/weakness   R shoulder is feeling weak, but not painful.  R shoulder injection in June 2022 was real helpful- made a big difference. Doesn't think has enough pain to warrant another shot.    Walking is good.  Done with therapy awhile back.   Limitations from stroke are much less.  Hand dexterity is MUCH better in R hand.  Weaves a chair seat- has done multiple times and takes a lot of dexterity to do that.  Driving.  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    Not taking Baclofen anymore for spasticity.  Hasn't had any muscle spasms lately- no issues.   Occ pain in back- lifted something wrong.   A slight buzzing in ears- last 3-4 weeks- constant- L>R ear.      Pain Inventory Average Pain 2 Pain Right Now 0 My pain is intermittent and dull  In the last 24 hours, has pain interfered with the following? General activity 0 Relation with others 0 Enjoyment of life 0 What TIME of day is your pain at its worst? varies Sleep (in general) Fair  Pain is worse with: standing Pain improves with: pacing activities Relief from Meds:  does not take medication for pain  Family History  Problem Relation Age of Onset   Ovarian cancer Mother    Heart disease Father    Hypertension Father    Colon polyps Sister 65   Colon cancer Neg Hx    Esophageal cancer Neg Hx    Rectal cancer Neg Hx    Stomach cancer Neg Hx    Social History   Socioeconomic History   Marital status: Married    Spouse name: Not on file   Number of children: Not on file   Years  of education: Not on file   Highest education level: Not on file  Occupational History   Not on file  Tobacco Use   Smoking status: Former    Types: Cigarettes    Quit date: 01/17/1966    Years since quitting: 54.9   Smokeless tobacco: Never  Vaping Use   Vaping Use: Never used  Substance and Sexual Activity   Alcohol use: Yes    Comment: occassionally   Drug use: No   Sexual activity: Yes  Other Topics Concern   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    Married    Two children, live locally.       Three dogs and two cats    Social Determinants of Health   Financial Resource Strain: Not on file  Food Insecurity: Not on file  Transportation Needs: Not on file  Physical Activity: Not on file  Stress: Not on file  Social Connections: Not on file   Past Surgical History:  Procedure Laterality Date   COLONOSCOPY  2015   DJ-MAC-polyps   LOOP RECORDER INSERTION N/A 02/19/2020  Procedure: LOOP RECORDER INSERTION;  Surgeon: Constance Haw, MD;  Location: [facility];  Service: Cardiovascular;  Laterality: N/A;   no prior surgery     POLYPECTOMY  2015   DJ-MAC-polyps   Past Surgical History:  Procedure Laterality Date   COLONOSCOPY  2015   DJ-MAC-polyps   LOOP RECORDER INSERTION N/A 02/19/2020   Procedure: LOOP RECORDER INSERTION;  Surgeon: Constance Haw, MD;  Location: [place] [facility];  Service: Cardiovascular;  Laterality: N/A;   no prior surgery     POLYPECTOMY  2015   DJ-MAC-polyps   Past Medical History:  Diagnosis Date   Carpal tunnel syndrome    Gout    Hyperlipidemia    on meds   Hypertension    on meds   Ht _0  (1.778 m)   Wt 169 lb 12.8 oz (77 kg)   BMI 24.36 kg/m   Opioid Risk Score:   Fall Risk Score:  `1  Depression screen PHQ 2/9  Depression screen [facility] 2/9 12/28/2020 09/28/2020 06/26/2020 04/22/2020 03/16/2020 06/11/2019 02/27/2018   Decreased Interest 0 0 0 0 0 0 0  Down, Depressed, Hopeless 0 0 0 0 0 0 0  PHQ - 2 Score 0 0 0 0 0 0 0  Altered sleeping - - - - 1 - -  Tired, decreased energy - - - - 0 - -  Change in appetite - - - - 0 - -  Feeling bad or failure about yourself  - - - - 0 - -  Trouble concentrating - - - - 0 - -  Moving slowly or fidgety/restless - - - - 0 - -  Suicidal thoughts - - - - 0 - -  PHQ-9 Score - - - - 1 - -     Review of Systems  Constitutional: Negative.   HENT: Negative.    Eyes: Negative.   Respiratory: Negative.    Cardiovascular: Negative.   Gastrointestinal: Negative.   Endocrine: Negative.   Genitourinary: Negative.   Musculoskeletal:  Positive for back pain.  Skin: Negative.   Allergic/Immunologic: Negative.   Neurological:  Positive for weakness.  Hematological: Negative.   Psychiatric/Behavioral: Negative.        Objective:   Physical Exam Awake, alert, no assistive device, NAD MS: 5-/5 in RUE- R deltoid, R bicep and R grip- otherwise 5/5 LUE- 5/5 in same muscles RLE_ HF 5-/5; otherwise KE?KF/DF and PF 5/5 LLE- 5/5 in same muscles  Finger dexterity much improved on R hand- just trace slower than L side.  Neuro: Intact to light touch in all 4 extremities No hoffman's or increased tone on RUE/RLE this AM       Assessment & Plan:   Pt is a 70 yr old R handed male with hx of gout, HTN, HLD with new L  Basal ganglia infarct with R hemiparesis, new spasticity as well as loop recorder placement-   F/U - is here for R hemiparesis and R shoulder pain/weakness   Off Baclofen- so doesn't need refills   2. C/o tinnitus- uses hearing protection- not a treatment for it- would only get referral to ENT if symptoms get worse.    3.  If gets pain in R shoulder, can always see for R shoulder injection in past.    4. BP 157/93- check BP a few times per week- keep a journal of it- to show [facility]- if needs ot increase BP meds.   5. F/U as needed  I spent a total of  21 minutes on visit- discussing BP and shoulder pain-

## 2020-12-29 ENCOUNTER — Other Ambulatory Visit (HOSPITAL_COMMUNITY): Payer: Self-pay

## 2020-12-29 ENCOUNTER — Other Ambulatory Visit: Payer: Self-pay | Admitting: Adult Health

## 2020-12-29 DIAGNOSIS — M109 Gout, unspecified: Secondary | ICD-10-CM

## 2020-12-29 MED ORDER — COLCHICINE 0.6 MG PO TABS
ORAL_TABLET | Freq: Two times a day (BID) | ORAL | 1 refills | Status: DC
  Filled 2020-12-29: qty 180, 90d supply, fill #0
  Filled 2021-06-25: qty 180, 90d supply, fill #1

## 2020-12-29 MED ORDER — ROSUVASTATIN CALCIUM 20 MG PO TABS
ORAL_TABLET | Freq: Every day | ORAL | 0 refills | Status: DC
  Filled 2020-12-29: qty 90, 90d supply, fill #0

## 2020-12-29 NOTE — Telephone Encounter (Signed)
Patient's wife called stating that the pharmacy has sent over refill requests for colchicine 0.6 MG tablet and rosuvastatin (CRESTOR) 20 MG tablet. Informed patient that another message could be sent back but that the refill request was already sent over.

## 2020-12-30 ENCOUNTER — Other Ambulatory Visit (HOSPITAL_COMMUNITY): Payer: Self-pay

## 2020-12-30 ENCOUNTER — Telehealth: Payer: Self-pay

## 2020-12-30 NOTE — Telephone Encounter (Signed)
Wife of patient called asking for a call back to discuss patient's B/p reading 12/12 - 157/93 12/13 - 148/86 12/14 - 144-91

## 2020-12-31 NOTE — Telephone Encounter (Signed)
Pt wife [name] is calling and her husband BP today is 139/81 and she would like callback

## 2020-12-31 NOTE — Telephone Encounter (Signed)
Pt advised that he was suppose to f/u 2 weeks after the blood pressure medication was changed. Pt also stated that he has not been monitoring BP as well. Pt advised that route to [facility] to see what he advises. Per pt has not had a CPE this year. Pt has been scheduled for CPE 01/07/2021.

## 2021-01-05 NOTE — Progress Notes (Signed)
[facility] Summary Report / Loop Recorder 

## 2021-01-07 ENCOUNTER — Ambulatory Visit (INDEPENDENT_AMBULATORY_CARE_PROVIDER_SITE_OTHER): Payer: 59 | Admitting: Adult Health

## 2021-01-07 ENCOUNTER — Other Ambulatory Visit (HOSPITAL_COMMUNITY): Payer: Self-pay

## 2021-01-07 VITALS — BP 130/70 | HR 85 | Temp 98.2°F | Wt 169.2 lb

## 2021-01-07 DIAGNOSIS — Z0000 Encounter for general adult medical examination without abnormal findings: Secondary | ICD-10-CM

## 2021-01-07 DIAGNOSIS — Z125 Encounter for screening for malignant neoplasm of prostate: Secondary | ICD-10-CM

## 2021-01-07 DIAGNOSIS — E785 Hyperlipidemia, unspecified: Secondary | ICD-10-CM

## 2021-01-07 DIAGNOSIS — I6381 Other cerebral infarction due to occlusion or stenosis of small artery: Secondary | ICD-10-CM | POA: Diagnosis not present

## 2021-01-07 DIAGNOSIS — K219 Gastro-esophageal reflux disease without esophagitis: Secondary | ICD-10-CM

## 2021-01-07 DIAGNOSIS — I10 Essential (primary) hypertension: Secondary | ICD-10-CM | POA: Diagnosis not present

## 2021-01-07 DIAGNOSIS — Z23 Encounter for immunization: Secondary | ICD-10-CM

## 2021-01-07 DIAGNOSIS — E1169 Type 2 diabetes mellitus with other specified complication: Secondary | ICD-10-CM

## 2021-01-07 DIAGNOSIS — M109 Gout, unspecified: Secondary | ICD-10-CM

## 2021-01-07 LAB — CBC WITH DIFFERENTIAL/PLATELET
Basophils Absolute: 0 10*3/uL (ref 0.0–0.1)
Basophils Relative: 0.4 % (ref 0.0–3.0)
Eosinophils Absolute: 0.2 10*3/uL (ref 0.0–0.7)
Eosinophils Relative: 3.3 % (ref 0.0–5.0)
HCT: 42.1 % (ref 39.0–52.0)
Hemoglobin: 14.1 g/dL (ref 13.0–17.0)
Lymphocytes Relative: 23.6 % (ref 12.0–46.0)
Lymphs Abs: 1.8 10*3/uL (ref 0.7–4.0)
MCHC: 33.5 g/dL (ref 30.0–36.0)
MCV: 89 fl (ref 78.0–100.0)
Monocytes Absolute: 0.6 10*3/uL (ref 0.1–1.0)
Monocytes Relative: 7.6 % (ref 3.0–12.0)
Neutro Abs: 4.9 10*3/uL (ref 1.4–7.7)
Neutrophils Relative %: 65.1 % (ref 43.0–77.0)
Platelets: 261 10*3/uL (ref 150.0–400.0)
RBC: 4.73 Mil/uL (ref 4.22–5.81)
RDW: 12.8 % (ref 11.5–15.5)
WBC: 7.6 10*3/uL (ref 4.0–10.5)

## 2021-01-07 LAB — COMPREHENSIVE METABOLIC PANEL
ALT: 21 U/L (ref 0–53)
AST: 18 U/L (ref 0–37)
Albumin: 4.9 g/dL (ref 3.5–5.2)
Alkaline Phosphatase: 48 U/L (ref 39–117)
BUN: 20 mg/dL (ref 6–23)
CO2: 31 mEq/L (ref 19–32)
Calcium: 10.7 mg/dL — ABNORMAL HIGH (ref 8.4–10.5)
Chloride: 102 mEq/L (ref 96–112)
Creatinine, Ser: 1.04 mg/dL (ref 0.40–1.50)
GFR: 71.9 mL/min (ref >60.00)
Glucose, Bld: 107 mg/dL — ABNORMAL HIGH (ref 70–99)
Potassium: 4.6 mEq/L (ref 3.5–5.1)
Sodium: 141 mEq/L (ref 135–145)
Total Bilirubin: 0.5 mg/dL (ref 0.2–1.2)
Total Protein: 7.7 g/dL (ref 6.0–8.3)

## 2021-01-07 LAB — LIPID PANEL
Cholesterol: 129 mg/dL (ref 0–200)
HDL: 49.6 mg/dL (ref >39.00)
LDL Cholesterol: 54 mg/dL (ref 0–99)
NonHDL: 79.4
Total CHOL/HDL Ratio: 3
Triglycerides: 129 mg/dL (ref 0.0–149.0)
VLDL: 25.8 mg/dL (ref 0.0–40.0)

## 2021-01-07 LAB — PSA: PSA: 6.09 ng/mL — ABNORMAL HIGH (ref 0.10–4.00)

## 2021-01-07 LAB — URIC ACID: Uric Acid, Serum: 6.1 mg/dL (ref 4.0–7.8)

## 2021-01-07 LAB — TSH: TSH: 1.69 u[IU]/mL (ref 0.35–5.50)

## 2021-01-07 MED ORDER — PANTOPRAZOLE SODIUM 40 MG PO TBEC
DELAYED_RELEASE_TABLET | Freq: Every day | ORAL | 3 refills | Status: DC
  Filled 2021-01-07: qty 90, 90d supply, fill #0

## 2021-01-07 NOTE — Progress Notes (Signed)
Subjective:    Patient ID: Brent Aloe Sr., male    DOB: 07/01/1950, 70 y.o.   MRN: [id number]  HPI Patient presents for yearly preventative medicine examination. He is a pleasant 70 year old male who  has a past medical history of Carpal tunnel syndrome, Gout, Hyperlipidemia, and Hypertension.  Hypertension -currently managed with Norvasc 5 mg daily and Cozaar 100 mg daily.  He denies dizziness, lightheadedness, chest pain, shortness of breath. He forgot his blood pressure readings today. He will sent me his readings via mychart.  BP Readings from Last 3 Encounters:  01/07/21 130/70  12/28/20 (!) 157/93  09/29/20 112/82   History of crypotgenic stroke -presented to the emergency room on 02/16/2020 with right-sided weakness and facial droop.  Cranial CT scan negative for acute process.  Chronic infarct right corona radiata.  CT angiogram head and neck negative for large vessel occlusion.  MRI showed acute infarct left basal ganglia and radiating white matter tracts.  He did receive tPA.  Currently on Plavix 75 mg daily and Crestor 20 mg daily.  He is followed by cardiology, neurology and physical medicine and rehab on a routine basis. Has loop recorder. He has regained strength in his right side.   Lab Results  Component Value Date   CHOL 150 09/29/2020   HDL 54 09/29/2020   LDLCALC 76 09/29/2020   LDLDIRECT 134.0 06/11/2019   TRIG 110 09/29/2020   CHOLHDL 2.8 09/29/2020   GERD-takes Protonix 40 mg daily. Well controlled.   Chronic Gout - takes colchicine 0.6 mg daily.  Controlled. No recent gout flares  All immunizations and health maintenance protocols were reviewed with the patient and needed orders were placed.  Appropriate screening laboratory values were ordered for the patient including screening of hyperlipidemia, renal function and hepatic function. If indicated by BPH, a PSA was ordered.  Medication reconciliation,  past medical history, social history, problem list and  allergies were reviewed in detail with the patient  Goals were established with regard to weight loss, exercise, and  diet in compliance with medication. He is staying active and eats healthy.   Review of Systems  Constitutional: Negative.   HENT:  Positive for tinnitus (chronic and mild).   Eyes: Negative.   Respiratory: Negative.    Cardiovascular: Negative.   Gastrointestinal: Negative.   Endocrine: Negative.   Genitourinary: Negative.   Musculoskeletal: Negative.   Skin: Negative.   Allergic/Immunologic: Negative.   Neurological: Negative.   Hematological: Negative.   Psychiatric/Behavioral: Negative.    All other systems reviewed and are negative. Past Medical History:  Diagnosis Date   Carpal tunnel syndrome    Gout    Hyperlipidemia    on meds   Hypertension    on meds    Social History   Socioeconomic History   Marital status: Married    Spouse name: Not on file   Number of children: Not on file   Years of education: Not on file   Highest education level: Not on file  Occupational History   Not on file  Tobacco Use   Smoking status: Former    Types: Cigarettes    Quit date: 01/17/1966    Years since quitting: 55.0   Smokeless tobacco: Never  Vaping Use   Vaping Use: Never used  Substance and Sexual Activity   Alcohol use: Yes    Comment: occassionally   Drug use: No   Sexual activity: Yes  Other Topics Concern   Not on file  Social History Narrative   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    Married    Two children, live locally.       Three dogs and two cats    Social Determinants of Health   Financial Resource Strain: Not on file  Food Insecurity: Not on file  Transportation Needs: Not on file  Physical Activity: Not on file  Stress: Not on file  Social Connections: Not on file  Intimate Partner Violence: Not on file    Past Surgical History:  Procedure Laterality Date   COLONOSCOPY   2015   DJ-MAC-polyps   LOOP RECORDER INSERTION N/A 02/19/2020   Procedure: [place];  Surgeon: Constance Haw, MD;  Location: [facility];  Service: Cardiovascular;  Laterality: N/A;   no prior surgery     POLYPECTOMY  2015   DJ-MAC-polyps    Family History  Problem Relation Age of Onset   Ovarian cancer Mother    Heart disease Father    Hypertension Father    Colon polyps Sister 76   Colon cancer Neg Hx    Esophageal cancer Neg Hx    Rectal cancer Neg Hx    Stomach cancer Neg Hx     No Known Allergies  Current Outpatient Medications on File Prior to Visit  Medication Sig Dispense Refill   acetaminophen (TYLENOL) 325 MG tablet Take 2 tablets (650 mg total) by mouth every 4 (four) hours as needed for mild pain (or temp > 37.5 C (99.5 F)).     amLODipine (NORVASC) 5 MG tablet Take 1 tablet (5 mg total) by mouth daily. 90 tablet 2   Baclofen 5 MG TABS Take 1 tablet by mouth 3 (three) times daily for 2 weeks, then take 2 tablets  (10 mg) three times daily for spasticity (Patient taking differently: Take 5 mg by mouth as needed.) 180 tablet 5   cholecalciferol (VITAMIN D) 25 MCG (1000 UNIT) tablet TAKE 1 TABLET BY MOUTH DAILY 100 tablet 0   cholecalciferol (VITAMIN D3) 25 MCG (1000 UNIT) tablet Take 1 tablet (1,000 Units total) by mouth daily. 30 tablet 0   clopidogrel (PLAVIX) 75 MG tablet TAKE 1 TABLET BY MOUTH DAILY 90 tablet 3   colchicine 0.6 MG tablet TAKE 1 TABLET BY MOUTH 2 TIMES DAILY 180 tablet 1   losartan (COZAAR) 100 MG tablet TAKE 1 TABLET BY MOUTH DAILY. 90 tablet 3   melatonin 3 MG TABS tablet TAKE 1 TABLET (3 MG TOTAL) BY MOUTH AT BEDTIME. 60 tablet 0   pantoprazole (PROTONIX) 40 MG tablet TAKE 1 TABLET BY MOUTH AT BEDTIME 90 tablet 1   rosuvastatin (CRESTOR) 20 MG tablet TAKE 1 TABLET BY MOUTH DAILY 90 tablet 0   No current facility-administered medications on file prior to visit.    BP 130/70 (BP Location: Left Arm, Patient Position:  Sitting, Cuff Size: Normal)    Pulse 85    Temp 98.2 F (36.8 C) (Oral)    Wt 169 lb 3.2 oz (76.7 kg)    SpO2 98%    BMI 24.28 kg/m       Objective:   Physical Exam Vitals and nursing note reviewed.  Constitutional:      General: He is not in acute distress.    Appearance: Normal appearance. He is well-developed and normal weight.  HENT:     Head: Normocephalic and atraumatic.     Right Ear: Tympanic membrane,  ear canal and external ear normal. There is no impacted cerumen.     Left Ear: Tympanic membrane, ear canal and external ear normal. There is no impacted cerumen.     Nose: Nose normal. No congestion or rhinorrhea.     Mouth/Throat:     Mouth: Mucous membranes are moist.     Pharynx: Oropharynx is clear. No oropharyngeal exudate or posterior oropharyngeal erythema.  Eyes:     General:        Right eye: No discharge.        Left eye: No discharge.     Extraocular Movements: Extraocular movements intact.     Conjunctiva/sclera: Conjunctivae normal.     Pupils: Pupils are equal, round, and reactive to light.  Neck:     Vascular: No carotid bruit.     Trachea: No tracheal deviation.  Cardiovascular:     Rate and Rhythm: Normal rate and regular rhythm.     Pulses: Normal pulses.     Heart sounds: Normal heart sounds. No murmur heard.   No friction rub. No gallop.  Pulmonary:     Effort: Pulmonary effort is normal. No respiratory distress.     Breath sounds: Normal breath sounds. No stridor. No wheezing, rhonchi or rales.  Chest:     Chest wall: No tenderness.  Abdominal:     General: Bowel sounds are normal. There is no distension.     Palpations: Abdomen is soft. There is no mass.     Tenderness: There is no abdominal tenderness. There is no right CVA tenderness, left CVA tenderness, guarding or rebound.     Hernia: No hernia is present.  Musculoskeletal:        General: No swelling, tenderness, deformity or signs of injury. Normal range of motion.     Right lower  leg: No edema.     Left lower leg: No edema.  Lymphadenopathy:     Cervical: No cervical adenopathy.  Skin:    General: Skin is warm and dry.     Capillary Refill: Capillary refill takes less than 2 seconds.     Coloration: Skin is not jaundiced or pale.     Findings: No bruising, erythema, lesion or rash.  Neurological:     General: No focal deficit present.     Mental Status: He is alert and oriented to person, place, and time.     Cranial Nerves: No cranial nerve deficit.     Sensory: No sensory deficit.     Motor: No weakness.     Coordination: Coordination normal.     Gait: Gait normal.     Deep Tendon Reflexes: Reflexes normal.  Psychiatric:        Mood and Affect: Mood normal.        Behavior: Behavior normal.        Thought Content: Thought content normal.        Judgment: Judgment normal.      Assessment & Plan:  1. Routine general medical examination at a health care facility  - CBC with Differential/Platelet; Future - Comprehensive metabolic panel; Future - Lipid panel; Future - TSH; Future  2. Essential hypertension - Controlled - Send me at home readings via mychart  - CBC with Differential/Platelet; Future - Comprehensive metabolic panel; Future - Lipid panel; Future - TSH; Future  3. Hyperlipidemia associated with type 2 diabetes mellitus (HCC)  - CBC with Differential/Platelet; Future - Comprehensive metabolic panel; Future - Lipid panel; Future - TSH; Future  4. Prostate cancer screening  - PSA; Future  5. Left sided lacunar infarction [facility]) - Follow up with Cardiology and Neurology as directed - Continue with Plavix and staitn   6. Acute gout involving toe of right foot, unspecified cause - Continue with Cholchicine 0.6 mg daily  - Uric Acid; Future  7. Gastroesophageal reflux disease without esophagitis  - pantoprazole (PROTONIX) 40 MG tablet; TAKE 1 TABLET BY MOUTH AT BEDTIME  Dispense: 90 tablet; Refill: 3  8. Need for shingles  vaccine  - Varicella-zoster vaccine IM (Shingrix)  Dorothyann Peng, NP

## 2021-01-07 NOTE — Patient Instructions (Signed)
It was great seeing you today   We will follow up with you regarding your lab work   Please let me know if you need anything   Send me your blood pressure results via mychart

## 2021-01-08 ENCOUNTER — Other Ambulatory Visit: Payer: Self-pay

## 2021-01-08 DIAGNOSIS — R972 Elevated prostate specific antigen [PSA]: Secondary | ICD-10-CM

## 2021-01-22 ENCOUNTER — Other Ambulatory Visit (HOSPITAL_COMMUNITY): Payer: Self-pay

## 2021-02-01 ENCOUNTER — Ambulatory Visit (INDEPENDENT_AMBULATORY_CARE_PROVIDER_SITE_OTHER): Payer: 59

## 2021-02-01 DIAGNOSIS — I639 Cerebral infarction, unspecified: Secondary | ICD-10-CM | POA: Diagnosis not present

## 2021-02-01 LAB — CUP PACEART REMOTE DEVICE CHECK
Date Time Interrogation Session: 20230116000511
Implantable Pulse Generator Implant Date: 20220202

## 2021-02-10 ENCOUNTER — Telehealth: Payer: Self-pay | Admitting: Adult Health

## 2021-02-10 NOTE — Telephone Encounter (Signed)
Brent Mccormick called from [facility] because they have received the referral but there are no office notes or labs attached. Brent Mccormick is asking for last two office notes and last three PSA results. If there is not three PSA results, they will take whatever is there.   Good callback number is [phone] Ext [phone] for [facility]          Please advise

## 2021-02-10 NOTE — Telephone Encounter (Signed)
Referral coordinator checking on the below.

## 2021-02-11 NOTE — Progress Notes (Signed)
[facility] Summary Report / Loop Recorder 

## 2021-02-12 ENCOUNTER — Other Ambulatory Visit (HOSPITAL_COMMUNITY): Payer: Self-pay

## 2021-02-12 ENCOUNTER — Other Ambulatory Visit: Payer: Self-pay | Admitting: Adult Health

## 2021-02-12 MED ORDER — AMLODIPINE BESYLATE 5 MG PO TABS
5.0000 mg | ORAL_TABLET | Freq: Every day | ORAL | 2 refills | Status: DC
  Filled 2021-02-12: qty 90, 90d supply, fill #0
  Filled 2021-05-14: qty 90, 90d supply, fill #1
  Filled 2021-08-13: qty 90, 90d supply, fill #2

## 2021-02-17 ENCOUNTER — Other Ambulatory Visit (HOSPITAL_COMMUNITY): Payer: Self-pay

## 2021-02-18 DIAGNOSIS — R972 Elevated prostate specific antigen [PSA]: Secondary | ICD-10-CM | POA: Diagnosis not present

## 2021-02-19 ENCOUNTER — Other Ambulatory Visit (HOSPITAL_COMMUNITY): Payer: Self-pay

## 2021-02-25 ENCOUNTER — Other Ambulatory Visit: Payer: Self-pay | Admitting: Urology

## 2021-02-25 DIAGNOSIS — R972 Elevated prostate specific antigen [PSA]: Secondary | ICD-10-CM

## 2021-03-06 LAB — CUP PACEART REMOTE DEVICE CHECK
Date Time Interrogation Session: 20230218000431
Implantable Pulse Generator Implant Date: 20220202

## 2021-03-08 ENCOUNTER — Ambulatory Visit (INDEPENDENT_AMBULATORY_CARE_PROVIDER_SITE_OTHER): Payer: 59

## 2021-03-08 DIAGNOSIS — I639 Cerebral infarction, unspecified: Secondary | ICD-10-CM | POA: Diagnosis not present

## 2021-03-12 NOTE — Progress Notes (Signed)
[facility] Summary Report / Loop Recorder 

## 2021-03-30 ENCOUNTER — Other Ambulatory Visit: Payer: Self-pay | Admitting: Adult Health

## 2021-03-30 ENCOUNTER — Other Ambulatory Visit (HOSPITAL_COMMUNITY): Payer: Self-pay

## 2021-03-31 ENCOUNTER — Other Ambulatory Visit (HOSPITAL_COMMUNITY): Payer: Self-pay

## 2021-03-31 MED ORDER — ROSUVASTATIN CALCIUM 20 MG PO TABS
ORAL_TABLET | Freq: Every day | ORAL | 0 refills | Status: DC
  Filled 2021-03-31: qty 90, 90d supply, fill #0

## 2021-04-01 ENCOUNTER — Other Ambulatory Visit: Payer: 59

## 2021-04-06 NOTE — Progress Notes (Signed)
[facility] [address] [place]. Brent Mccormick [address] [phone]       STROKE FOLLOW UP NOTE  [name] Date of Birth:  1950-09-17 Medical Record Number:  [id number]   Reason for Referral: stroke follow up    SUBJECTIVE:   CHIEF COMPLAINT:  Chief Complaint  Patient presents with   Follow-up    Rm 3 here for 6 month f/u; pt reports he has been doing well since his last visit.      HPI:   Update 04/07/2021 JM: Patient returns for 63-month stroke follow-up unaccompanied.  Overall stable without new stroke/TIA symptoms.  Reports great improvement of right arm, does not believe he has any residual weakness and denies any additional shoulder pain.  He continues to maintain ADLs and IADLs independently as well as driving.  Continues to work doing Systems developer.  Compliant on Plavix and Crestor, denies side effects.  Blood pressure today 126/84.  Loop recorder has not shown atrial fibrillation thus far.  No new concerns at this time.    History provided for reference purposes only Update 09/29/2020 JM: Brent Mccormick returns for stroke follow-up after prior visit 6 months ago.  Overall doing well.  Completed therapies approximately 1 month ago.  Continues to do HEP for residual RUE weakness with spasticity continues to make progress daily.  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 Did receive steroid injections in right shoulder 3 months ago with improvement - received additional injection yesterday with Dr. Birdie Riddle - reports some improvement already! He does report occasional dizziness with quick position change usually improves quickly but this morning had increased dizziness when his eyes were closed. He does report occasional episodes of dizziness since his stroke. Denies new stroke/TIA symptoms.  Remains on Plavix and Crestor.  Blood pressure today 112/82. Routinely monitors at home which has been stable since PCP  adjusted BP meds. Loop recorder has not shown atrial fibrillation thus far.  No further concerns at this time.  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 Brent Mierzejewski Sr. is a 71 y.o. male with PMHx of hypertension, hyperlipidemia, diabetes, gout, former smoker quit in 1968 who presented to [facility] ED on 02/16/2020 with right arm and right leg weakness and right facial droop.  Personally reviewed hospitalization pertinent progress notes, lab work and imaging with summary provided.  Evaluated by Dr. Pearlean Brownie with stroke work-up revealing acute ischemic infarct in the left basal ganglia s/p tPA, likely embolic given large size secondary to unknown source.  Recommended placement of loop recorder to evaluate for possible A. fib as etiology.  DAPT for 3 weeks and Plavix  as on aspirin PTA.  LDL 121 rx'd Crestor PTA with noncompliance and initiated Crestor 20 mg daily.  Controlled DM with A1c 5.5.  Other stroke risk factors include HTN, former tobacco use, EtOH use, possible CHF and advanced age but no prior stroke history.   Stroke: Acute ischemic infarct in left basal  ganglia, likely due cryptogenic given large size of the infarct code Stroke CT head No acute abnormality.ASPECTS 10.  CTA head & neck :  Negative for intracranial large vessel occlusion MRI : Acute infarction in the left basal ganglia and radiating white matter tracts. 2D Echo : 60-65% There is severe left ventricular hypertrophy of the basal-septal segment. Left ventricular diastolic parameters are consistent with Grade I diastolic dysfunction (impaired relaxation). Loop recorder placed 02/19/2020 LDL 121 HgbA1c 5.5 aspirin 81 mg daily prior to admission, For now due to post tPA. Recommend aspirin 81 mg and Plavix 75 mg for 3 weeks and then continue Plavix alone. Therapy recommendations: CIR Disposition: CIR on 02/21/2020      ROS:   14 system review of systems performed and negative with exception of those listed in HPI  PMH:  Past Medical History:  Diagnosis Date   Carpal tunnel syndrome    Gout    Hyperlipidemia    on meds   Hypertension    on meds    PSH:  Past Surgical History:  Procedure Laterality Date   COLONOSCOPY  2015   DJ-MAC-polyps   LOOP RECORDER INSERTION N/A 02/19/2020   Procedure: LOOP RECORDER INSERTION;  Surgeon: Regan Lemming, MD;  Location: [facility];  Service: Cardiovascular;  Laterality: N/A;   no prior surgery     POLYPECTOMY  2015   DJ-MAC-polyps    Social History:  Social History   Socioeconomic History   Marital status: Married    Spouse name: Not on file   Number of children: Not on file   Years of education: Not on file   Highest education level: Not on file  Occupational History   Not on file  Tobacco Use   Smoking status: Former    Types: Cigarettes    Quit date: 01/17/1966    Years since quitting: 55.2   Smokeless tobacco: Never  Vaping Use   Vaping Use: Never used  Substance and Sexual Activity   Alcohol use: Yes    Comment: occassionally   Drug use: No   Sexual activity: Yes  Other Topics Concern   Not on  file  Social History Narrative   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    Married    Two children, live locally.       Three dogs and two cats    Social Determinants of Health   Financial Resource Strain: Not on file  Food Insecurity: Not on file  Transportation Needs: Not on file  Physical Activity: Not on file  Stress: Not on file  Social Connections: Not on file  Intimate Partner Violence: Not on file    Family History:  Family History  Problem Relation Age of Onset   Ovarian cancer Mother    Heart disease Father    Hypertension Father    Colon polyps Sister 75   Colon cancer Neg Hx    Esophageal cancer Neg Hx    Rectal cancer Neg Hx    Stomach cancer Neg Hx     Medications:   Current Outpatient Medications on File Prior to  Visit  Medication Sig Dispense Refill   amLODipine (NORVASC) 5 MG tablet Take 1 tablet (5 mg total) by mouth daily. 90 tablet 2   cholecalciferol (VITAMIN D3) 25 MCG (1000 UNIT) tablet Take 1 tablet (1,000 Units total) by mouth daily. 30 tablet 0   clopidogrel (PLAVIX) 75 MG tablet TAKE 1 TABLET BY MOUTH DAILY 90 tablet 3   colchicine 0.6 MG tablet TAKE 1 TABLET BY MOUTH 2 TIMES DAILY (Patient taking differently: Take by mouth daily.) 180 tablet 1   losartan (COZAAR) 100 MG tablet TAKE 1 TABLET BY MOUTH DAILY. 90 tablet 3   pantoprazole (PROTONIX) 40 MG tablet TAKE 1 TABLET BY MOUTH AT BEDTIME 90 tablet 3   rosuvastatin (CRESTOR) 20 MG tablet TAKE 1 TABLET BY MOUTH DAILY 90 tablet 0   No current facility-administered medications on file prior to visit.    Allergies:  No Known Allergies    OBJECTIVE:  Physical Exam  Vitals:   04/07/21 1018  BP: 126/84  Pulse: 68  SpO2: 98%  Weight: 170 lb (77.1 kg)  Height: 5\' 10"  (1.778 m)     Body mass index is 24.39 kg/m. No results found.  General: well developed, well nourished,  very pleasant elderly Caucasian male, seated, in no evident  distress Head: head normocephalic and atraumatic.   Neck: supple with no carotid or supraclavicular bruits Cardiovascular: regular rate and rhythm, no murmurs Musculoskeletal: no deformity Skin:  no rash/petichiae Vascular:  Normal pulses all extremities   Neurologic Exam Mental Status: Awake and fully alert.   Fluent speech and language.  Oriented to place and time. Recent and remote memory intact. Attention span, concentration and fund of knowledge appropriate. Mood and affect appropriate.  Cranial Nerves: Pupils equal, briskly reactive to light. Extraocular movements full without nystagmus. Visual fields full to confrontation. Hearing intact. Facial sensation intact. Face, tongue, palate moves normally and symmetrically.  Motor: Normal bulk and tone. Full and equal strength in upper and lower extremities Sensory.: intact to touch , pinprick , position and vibratory sensation.  Coordination: Rapid alternating movements normal in all extremities. Finger-to-nose and heel-to-shin performed accurately bilaterally.   Gait and Station: Arises from chair without difficulty. Stance is normal. Gait demonstrates normal stride length and balance without use of assistive device.  Able to tandem walk and heel toe without difficulty. Reflexes: 1+ and symmetric. Toes downgoing.        ASSESSMENT: Brent Mccormick. is a 71 y.o. year old male presented with right arm and leg weakness and right facial droop on 02/16/2020 with stroke work-up revealing acute ischemic infarct in left basal ganglia s/p tPA likely embolic given large size secondary to unknown source s/p ILR. Vascular risk factors include HTN, HLD with statin noncompliance, former tobacco use, EtOH use and possible CHF.      PLAN:  L BG stroke :  Has made excellent recovery and unable to appreciate any residual deficits on today's exam Loop recorder has not shown atrial fibrillation thus far.  Routinely monitored by cardiology Continue  clopidogrel 75 mg daily  and Crestor 20 mg daily for secondary stroke prevention.   Discussed secondary stroke prevention measures and importance of close PCP follow up for aggressive stroke risk factor management including BP goal<130/90, and HLD with LDL goal<70  Lipid panel 12/2020 LDL 54    Doing well from stroke standpoint and risk factors are managed by PCP. She may follow up PRN, as usual for our patients who are strictly being followed for  stroke. If any new neurological issues should arise, request PCP place referral for evaluation by one of our neurologists. Thank you.     CC:  PCP: Shirline Frees, NP    I spent 24 minutes of face-to-face and non-face-to-face time with patient.  This included previsit chart review, lab review, study review, electronic health record documentation, patient education regarding prior stroke, ongoing monitoring of loop recorder, secondary stroke prevention measures and importance of managing stroke risk factors and answered all other questions to patient satisfaction  Ihor Austin, [facility]  [facility] [address] [place], [place] [address]  Phone [phone] Fax [phone] Note: This document was prepared with digital dictation and possible smart phrase technology. Any transcriptional errors that result from this process are unintentional.

## 2021-04-07 ENCOUNTER — Ambulatory Visit (INDEPENDENT_AMBULATORY_CARE_PROVIDER_SITE_OTHER): Payer: 59 | Admitting: Adult Health

## 2021-04-07 ENCOUNTER — Encounter: Payer: Self-pay | Admitting: Adult Health

## 2021-04-07 VITALS — BP 126/84 | HR 68 | Ht 70.0 in | Wt 170.0 lb

## 2021-04-07 DIAGNOSIS — I639 Cerebral infarction, unspecified: Secondary | ICD-10-CM

## 2021-04-07 NOTE — Patient Instructions (Signed)
Continue clopidogrel 75 mg daily  and Crestor 20 mg daily for secondary stroke prevention ? ?Your loop recorder will continue to be monitored by cardiology for atrial fibrillation ? ?Continue to follow up with PCP regarding cholesterol and blood pressure management  ?Maintain strict control of hypertension with blood pressure goal below 130/90 and cholesterol with LDL cholesterol (bad cholesterol) goal below 70 mg/dL.  ? ?Signs of a Stroke? Follow the BEFAST method:  ?Balance Watch for a sudden loss of balance, trouble with coordination or vertigo ?Eyes Is there a sudden loss of vision in one or both eyes? Or double vision?  ?Face: Ask the person to smile. Does one side of the face droop or is it numb?  ?Arms: Ask the person to raise both arms. Does one arm drift downward? Is there weakness or numbness of a leg? ?Speech: Ask the person to repeat a simple phrase. Does the speech sound slurred/strange? Is the person confused ? ?Time: If you observe any of these signs, call 911. ? ? ? ? ? ? ? ?Thank you for coming to see [place] at [facility]. I hope we have been able to provide you high quality care today. ? ?You may receive a patient satisfaction survey over the next few weeks. We would appreciate your feedback and comments so that we may continue to improve ourselves and the health of our patients. ? ?

## 2021-04-12 ENCOUNTER — Ambulatory Visit (INDEPENDENT_AMBULATORY_CARE_PROVIDER_SITE_OTHER): Payer: 59

## 2021-04-12 DIAGNOSIS — I639 Cerebral infarction, unspecified: Secondary | ICD-10-CM

## 2021-04-13 LAB — CUP PACEART REMOTE DEVICE CHECK
Date Time Interrogation Session: 20230327000807
Implantable Pulse Generator Implant Date: 20220202

## 2021-04-20 ENCOUNTER — Other Ambulatory Visit: Payer: Self-pay | Admitting: Adult Health

## 2021-04-20 ENCOUNTER — Other Ambulatory Visit (HOSPITAL_COMMUNITY): Payer: Self-pay

## 2021-04-20 ENCOUNTER — Ambulatory Visit
Admission: RE | Admit: 2021-04-20 | Discharge: 2021-04-20 | Disposition: A | Discharge status: Home / Self Care | Payer: 59 | Source: Ambulatory Visit | Attending: Urology | Admitting: Urology

## 2021-04-20 DIAGNOSIS — K5730 Diverticulosis of large intestine without perforation or abscess without bleeding: Secondary | ICD-10-CM | POA: Diagnosis not present

## 2021-04-20 DIAGNOSIS — R972 Elevated prostate specific antigen [PSA]: Secondary | ICD-10-CM | POA: Diagnosis not present

## 2021-04-20 MED ORDER — CLOPIDOGREL BISULFATE 75 MG PO TABS
ORAL_TABLET | Freq: Every day | ORAL | 3 refills | Status: DC
  Filled 2021-04-20: qty 90, 90d supply, fill #0
  Filled 2021-07-16: qty 90, 90d supply, fill #1
  Filled 2021-10-15: qty 90, 90d supply, fill #2
  Filled 2022-01-18: qty 90, 90d supply, fill #3

## 2021-04-20 MED ORDER — GADOBENATE DIMEGLUMINE 529 MG/ML IV SOLN
15.0000 mL | Freq: Once | INTRAVENOUS | Status: AC | PRN
Start: 2021-04-20 — End: 2021-04-20
  Administered 2021-04-20: 15 mL via INTRAVENOUS

## 2021-04-21 NOTE — Progress Notes (Signed)
[facility] Summary Report / Loop Recorder 

## 2021-05-14 ENCOUNTER — Other Ambulatory Visit (HOSPITAL_COMMUNITY): Payer: Self-pay

## 2021-05-14 ENCOUNTER — Other Ambulatory Visit: Payer: Self-pay | Admitting: Adult Health

## 2021-05-14 DIAGNOSIS — I10 Essential (primary) hypertension: Secondary | ICD-10-CM

## 2021-05-14 MED ORDER — LOSARTAN POTASSIUM 100 MG PO TABS
ORAL_TABLET | Freq: Every day | ORAL | 3 refills | Status: DC
  Filled 2021-05-14: qty 90, 90d supply, fill #0
  Filled 2021-08-13: qty 90, 90d supply, fill #1
  Filled 2021-11-12: qty 90, 90d supply, fill #2
  Filled 2022-02-18: qty 90, 90d supply, fill #3

## 2021-05-15 LAB — CUP PACEART REMOTE DEVICE CHECK
Date Time Interrogation Session: 20230429000351
Implantable Pulse Generator Implant Date: 20220202

## 2021-05-17 ENCOUNTER — Ambulatory Visit (INDEPENDENT_AMBULATORY_CARE_PROVIDER_SITE_OTHER): Payer: 59

## 2021-05-17 DIAGNOSIS — I639 Cerebral infarction, unspecified: Secondary | ICD-10-CM | POA: Diagnosis not present

## 2021-05-31 DIAGNOSIS — N400 Enlarged prostate without lower urinary tract symptoms: Secondary | ICD-10-CM | POA: Diagnosis not present

## 2021-05-31 LAB — PSA: PSA: 4.4

## 2021-06-01 NOTE — Progress Notes (Signed)
[facility] Summary Report / Loop Recorder 

## 2021-06-03 DIAGNOSIS — R972 Elevated prostate specific antigen [PSA]: Secondary | ICD-10-CM | POA: Diagnosis not present

## 2021-06-03 DIAGNOSIS — N400 Enlarged prostate without lower urinary tract symptoms: Secondary | ICD-10-CM | POA: Diagnosis not present

## 2021-06-04 ENCOUNTER — Encounter: Payer: Self-pay | Admitting: Adult Health

## 2021-06-21 ENCOUNTER — Ambulatory Visit (INDEPENDENT_AMBULATORY_CARE_PROVIDER_SITE_OTHER): Payer: 59

## 2021-06-21 DIAGNOSIS — I639 Cerebral infarction, unspecified: Secondary | ICD-10-CM

## 2021-06-21 LAB — CUP PACEART REMOTE DEVICE CHECK
Date Time Interrogation Session: 20230601000611
Implantable Pulse Generator Implant Date: 20220202

## 2021-06-25 ENCOUNTER — Other Ambulatory Visit (HOSPITAL_COMMUNITY): Payer: Self-pay

## 2021-06-25 ENCOUNTER — Other Ambulatory Visit: Payer: Self-pay | Admitting: Adult Health

## 2021-06-28 ENCOUNTER — Other Ambulatory Visit (HOSPITAL_COMMUNITY): Payer: Self-pay

## 2021-06-28 MED ORDER — ROSUVASTATIN CALCIUM 20 MG PO TABS
20.0000 mg | ORAL_TABLET | Freq: Every day | ORAL | 1 refills | Status: DC
  Filled 2021-06-28: qty 90, 90d supply, fill #0
  Filled 2021-09-24: qty 90, 90d supply, fill #1

## 2021-07-07 NOTE — Progress Notes (Signed)
[facility] Summary Report / Loop Recorder 

## 2021-07-16 ENCOUNTER — Other Ambulatory Visit (HOSPITAL_COMMUNITY): Payer: Self-pay

## 2021-07-25 LAB — CUP PACEART REMOTE DEVICE CHECK
Date Time Interrogation Session: 20230704000923
Implantable Pulse Generator Implant Date: 20220202

## 2021-07-26 ENCOUNTER — Ambulatory Visit (INDEPENDENT_AMBULATORY_CARE_PROVIDER_SITE_OTHER): Payer: 59

## 2021-07-26 DIAGNOSIS — I639 Cerebral infarction, unspecified: Secondary | ICD-10-CM | POA: Diagnosis not present

## 2021-08-13 ENCOUNTER — Other Ambulatory Visit (HOSPITAL_COMMUNITY): Payer: Self-pay

## 2021-08-25 LAB — CUP PACEART REMOTE DEVICE CHECK
Date Time Interrogation Session: 20230806000852
Implantable Pulse Generator Implant Date: 20220202

## 2021-08-25 NOTE — Progress Notes (Signed)
[facility] Summary Report / Loop Recorder 

## 2021-08-30 ENCOUNTER — Ambulatory Visit: Payer: 59

## 2021-09-24 ENCOUNTER — Other Ambulatory Visit (HOSPITAL_COMMUNITY): Payer: Self-pay

## 2021-10-04 ENCOUNTER — Ambulatory Visit (INDEPENDENT_AMBULATORY_CARE_PROVIDER_SITE_OTHER): Payer: 59

## 2021-10-04 DIAGNOSIS — I639 Cerebral infarction, unspecified: Secondary | ICD-10-CM | POA: Diagnosis not present

## 2021-10-05 LAB — CUP PACEART REMOTE DEVICE CHECK
Date Time Interrogation Session: 20230918001344
Implantable Pulse Generator Implant Date: 20220202

## 2021-10-15 ENCOUNTER — Other Ambulatory Visit (HOSPITAL_COMMUNITY): Payer: Self-pay

## 2021-10-16 NOTE — Progress Notes (Signed)
[facility] Summary Report / Loop Recorder 

## 2021-10-20 ENCOUNTER — Other Ambulatory Visit (HOSPITAL_COMMUNITY): Payer: Self-pay

## 2021-10-22 ENCOUNTER — Other Ambulatory Visit (HOSPITAL_COMMUNITY): Payer: Self-pay

## 2021-11-08 ENCOUNTER — Ambulatory Visit (INDEPENDENT_AMBULATORY_CARE_PROVIDER_SITE_OTHER): Payer: 59

## 2021-11-08 DIAGNOSIS — I639 Cerebral infarction, unspecified: Secondary | ICD-10-CM | POA: Diagnosis not present

## 2021-11-09 LAB — CUP PACEART REMOTE DEVICE CHECK
Date Time Interrogation Session: 20231023001600
Implantable Pulse Generator Implant Date: 20220202

## 2021-11-12 ENCOUNTER — Other Ambulatory Visit: Payer: Self-pay | Admitting: Adult Health

## 2021-11-12 ENCOUNTER — Other Ambulatory Visit (HOSPITAL_COMMUNITY): Payer: Self-pay

## 2021-11-16 ENCOUNTER — Other Ambulatory Visit (HOSPITAL_COMMUNITY): Payer: Self-pay

## 2021-11-16 MED ORDER — AMLODIPINE BESYLATE 5 MG PO TABS
5.0000 mg | ORAL_TABLET | Freq: Every day | ORAL | 0 refills | Status: DC
  Filled 2021-11-16: qty 90, 90d supply, fill #0

## 2021-11-18 DIAGNOSIS — T7840XA Allergy, unspecified, initial encounter: Secondary | ICD-10-CM | POA: Diagnosis not present

## 2021-11-19 ENCOUNTER — Ambulatory Visit: Payer: 59 | Admitting: Adult Health

## 2021-11-19 ENCOUNTER — Encounter: Payer: Self-pay | Admitting: Adult Health

## 2021-11-19 ENCOUNTER — Other Ambulatory Visit (HOSPITAL_COMMUNITY): Payer: Self-pay

## 2021-11-19 VITALS — BP 120/80 | HR 70 | Temp 98.1°F | Ht 70.0 in | Wt 171.0 lb

## 2021-11-19 DIAGNOSIS — T783XXA Angioneurotic edema, initial encounter: Secondary | ICD-10-CM

## 2021-11-19 MED ORDER — PREDNISONE 50 MG PO TABS
ORAL_TABLET | ORAL | 0 refills | Status: DC
  Filled 2021-11-19: qty 14, 14d supply, fill #0

## 2021-11-19 MED ORDER — EPINEPHRINE 0.3 MG/0.3ML IJ SOAJ
0.3000 mg | INTRAMUSCULAR | 1 refills | Status: AC | PRN
  Filled 2021-11-19: qty 2, 30d supply, fill #0

## 2021-11-19 NOTE — Progress Notes (Signed)
Subjective:    Patient ID: Brent Aloe Sr., male    DOB: March 05, 1950, 71 y.o.   MRN: [id number]  HPI 71 year old male who  has a past medical history of Carpal tunnel syndrome, Gout, Hyperlipidemia, and Hypertension.  He presents to the office today with his wife for an acute issue   He reports yesterday morning after he had his normal breakfast which included a blueberry muffin and coffee he felt the right side of his face to swell.  He took 2 Benadryl but then his tongue started to swell, he was having trouble speaking and palliative 0 his throat was closing.  He went to urgent care and received a prednisone action and about 11 hours later his symptoms started to improve.  He no longer has any swelling to the face but his tongue continues to be slightly swollen.  This morning he had coffee and an English muffin with peanut butter without any symptoms.   Review of Systems See HPI   Past Medical History:  Diagnosis Date   Carpal tunnel syndrome    Gout    Hyperlipidemia    on meds   Hypertension    on meds    Social History   Socioeconomic History   Marital status: Married    Spouse name: Not on file   Number of children: Not on file   Years of education: Not on file   Highest education level: Not on file  Occupational History   Not on file  Tobacco Use   Smoking status: Former    Types: Cigarettes    Quit date: 01/17/1966    Years since quitting: 55.8   Smokeless tobacco: Never  Vaping Use   Vaping Use: Never used  Substance and Sexual Activity   Alcohol use: Yes    Comment: occassionally   Drug use: No   Sexual activity: Yes  Other Topics Concern   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    Married    Two children, live locally.       Three dogs and two cats    Social Determinants of Health   Financial Resource Strain: Not on file  Food Insecurity: Not on file   Transportation Needs: Not on file  Physical Activity: Not on file  Stress: Not on file  Social Connections: Not on file  Intimate Partner Violence: Not on file    Past Surgical History:  Procedure Laterality Date   COLONOSCOPY  2015   DJ-MAC-polyps   LOOP RECORDER INSERTION N/A 02/19/2020   Procedure: [place];  Surgeon: Constance Haw, MD;  Location: [facility];  Service: Cardiovascular;  Laterality: N/A;   no prior surgery     POLYPECTOMY  2015   DJ-MAC-polyps    Family History  Problem Relation Age of Onset   Ovarian cancer Mother    Heart disease Father    Hypertension Father    Colon polyps Sister 12   Colon cancer Neg Hx    Esophageal cancer Neg Hx    Rectal cancer Neg Hx    Stomach cancer Neg Hx     No Known Allergies  Current Outpatient Medications on File Prior to Visit  Medication Sig Dispense Refill   amLODipine (NORVASC) 5 MG tablet Take 1 tablet (5 mg total) by mouth daily. 90 tablet 0   cholecalciferol (VITAMIN D3) 25  MCG (1000 UNIT) tablet Take 1 tablet (1,000 Units total) by mouth daily. 30 tablet 0   clopidogrel (PLAVIX) 75 MG tablet TAKE 1 TABLET BY MOUTH DAILY 90 tablet 3   colchicine 0.6 MG tablet TAKE 1 TABLET BY MOUTH 2 TIMES DAILY (Patient taking differently: Take by mouth daily.) 180 tablet 1   losartan (COZAAR) 100 MG tablet TAKE 1 TABLET BY MOUTH DAILY. 90 tablet 3   rosuvastatin (CRESTOR) 20 MG tablet Take 1 tablet (20 mg total) by mouth daily. 90 tablet 1   No current facility-administered medications on file prior to visit.    BP 120/80   Pulse 70   Temp 98.1 F (36.7 C) (Oral)   Ht _0  (1.778 m)   Wt 171 lb (77.6 kg)   SpO2 99%   BMI 24.54 kg/m       Objective:   Physical Exam Vitals and nursing note reviewed.  Constitutional:      Appearance: Normal appearance.  HENT:     Mouth/Throat:     Lips: Pink.     Mouth: Mucous membranes are moist.     Pharynx: Oropharynx is clear. Uvula midline.      Comments: Slight swelling noted to tongue  Cardiovascular:     Rate and Rhythm: Normal rate and regular rhythm.     Pulses: Normal pulses.     Heart sounds: Normal heart sounds.  Pulmonary:     Effort: Pulmonary effort is normal.     Breath sounds: Normal breath sounds.  Musculoskeletal:        General: Normal range of motion.  Skin:    General: Skin is warm and dry.  Neurological:     General: No focal deficit present.     Mental Status: He is alert and oriented to person, place, and time.  Psychiatric:        Mood and Affect: Mood normal.        Thought Content: Thought content normal.        Judgment: Judgment normal.       Assessment & Plan:  1. Angioedema, initial encounter -Provide EpiPen and prednisone to take if he has another allergic reaction.  Will refer to allergy for formal testing. - EPINEPHrine 0.3 mg/0.3 mL IJ SOAJ injection; Inject 0.3 mg into the muscle as needed for anaphylaxis.  Dispense: 2 each; Refill: 1 - predniSONE (DELTASONE) 50 MG tablet; Take daily for 5 days for allergic reaction  Dispense: 14 tablet; Refill: 0 - Ambulatory referral to Allergy  Dorothyann Peng, NP

## 2021-11-30 NOTE — Progress Notes (Signed)
[facility] Summary Report / Loop Recorder 

## 2021-12-13 ENCOUNTER — Ambulatory Visit (INDEPENDENT_AMBULATORY_CARE_PROVIDER_SITE_OTHER): Payer: 59

## 2021-12-13 DIAGNOSIS — I639 Cerebral infarction, unspecified: Secondary | ICD-10-CM

## 2021-12-13 LAB — CUP PACEART REMOTE DEVICE CHECK
Date Time Interrogation Session: 20231127001033
Implantable Pulse Generator Implant Date: 20220202

## 2021-12-17 ENCOUNTER — Other Ambulatory Visit (HOSPITAL_COMMUNITY): Payer: Self-pay

## 2021-12-17 ENCOUNTER — Other Ambulatory Visit: Payer: Self-pay | Admitting: Adult Health

## 2021-12-17 DIAGNOSIS — M109 Gout, unspecified: Secondary | ICD-10-CM

## 2021-12-17 MED ORDER — COLCHICINE 0.6 MG PO TABS
0.6000 mg | ORAL_TABLET | Freq: Two times a day (BID) | ORAL | 1 refills | Status: DC
  Filled 2021-12-17: qty 180, 90d supply, fill #0
  Filled 2022-06-17: qty 180, 90d supply, fill #1

## 2021-12-17 MED ORDER — ROSUVASTATIN CALCIUM 20 MG PO TABS
20.0000 mg | ORAL_TABLET | Freq: Every day | ORAL | 1 refills | Status: DC
  Filled 2021-12-17: qty 90, 90d supply, fill #0
  Filled 2022-03-25: qty 90, 90d supply, fill #1

## 2021-12-18 ENCOUNTER — Other Ambulatory Visit (HOSPITAL_COMMUNITY): Payer: Self-pay

## 2021-12-20 ENCOUNTER — Other Ambulatory Visit (HOSPITAL_COMMUNITY): Payer: Self-pay

## 2021-12-22 ENCOUNTER — Encounter (HOSPITAL_COMMUNITY): Payer: Self-pay

## 2021-12-22 ENCOUNTER — Other Ambulatory Visit (HOSPITAL_COMMUNITY): Payer: Self-pay

## 2021-12-28 ENCOUNTER — Other Ambulatory Visit (HOSPITAL_COMMUNITY): Payer: Self-pay

## 2022-01-05 ENCOUNTER — Ambulatory Visit: Payer: 59 | Admitting: Allergy

## 2022-01-11 ENCOUNTER — Encounter: Payer: 59 | Admitting: Adult Health

## 2022-01-13 ENCOUNTER — Ambulatory Visit (INDEPENDENT_AMBULATORY_CARE_PROVIDER_SITE_OTHER): Payer: 59 | Admitting: Adult Health

## 2022-01-13 ENCOUNTER — Encounter: Payer: Self-pay | Admitting: Adult Health

## 2022-01-13 VITALS — BP 120/80 | HR 67 | Temp 97.9°F | Ht 68.25 in | Wt 171.0 lb

## 2022-01-13 DIAGNOSIS — I10 Essential (primary) hypertension: Secondary | ICD-10-CM

## 2022-01-13 DIAGNOSIS — R972 Elevated prostate specific antigen [PSA]: Secondary | ICD-10-CM | POA: Diagnosis not present

## 2022-01-13 DIAGNOSIS — Z8739 Personal history of other diseases of the musculoskeletal system and connective tissue: Secondary | ICD-10-CM

## 2022-01-13 DIAGNOSIS — E785 Hyperlipidemia, unspecified: Secondary | ICD-10-CM

## 2022-01-13 DIAGNOSIS — K219 Gastro-esophageal reflux disease without esophagitis: Secondary | ICD-10-CM | POA: Diagnosis not present

## 2022-01-13 DIAGNOSIS — I639 Cerebral infarction, unspecified: Secondary | ICD-10-CM

## 2022-01-13 DIAGNOSIS — Z0000 Encounter for general adult medical examination without abnormal findings: Secondary | ICD-10-CM | POA: Diagnosis not present

## 2022-01-13 LAB — CBC WITH DIFFERENTIAL/PLATELET
Basophils Absolute: 0.1 10*3/uL (ref 0.0–0.1)
Basophils Relative: 0.8 % (ref 0.0–3.0)
Eosinophils Absolute: 0.3 10*3/uL (ref 0.0–0.7)
Eosinophils Relative: 3.1 % (ref 0.0–5.0)
HCT: 44.3 % (ref 39.0–52.0)
Hemoglobin: 14.9 g/dL (ref 13.0–17.0)
Lymphocytes Relative: 22.3 % (ref 12.0–46.0)
Lymphs Abs: 1.9 10*3/uL (ref 0.7–4.0)
MCHC: 33.6 g/dL (ref 30.0–36.0)
MCV: 89.2 fl (ref 78.0–100.0)
Monocytes Absolute: 0.7 10*3/uL (ref 0.1–1.0)
Monocytes Relative: 8.6 % (ref 3.0–12.0)
Neutro Abs: 5.5 10*3/uL (ref 1.4–7.7)
Neutrophils Relative %: 65.2 % (ref 43.0–77.0)
Platelets: 289 10*3/uL (ref 150.0–400.0)
RBC: 4.97 Mil/uL (ref 4.22–5.81)
RDW: 13.3 % (ref 11.5–15.5)
WBC: 8.5 10*3/uL (ref 4.0–10.5)

## 2022-01-13 LAB — COMPREHENSIVE METABOLIC PANEL
ALT: 24 U/L (ref 0–53)
AST: 20 U/L (ref 0–37)
Albumin: 4.7 g/dL (ref 3.5–5.2)
Alkaline Phosphatase: 48 U/L (ref 39–117)
BUN: 25 mg/dL — ABNORMAL HIGH (ref 6–23)
CO2: 30 mEq/L (ref 19–32)
Calcium: 10 mg/dL (ref 8.4–10.5)
Chloride: 102 mEq/L (ref 96–112)
Creatinine, Ser: 1.1 mg/dL (ref 0.40–1.50)
GFR: 66.74 mL/min (ref >60.00)
Glucose, Bld: 99 mg/dL (ref 70–99)
Potassium: 4.6 mEq/L (ref 3.5–5.1)
Sodium: 140 mEq/L (ref 135–145)
Total Bilirubin: 0.4 mg/dL (ref 0.2–1.2)
Total Protein: 7.4 g/dL (ref 6.0–8.3)

## 2022-01-13 LAB — LIPID PANEL
Cholesterol: 168 mg/dL (ref 0–200)
HDL: 56.8 mg/dL (ref >39.00)
LDL Cholesterol: 76 mg/dL (ref 0–99)
NonHDL: 111.55
Total CHOL/HDL Ratio: 3
Triglycerides: 179 mg/dL — ABNORMAL HIGH (ref 0.0–149.0)
VLDL: 35.8 mg/dL (ref 0.0–40.0)

## 2022-01-13 LAB — URIC ACID: Uric Acid, Serum: 7.5 mg/dL (ref 4.0–7.8)

## 2022-01-13 NOTE — Patient Instructions (Signed)
It was great seeing you today   We will follow up with you regarding your lab work   Please let me know if you need anything   

## 2022-01-13 NOTE — Progress Notes (Signed)
Subjective:    Patient ID: Brent Aloe Sr., male    DOB: 02-Mar-1950, 71 y.o.   MRN: [id number]  HPI Patient presents for yearly preventative medicine examination. He is a pleasant 71 year old male who  has a past medical history of Carpal tunnel syndrome, Gout, Hyperlipidemia, and Hypertension.  Hypertension-managed with Norvasc 5 mg daily and Cozaar 100 mg daily.  She denies dizziness, lightheadedness, chest pain, or shortness of breath. BP Readings from Last 3 Encounters:  01/13/22 120/80  11/19/21 120/80  04/07/21 126/84   History of cryptogenic stroke-presented to the emergency room on 02/16/2020 with right-sided weakness and facial droop.  CT angiogram head and neck was negative for large vessel occlusion.  MRI showed acute infarct left basilar ganglia and radiating white matter tracts.  He did receive tPA.  Currently managed with Plavix 75 mg daily and Crestor 20 mg daily. He has had full recovery.   GERD-takes Protonix 40 mg daily.  Well-controlled  Chronic gout-managed with colchicine 0.6 mg daily.  He denies any recent gout flares.   Elevated PSA -is seen urology on a routine basis.  #2022 his PSA went up to 6.09.  He does report occasional BPH symptoms of decreased stream and urgency.  Did have an MRI of the prostate in April 2023 which showed a benign prostate that was 77 g.  All immunizations and health maintenance protocols were reviewed with the patient and needed orders were placed.  Appropriate screening laboratory values were ordered for the patient including screening of hyperlipidemia, renal function and hepatic function. If indicated by BPH, a PSA was ordered.  Medication reconciliation,  past medical history, social history, problem list and allergies were reviewed in detail with the patient  Goals were established with regard to weight loss, exercise, and  diet in compliance with medications. He is staying active and trying to eat healthy.  Wt Readings from Last  3 Encounters:  01/13/22 171 lb (77.6 kg)  11/19/21 171 lb (77.6 kg)  04/07/21 170 lb (77.1 kg)   Review of Systems  Constitutional: Negative.   HENT: Negative.    Eyes: Negative.   Respiratory: Negative.    Cardiovascular: Negative.   Gastrointestinal: Negative.   Endocrine: Negative.   Genitourinary: Negative.   Musculoskeletal: Negative.   Skin: Negative.   Allergic/Immunologic: Negative.   Neurological: Negative.   Hematological: Negative.   Psychiatric/Behavioral: Negative.    All other systems reviewed and are negative.  Past Medical History:  Diagnosis Date   Carpal tunnel syndrome    Gout    Hyperlipidemia    on meds   Hypertension    on meds    Social History   Socioeconomic History   Marital status: Married    Spouse name: Not on file   Number of children: Not on file   Years of education: Not on file   Highest education level: Not on file  Occupational History   Not on file  Tobacco Use   Smoking status: Former    Types: Cigarettes    Quit date: 01/17/1966    Years since quitting: 56.0   Smokeless tobacco: Never  Vaping Use   Vaping Use: Never used  Substance and Sexual Activity   Alcohol use: Yes    Comment: occassionally   Drug use: No   Sexual activity: Yes  Other Topics Concern   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    Married    Two children, live locally.       Three dogs and two cats    Social Determinants of Health   Financial Resource Strain: Not on file  Food Insecurity: Not on file  Transportation Needs: Not on file  Physical Activity: Not on file  Stress: Not on file  Social Connections: Not on file  Intimate Partner Violence: Not on file    Past Surgical History:  Procedure Laterality Date   COLONOSCOPY  2015   DJ-MAC-polyps   LOOP RECORDER INSERTION N/A 02/19/2020   Procedure: [place];  Surgeon: Constance Haw, MD;  Location: [facility];  Service: Cardiovascular;  Laterality: N/A;   no prior surgery     POLYPECTOMY  2015   DJ-MAC-polyps    Family History  Problem Relation Age of Onset   Ovarian cancer Mother    Heart disease Father    Hypertension Father    Colon polyps Sister 19   Colon cancer Neg Hx    Esophageal cancer Neg Hx    Rectal cancer Neg Hx    Stomach cancer Neg Hx     No Known Allergies  Current Outpatient Medications on File Prior to Visit  Medication Sig Dispense Refill   amLODipine (NORVASC) 5 MG tablet Take 1 tablet (5 mg total) by mouth daily. 90 tablet 0   cholecalciferol (VITAMIN D3) 25 MCG (1000 UNIT) tablet Take 1 tablet (1,000 Units total) by mouth daily. 30 tablet 0   clopidogrel (PLAVIX) 75 MG tablet TAKE 1 TABLET BY MOUTH DAILY 90 tablet 3   colchicine 0.6 MG tablet Take 1 tablet (0.6 mg total) by mouth 2 (two) times daily. 180 tablet 1   EPINEPHrine 0.3 mg/0.3 mL IJ SOAJ injection Inject 0.3 mg into the muscle as needed for anaphylaxis. 2 each 1   losartan (COZAAR) 100 MG tablet TAKE 1 TABLET BY MOUTH DAILY. 90 tablet 3   rosuvastatin (CRESTOR) 20 MG tablet Take 1 tablet (20 mg total) by mouth daily. 90 tablet 1   No current facility-administered medications on file prior to visit.    BP 120/80   Pulse 67   Temp 97.9 F (36.6 C) (Oral)   Ht 5' 8.25" (1.734 m)   Wt 171 lb (77.6 kg)   SpO2 99%   BMI 25.81 kg/m       Objective:   Physical Exam Vitals and nursing note reviewed.  Constitutional:      General: He is not in acute distress.    Appearance: Normal appearance. He is well-developed and normal weight.  HENT:     Head: Normocephalic and atraumatic.     Right Ear: Tympanic membrane, ear canal and external ear normal. There is no impacted cerumen.     Left Ear: Tympanic membrane, ear canal and external ear normal. There is no impacted cerumen.     Nose: Nose normal. No congestion or rhinorrhea.     Mouth/Throat:     Mouth:  Mucous membranes are moist.     Pharynx: Oropharynx is clear. No oropharyngeal exudate or posterior oropharyngeal erythema.  Eyes:     General:        Right eye: No discharge.        Left eye: No discharge.     Extraocular Movements: Extraocular movements intact.     Conjunctiva/sclera: Conjunctivae normal.     Pupils: Pupils are equal, round, and reactive to light.  Neck:  Vascular: No carotid bruit.     Trachea: No tracheal deviation.  Cardiovascular:     Rate and Rhythm: Normal rate and regular rhythm.     Pulses: Normal pulses.     Heart sounds: Normal heart sounds. No murmur heard.    No friction rub. No gallop.  Pulmonary:     Effort: Pulmonary effort is normal. No respiratory distress.     Breath sounds: Normal breath sounds. No stridor. No wheezing, rhonchi or rales.  Chest:     Chest wall: No tenderness.  Abdominal:     General: Abdomen is flat. Bowel sounds are normal. There is no distension.     Palpations: Abdomen is soft. There is no mass.     Tenderness: There is no abdominal tenderness. There is no right CVA tenderness, left CVA tenderness, guarding or rebound.     Hernia: No hernia is present.  Musculoskeletal:        General: No swelling, tenderness, deformity or signs of injury. Normal range of motion.     Right lower leg: No edema.     Left lower leg: No edema.  Lymphadenopathy:     Cervical: No cervical adenopathy.  Skin:    General: Skin is warm and dry.     Capillary Refill: Capillary refill takes less than 2 seconds.     Coloration: Skin is not jaundiced or pale.     Findings: No bruising, erythema, lesion or rash.  Neurological:     General: No focal deficit present.     Mental Status: He is alert and oriented to person, place, and time.     Cranial Nerves: No cranial nerve deficit.     Sensory: No sensory deficit.     Motor: No weakness.     Coordination: Coordination normal.     Gait: Gait normal.     Deep Tendon Reflexes: Reflexes normal.   Psychiatric:        Mood and Affect: Mood normal.        Behavior: Behavior normal.        Thought Content: Thought content normal.        Judgment: Judgment normal.       Assessment & Plan:  Today patient counseled on age appropriate routine health concerns for screening and prevention, each reviewed and up to date or declined. Immunizations reviewed and up to date or declined. Labs ordered and reviewed. Risk factors for depression reviewed and negative. Hearing function and visual acuity are intact. ADLs screened and addressed as needed. Functional ability and level of safety reviewed and appropriate. Education, counseling and referrals performed based on assessed risks today. Patient provided with a copy of personalized plan for preventive services. - Flu shot given today advised pharmacy for Tdap   2. Cryptogenic stroke ([place]) - Continue with statin and plavix - Follow up with Cardiology and Neurology as directed  - CBC with Differential/Platelet; Future - Comprehensive metabolic panel; Future - Lipid panel; Future - TSH; Future  3. Essential hypertension - Well controlled. No change in medication  - CBC with Differential/Platelet; Future - Comprehensive metabolic panel; Future - Lipid panel; Future - TSH; Future  4. Hyperlipidemia  - Consider increase in statin  - CBC with Differential/Platelet; Future - Comprehensive metabolic panel; Future - Lipid panel; Future - TSH; Future  5.Elevated PSA  - PSA; Future  6. Gastroesophageal reflux disease without esophagitis - Continue PPI  - CBC with Differential/Platelet; Future - Comprehensive metabolic panel; Future - Lipid panel;  Future - TSH; Future  7. History of gout  - Uric Acid; Future  Dorothyann Peng, NP

## 2022-01-14 LAB — TSH: TSH: 1.68 u[IU]/mL (ref 0.35–5.50)

## 2022-01-14 LAB — PSA: PSA: 6.09 ng/mL — ABNORMAL HIGH (ref 0.10–4.00)

## 2022-01-18 ENCOUNTER — Ambulatory Visit (INDEPENDENT_AMBULATORY_CARE_PROVIDER_SITE_OTHER): Payer: Commercial Managed Care - PPO

## 2022-01-18 ENCOUNTER — Other Ambulatory Visit (HOSPITAL_COMMUNITY): Payer: Self-pay

## 2022-01-18 DIAGNOSIS — I639 Cerebral infarction, unspecified: Secondary | ICD-10-CM

## 2022-01-18 LAB — CUP PACEART REMOTE DEVICE CHECK
Date Time Interrogation Session: 20240102001029
Implantable Pulse Generator Implant Date: 20220202

## 2022-01-24 NOTE — Progress Notes (Signed)
[facility] Summary Report / Loop Recorder 

## 2022-02-08 ENCOUNTER — Ambulatory Visit: Payer: Medicare Other | Admitting: Internal Medicine

## 2022-02-16 NOTE — Progress Notes (Signed)
[facility] Summary Report / Loop Recorder

## 2022-02-18 ENCOUNTER — Other Ambulatory Visit (HOSPITAL_COMMUNITY): Payer: Self-pay

## 2022-02-18 ENCOUNTER — Other Ambulatory Visit: Payer: Self-pay | Admitting: Adult Health

## 2022-02-18 MED ORDER — AMLODIPINE BESYLATE 5 MG PO TABS
5.0000 mg | ORAL_TABLET | Freq: Every day | ORAL | 0 refills | Status: DC
  Filled 2022-02-18: qty 90, 90d supply, fill #0

## 2022-02-20 LAB — CUP PACEART REMOTE DEVICE CHECK
Date Time Interrogation Session: 20240204000921
Implantable Pulse Generator Implant Date: 20220202

## 2022-02-21 ENCOUNTER — Ambulatory Visit: Payer: Commercial Managed Care - PPO

## 2022-02-21 ENCOUNTER — Other Ambulatory Visit: Payer: Self-pay

## 2022-02-21 DIAGNOSIS — I639 Cerebral infarction, unspecified: Secondary | ICD-10-CM | POA: Diagnosis not present

## 2022-03-04 ENCOUNTER — Other Ambulatory Visit: Payer: Self-pay

## 2022-03-04 ENCOUNTER — Ambulatory Visit: Payer: Commercial Managed Care - PPO | Admitting: Internal Medicine

## 2022-03-04 ENCOUNTER — Other Ambulatory Visit (HOSPITAL_COMMUNITY): Payer: Self-pay

## 2022-03-04 ENCOUNTER — Encounter: Payer: Self-pay | Admitting: Internal Medicine

## 2022-03-04 VITALS — BP 138/76 | HR 75 | Temp 98.2°F | Resp 14 | Ht 70.0 in | Wt 173.4 lb

## 2022-03-04 DIAGNOSIS — L501 Idiopathic urticaria: Secondary | ICD-10-CM

## 2022-03-04 DIAGNOSIS — J3089 Other allergic rhinitis: Secondary | ICD-10-CM

## 2022-03-04 DIAGNOSIS — R040 Epistaxis: Secondary | ICD-10-CM

## 2022-03-04 MED ORDER — FEXOFENADINE HCL 180 MG PO TABS
180.0000 mg | ORAL_TABLET | Freq: Two times a day (BID) | ORAL | 5 refills | Status: AC | PRN
  Filled 2022-03-04: qty 60, 30d supply, fill #0

## 2022-03-04 MED ORDER — FAMOTIDINE 20 MG PO TABS
20.0000 mg | ORAL_TABLET | Freq: Two times a day (BID) | ORAL | 5 refills | Status: AC | PRN
  Filled 2022-03-04: qty 60, 30d supply, fill #0

## 2022-03-04 NOTE — Patient Instructions (Addendum)
Idiopathic Urticaria/Angioedema (Hives/Swelling): - At this time etiology of hives and swelling is unknown. Hives can be caused by a variety of different triggers including illness/infection, exercise, pressure, vibrations, extremes of temperature to name a few however majority of the time there is no identifiable trigger.  -If this persists, we will obtain labs to rule out inflammatory/autoimmune/alpha gal/mast cell causes. -Start Allegra (Fexofenadine) 175m daily.   -If this reoccurs, increase to Allegra 1879mtwice daily.   -If this still reoccurs, add Pepcid (Famotidine) 2060mwice daily and continue Allegra 180m70mice daily. -If still no improvement, increase to Allegra 360mg29mce daily and Pepcid 40mg 88me daily. -Okay to keep Epipen but only for laryngeal involvement with trouble breathing and trouble swallowing.   Allergic Rhinitis: - Positive skin test to: dust mite and mold - Avoidance measures discussed. - Use nasal saline rinses before nose sprays such as with Neilmed Sinus Rinse.  Use distilled water.   - Use Allegra 180mg d65m.   - Consider allergy shots as long term control of your symptoms by teaching your immune system to be more tolerant of your allergy triggers  Epistaxis -Pinch both nostrils while leaning forward for at least 5 minutes before checking to see if the bleeding has stopped. If bleeding is not controlled within 5-10 minutes apply a cotton ball soaked with oxymetazoline (Afrin) to the bleeding nostril for a few seconds.  -If the problem persists or worsens a referral to ENT for further evaluation may be necessary.   ALLERGEN AVOIDANCE MEASURES   Dust Mites Use central air conditioning and heat; and change the filter monthly.  Pleated filters work better than mesh filters.  Electrostatic filters may also be used; wash the filter monthly.  Window air conditioners may be used, but do not clean the air as well as a central air conditioner.  Change or wash the  filter monthly. Keep windows closed.  Do not use attic fans.   Encase the mattress, box springs and pillows with zippered, dust proof covers. Wash the bed linens in hot water weekly.   Remove carpet, especially from the bedroom. Remove stuffed animals, throw pillows, dust ruffles, heavy drapes and other items that collect dust from the bedroom. Do not use a humidifier.   Use wood, vinyl or leather furniture instead of cloth furniture in the bedroom. Keep the indoor humidity at 30 - 40%.  Monitor with a humidity gauge.  Molds - Indoor avoidance Use air conditioning to reduce indoor humidity.  Do not use a humidifier. Keep indoor humidity at 30 - 40%.  Use a dehumidifier if needed. In the bathroom use an exhaust fan or open a window after showering.  Wipe down damp surfaces after showering.  Clean bathrooms with a mold-killing solution (diluted bleach, or products like Tilex, etc) at least once a month. In the kitchen use an exhaust fan to remove steam from cooking.  Throw away spoiled foods immediately, and empty garbage daily.  Empty water pans below self-defrosting refrigerators frequently. Vent the clothes dryer to the outside. Limit indoor houseplants; mold grows in the dirt.  No houseplants in the bedroom. Remove carpet from the bedroom. Encase the mattress and box springs with a zippered encasing.  Molds - Outdoor avoidance Avoid being outside when the grass is being mowed, or the ground is tilled. Avoid playing in leaves, pine straw, hay, etc.  Dead plant materials contain mold. Avoid going into barns or grain storage areas. Remove leaves, clippings and compost from around the home.

## 2022-03-04 NOTE — Progress Notes (Signed)
NEW PATIENT  Date of Service/Encounter:  03/07/22  Consult requested by: Dorothyann Peng, NP   Subjective:   Brent Aloe Sr. (DOB: February 09, 1950) is a 72 y.o. male who presents to the clinic on 03/04/2022 with a chief complaint of Allergic Rhinitis  and Allergy Testing .    History obtained from: chart review and patient.   Hives/Swelling: Reports onset around December 2023 with hives and swelling.  Has had about 3 episodes with tongue swelling since then that resolved with benadryl and lasted in total about 5-6 hours.  No trouble breathing or swallowing.  Also has had itching of the palms and soles that then turn red and cause welts going down his arms.  Last about 3 hours.  No illness or new medications.  Never on lisinopril. No family history of angioedema.  No prior history of hives or swelling in the past. No clear triggers as he had muffin that caused it but then was able to eat it without any issues.  Has not been able to track down any specific foods that cause it either.  Was given an Epipen by PCP.   Epistaxis: Happens about twice a week and lasts for a few minutes.  Uses a cold cloth and it resolves.  Generally happens after blowing too hard.  Can be in the R or L nostril but more commonly L.   Rhinitis:  Started a couple of years ago Symptoms include: nasal congestion and rhinorrhea worse in the morning.  Occurs year-round Potential triggers: not sure Treatments tried:  None   Previous allergy testing: no History of reflux/heartburn: no History of sinus surgery: no Nonallergic triggers: none     Past Medical History: Past Medical History:  Diagnosis Date   Carpal tunnel syndrome    Gout    Hyperlipidemia    on meds   Hypertension    on meds   Past Surgical History: Past Surgical History:  Procedure Laterality Date   COLONOSCOPY  2015   DJ-MAC-polyps   LOOP RECORDER INSERTION N/A 02/19/2020   Procedure: LOOP RECORDER INSERTION;  Surgeon: Constance Haw, MD;  Location: [facility];  Service: Cardiovascular;  Laterality: N/A;   no prior surgery     POLYPECTOMY  2015   DJ-MAC-polyps    Family History: Family History  Problem Relation Age of Onset   Ovarian cancer Mother    Heart disease Father    Hypertension Father    Colon polyps Sister 53   Colon cancer Neg Hx    Esophageal cancer Neg Hx    Rectal cancer Neg Hx    Stomach cancer Neg Hx     Social History:  Lives in a 33 year house Flooring in bedroom: wood Pets: cat and dog Tobacco use/exposure: none Job: furniture restoration    Medication List:  Allergies as of 03/04/2022   No Known Allergies      Medication List        Accurate as of March 04, 2022 11:59 PM. If you have any questions, ask your nurse or doctor.          amLODipine 5 MG tablet Commonly known as: NORVASC Take 1 tablet (5 mg total) by mouth daily.   cholecalciferol 25 MCG (1000 UNIT) tablet Commonly known as: VITAMIN D3 Take 1 tablet (1,000 Units total) by mouth daily.   clopidogrel 75 MG tablet Commonly known as: PLAVIX TAKE 1 TABLET BY MOUTH DAILY   colchicine 0.6 MG tablet Take 1  tablet (0.6 mg total) by mouth 2 (two) times daily.   EPINEPHrine 0.3 mg/0.3 mL Soaj injection Commonly known as: EPI-PEN Inject 0.3 mg into the muscle as needed for anaphylaxis.   famotidine 20 MG tablet Commonly known as: Pepcid Take 1 tablet (20 mg total) by mouth 2 (two) times daily as needed (hives). Started by: Larose Kells, MD   fexofenadine 180 MG tablet Commonly known as: Allegra Allergy Take 1 tablet (180 mg total) by mouth 2 (two) times daily as needed (hives). Started by: Larose Kells, MD   losartan 100 MG tablet Commonly known as: COZAAR TAKE 1 TABLET BY MOUTH DAILY.   rosuvastatin 20 MG tablet Commonly known as: CRESTOR Take 1 tablet (20 mg total) by mouth daily.         REVIEW OF SYSTEMS: Pertinent positives and negatives discussed in HPI.   Objective:    Physical Exam: BP 138/76   Pulse 75   Temp 98.2 F (36.8 C) (Temporal)   Resp 14   Ht 5' 10"$  (1.778 m)   Wt 173 lb 6.4 oz (78.7 kg)   SpO2 97%   BMI 24.88 kg/m  Body mass index is 24.88 kg/m. GEN: alert, well developed HEENT: clear conjunctiva, TM grey and translucent, nose with + inferior turbinate hypertrophy, pink nasal mucosa, slight clear rhinorrhea, + cobblestoning, small vessel with evidence of bleeding on L septum HEART: regular rate and rhythm, no murmur LUNGS: clear to auscultation bilaterally, no coughing, unlabored respiration ABDOMEN: soft, non distended  SKIN: no rashes or lesions  Reviewed:  11/19/2021: seen by [name] NP for angioedema and went to the urgent care and was given prednisone and his symptoms improved. Had a muffin that day.  Then another day had muffin, coffee and peanut butter without any issues. Given Epipen and prednisone taper and referred to Allergy.   02/21/2020-03/04/2020: admitted for R hemiparesis and facial droop, seen chronic infarction   Skin Testing:  Skin prick testing was placed, which includes aeroallergens/foods, histamine control, and saline control.  Verbal consent was obtained prior to placing test.  Patient tolerated procedure well.  Allergy testing results were read and interpreted by myself, documented by clinical staff. Adequate positive and negative control.  Results discussed with patient/family.  Airborne Adult Perc - 03/04/22 1059     Time Antigen Placed 1059    Allergen Manufacturer Lavella Hammock    Location Back    Number of Test 59    Panel 1 Select    1. Control-Buffer 50% Glycerol Negative    2. Control-Histamine 1 mg/ml 3+    3. Albumin saline Negative    4. Hutto Negative    5. Guatemala Negative    6. Johnson Negative    7. Nitro Blue Negative    8. Meadow Fescue Negative    9. Perennial Rye Negative    10. Sweet Vernal Negative    11. Timothy Negative    12. Cocklebur Negative    13. Burweed Marshelder  Negative    14. Ragweed, short Negative    15. Ragweed, Giant Negative    16. Plantain,  English Negative    17. Lamb's Quarters Negative    18. Sheep Sorrell Negative    19. Rough Pigweed Negative    20. Marsh Elder, Rough Negative    21. Mugwort, Common Negative    22. Ash mix Negative    23. Birch mix Negative    24. Beech American Negative    25. Box, Elder Negative  Fletcher, red Negative    27. Cottonwood, [place] Negative    28. Elm mix Negative    29. Hickory Negative    30. Maple mix Negative    31. Oak, [place] mix Negative    32. Pecan Pollen Negative    33. Pine mix Negative    34. Sycamore Eastern Negative    35. Poyen, Black Pollen Negative    36. Alternaria alternata Negative    37. Cladosporium Herbarum Negative    38. Aspergillus mix 2+    39. Penicillium mix 2+    40. Bipolaris sorokiniana (Helminthosporium) Negative    41. Drechslera spicifera (Curvularia) Negative    42. Mucor plumbeus Negative    43. Fusarium moniliforme Negative    44. Aureobasidium pullulans (pullulara) Negative    45. Rhizopus oryzae Negative    46. Botrytis cinera Negative    47. Epicoccum nigrum Negative    48. Phoma betae Negative    49. Candida Albicans Negative    50. Trichophyton mentagrophytes Negative    51. Mite, D Farinae  5,000 AU/ml 3+    52. Mite, D Pteronyssinus  5,000 AU/ml 3+    53. Cat Hair 10,000 BAU/ml Negative    54.  Dog Epithelia Negative    55. Mixed Feathers Negative    56. Horse Epithelia Negative    57. Cockroach, German Negative    58. Mouse Negative    59. Tobacco Leaf Negative               Assessment:   1. Idiopathic urticaria   2. Epistaxis, recurrent   3. Perennial allergic rhinitis     Plan/Recommendations:  Idiopathic Urticaria/Angioedema (Hives/Swelling): - At this time etiology of hives and swelling is unknown. Hives can be caused by a variety of different triggers including illness/infection, exercise, pressure,  vibrations, extremes of temperature to name a few however majority of the time there is no identifiable trigger.  -If this persists, we will obtain labs to rule out inflammatory/autoimmune/alpha gal/mast cell causes. -Start Allegra (Fexofenadine) 151m daily.   -If this reoccurs, increase to Allegra 1859mtwice daily.   -If this still reoccurs, add Pepcid (Famotidine) 20109mwice daily and continue Allegra 180m61mice daily. -If still no improvement, increase to Allegra 360mg31mce daily and Pepcid 40mg 21me daily. -Okay to keep Epipen but only for laryngeal involvement with trouble breathing and trouble swallowing.   Allergic Rhinitis: - Morning symptoms with turbinate hypertrophy and epistaxis concerning for allergic trigger especially dust mite so skin test was performed.  - Positive skin test 02/2022: dust mite and mold - Avoidance measures discussed. - Use nasal saline rinses before nose sprays such as with Neilmed Sinus Rinse.  Use distilled water.   - Use Allegra 180mg d72m.   - Consider allergy shots as long term control of your symptoms by teaching your immune system to be more tolerant of your allergy triggers - Will hold off on nose sprays in setting of nosebleeds.   Epistaxis -Pinch both nostrils while leaning forward for at least 5 minutes before checking to see if the bleeding has stopped. If bleeding is not controlled within 5-10 minutes apply a cotton ball soaked with oxymetazoline (Afrin) to the bleeding nostril for a few seconds.  -If the problem persists or worsens a referral to ENT for further evaluation may be necessary.    Return in about 6 weeks (around 04/15/2022).  Dorothye Berni [facility]

## 2022-03-15 ENCOUNTER — Other Ambulatory Visit: Payer: Self-pay

## 2022-03-15 ENCOUNTER — Encounter (HOSPITAL_COMMUNITY): Payer: Self-pay

## 2022-03-15 ENCOUNTER — Other Ambulatory Visit (HOSPITAL_COMMUNITY): Payer: Self-pay

## 2022-03-16 ENCOUNTER — Other Ambulatory Visit: Payer: Self-pay

## 2022-03-17 ENCOUNTER — Other Ambulatory Visit: Payer: Self-pay

## 2022-03-25 ENCOUNTER — Other Ambulatory Visit (HOSPITAL_COMMUNITY): Payer: Self-pay

## 2022-03-25 ENCOUNTER — Ambulatory Visit: Payer: Commercial Managed Care - PPO

## 2022-03-25 DIAGNOSIS — I639 Cerebral infarction, unspecified: Secondary | ICD-10-CM | POA: Diagnosis not present

## 2022-03-25 LAB — CUP PACEART REMOTE DEVICE CHECK
Date Time Interrogation Session: 20240308000730
Implantable Pulse Generator Implant Date: 20220202

## 2022-04-06 NOTE — Progress Notes (Signed)
[facility] Summary Report / Loop Recorder 

## 2022-04-07 ENCOUNTER — Encounter: Payer: Self-pay | Admitting: Pharmacist

## 2022-04-07 ENCOUNTER — Other Ambulatory Visit: Payer: Self-pay

## 2022-04-07 ENCOUNTER — Other Ambulatory Visit (HOSPITAL_COMMUNITY): Payer: Self-pay

## 2022-04-07 ENCOUNTER — Ambulatory Visit (INDEPENDENT_AMBULATORY_CARE_PROVIDER_SITE_OTHER): Payer: Commercial Managed Care - PPO

## 2022-04-07 ENCOUNTER — Ambulatory Visit: Payer: Commercial Managed Care - PPO | Admitting: Adult Health

## 2022-04-07 ENCOUNTER — Encounter: Payer: Self-pay | Admitting: Adult Health

## 2022-04-07 VITALS — BP 132/82 | HR 70 | Temp 97.5°F | Ht 70.0 in | Wt 175.0 lb

## 2022-04-07 DIAGNOSIS — M79672 Pain in left foot: Secondary | ICD-10-CM

## 2022-04-07 DIAGNOSIS — M545 Low back pain, unspecified: Secondary | ICD-10-CM | POA: Diagnosis not present

## 2022-04-07 MED ORDER — METHYLPREDNISOLONE 4 MG PO TBPK
ORAL_TABLET | ORAL | 0 refills | Status: DC
  Filled 2022-04-07 (×2): qty 21, 6d supply, fill #0

## 2022-04-07 NOTE — Progress Notes (Signed)
Subjective:    Patient ID: Brent Aloe Sr., male    DOB: [birth year], 72 y.o.   MRN: [id number]  HPI  72 year old male who  has a past medical history of Carpal tunnel syndrome, Gout, Hyperlipidemia, and Hypertension.  He presents to the office today for 2 separate acute issues.  First issue is that of left heel pain that has been present for 2 weeks.  Pain seems to be worse when his walking and towards the end of the day.  He has not noticed any swelling, redness, or warmth.  Additionally, yesterday he believes he injured his back lifting his dog out of the car to go to the vet.  Pain is in the right lower side.  Worse with bending and twisting motions.  Denies pain that radiates down the outside of his right leg.  Review of Systems See HPI   Past Medical History:  Diagnosis Date   Carpal tunnel syndrome    Gout    Hyperlipidemia    on meds   Hypertension    on meds    Social History   Socioeconomic History   Marital status: Married    Spouse name: Not on file   Number of children: Not on file   Years of education: Not on file   Highest education level: Not on file  Occupational History   Not on file  Tobacco Use   Smoking status: Former    Types: Cigarettes    Quit date: 01/17/1966    Years since quitting: 56.2    Passive exposure: Never   Smokeless tobacco: Never  Vaping Use   Vaping Use: Never used  Substance and Sexual Activity   Alcohol use: Yes    Alcohol/week: 2.0 standard drinks of alcohol    Types: 2 Cans of beer per week    Comment: occassionally   Drug use: No   Sexual activity: Yes  Other Topics Concern   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    Married    Two children, live locally.       Three dogs and two cats    Social Determinants of Health   Financial Resource Strain: Not on file  Food Insecurity: Not on file  Transportation Needs: Not on file   Physical Activity: Not on file  Stress: Not on file  Social Connections: Not on file  Intimate Partner Violence: Not on file    Past Surgical History:  Procedure Laterality Date   COLONOSCOPY  2015   DJ-MAC-polyps   LOOP RECORDER INSERTION N/A 02/19/2020   Procedure: [place];  Surgeon: Constance Haw, MD;  Location: [facility];  Service: Cardiovascular;  Laterality: N/A;   no prior surgery     POLYPECTOMY  2015   DJ-MAC-polyps    Family History  Problem Relation Age of Onset   Ovarian cancer Mother    Heart disease Father    Hypertension Father    Colon polyps Sister 49   Colon cancer Neg Hx    Esophageal cancer Neg Hx    Rectal cancer Neg Hx    Stomach cancer Neg Hx     Allergies  Allergen Reactions   Dust Mite Extract    Molds & Smuts     Current Outpatient Medications on File Prior to Visit  Medication Sig Dispense Refill   amLODipine (NORVASC) 5  MG tablet Take 1 tablet (5 mg total) by mouth daily. 90 tablet 0   cholecalciferol (VITAMIN D3) 25 MCG (1000 UNIT) tablet Take 1 tablet (1,000 Units total) by mouth daily. 30 tablet 0   clopidogrel (PLAVIX) 75 MG tablet TAKE 1 TABLET BY MOUTH DAILY 90 tablet 3   colchicine 0.6 MG tablet Take 1 tablet (0.6 mg total) by mouth 2 (two) times daily. 180 tablet 1   EPINEPHrine 0.3 mg/0.3 mL IJ SOAJ injection Inject 0.3 mg into the muscle as needed for anaphylaxis. 2 each 1   famotidine (PEPCID) 20 MG tablet Take 1 tablet (20 mg total) by mouth 2 (two) times daily as needed (hives). 60 tablet 5   fexofenadine (ALLEGRA ALLERGY) 180 MG tablet Take 1 tablet (180 mg total) by mouth 2 (two) times daily as needed (hives). 60 tablet 5   losartan (COZAAR) 100 MG tablet TAKE 1 TABLET BY MOUTH DAILY. 90 tablet 3   rosuvastatin (CRESTOR) 20 MG tablet Take 1 tablet (20 mg total) by mouth daily. 90 tablet 1   No current facility-administered medications on file prior to visit.    BP (!) 160/100   Pulse 70    Temp (!) 97.5 F (36.4 C) (Oral)   Ht 5\' 10"  (1.778 m)   Wt 175 lb (79.4 kg)   SpO2 100%   BMI 25.11 kg/m       Objective:   Physical Exam Vitals and nursing note reviewed.  Constitutional:      Appearance: Normal appearance.  Musculoskeletal:       Back:       Feet:  Feet:     Left foot:     Skin integrity: Skin integrity normal.     Toenail Condition: Left toenails are normal.  Skin:    General: Skin is warm and dry.  Neurological:     General: No focal deficit present.     Mental Status: He is alert and oriented to person, place, and time.  Psychiatric:        Mood and Affect: Mood normal.        Behavior: Behavior normal.        Thought Content: Thought content normal.        Assessment & Plan:  1. Pain of left heel - Likely bone spur we will get x-ray of the left foot today.  He has had a bone spur on his right heel in the past and responded well to Medrol Dosepak. - DG Foot Complete Left; Future - methylPREDNISolone (MEDROL DOSEPAK) 4 MG TBPK tablet; Take as directed  Dispense: 21 tablet; Refill: 0  2. Acute right-sided low back pain without sciatica -likely muscular.  Medrol Dosepak will help with this as well. - methylPREDNISolone (MEDROL DOSEPAK) 4 MG TBPK tablet; Take as directed  Dispense: 21 tablet; Refill: 0  Dorothyann Peng, NP

## 2022-04-15 ENCOUNTER — Other Ambulatory Visit (HOSPITAL_COMMUNITY): Payer: Self-pay

## 2022-04-15 ENCOUNTER — Other Ambulatory Visit: Payer: Self-pay | Admitting: Adult Health

## 2022-04-18 ENCOUNTER — Other Ambulatory Visit: Payer: Self-pay

## 2022-04-18 MED ORDER — CLOPIDOGREL BISULFATE 75 MG PO TABS
75.0000 mg | ORAL_TABLET | Freq: Every day | ORAL | 3 refills | Status: DC
  Filled 2022-04-18: qty 90, 90d supply, fill #0
  Filled 2022-07-15: qty 90, 90d supply, fill #1
  Filled 2022-10-14: qty 90, 90d supply, fill #2
  Filled 2023-01-12: qty 90, 90d supply, fill #3

## 2022-04-20 NOTE — Progress Notes (Unsigned)
    Brent Mccormick D.Brent Mccormick Phone: [phone]   Assessment and Plan:     There are no diagnoses linked to this encounter.  ***   Pertinent previous records reviewed include ***   Follow Up: ***     Subjective:   I, Brent Mccormick, am serving as a Education administrator for Brent Mccormick  Chief Complaint: back pain   HPI:   04/21/2022 Patient is a 72 year old male complaining of back pain. Patient states  Relevant Historical Information: ***  Additional pertinent review of systems negative.   Current Outpatient Medications:    amLODipine (NORVASC) 5 MG tablet, Take 1 tablet (5 mg total) by mouth daily., Disp: 90 tablet, Rfl: 0   cholecalciferol (VITAMIN D3) 25 MCG (1000 UNIT) tablet, Take 1 tablet (1,000 Units total) by mouth daily., Disp: 30 tablet, Rfl: 0   clopidogrel (PLAVIX) 75 MG tablet, Take 1 tablet (75 mg total) by mouth daily., Disp: 90 tablet, Rfl: 3   colchicine 0.6 MG tablet, Take 1 tablet (0.6 mg total) by mouth 2 (two) times daily., Disp: 180 tablet, Rfl: 1   EPINEPHrine 0.3 mg/0.3 mL IJ SOAJ injection, Inject 0.3 mg into the muscle as needed for anaphylaxis., Disp: 2 each, Rfl: 1   famotidine (PEPCID) 20 MG tablet, Take 1 tablet (20 mg total) by mouth 2 (two) times daily as needed (hives)., Disp: 60 tablet, Rfl: 5   fexofenadine (ALLEGRA ALLERGY) 180 MG tablet, Take 1 tablet (180 mg total) by mouth 2 (two) times daily as needed (hives)., Disp: 60 tablet, Rfl: 5   losartan (COZAAR) 100 MG tablet, TAKE 1 TABLET BY MOUTH DAILY., Disp: 90 tablet, Rfl: 3   methylPREDNISolone (MEDROL DOSEPAK) 4 MG TBPK tablet, Take as directed, Disp: 21 tablet, Rfl: 0   rosuvastatin (CRESTOR) 20 MG tablet, Take 1 tablet (20 mg total) by mouth daily., Disp: 90 tablet, Rfl: 1   Objective:     There were no vitals filed for this visit.    There is no height or weight on file to calculate BMI.    Physical Exam:     ***   Electronically signed by:  Brent Mccormick D.[facility] 7:18 AM 04/20/22

## 2022-04-21 ENCOUNTER — Ambulatory Visit: Payer: Commercial Managed Care - PPO | Admitting: Sports Medicine

## 2022-04-21 ENCOUNTER — Ambulatory Visit (INDEPENDENT_AMBULATORY_CARE_PROVIDER_SITE_OTHER): Payer: Commercial Managed Care - PPO

## 2022-04-21 ENCOUNTER — Other Ambulatory Visit (HOSPITAL_COMMUNITY): Payer: Self-pay

## 2022-04-21 VITALS — BP 140/80 | HR 63 | Ht 70.0 in | Wt 169.0 lb

## 2022-04-21 DIAGNOSIS — G8929 Other chronic pain: Secondary | ICD-10-CM | POA: Diagnosis not present

## 2022-04-21 DIAGNOSIS — M109 Gout, unspecified: Secondary | ICD-10-CM

## 2022-04-21 DIAGNOSIS — M545 Low back pain, unspecified: Secondary | ICD-10-CM | POA: Diagnosis not present

## 2022-04-21 DIAGNOSIS — M5441 Lumbago with sciatica, right side: Secondary | ICD-10-CM | POA: Diagnosis not present

## 2022-04-21 DIAGNOSIS — M5136 Other intervertebral disc degeneration, lumbar region: Secondary | ICD-10-CM | POA: Diagnosis not present

## 2022-04-21 DIAGNOSIS — M5116 Intervertebral disc disorders with radiculopathy, lumbar region: Secondary | ICD-10-CM

## 2022-04-21 MED ORDER — MELOXICAM 15 MG PO TABS
15.0000 mg | ORAL_TABLET | Freq: Every day | ORAL | 0 refills | Status: DC
  Filled 2022-04-21: qty 30, 30d supply, fill #0

## 2022-04-21 NOTE — Patient Instructions (Addendum)
Good to see you  - Start meloxicam 15 mg daily x2 weeks.  If still having pain after 2 weeks, complete 3rd-week of meloxicam. May use remaining meloxicam as needed once daily for pain control.  Do not to use additional NSAIDs while taking meloxicam.  May use Tylenol [phone] mg 2 to 3 times a day for breakthrough pain. Low back and glute HEP  3-4 week follow up    Gout - I would recommend discussing gout medication with your primary care physician to see if medication is necessary - I do not see any recent elevated uric acid levels, and on chart review I only see 2 very mild elevations in uric acid in 2018 and 2013.  No crystal analysis on physical exam.  Based on this review, I am not sure if gout is a true diagnosis - Patient has been taking colchicine daily.  Would recommend discussing with PCP if this is a necessary medication to continue taking.  Hopefully this medication could be discontinued - Could discuss the possibility of checking a uric acid level when patient is not in acute flare and if this level is normal, gout could be safely ruled out as a diagnosis.  If it is elevated, could consider starting allopurinol.

## 2022-04-22 ENCOUNTER — Ambulatory Visit: Payer: Medicare Other | Admitting: Internal Medicine

## 2022-04-22 NOTE — Progress Notes (Signed)
[facility] Summary Report / Loop Recorder 

## 2022-04-25 ENCOUNTER — Telehealth: Payer: Self-pay | Admitting: Sports Medicine

## 2022-04-25 NOTE — Telephone Encounter (Signed)
Patient called stating that he has been taking Meloxicam since last Thursday and has not had any improvement. Please advise.

## 2022-04-26 NOTE — Telephone Encounter (Signed)
Spoke to patient and gave Dr Edison Pace recommendations. He expressed understanding and will contact us to schedule. He also mentioned that the pain was mostly in is back and buttocks area when he was here but it is now more in his back.

## 2022-04-27 ENCOUNTER — Ambulatory Visit (INDEPENDENT_AMBULATORY_CARE_PROVIDER_SITE_OTHER): Payer: Commercial Managed Care - PPO

## 2022-04-27 DIAGNOSIS — I639 Cerebral infarction, unspecified: Secondary | ICD-10-CM | POA: Diagnosis not present

## 2022-04-27 LAB — CUP PACEART REMOTE DEVICE CHECK
Date Time Interrogation Session: 20240410000517
Implantable Pulse Generator Implant Date: 20220202

## 2022-05-06 ENCOUNTER — Ambulatory Visit: Payer: Medicare Other | Admitting: Internal Medicine

## 2022-05-11 NOTE — Progress Notes (Signed)
    Brent Mccormick [facility] [address] [place] [address] Phone: [phone]   Assessment and Plan:     There are no diagnoses linked to this encounter.  ***   Pertinent previous records reviewed include ***   Follow Up: ***     Subjective:   I, Brent Mccormick, am serving as a Neurosurgeon for Doctor Richardean Sale   Chief Complaint: back pain    HPI:    04/21/2022 Patient is a 72 year old male complaining of back pain. Patient states he believes he injured his back lifting his dog out of the car to go to the vet 3 weeks ago. Pain is in the right lower side. Worse with bending and twisting motions. Pain radiates down his whole leg , tylenol for the pain and that does not seem to help, no numbness just a little tingling, when he moves suddenly gets a sharp pain down his leg , painful to stand , he is on his feet a lot for work, rest doesn't help or walking it off    05/12/2022 Patient states    Relevant Historical Information: Hypertension, chronic hep C  Additional pertinent review of systems negative.   Current Outpatient Medications:    amLODipine (NORVASC) 5 MG tablet, Take 1 tablet (5 mg total) by mouth daily., Disp: 90 tablet, Rfl: 0   cholecalciferol (VITAMIN D3) 25 MCG (1000 UNIT) tablet, Take 1 tablet (1,000 Units total) by mouth daily., Disp: 30 tablet, Rfl: 0   clopidogrel (PLAVIX) 75 MG tablet, Take 1 tablet (75 mg total) by mouth daily., Disp: 90 tablet, Rfl: 3   colchicine 0.6 MG tablet, Take 1 tablet (0.6 mg total) by mouth 2 (two) times daily., Disp: 180 tablet, Rfl: 1   EPINEPHrine 0.3 mg/0.3 mL IJ SOAJ injection, Inject 0.3 mg into the muscle as needed for anaphylaxis., Disp: 2 each, Rfl: 1   famotidine (PEPCID) 20 MG tablet, Take 1 tablet (20 mg total) by mouth 2 (two) times daily as needed (hives)., Disp: 60 tablet, Rfl: 5   fexofenadine (ALLEGRA ALLERGY) 180 MG tablet, Take 1 tablet (180 mg total) by mouth 2 (two)  times daily as needed (hives)., Disp: 60 tablet, Rfl: 5   losartan (COZAAR) 100 MG tablet, TAKE 1 TABLET BY MOUTH DAILY., Disp: 90 tablet, Rfl: 3   meloxicam (MOBIC) 15 MG tablet, Take 1 tablet (15 mg total) by mouth daily., Disp: 30 tablet, Rfl: 0   methylPREDNISolone (MEDROL DOSEPAK) 4 MG TBPK tablet, Take as directed, Disp: 21 tablet, Rfl: 0   rosuvastatin (CRESTOR) 20 MG tablet, Take 1 tablet (20 mg total) by mouth daily., Disp: 90 tablet, Rfl: 1   Objective:     There were no vitals filed for this visit.    There is no height or weight on file to calculate BMI.    Physical Exam:    ***   Electronically signed by:  Brent Mccormick [facility] 12:09 PM 05/11/22

## 2022-05-12 ENCOUNTER — Ambulatory Visit: Payer: Commercial Managed Care - PPO | Admitting: Sports Medicine

## 2022-05-12 VITALS — BP 132/80 | HR 87 | Ht 70.0 in | Wt 171.0 lb

## 2022-05-12 DIAGNOSIS — M5441 Lumbago with sciatica, right side: Secondary | ICD-10-CM

## 2022-05-12 DIAGNOSIS — G8929 Other chronic pain: Secondary | ICD-10-CM | POA: Diagnosis not present

## 2022-05-12 DIAGNOSIS — M5116 Intervertebral disc disorders with radiculopathy, lumbar region: Secondary | ICD-10-CM

## 2022-05-12 NOTE — Addendum Note (Signed)
Addended by: Debbe Odea R on: 05/12/2022 11:10 AM   Modules accepted: Orders

## 2022-05-12 NOTE — Patient Instructions (Addendum)
Good to see you  MRI lumbar  Discontinue meloxicam  Tylenol [phone] mg 2-3 times a day for pain relief  Continue HEP  Follow up 3 days after MRI  to discuss results

## 2022-05-20 ENCOUNTER — Other Ambulatory Visit (HOSPITAL_COMMUNITY): Payer: Self-pay

## 2022-05-20 ENCOUNTER — Other Ambulatory Visit: Payer: Self-pay

## 2022-05-20 ENCOUNTER — Other Ambulatory Visit: Payer: Self-pay | Admitting: Adult Health

## 2022-05-20 DIAGNOSIS — I10 Essential (primary) hypertension: Secondary | ICD-10-CM

## 2022-05-20 MED ORDER — LOSARTAN POTASSIUM 100 MG PO TABS
100.0000 mg | ORAL_TABLET | Freq: Every day | ORAL | 3 refills | Status: DC
  Filled 2022-05-20: qty 90, 90d supply, fill #0
  Filled 2022-08-12: qty 90, 90d supply, fill #1
  Filled 2022-11-17: qty 90, 90d supply, fill #2
  Filled 2023-02-09: qty 90, 90d supply, fill #3

## 2022-05-20 MED ORDER — AMLODIPINE BESYLATE 5 MG PO TABS
5.0000 mg | ORAL_TABLET | Freq: Every day | ORAL | 0 refills | Status: DC
  Filled 2022-05-20: qty 90, 90d supply, fill #0

## 2022-05-24 IMAGING — MR MR PROSTATE WO/W CM
12 series · 48 of 48 positions shown · IV contrast (multihance)
Comparison: None.

CLINICAL DATA: Elevated PSA.

EXAM:
MR PROSTATE WITHOUT AND WITH CONTRAST
TECHNIQUE: Multiplanar multisequence MRI images were obtained of the pelvis
centered about the prostate. Pre and post contrast images were
obtained.
CONTRAST:  15mL MULTIHANCE GADOBENATE DIMEGLUMINE 529 MG/ML IV SOLN

[Series 3: T2 · coronal · 3.0mm · 0.56mm/px · 1 of 23 slices shown (1 of 3)]
[im 1/23]
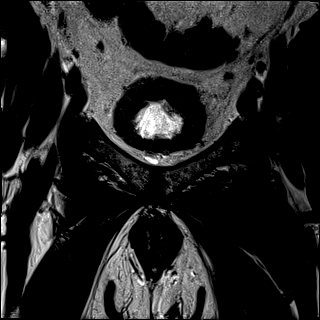

[Series 4: T1 · axial · 5.0mm · 1.25mm/px · 1 of 80 slices shown]
[im 1/80]
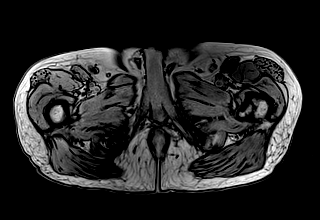

[Series 5: DWI · axial · 3.0mm · 1.75mm/px · z∈[+94,+166]mm · 2 of 75 slices shown (1 of 3)]
[im 1/75]
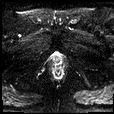
[im 75/75]
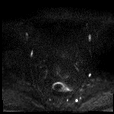

[Series 6: DWI · axial · 3.0mm · 1.75mm/px · 1 of 25 slices shown (2 of 3)]
[im 1/25]
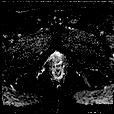

[Series 7: DWI · axial · 3.0mm · 1.75mm/px · 1 of 25 slices shown (3 of 3)]
[im 1/25]
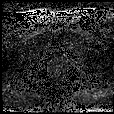

[Series 8: T2 · axial · 3.0mm · 0.56mm/px · 1 of 25 slices shown (2 of 3)]
[im 1/25]
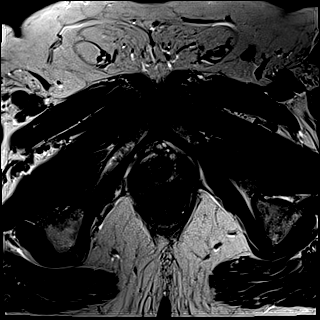

[Series 9: T2 · axial · 1.0mm · 1.04mm/px · z∈[+95,+166]mm · 2 of 72 slices shown (3 of 3)]
[im 1/72]
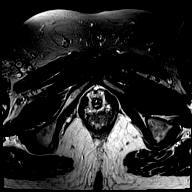
[im 72/72]
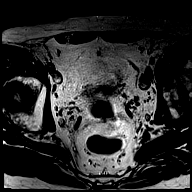

[Series 10: pre t1_twist_tra_dyn · axial · non-contrast · 3.5mm · 0.83mm/px · 1 of 20 slices shown]
[im 1/20]
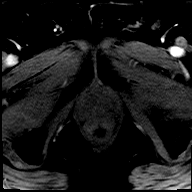

[Series 11: post t1_twist_tra_dyn-copy center · axial · non-contrast · 3.5mm · 0.83mm/px · z∈[+97,+163]mm · 17 of 600 slices shown]
[im 1/600]
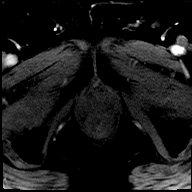
[im 38/600]
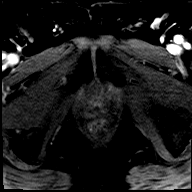
[im 75/600]
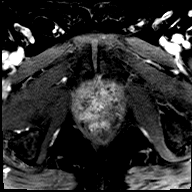
[im 113/600]
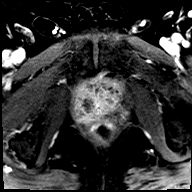
[im 150/600]
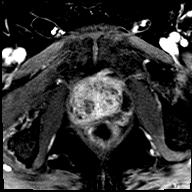
[im 188/600]
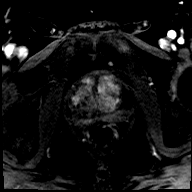
[im 225/600]
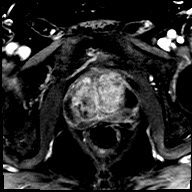
[im 263/600]
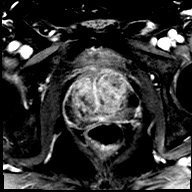
[im 300/600]
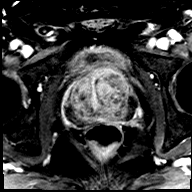
[im 337/600]
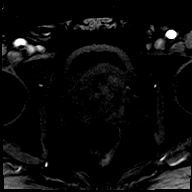
[im 375/600]
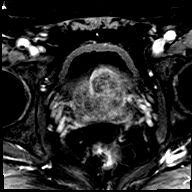
[im 412/600]
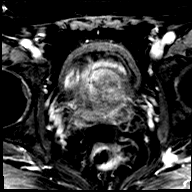
[im 450/600]
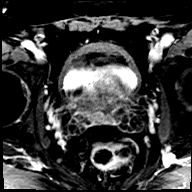
[im 487/600]
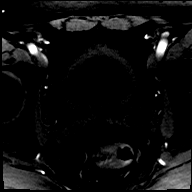
[im 525/600]
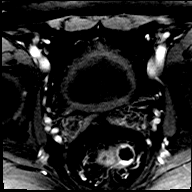
[im 562/600]
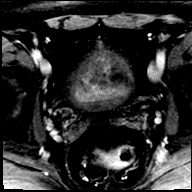
[im 600/600]
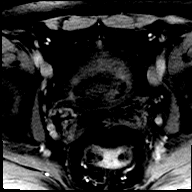

[Series 12: post t1_twist_tra_dyn-copy cent_sub · axial · 3.5mm · 0.83mm/px · z∈[+97,+163]mm · 17 of 580 slices shown]
[im 1/580]
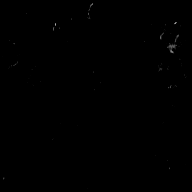
[im 37/580]
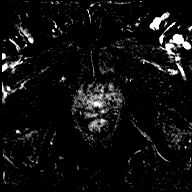
[im 73/580]
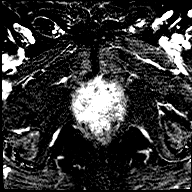
[im 109/580]
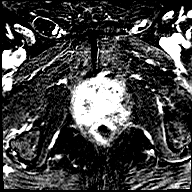
[im 145/580]
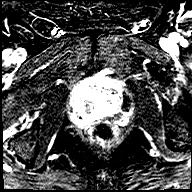
[im 181/580]
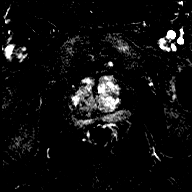
[im 218/580]
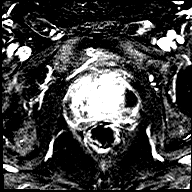
[im 254/580]
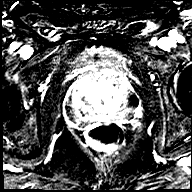
[im 290/580]
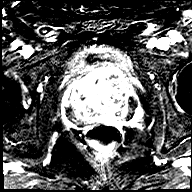
[im 326/580]
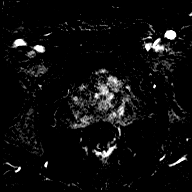
[im 362/580]
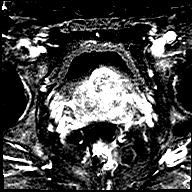
[im 399/580]
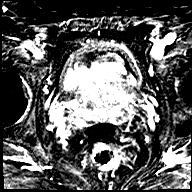
[im 435/580]
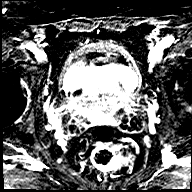
[im 471/580]
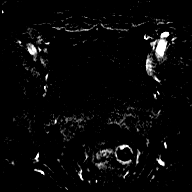
[im 507/580]
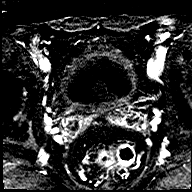
[im 543/580]
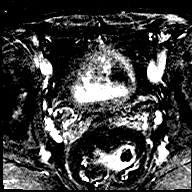
[im 580/580]
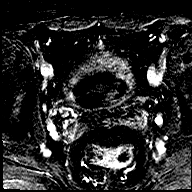

[Series 13: t1_vibe_dixon_tra_f · axial · 2.5mm · 0.91mm/px · z∈[+71,+268]mm · 2 of 80 slices shown]
[im 1/80]
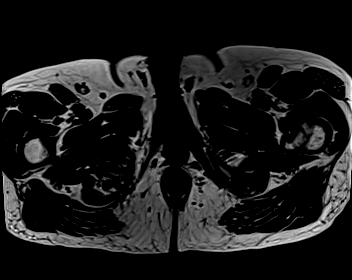
[im 80/80]
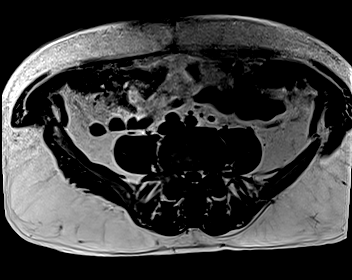

[Series 14: t1_vibe_dixon_tra_w · axial · 2.5mm · 0.91mm/px · z∈[+71,+268]mm · 2 of 80 slices shown]
[im 1/80]
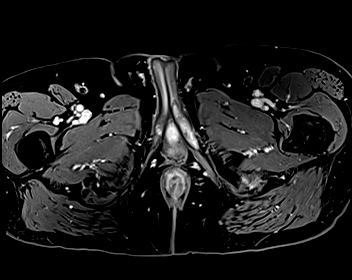
[im 80/80]
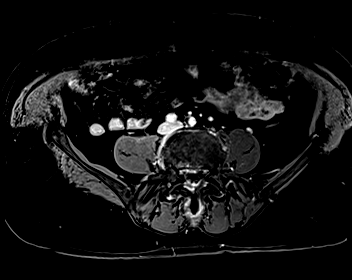

[48 of 48 positions shown; findings below may reference images not displayed]

FINDINGS: Prostate:

-- Peripheral Zone: Asymmetric thinning is seen in the mid gland and
apex, right side greater than left, consistent with scarring from
chronic prostatitis. Linear/wedge shaped hypointensities are noted
on ADC, right side greater than left; however, no focal ADC
hypointense or high b-value DWI hyperintense nodules are identified.

-- Transition/Central Zone: Moderately enlarged with diffuse
involvement by BPH nodules. No nodules with suspicious
characteristics on T2-weighted imaging.

-- Measurements/Volume:  5.1 by 4.5 x 6.4 cm (volume = 77 cm^3)

Transcapsular spread:  Absent

Seminal vesicle involvement:  Absent

Neurovascular bundle involvement:  Absent

Pelvic adenopathy: None visualized

Bone metastasis: None visualized

Other:  Sigmoid diverticulosis, without evidence of diverticulitis.
IMPRESSION: No radiographic evidence of high-grade prostate carcinoma. PI-RADS 2
(v.2.1): Low (clinically significant cancer unlikely)

Findings consistent with chronic prostatitis.

## 2022-05-30 ENCOUNTER — Ambulatory Visit (INDEPENDENT_AMBULATORY_CARE_PROVIDER_SITE_OTHER): Payer: Commercial Managed Care - PPO

## 2022-05-30 DIAGNOSIS — I639 Cerebral infarction, unspecified: Secondary | ICD-10-CM | POA: Diagnosis not present

## 2022-05-30 LAB — CUP PACEART REMOTE DEVICE CHECK
Date Time Interrogation Session: 20240513000255
Implantable Pulse Generator Implant Date: 20220202

## 2022-06-03 NOTE — Progress Notes (Signed)
[facility] Summary Report / Loop Recorder 

## 2022-06-17 ENCOUNTER — Other Ambulatory Visit (HOSPITAL_COMMUNITY): Payer: Self-pay

## 2022-06-17 ENCOUNTER — Other Ambulatory Visit: Payer: Self-pay

## 2022-06-17 ENCOUNTER — Other Ambulatory Visit: Payer: Self-pay | Admitting: Adult Health

## 2022-06-21 ENCOUNTER — Other Ambulatory Visit: Payer: Self-pay

## 2022-06-21 ENCOUNTER — Other Ambulatory Visit (HOSPITAL_COMMUNITY): Payer: Self-pay

## 2022-06-21 MED ORDER — ROSUVASTATIN CALCIUM 20 MG PO TABS
20.0000 mg | ORAL_TABLET | Freq: Every day | ORAL | 1 refills | Status: DC
  Filled 2022-06-21: qty 90, 90d supply, fill #0
  Filled 2022-09-27: qty 90, 90d supply, fill #1

## 2022-06-23 NOTE — Progress Notes (Signed)
[facility] Summary Report / Loop Recorder 

## 2022-07-04 ENCOUNTER — Ambulatory Visit (INDEPENDENT_AMBULATORY_CARE_PROVIDER_SITE_OTHER): Payer: Commercial Managed Care - PPO

## 2022-07-04 DIAGNOSIS — I639 Cerebral infarction, unspecified: Secondary | ICD-10-CM

## 2022-07-04 LAB — CUP PACEART REMOTE DEVICE CHECK
Date Time Interrogation Session: 20240617000451
Implantable Pulse Generator Implant Date: 20220202

## 2022-07-07 NOTE — Progress Notes (Signed)
[facility] [address] [place] [address] Phone: [phone] Subjective:   Bruce Donath, am serving as a scribe for Dr. Antoine Primas.  I'm seeing this patient by the request  of:  Nafziger, Kandee Keen, NP  CC: Low back pain  [id number]  Brent Hudspeth Sr. is a 72 y.o. male coming in with complaint of LBP and R side L pain. Patient states that his back pain has improved since it began 2 months ago but he has nerve pain that radiates from R glute down to the R calf that persists. Picked up something awkwardly. Pain worsens with standing or sitting for prolonged periods. Sometimes radiates into the foot. Notes leg getting weak when pain is bad. Has tired stretches, icing, and rest. Has seen chiro which was helpful. Dr. Jean Rosenthal ordered MRI but patient has not had it yet.    Xray lumbar 04/21/22 IMPRESSION: Multilevel degenerative changes of the lumbar spine most pronounced at L2-3  Past Medical History:  Diagnosis Date   Carpal tunnel syndrome    Gout    Hyperlipidemia    on meds   Hypertension    on meds   Past Surgical History:  Procedure Laterality Date   COLONOSCOPY  2015   DJ-MAC-polyps   LOOP RECORDER INSERTION N/A 02/19/2020   Procedure: LOOP RECORDER INSERTION;  Surgeon: Regan Lemming, MD;  Location: [facility];  Service: Cardiovascular;  Laterality: N/A;   no prior surgery     POLYPECTOMY  2015   DJ-MAC-polyps   Social History   Socioeconomic History   Marital status: Married    Spouse name: Not on file   Number of children: Not on file   Years of education: Not on file   Highest education level: Not on file  Occupational History   Not on file  Tobacco Use   Smoking status: Former    Types: Cigarettes    Quit date: 01/17/1966    Years since quitting: 56.5    Passive exposure: Never   Smokeless tobacco: Never  Vaping Use   Vaping Use: Never used  Substance and Sexual Activity   Alcohol use: Yes     Alcohol/week: 2.0 standard drinks of alcohol    Types: 2 Cans of beer per week    Comment: occassionally   Drug use: No   Sexual activity: Yes  Other Topics Concern   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    Married    Two children, live locally.       Three dogs and two cats    Social Determinants of Health   Financial Resource Strain: Not on file  Food Insecurity: Not on file  Transportation Needs: Not on file  Physical Activity: Not on file  Stress: Not on file  Social Connections: Not on file   Allergies  Allergen Reactions   Dust Mite Extract    Molds & Smuts    Family History  Problem Relation Age of Onset   Ovarian cancer Mother    Heart disease Father    Hypertension Father    Colon polyps Sister 77   Colon cancer Neg Hx    Esophageal cancer Neg Hx    Rectal cancer Neg Hx    Stomach cancer Neg Hx     Current Outpatient Medications (Endocrine & Metabolic):    methylPREDNISolone (MEDROL DOSEPAK) 4  MG TBPK tablet, Take as directed  Current Outpatient Medications (Cardiovascular):    amLODipine (NORVASC) 5 MG tablet, Take 1 tablet (5 mg total) by mouth daily.   EPINEPHrine 0.3 mg/0.3 mL IJ SOAJ injection, Inject 0.3 mg into the muscle as needed for anaphylaxis.   losartan (COZAAR) 100 MG tablet, Take 1 tablet (100 mg total) by mouth daily.   rosuvastatin (CRESTOR) 20 MG tablet, Take 1 tablet (20 mg total) by mouth daily.  Current Outpatient Medications (Respiratory):    fexofenadine (ALLEGRA ALLERGY) 180 MG tablet, Take 1 tablet (180 mg total) by mouth 2 (two) times daily as needed (hives).  Current Outpatient Medications (Analgesics):    colchicine 0.6 MG tablet, Take 1 tablet (0.6 mg total) by mouth 2 (two) times daily.   meloxicam (MOBIC) 15 MG tablet, Take 1 tablet (15 mg total) by mouth daily.   traMADol (ULTRAM) 50 MG tablet, Take 1 tablet (50 mg) by mouth every 8  hours as needed  Current Outpatient Medications (Hematological):    clopidogrel (PLAVIX) 75 MG tablet, Take 1 tablet (75 mg total) by mouth daily.  Current Outpatient Medications (Other):    cholecalciferol (VITAMIN D3) 25 MCG (1000 UNIT) tablet, Take 1 tablet (1,000 Units total) by mouth daily.   ciprofloxacin (CIPRO) 500 MG tablet, Take 1 tablet (500 mg) by mouth 2 times daily for 10 days.   famotidine (PEPCID) 20 MG tablet, Take 1 tablet (20 mg total) by mouth 2 (two) times daily as needed (hives).   Reviewed prior external information including notes and imaging from  primary care provider As well as notes that were available from care everywhere and other healthcare systems.  Past medical history, social, surgical and family history all reviewed in electronic medical record.  No pertanent information unless stated regarding to the chief complaint.   Review of Systems:  No headache, visual changes, nausea, vomiting, diarrhea, constipation, dizziness, abdominal pain, skin rash, fevers, chills, night sweats, weight loss, swollen lymph nodes, joint swelling, chest pain, shortness of breath, mood changes. POSITIVE muscle aches, body aches  Objective  Blood pressure 122/78, pulse 69, height 5\' 10"  (1.778 m), SpO2 98 %.   General: No apparent distress alert and oriented x3 mood and affect normal, dressed appropriately.  HEENT: Pupils equal, extraocular movements intact  Respiratory: Patient's speak in full sentences and does not appear short of breath  Cardiovascular: No lower extremity edema, non tender, no erythema  Mild shuffling gait noted.  Patient does have a positive straight leg test noted at 25 degrees on the right side.  Patient has significant weakness with 3+ dorsiflexion on the right compared to full strength on the left.  Patient also has a 1+ DTR on the right compared to the left.    Impression and Recommendations:     The above documentation has been reviewed and is  accurate and complete Judi Saa, DO

## 2022-07-13 ENCOUNTER — Encounter: Payer: Self-pay | Admitting: Family Medicine

## 2022-07-13 ENCOUNTER — Other Ambulatory Visit (HOSPITAL_COMMUNITY): Payer: Self-pay

## 2022-07-13 ENCOUNTER — Ambulatory Visit: Payer: Commercial Managed Care - PPO | Admitting: Family Medicine

## 2022-07-13 ENCOUNTER — Other Ambulatory Visit: Payer: Self-pay

## 2022-07-13 VITALS — BP 122/78 | HR 69 | Ht 70.0 in

## 2022-07-13 DIAGNOSIS — M2550 Pain in unspecified joint: Secondary | ICD-10-CM | POA: Diagnosis not present

## 2022-07-13 DIAGNOSIS — M5116 Intervertebral disc disorders with radiculopathy, lumbar region: Secondary | ICD-10-CM

## 2022-07-13 LAB — COMPREHENSIVE METABOLIC PANEL
ALT: 22 U/L (ref 0–53)
AST: 20 U/L (ref 0–37)
Albumin: 5 g/dL (ref 3.5–5.2)
Alkaline Phosphatase: 46 U/L (ref 39–117)
BUN: 24 mg/dL — ABNORMAL HIGH (ref 6–23)
CO2: 30 mEq/L (ref 19–32)
Calcium: 10.8 mg/dL — ABNORMAL HIGH (ref 8.4–10.5)
Chloride: 104 mEq/L (ref 96–112)
Creatinine, Ser: 1.11 mg/dL (ref 0.40–1.50)
GFR: 65.79 mL/min (ref >60.00)
Glucose, Bld: 107 mg/dL — ABNORMAL HIGH (ref 70–99)
Potassium: 4.5 mEq/L (ref 3.5–5.1)
Sodium: 141 mEq/L (ref 135–145)
Total Bilirubin: 0.4 mg/dL (ref 0.2–1.2)
Total Protein: 7.7 g/dL (ref 6.0–8.3)

## 2022-07-13 LAB — CBC WITH DIFFERENTIAL/PLATELET
Basophils Absolute: 0.1 10*3/uL (ref 0.0–0.1)
Basophils Relative: 0.7 % (ref 0.0–3.0)
Eosinophils Absolute: 0.2 10*3/uL (ref 0.0–0.7)
Eosinophils Relative: 2.5 % (ref 0.0–5.0)
HCT: 43.1 % (ref 39.0–52.0)
Hemoglobin: 14.4 g/dL (ref 13.0–17.0)
Lymphocytes Relative: 19.2 % (ref 12.0–46.0)
Lymphs Abs: 1.7 10*3/uL (ref 0.7–4.0)
MCHC: 33.4 g/dL (ref 30.0–36.0)
MCV: 89.1 fl (ref 78.0–100.0)
Monocytes Absolute: 0.6 10*3/uL (ref 0.1–1.0)
Monocytes Relative: 7.1 % (ref 3.0–12.0)
Neutro Abs: 6.4 10*3/uL (ref 1.4–7.7)
Neutrophils Relative %: 70.5 % (ref 43.0–77.0)
Platelets: 264 10*3/uL (ref 150.0–400.0)
RBC: 4.84 Mil/uL (ref 4.22–5.81)
RDW: 13.5 % (ref 11.5–15.5)
WBC: 9 10*3/uL (ref 4.0–10.5)

## 2022-07-13 LAB — VITAMIN D 25 HYDROXY (VIT D DEFICIENCY, FRACTURES): VITD: 48.92 ng/mL (ref 30.00–100.00)

## 2022-07-13 LAB — PSA: PSA: 7.48 ng/mL — ABNORMAL HIGH (ref 0.10–4.00)

## 2022-07-13 LAB — SEDIMENTATION RATE: Sed Rate: 5 mm/hr (ref 0–20)

## 2022-07-13 MED ORDER — TRAMADOL HCL 50 MG PO TABS
50.0000 mg | ORAL_TABLET | Freq: Three times a day (TID) | ORAL | 0 refills | Status: AC | PRN
  Filled 2022-07-13 (×2): qty 15, 5d supply, fill #0

## 2022-07-13 MED ORDER — CIPROFLOXACIN HCL 500 MG PO TABS
500.0000 mg | ORAL_TABLET | Freq: Two times a day (BID) | ORAL | 0 refills | Status: AC
  Filled 2022-07-13 (×2): qty 20, 10d supply, fill #0

## 2022-07-13 NOTE — Patient Instructions (Addendum)
[facility] [phone] Cipro 2x a day for next 10 days Tramadol Labs today Write me when you get the MRI

## 2022-07-13 NOTE — Assessment & Plan Note (Signed)
Patient does have weakness noted to lower extremity.  Patient did have the CVA and has had right hemiparesis previously.  Patient as well as wife though does feel that the weakness is fairly significant.  Thinks it is different than what it was prior to the back pain and after the stroke.  We discussed with patient at great length that I do feel that there could be other things contributing including his gout to some of the other discomfort and pain.  Will get some laboratory workup as well.  Unable to tolerate the gabapentin.  With the weakness in dorsi flexion and the 1+ DTR I do feel that advanced imaging of the lumbar spine is warranted to see if he would be a candidate for possible injections.  Follow-up with me again after imaging to discuss further treatment options

## 2022-07-14 ENCOUNTER — Encounter: Payer: Self-pay | Admitting: Family Medicine

## 2022-07-15 ENCOUNTER — Other Ambulatory Visit (HOSPITAL_COMMUNITY): Payer: Self-pay

## 2022-07-15 LAB — PTH, INTACT AND CALCIUM
Calcium: 10.3 mg/dL (ref 8.6–10.3)
PTH: 23 pg/mL (ref 16–77)

## 2022-07-18 ENCOUNTER — Ambulatory Visit
Admission: RE | Admit: 2022-07-18 | Discharge: 2022-07-18 | Disposition: A | Discharge status: Home / Self Care | Payer: Commercial Managed Care - PPO | Source: Ambulatory Visit | Attending: Sports Medicine | Admitting: Sports Medicine

## 2022-07-18 DIAGNOSIS — M4726 Other spondylosis with radiculopathy, lumbar region: Secondary | ICD-10-CM | POA: Diagnosis not present

## 2022-07-18 DIAGNOSIS — M5441 Lumbago with sciatica, right side: Secondary | ICD-10-CM | POA: Diagnosis not present

## 2022-07-18 DIAGNOSIS — G8929 Other chronic pain: Secondary | ICD-10-CM

## 2022-07-18 DIAGNOSIS — M48062 Spinal stenosis, lumbar region with neurogenic claudication: Secondary | ICD-10-CM | POA: Diagnosis not present

## 2022-07-18 DIAGNOSIS — M5116 Intervertebral disc disorders with radiculopathy, lumbar region: Secondary | ICD-10-CM

## 2022-07-19 ENCOUNTER — Encounter: Payer: Self-pay | Admitting: Pharmacist

## 2022-07-19 ENCOUNTER — Other Ambulatory Visit: Payer: Self-pay

## 2022-07-20 ENCOUNTER — Other Ambulatory Visit (HOSPITAL_COMMUNITY): Payer: Self-pay

## 2022-07-20 ENCOUNTER — Other Ambulatory Visit (HOSPITAL_BASED_OUTPATIENT_CLINIC_OR_DEPARTMENT_OTHER): Payer: Self-pay

## 2022-07-22 NOTE — Progress Notes (Signed)
[facility] Summary Report / Loop Recorder 

## 2022-07-27 ENCOUNTER — Encounter: Payer: Self-pay | Admitting: Family Medicine

## 2022-07-29 NOTE — Progress Notes (Signed)
[facility] [address] [place] [address] Phone: [phone] Subjective:   INadine Counts, am serving as a scribe for Dr. Antoine Primas.  I'm seeing this patient by the request  of:  Shirline Frees, NP  CC: weakness and back pain   [id number]  07/13/2022 Patient does have weakness noted to lower extremity. Patient did have the CVA and has had right hemiparesis previously. Patient as well as wife though does feel that the weakness is fairly significant. Thinks it is different than what it was prior to the back pain and after the stroke. We discussed with patient at great length that I do feel that there could be other things contributing including his gout to some of the other discomfort and pain. Will get some laboratory workup as well. Unable to tolerate the gabapentin. With the weakness in dorsi flexion and the 1+ DTR I do feel that advanced imaging of the lumbar spine is warranted to see if he would be a candidate for possible injections. Follow-up with me again after imaging to discuss further treatment options   Updated 08/02/2022 Jens Som Sr. is a 72 y.o. male coming in with complaint of back pain.Mostly nerve pain in R leg. Sitting in about the same place. No new concerns. Wants to go over MRI.  Patient did have the MRI of the lumbar spine that was independently visualized by me again today showing moderate multifactorial spinal stenosis at L4-L5 and L5-S1.    Past Medical History:  Diagnosis Date   Carpal tunnel syndrome    Gout    Hyperlipidemia    on meds   Hypertension    on meds   Past Surgical History:  Procedure Laterality Date   COLONOSCOPY  2015   DJ-MAC-polyps   LOOP RECORDER INSERTION N/A 02/19/2020   Procedure: LOOP RECORDER INSERTION;  Surgeon: Regan Lemming, MD;  Location: [facility];  Service: Cardiovascular;  Laterality: N/A;   no prior surgery     POLYPECTOMY  2015   DJ-MAC-polyps   Social  History   Socioeconomic History   Marital status: Married    Spouse name: Not on file   Number of children: Not on file   Years of education: Not on file   Highest education level: Not on file  Occupational History   Not on file  Tobacco Use   Smoking status: Former    Current packs/day: 0.00    Types: Cigarettes    Quit date: 01/17/1966    Years since quitting: 56.5    Passive exposure: Never   Smokeless tobacco: Never  Vaping Use   Vaping status: Never Used  Substance and Sexual Activity   Alcohol use: Yes    Alcohol/week: 2.0 standard drinks of alcohol    Types: 2 Cans of beer per week    Comment: occassionally   Drug use: No   Sexual activity: Yes  Other Topics Concern   Not on file  Social History Narrative   ** Merged History Encounter **       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  Married  Two children, live locally.   Three dogs and two cats    Social Determinants of Health   Financial Resource Strain: Not on file  Food Insecurity: Not on file  Transportation Needs: Not on file  Physical Activity: Not on file  Stress: Not on file  Social Connections: Not  on file   Allergies  Allergen Reactions   Dust Mite Extract    Molds & Smuts    Family History  Problem Relation Age of Onset   Ovarian cancer Mother    Heart disease Father    Hypertension Father    Colon polyps Sister 1   Colon cancer Neg Hx    Esophageal cancer Neg Hx    Rectal cancer Neg Hx    Stomach cancer Neg Hx     Current Outpatient Medications (Endocrine & Metabolic):    methylPREDNISolone (MEDROL DOSEPAK) 4 MG TBPK tablet, Take as directed  Current Outpatient Medications (Cardiovascular):    amLODipine (NORVASC) 5 MG tablet, Take 1 tablet (5 mg total) by mouth daily.   EPINEPHrine 0.3 mg/0.3 mL IJ SOAJ injection, Inject 0.3 mg into the muscle as needed for anaphylaxis.   losartan (COZAAR) 100 MG tablet, Take 1 tablet (100 mg  total) by mouth daily.   rosuvastatin (CRESTOR) 20 MG tablet, Take 1 tablet (20 mg total) by mouth daily.  Current Outpatient Medications (Respiratory):    fexofenadine (ALLEGRA ALLERGY) 180 MG tablet, Take 1 tablet (180 mg total) by mouth 2 (two) times daily as needed (hives).  Current Outpatient Medications (Analgesics):    colchicine 0.6 MG tablet, Take 1 tablet (0.6 mg total) by mouth 2 (two) times daily.   meloxicam (MOBIC) 15 MG tablet, Take 1 tablet (15 mg total) by mouth daily.  Current Outpatient Medications (Hematological):    clopidogrel (PLAVIX) 75 MG tablet, Take 1 tablet (75 mg total) by mouth daily.  Current Outpatient Medications (Other):    cholecalciferol (VITAMIN D3) 25 MCG (1000 UNIT) tablet, Take 1 tablet (1,000 Units total) by mouth daily.   famotidine (PEPCID) 20 MG tablet, Take 1 tablet (20 mg total) by mouth 2 (two) times daily as needed (hives).   Reviewed prior external information including notes and imaging from  primary care provider As well as notes that were available from care everywhere and other healthcare systems.  Past medical history, social, surgical and family history all reviewed in electronic medical record.  No pertanent information unless stated regarding to the chief complaint.   Review of Systems:  No headache, visual changes, nausea, vomiting, diarrhea, constipation, dizziness, abdominal pain, skin rash, fevers, chills, night sweats, weight loss, swollen lymph nodes,  joint swelling, chest pain, shortness of breath, mood changes. POSITIVE muscle aches, body aches  Objective  Blood pressure 132/88, pulse 74, height 5\' 10"  (1.778 m), weight 172 lb (78 kg), SpO2 97%.   General: No apparent distress alert and oriented x3 mood and affect normal, dressed appropriately.  HEENT: Pupils equal, extraocular movements intact  Respiratory: Patient's speak in full sentences and does not appear short of breath  Cardiovascular: No lower extremity edema,  non tender, no erythema  Patient does have a mild wide-based gait noted.  Does have tightness noted in the legs bilaterally.    Impression and Recommendations:    The above documentation has been reviewed and is accurate and complete Judi Saa, DO

## 2022-08-01 ENCOUNTER — Other Ambulatory Visit: Payer: Self-pay

## 2022-08-01 DIAGNOSIS — M5416 Radiculopathy, lumbar region: Secondary | ICD-10-CM

## 2022-08-02 ENCOUNTER — Encounter: Payer: Self-pay | Admitting: Family Medicine

## 2022-08-02 ENCOUNTER — Ambulatory Visit: Payer: Commercial Managed Care - PPO | Admitting: Family Medicine

## 2022-08-02 VITALS — BP 132/88 | HR 74 | Ht 70.0 in | Wt 172.0 lb

## 2022-08-02 DIAGNOSIS — M5116 Intervertebral disc disorders with radiculopathy, lumbar region: Secondary | ICD-10-CM

## 2022-08-02 DIAGNOSIS — G8929 Other chronic pain: Secondary | ICD-10-CM | POA: Diagnosis not present

## 2022-08-02 DIAGNOSIS — M5441 Lumbago with sciatica, right side: Secondary | ICD-10-CM

## 2022-08-02 NOTE — Patient Instructions (Signed)
Good to see you! [facility] [phone] Call Today  PT [place] PT start 2 weeks after epidural See you again 6 weeks after epidural

## 2022-08-02 NOTE — Assessment & Plan Note (Signed)
Low back pain still having some radicular symptoms.  Patient's MRI did show potential impingement that could be giving some weakness.  Does seem to be more right greater than left.  Patient though did have a CVA and does have some weakness that is likely secondary to that as well.  I would like to see with patient having difficulty with daily activities if an epidural would be beneficial for some of the weakness as well as some of the discomfort and pain that he is having.  Still wants to avoid any type of surgical intervention.  Hopeful that we will be able to do that.  We discussed increasing activity otherwise in 2 to 4 weeks after this as well as restarting with formal physical therapy which patient did do well after his stroke.  Patient will then follow-up with me again in 6 to 8 weeks.  Total time discussing with patient at this moment as well as going over the MRI and the different pathology 36 minutes.

## 2022-08-08 ENCOUNTER — Ambulatory Visit (INDEPENDENT_AMBULATORY_CARE_PROVIDER_SITE_OTHER): Payer: Commercial Managed Care - PPO

## 2022-08-08 DIAGNOSIS — I639 Cerebral infarction, unspecified: Secondary | ICD-10-CM | POA: Diagnosis not present

## 2022-08-08 LAB — CUP PACEART REMOTE DEVICE CHECK
Date Time Interrogation Session: 20240720000311
Implantable Pulse Generator Implant Date: 20220202

## 2022-08-12 ENCOUNTER — Other Ambulatory Visit (HOSPITAL_COMMUNITY): Payer: Self-pay

## 2022-08-12 ENCOUNTER — Other Ambulatory Visit: Payer: Self-pay

## 2022-08-12 ENCOUNTER — Other Ambulatory Visit: Payer: Self-pay | Admitting: Adult Health

## 2022-08-12 MED ORDER — AMLODIPINE BESYLATE 5 MG PO TABS
5.0000 mg | ORAL_TABLET | Freq: Every day | ORAL | 0 refills | Status: DC
  Filled 2022-08-12: qty 90, 90d supply, fill #0

## 2022-08-16 ENCOUNTER — Other Ambulatory Visit (HOSPITAL_COMMUNITY): Payer: Self-pay

## 2022-08-16 ENCOUNTER — Other Ambulatory Visit: Payer: Self-pay | Admitting: Adult Health

## 2022-08-18 NOTE — Progress Notes (Signed)
[facility] Summary Report / Loop Recorder 

## 2022-09-08 LAB — CUP PACEART REMOTE DEVICE CHECK
Date Time Interrogation Session: 20240822000443
Implantable Pulse Generator Implant Date: 20220202

## 2022-09-12 ENCOUNTER — Ambulatory Visit: Payer: Commercial Managed Care - PPO

## 2022-09-12 DIAGNOSIS — I639 Cerebral infarction, unspecified: Secondary | ICD-10-CM | POA: Diagnosis not present

## 2022-09-21 NOTE — Progress Notes (Signed)
[facility] Summary Report / Loop Recorder 

## 2022-09-27 ENCOUNTER — Other Ambulatory Visit (HOSPITAL_COMMUNITY): Payer: Self-pay

## 2022-10-01 ENCOUNTER — Other Ambulatory Visit (HOSPITAL_COMMUNITY): Payer: Self-pay

## 2022-10-04 ENCOUNTER — Encounter: Payer: Self-pay | Admitting: Gastroenterology

## 2022-10-12 LAB — CUP PACEART REMOTE DEVICE CHECK
Date Time Interrogation Session: 20240924000217
Implantable Pulse Generator Implant Date: 20220202

## 2022-10-14 ENCOUNTER — Other Ambulatory Visit (HOSPITAL_COMMUNITY): Payer: Self-pay

## 2022-10-17 ENCOUNTER — Ambulatory Visit (INDEPENDENT_AMBULATORY_CARE_PROVIDER_SITE_OTHER): Payer: Commercial Managed Care - PPO

## 2022-10-17 DIAGNOSIS — I639 Cerebral infarction, unspecified: Secondary | ICD-10-CM | POA: Diagnosis not present

## 2022-10-31 NOTE — Progress Notes (Signed)
[facility] Summary Report / Loop Recorder 

## 2022-11-15 LAB — CUP PACEART REMOTE DEVICE CHECK
Date Time Interrogation Session: 20241027000221
Implantable Pulse Generator Implant Date: 20220202

## 2022-11-17 ENCOUNTER — Other Ambulatory Visit: Payer: Self-pay

## 2022-11-21 ENCOUNTER — Ambulatory Visit (INDEPENDENT_AMBULATORY_CARE_PROVIDER_SITE_OTHER): Payer: Commercial Managed Care - PPO

## 2022-11-21 DIAGNOSIS — I639 Cerebral infarction, unspecified: Secondary | ICD-10-CM | POA: Diagnosis not present

## 2022-11-24 ENCOUNTER — Other Ambulatory Visit (HOSPITAL_COMMUNITY): Payer: Self-pay

## 2022-11-24 ENCOUNTER — Other Ambulatory Visit: Payer: Self-pay | Admitting: Adult Health

## 2022-11-25 ENCOUNTER — Other Ambulatory Visit: Payer: Self-pay

## 2022-11-25 MED ORDER — AMLODIPINE BESYLATE 5 MG PO TABS
5.0000 mg | ORAL_TABLET | Freq: Every day | ORAL | 0 refills | Status: DC
  Filled 2022-11-25: qty 90, 90d supply, fill #0

## 2022-12-12 NOTE — Progress Notes (Signed)
[facility] Summary Report / Loop Recorder 

## 2022-12-14 ENCOUNTER — Other Ambulatory Visit (HOSPITAL_COMMUNITY): Payer: Self-pay

## 2022-12-14 ENCOUNTER — Other Ambulatory Visit: Payer: Self-pay | Admitting: Adult Health

## 2022-12-14 DIAGNOSIS — M109 Gout, unspecified: Secondary | ICD-10-CM

## 2022-12-14 MED ORDER — ROSUVASTATIN CALCIUM 20 MG PO TABS
20.0000 mg | ORAL_TABLET | Freq: Every day | ORAL | 1 refills | Status: DC
  Filled 2022-12-14: qty 90, 90d supply, fill #0
  Filled 2023-03-17: qty 90, 90d supply, fill #1

## 2022-12-14 MED ORDER — COLCHICINE 0.6 MG PO TABS
0.6000 mg | ORAL_TABLET | Freq: Two times a day (BID) | ORAL | 1 refills | Status: DC
  Filled 2022-12-14: qty 180, 90d supply, fill #0
  Filled 2023-06-23: qty 180, 90d supply, fill #1

## 2022-12-26 ENCOUNTER — Ambulatory Visit (INDEPENDENT_AMBULATORY_CARE_PROVIDER_SITE_OTHER): Payer: Commercial Managed Care - PPO

## 2022-12-26 DIAGNOSIS — I639 Cerebral infarction, unspecified: Secondary | ICD-10-CM

## 2022-12-26 LAB — CUP PACEART REMOTE DEVICE CHECK
Date Time Interrogation Session: 20241209000202
Implantable Pulse Generator Implant Date: 20220202

## 2023-01-12 ENCOUNTER — Other Ambulatory Visit (HOSPITAL_COMMUNITY): Payer: Self-pay

## 2023-01-23 ENCOUNTER — Telehealth: Payer: Self-pay

## 2023-01-23 NOTE — Telephone Encounter (Signed)
 Pt needs an appt. Tried to call pt no answer.

## 2023-01-23 NOTE — Telephone Encounter (Signed)
 Copied from CRM [id number]. Topic: Clinical - Medication Question >> Jan 23, 2023  3:42 PM Robinson H wrote: Reason for CRM: Patient is calling following up to see if provider will be calling in an antiviral medication for COVID for him. Please reach out to patient. Previous CRM's are closed and agent doesn't see any notes to relay to patient.

## 2023-01-23 NOTE — Telephone Encounter (Signed)
 Spoke to pt and advised that he would need an appt. Also advised that our schedules are booked up. [facility].

## 2023-01-23 NOTE — Telephone Encounter (Signed)
 Copied from CRM [id number]. Topic: Clinical - Medical Advice >> Jan 23, 2023 12:04 PM Antonio DEL wrote: Reason for CRM: Patient's wife is calling in regards to antiviral medication for the patient. She tested positive for Covid at the walk in clinic and says Gordy tested positive too but they took a home test for him. She was following up to see if antiviral can be sent. If it can be sent, would like it to be sent to [facility] in [place]

## 2023-01-23 NOTE — Telephone Encounter (Signed)
 Copied from CRM [id number]. Topic: Clinical - Medical Advice >> Jan 23, 2023  9:01 AM Irine Seal wrote: Reason for CRM: Pt tested positive for covid, wanting to know if antiviral can be called in or if he needs to be seen.

## 2023-01-24 ENCOUNTER — Ambulatory Visit (HOSPITAL_BASED_OUTPATIENT_CLINIC_OR_DEPARTMENT_OTHER)
Admission: RE | Admit: 2023-01-24 | Discharge: 2023-01-24 | Disposition: A | Discharge status: Home / Self Care | Payer: Commercial Managed Care - PPO | Source: Ambulatory Visit | Attending: Family Medicine | Admitting: Family Medicine

## 2023-01-24 ENCOUNTER — Encounter (HOSPITAL_BASED_OUTPATIENT_CLINIC_OR_DEPARTMENT_OTHER): Payer: Self-pay

## 2023-01-24 VITALS — BP 149/83 | HR 85 | Temp 98.3°F | Resp 18

## 2023-01-24 DIAGNOSIS — B9789 Other viral agents as the cause of diseases classified elsewhere: Secondary | ICD-10-CM | POA: Insufficient documentation

## 2023-01-24 DIAGNOSIS — U071 COVID-19: Secondary | ICD-10-CM | POA: Insufficient documentation

## 2023-01-24 DIAGNOSIS — J069 Acute upper respiratory infection, unspecified: Secondary | ICD-10-CM | POA: Diagnosis not present

## 2023-01-24 DIAGNOSIS — Z20822 Contact with and (suspected) exposure to covid-19: Secondary | ICD-10-CM

## 2023-01-24 DIAGNOSIS — R051 Acute cough: Secondary | ICD-10-CM | POA: Diagnosis not present

## 2023-01-24 MED ORDER — PROMETHAZINE-DM 6.25-15 MG/5ML PO SYRP: 5.0000 mL | ORAL_SOLUTION | Freq: Four times a day (QID) | ORAL | 0 refills | Status: AC | PRN

## 2023-01-24 MED ORDER — PAXLOVID (300/100) 20 X 150 MG & 10 X 100MG PO TBPK: ORAL_TABLET | ORAL | 0 refills | Status: DC

## 2023-01-24 NOTE — Discharge Instructions (Signed)
 Exam and history are consistent with COVID-19 infection.  Educated about COVID-19 precautions and viral respiratory infections.  Handouts provided.  Take Paxlovid  as directed on the package for 5 days.  Hold colchicine  for 6 days, while on Paxlovid .  Provided promethazine  DM cough syrup, 1 teaspoon, every 6 hours, as needed for cough.  Educated on the use of Paxlovid  and promethazine  syrup.  Return here if symptoms do not improve, worsen or if new symptoms occur.

## 2023-01-24 NOTE — Telephone Encounter (Signed)
 Pt notified yesterday to seek help at local UC due to no appt. Availability. Pt verbalized understanding.

## 2023-01-24 NOTE — ED Triage Notes (Signed)
 Pt reports he tested positive for Covid on a home test yesterday, son and wife tested positive over the weekend. Pt c/o coughing, congestion, chills, headache, body aches x 2 days ago.

## 2023-01-24 NOTE — ED Provider Notes (Signed)
 [facility]    CSN: [id number] Arrival date & time: 01/24/23  1059      History   Chief Complaint Chief Complaint  Patient presents with   Cough    Wife tested positive for covid on Sat Jan 4th at [facility] [place]. I took a home test this morning and tested positive. I would like the antivirals men's and if you need to repeat the covid test you can. I also have congestion and chills. - Entered by patient    HPI Brent Mccormick. is a 73 y.o. male.   Patient reports that he has had low-grade fevers, head congestion, headache, body aches, cough for 24 to 48 hours.  His wife and son were seen at urgent care and tested positive for COVID 2 days ago.  His symptoms started after their visit at urgent care.  He home tested yesterday and was positive for COVID.  He denies nausea, vomiting, diarrhea, constipation, wheezing.   Cough Associated symptoms: chills, fever and rhinorrhea   Associated symptoms: no chest pain, no ear pain, no rash, no shortness of breath, no sore throat and no wheezing     Past Medical History:  Diagnosis Date   Carpal tunnel syndrome    Gout    Hyperlipidemia    on meds   Hypertension    on meds    Patient Active Problem List   Diagnosis Date Noted   Pain in joint of right shoulder 06/24/2020   Spasticity as late effect of cerebrovascular accident (CVA) 04/22/2020   Elevated BUN    Sleep disturbance    Infarction of left basal ganglia (HCC) 02/21/2020   Right hemiparesis (HCC)    Leukocytosis    Sinus tachycardia    Dyslipidemia    History of gout    Left sided lacunar infarction (HCC) 02/16/2020   Chronic hepatitis C without hepatic coma (HCC) 01/20/2016   Lumbar disc disease with radiculopathy 03/07/2012   Essential hypertension 12/14/2009   RHINITIS 03/26/2008   Gout 01/15/2007   ERECTILE DYSFUNCTION 01/15/2007   CARPAL TUNNEL SYNDROME, RIGHT 01/15/2007    Past Surgical History:  Procedure Laterality Date   COLONOSCOPY   2015   DJ-MAC-polyps   LOOP RECORDER INSERTION N/A 02/19/2020   Procedure: LOOP RECORDER INSERTION;  Surgeon: Inocencio Soyla Lunger, MD;  Location: [facility];  Service: Cardiovascular;  Laterality: N/A;   no prior surgery     POLYPECTOMY  2015   DJ-MAC-polyps       Home Medications    Prior to Admission medications   Medication Sig Start Date End Date Taking? Authorizing Provider  nirmatrelvir/ritonavir (PAXLOVID , 300/100,) 20 x 150 MG & 10 x 100MG  TBPK Patient GFR is 65 on 07/13/22.  Take nirmatrelvir (150 mg) two tablets twice daily for 5 days and ritonavir (100 mg) one tablet twice daily for 5 days.  Hold Colchicine  for 6 days, while on Paxlovid . 01/24/23  Yes Ival Domino, FNP  promethazine -dextromethorphan (PROMETHAZINE -DM) 6.25-15 MG/5ML syrup Take 5 mLs by mouth 4 (four) times daily as needed for cough. 01/24/23  Yes Ival Domino, FNP  amLODipine  (NORVASC ) 5 MG tablet Take 1 tablet (5 mg total) by mouth daily. 11/25/22   Nafziger, Darleene, NP  cholecalciferol  (VITAMIN D3) 25 MCG (1000 UNIT) tablet Take 1 tablet (1,000 Units total) by mouth daily. 03/03/20   Angiulli, Toribio PARAS, PA-C  clopidogrel  (PLAVIX ) 75 MG tablet Take 1 tablet (75 mg total) by mouth daily. 04/18/22   Merna Darleene, NP  colchicine  0.6 MG tablet Take 1 tablet (0.6 mg total) by mouth 2 (two) times daily. 12/14/22   Nafziger, Darleene, NP  EPINEPHrine  0.3 mg/0.3 mL IJ SOAJ injection Inject 0.3 mg into the muscle as needed for anaphylaxis. 11/19/21   Nafziger, Darleene, NP  famotidine  (PEPCID ) 20 MG tablet Take 1 tablet (20 mg total) by mouth 2 (two) times daily as needed (hives). 03/04/22   Tobie Arleta SQUIBB, MD  fexofenadine  (ALLEGRA  ALLERGY ) 180 MG tablet Take 1 tablet (180 mg total) by mouth 2 (two) times daily as needed (hives). 03/04/22   Tobie Arleta SQUIBB, MD  losartan  (COZAAR ) 100 MG tablet Take 1 tablet (100 mg total) by mouth daily. 05/20/22   Nafziger, Darleene, NP  rosuvastatin  (CRESTOR ) 20 MG tablet Take 1 tablet (20 mg total) by  mouth daily. 12/14/22   Nafziger, Darleene, NP    Family History Family History  Problem Relation Age of Onset   Ovarian cancer Mother    Heart disease Father    Hypertension Father    Colon polyps Sister 26   Colon cancer Neg Hx    Esophageal cancer Neg Hx    Rectal cancer Neg Hx    Stomach cancer Neg Hx     Social History Social History   Tobacco Use   Smoking status: Former    Current packs/day: 0.00    Types: Cigarettes    Quit date: 01/17/1966    Years since quitting: 57.0    Passive exposure: Never   Smokeless tobacco: Never  Vaping Use   Vaping status: Never Used  Substance Use Topics   Alcohol use: Yes    Alcohol/week: 2.0 standard drinks of alcohol    Types: 2 Cans of beer per week    Comment: occassionally   Drug use: No     Allergies   Dust mite extract and Molds & smuts   Review of Systems Review of Systems  Constitutional:  Positive for chills and fever.  HENT:  Positive for congestion and rhinorrhea. Negative for ear pain and sore throat.   Eyes:  Negative for pain and visual disturbance.  Respiratory:  Positive for cough. Negative for shortness of breath and wheezing.   Cardiovascular:  Negative for chest pain and palpitations.  Gastrointestinal:  Negative for abdominal pain and vomiting.  Genitourinary:  Negative for dysuria and hematuria.  Musculoskeletal:  Positive for arthralgias. Negative for back pain.  Skin:  Negative for color change and rash.  Neurological:  Negative for seizures and syncope.  All other systems reviewed and are negative.    Physical Exam Triage Vital Signs ED Triage Vitals  Encounter Vitals Group     BP 01/24/23 1114 (!) 149/83     Systolic BP Percentile --      Diastolic BP Percentile --      Pulse Rate 01/24/23 1114 85     Resp 01/24/23 1114 18     Temp 01/24/23 1114 98.3 F (36.8 C)     Temp Source 01/24/23 1114 Oral     SpO2 01/24/23 1114 95 %     Weight --      Height --      Head Circumference --       Peak Flow --      Pain Score 01/24/23 1113 0     Pain Loc --      Pain Education --      Exclude from Growth Chart --    No data found.  Updated Vital  Signs BP (!) 149/83 (BP Location: Right Arm)   Pulse 85   Temp 98.3 F (36.8 C) (Oral)   Resp 18   SpO2 95%   Visual Acuity Right Eye Distance:   Left Eye Distance:   Bilateral Distance:    Right Eye Near:   Left Eye Near:    Bilateral Near:     Physical Exam Vitals and nursing note reviewed.  Constitutional:      General: He is not in acute distress.    Appearance: He is well-developed.  HENT:     Head: Normocephalic and atraumatic.     Right Ear: Hearing, tympanic membrane, ear canal and external ear normal.     Left Ear: Hearing, tympanic membrane, ear canal and external ear normal.     Nose: Mucosal edema, congestion and rhinorrhea present. Rhinorrhea is clear.     Right Turbinates: Swollen.     Left Turbinates: Swollen.     Right Sinus: No maxillary sinus tenderness or frontal sinus tenderness.     Left Sinus: No maxillary sinus tenderness or frontal sinus tenderness.     Mouth/Throat:     Lips: Pink.     Mouth: Mucous membranes are moist.     Pharynx: Uvula midline. No oropharyngeal exudate or posterior oropharyngeal erythema.     Tonsils: No tonsillar exudate.  Eyes:     Conjunctiva/sclera: Conjunctivae normal.     Pupils: Pupils are equal, round, and reactive to light.  Cardiovascular:     Rate and Rhythm: Normal rate and regular rhythm.     Heart sounds: Normal heart sounds, S1 normal and S2 normal. No murmur heard. Pulmonary:     Effort: Pulmonary effort is normal. No respiratory distress.     Breath sounds: Normal breath sounds.  Abdominal:     Palpations: Abdomen is soft.     Tenderness: There is no abdominal tenderness.  Musculoskeletal:        General: No swelling.     Cervical back: Neck supple.  Lymphadenopathy:     Head:     Right side of head: No submandibular adenopathy.     Left side of  head: No submandibular adenopathy.     Cervical: Cervical adenopathy present.     Right cervical: Superficial cervical adenopathy present.     Left cervical: Superficial cervical adenopathy present.  Skin:    General: Skin is warm and dry.     Capillary Refill: Capillary refill takes less than 2 seconds.     Findings: No rash.  Neurological:     Mental Status: He is alert and oriented to person, place, and time.  Psychiatric:        Mood and Affect: Mood normal.      UC Treatments / Results  Labs (all labs ordered are listed, but only abnormal results are displayed) Labs Reviewed  SARS CORONAVIRUS 2 (TAT 6-24 HRS)    EKG   Radiology No results found.  Procedures Procedures (including critical care time)  Medications Ordered in UC Medications - No data to display  Initial Impression / Assessment and Plan / UC Course  I have reviewed the triage vital signs and the nursing notes.  Pertinent labs & imaging results that were available during my care of the patient were reviewed by me and considered in my medical decision making (see chart for details).  Suspected COVID-19, cough, viral upper respiratory infection: Patient's wife and adult son (who lives in their home) were both diagnosed with COVID-19  2 to 3 days ago.  The patient started with symptoms 24 to 36 hours ago.  COVID test sent for evaluation.  He had a positive home COVID test last night.  His EGFR from June of 2024 was 65.  Will treat with Paxlovid  twice daily for 5 days.  Educated on the risk benefits and alternatives of using Paxlovid .  Provided promethazine  DM cough syrup, 5 mL, every 6 hours, as needed for cough.  Return here or to primary care if symptoms do not improve, worsen, or new symptoms occur.  Advised that if his breathing changes, he gets very short of breath, he should go to the emergency room for further evaluation. Final Clinical Impressions(s) / UC Diagnoses   Final diagnoses:  Viral upper  respiratory infection  Suspected COVID-19 virus infection  Acute cough     Discharge Instructions      Exam and history are consistent with COVID-19 infection.  Educated about COVID-19 precautions and viral respiratory infections.  Handouts provided.  Take Paxlovid  as directed on the package for 5 days.  Hold colchicine  for 6 days, while on Paxlovid .  Provided promethazine  DM cough syrup, 1 teaspoon, every 6 hours, as needed for cough.  Educated on the use of Paxlovid  and promethazine  syrup.  Return here if symptoms do not improve, worsen or if new symptoms occur.     ED Prescriptions     Medication Sig Dispense Auth. Provider   promethazine -dextromethorphan (PROMETHAZINE -DM) 6.25-15 MG/5ML syrup Take 5 mLs by mouth 4 (four) times daily as needed for cough. 118 mL Ival Domino, FNP   nirmatrelvir/ritonavir (PAXLOVID , 300/100,) 20 x 150 MG & 10 x 100MG  TBPK Patient GFR is 65 on 07/13/22.  Take nirmatrelvir (150 mg) two tablets twice daily for 5 days and ritonavir (100 mg) one tablet twice daily for 5 days.  Hold Colchicine  for 6 days, while on Paxlovid . 30 tablet Ival Domino, FNP      PDMP not reviewed this encounter.   Ival Domino, FNP 01/24/23 1423

## 2023-01-25 LAB — SARS CORONAVIRUS 2 (TAT 6-24 HRS): SARS Coronavirus 2: POSITIVE — AB

## 2023-01-30 ENCOUNTER — Ambulatory Visit (INDEPENDENT_AMBULATORY_CARE_PROVIDER_SITE_OTHER): Payer: Commercial Managed Care - PPO

## 2023-01-30 DIAGNOSIS — I639 Cerebral infarction, unspecified: Secondary | ICD-10-CM | POA: Diagnosis not present

## 2023-01-31 LAB — CUP PACEART REMOTE DEVICE CHECK
Date Time Interrogation Session: 20250113000210
Implantable Pulse Generator Implant Date: 20220202

## 2023-02-09 ENCOUNTER — Other Ambulatory Visit (HOSPITAL_COMMUNITY): Payer: Self-pay

## 2023-02-09 ENCOUNTER — Other Ambulatory Visit: Payer: Self-pay | Admitting: Adult Health

## 2023-02-10 ENCOUNTER — Other Ambulatory Visit (HOSPITAL_COMMUNITY): Payer: Self-pay

## 2023-02-10 ENCOUNTER — Other Ambulatory Visit: Payer: Self-pay

## 2023-02-10 MED ORDER — AMLODIPINE BESYLATE 5 MG PO TABS
5.0000 mg | ORAL_TABLET | Freq: Every day | ORAL | 0 refills | Status: DC
  Filled 2023-02-10: qty 30, 30d supply, fill #0

## 2023-02-10 NOTE — Telephone Encounter (Signed)
Patient need to schedule CPE for more refills. Rx refilled for 30 days.

## 2023-03-06 ENCOUNTER — Ambulatory Visit (INDEPENDENT_AMBULATORY_CARE_PROVIDER_SITE_OTHER): Payer: Commercial Managed Care - PPO

## 2023-03-06 DIAGNOSIS — I639 Cerebral infarction, unspecified: Secondary | ICD-10-CM

## 2023-03-06 LAB — CUP PACEART REMOTE DEVICE CHECK
Date Time Interrogation Session: 20250217000248
Implantable Pulse Generator Implant Date: 20220202

## 2023-03-13 NOTE — Addendum Note (Signed)
 Addended by: Geralyn Flash D on: 03/13/2023 11:26 AM   Modules accepted: Orders

## 2023-03-13 NOTE — Progress Notes (Signed)
 [facility] Summary Report / Loop Recorder

## 2023-03-17 ENCOUNTER — Other Ambulatory Visit: Payer: Self-pay | Admitting: Adult Health

## 2023-03-17 ENCOUNTER — Other Ambulatory Visit: Payer: Self-pay

## 2023-03-17 ENCOUNTER — Other Ambulatory Visit (HOSPITAL_COMMUNITY): Payer: Self-pay

## 2023-03-17 MED ORDER — AMLODIPINE BESYLATE 5 MG PO TABS
5.0000 mg | ORAL_TABLET | Freq: Every day | ORAL | 0 refills | Status: DC
  Filled 2023-03-17: qty 30, 30d supply, fill #0

## 2023-04-10 ENCOUNTER — Ambulatory Visit (INDEPENDENT_AMBULATORY_CARE_PROVIDER_SITE_OTHER): Payer: Commercial Managed Care - PPO

## 2023-04-10 DIAGNOSIS — I639 Cerebral infarction, unspecified: Secondary | ICD-10-CM | POA: Diagnosis not present

## 2023-04-10 LAB — CUP PACEART REMOTE DEVICE CHECK
Date Time Interrogation Session: 20250324000612
Implantable Pulse Generator Implant Date: 20220202

## 2023-04-11 NOTE — Progress Notes (Signed)
 [facility] Summary Report / Loop Recorder

## 2023-04-13 ENCOUNTER — Other Ambulatory Visit (HOSPITAL_COMMUNITY): Payer: Self-pay

## 2023-04-13 ENCOUNTER — Other Ambulatory Visit: Payer: Self-pay | Admitting: Adult Health

## 2023-04-13 MED ORDER — CLOPIDOGREL BISULFATE 75 MG PO TABS
75.0000 mg | ORAL_TABLET | Freq: Every day | ORAL | 3 refills | Status: AC
  Filled 2023-04-13: qty 90, 90d supply, fill #0
  Filled 2023-07-14: qty 90, 90d supply, fill #1
  Filled 2023-10-17: qty 90, 90d supply, fill #2
  Filled 2024-01-09 – 2024-01-20 (×2): qty 90, 90d supply, fill #3

## 2023-04-21 ENCOUNTER — Other Ambulatory Visit (HOSPITAL_COMMUNITY): Payer: Self-pay

## 2023-04-21 ENCOUNTER — Other Ambulatory Visit: Payer: Self-pay | Admitting: Adult Health

## 2023-04-21 NOTE — Telephone Encounter (Signed)
 Patient need to schedule CPE for more refills.

## 2023-04-22 ENCOUNTER — Other Ambulatory Visit (HOSPITAL_COMMUNITY): Payer: Self-pay

## 2023-04-27 ENCOUNTER — Other Ambulatory Visit (HOSPITAL_COMMUNITY): Payer: Self-pay

## 2023-04-27 ENCOUNTER — Other Ambulatory Visit: Payer: Self-pay | Admitting: Adult Health

## 2023-04-27 ENCOUNTER — Other Ambulatory Visit: Payer: Self-pay

## 2023-04-27 MED ORDER — AMLODIPINE BESYLATE 5 MG PO TABS
5.0000 mg | ORAL_TABLET | Freq: Every day | ORAL | 0 refills | Status: DC
  Filled 2023-04-27: qty 30, 30d supply, fill #0

## 2023-04-27 NOTE — Telephone Encounter (Signed)
 Copied from CRM [id number]. Topic: General - Other >> Apr 27, 2023 10:11 AM Emylou G wrote: Reason for CRM: Pls call patient.Marland Kitchen looking to fill his amLODipine (NORVASC) 5 MG tablet.. we made an appt for Monday.. but he only has 5 days

## 2023-05-01 ENCOUNTER — Ambulatory Visit: Admitting: Family Medicine

## 2023-05-12 ENCOUNTER — Other Ambulatory Visit: Payer: Self-pay

## 2023-05-12 ENCOUNTER — Other Ambulatory Visit (HOSPITAL_COMMUNITY): Payer: Self-pay

## 2023-05-12 ENCOUNTER — Other Ambulatory Visit: Payer: Self-pay | Admitting: Adult Health

## 2023-05-12 DIAGNOSIS — I10 Essential (primary) hypertension: Secondary | ICD-10-CM

## 2023-05-12 MED ORDER — LOSARTAN POTASSIUM 100 MG PO TABS
100.0000 mg | ORAL_TABLET | Freq: Every day | ORAL | 3 refills | Status: AC
  Filled 2023-05-12: qty 90, 90d supply, fill #0
  Filled 2023-08-18: qty 90, 90d supply, fill #1
  Filled 2023-11-14: qty 90, 90d supply, fill #2
  Filled 2024-02-15: qty 90, 90d supply, fill #3

## 2023-05-15 ENCOUNTER — Ambulatory Visit: Payer: Commercial Managed Care - PPO

## 2023-05-15 DIAGNOSIS — I639 Cerebral infarction, unspecified: Secondary | ICD-10-CM

## 2023-05-15 LAB — CUP PACEART REMOTE DEVICE CHECK
Date Time Interrogation Session: 20250428000054
Implantable Pulse Generator Implant Date: 20220202

## 2023-05-26 ENCOUNTER — Other Ambulatory Visit: Payer: Self-pay | Admitting: Adult Health

## 2023-05-26 ENCOUNTER — Other Ambulatory Visit (HOSPITAL_COMMUNITY): Payer: Self-pay

## 2023-05-26 NOTE — Telephone Encounter (Signed)
 Patient need to schedule for more refills.

## 2023-05-26 NOTE — Progress Notes (Signed)
 [facility] Summary Report / Loop Recorder

## 2023-05-29 ENCOUNTER — Other Ambulatory Visit (HOSPITAL_COMMUNITY): Payer: Self-pay

## 2023-05-31 ENCOUNTER — Ambulatory Visit: Payer: Self-pay | Admitting: Adult Health

## 2023-05-31 ENCOUNTER — Encounter: Payer: Self-pay | Admitting: Adult Health

## 2023-05-31 ENCOUNTER — Other Ambulatory Visit (HOSPITAL_COMMUNITY): Payer: Self-pay

## 2023-05-31 ENCOUNTER — Other Ambulatory Visit: Payer: Self-pay | Admitting: Adult Health

## 2023-05-31 ENCOUNTER — Ambulatory Visit (INDEPENDENT_AMBULATORY_CARE_PROVIDER_SITE_OTHER): Admitting: Adult Health

## 2023-05-31 VITALS — BP 136/80 | HR 67 | Temp 98.1°F | Ht 68.0 in | Wt 171.0 lb

## 2023-05-31 DIAGNOSIS — Z8739 Personal history of other diseases of the musculoskeletal system and connective tissue: Secondary | ICD-10-CM

## 2023-05-31 DIAGNOSIS — R972 Elevated prostate specific antigen [PSA]: Secondary | ICD-10-CM

## 2023-05-31 DIAGNOSIS — K219 Gastro-esophageal reflux disease without esophagitis: Secondary | ICD-10-CM | POA: Diagnosis not present

## 2023-05-31 DIAGNOSIS — E785 Hyperlipidemia, unspecified: Secondary | ICD-10-CM

## 2023-05-31 DIAGNOSIS — I10 Essential (primary) hypertension: Secondary | ICD-10-CM

## 2023-05-31 DIAGNOSIS — Z0000 Encounter for general adult medical examination without abnormal findings: Secondary | ICD-10-CM

## 2023-05-31 DIAGNOSIS — Z8673 Personal history of transient ischemic attack (TIA), and cerebral infarction without residual deficits: Secondary | ICD-10-CM | POA: Diagnosis not present

## 2023-05-31 DIAGNOSIS — I639 Cerebral infarction, unspecified: Secondary | ICD-10-CM

## 2023-05-31 LAB — CBC
HCT: 44.5 % (ref 39.0–52.0)
Hemoglobin: 15.1 g/dL (ref 13.0–17.0)
MCHC: 33.9 g/dL (ref 30.0–36.0)
MCV: 90.2 fl (ref 78.0–100.0)
Platelets: 259 10*3/uL (ref 150.0–400.0)
RBC: 4.93 Mil/uL (ref 4.22–5.81)
RDW: 13.3 % (ref 11.5–15.5)
WBC: 7.2 10*3/uL (ref 4.0–10.5)

## 2023-05-31 LAB — COMPREHENSIVE METABOLIC PANEL WITH GFR
ALT: 24 U/L (ref 0–53)
AST: 20 U/L (ref 0–37)
Albumin: 4.9 g/dL (ref 3.5–5.2)
Alkaline Phosphatase: 46 U/L (ref 39–117)
BUN: 17 mg/dL (ref 6–23)
CO2: 27 meq/L (ref 19–32)
Calcium: 9.9 mg/dL (ref 8.4–10.5)
Chloride: 104 meq/L (ref 96–112)
Creatinine, Ser: 1.03 mg/dL (ref 0.40–1.50)
GFR: 71.53 mL/min (ref >60.00)
Glucose, Bld: 104 mg/dL — ABNORMAL HIGH (ref 70–99)
Potassium: 4.4 meq/L (ref 3.5–5.1)
Sodium: 141 meq/L (ref 135–145)
Total Bilirubin: 0.5 mg/dL (ref 0.2–1.2)
Total Protein: 7.4 g/dL (ref 6.0–8.3)

## 2023-05-31 LAB — LIPID PANEL
Cholesterol: 147 mg/dL (ref 0–200)
HDL: 55.1 mg/dL (ref >39.00)
LDL Cholesterol: 66 mg/dL (ref 0–99)
NonHDL: 91.74
Total CHOL/HDL Ratio: 3
Triglycerides: 129 mg/dL (ref 0.0–149.0)
VLDL: 25.8 mg/dL (ref 0.0–40.0)

## 2023-05-31 LAB — TSH: TSH: 1.73 u[IU]/mL (ref 0.35–5.50)

## 2023-05-31 LAB — PSA: PSA: 6.53 ng/mL — ABNORMAL HIGH (ref 0.10–4.00)

## 2023-05-31 MED ORDER — ROSUVASTATIN CALCIUM 20 MG PO TABS
20.0000 mg | ORAL_TABLET | Freq: Every day | ORAL | 3 refills | Status: AC
  Filled 2023-05-31: qty 90, 90d supply, fill #0
  Filled 2023-09-22: qty 90, 90d supply, fill #1
  Filled 2023-12-15: qty 90, 90d supply, fill #2

## 2023-05-31 NOTE — Patient Instructions (Signed)
 It was great seeing you today   We will follow up with you regarding your lab work   Please let me know if you need anything

## 2023-05-31 NOTE — Progress Notes (Signed)
 Subjective:    Patient ID: Brent Distel Sr., male    DOB: 1950/03/19, 73 y.o.   MRN: [id number]  HPI Patient presents for yearly preventative medicine examination. He is a pleasant 73 year old male who  has a past medical history of Carpal tunnel syndrome, Gout, Hyperlipidemia, and Hypertension.  Hypertension-managed with Norvasc  5 mg daily and Cozaar  100 mg daily.  She denies dizziness, lightheadedness, chest pain, or shortness of breath. BP Readings from Last 3 Encounters:  05/31/23 136/80  01/24/23 (!) 149/83  08/02/22 132/88   Hyperlipidemia - managed with Lipitor 20 mg daily.  Lab Results  Component Value Date   CHOL 168 01/13/2022   HDL 56.80 01/13/2022   LDLCALC 76 01/13/2022   LDLDIRECT 134.0 06/11/2019   TRIG 179.0 (H) 01/13/2022   CHOLHDL 3 01/13/2022   History of cryptogenic stroke-presented to the emergency room on 02/16/2020 with right-sided weakness and facial droop.  CT angiogram head and neck was negative for large vessel occlusion.  MRI showed acute infarct left basilar ganglia and radiating white matter tracts.  He did receive tPA.  Currently managed with Plavix  75 mg daily and Crestor  20 mg daily. He has had full recovery.   GERD-takes Protonix  40 mg daily.  Well-controlled  Chronic gout-managed with colchicine  0.6 mg daily.  He denies any recent gout flares.   Elevated PSA  He has not been seen by Urology since May 2023.   In 2022 his PSA went up to 6.09.  He does report occasional BPH symptoms of decreased stream and urgency.  Did have an MRI of the prostate in April 2023 which showed a benign prostate that was 77 g. Lab Results  Component Value Date   PSA 7.48 (H) 07/13/2022   PSA 6.09 (H) 01/13/2022   PSA 4.4 05/31/2021   All immunizations and health maintenance protocols were reviewed with the patient and needed orders were placed.  Appropriate screening laboratory values were ordered for the patient including screening of hyperlipidemia, renal  function and hepatic function. If indicated by BPH, a PSA was ordered.  Medication reconciliation,  past medical history, social history, problem list and allergies were reviewed in detail with the patient  Goals were established with regard to weight loss, exercise, and  diet in compliance with medications Wt Readings from Last 3 Encounters:  05/31/23 171 lb (77.6 kg)  08/02/22 172 lb (78 kg)  05/12/22 171 lb (77.6 kg)   He is overdue for colon cancer screening. His last was in 2021 and he is on the three year plan d/t polyps. - he is going to call and schedule.    Review of Systems  Constitutional: Negative.   HENT: Negative.    Eyes: Negative.   Respiratory: Negative.    Cardiovascular: Negative.   Gastrointestinal: Negative.   Endocrine: Negative.   Genitourinary:  Positive for difficulty urinating and urgency.  Musculoskeletal: Negative.   Skin: Negative.   Allergic/Immunologic: Negative.   Neurological: Negative.   Hematological: Negative.   Psychiatric/Behavioral: Negative.    All other systems reviewed and are negative.    Past Medical History:  Diagnosis Date   Carpal tunnel syndrome    Gout    Hyperlipidemia    on meds   Hypertension    on meds    Social History   Socioeconomic History   Marital status: Married    Spouse name: Not on file   Number of children: Not on file   Years of education: Not on  file   Highest education level: Not on file  Occupational History   Not on file  Tobacco Use   Smoking status: Former    Current packs/day: 0.00    Types: Cigarettes    Quit date: 01/17/1966    Years since quitting: 57.4    Passive exposure: Never   Smokeless tobacco: Never  Vaping Use   Vaping status: Never Used  Substance and Sexual Activity   Alcohol use: Yes    Alcohol/week: 2.0 standard drinks of alcohol    Types: 2 Cans of beer per week    Comment: occassionally   Drug use: No   Sexual activity: Yes  Other Topics Concern   Not on file   Social History Narrative   ** Merged History Encounter **       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  Married  Two children, live locally.   Three dogs and two cats    Social Drivers of Corporate investment banker Strain: Not on file  Food Insecurity: Not on file  Transportation Needs: Not on file  Physical Activity: Not on file  Stress: Not on file  Social Connections: Not on file  Intimate Partner Violence: Not on file    Past Surgical History:  Procedure Laterality Date   COLONOSCOPY  2015   DJ-MAC-polyps   LOOP RECORDER INSERTION N/A 02/19/2020   Procedure: LOOP RECORDER INSERTION;  Surgeon: Lei Pump, MD;  Location: [facility];  Service: Cardiovascular;  Laterality: N/A;   no prior surgery     POLYPECTOMY  2015   DJ-MAC-polyps    Family History  Problem Relation Age of Onset   Ovarian cancer Mother    Heart disease Father    Hypertension Father    Colon polyps Sister 71   Colon cancer Neg Hx    Esophageal cancer Neg Hx    Rectal cancer Neg Hx    Stomach cancer Neg Hx     Allergies  Allergen Reactions   Dust Mite Extract    Molds & Smuts     Current Outpatient Medications on File Prior to Visit  Medication Sig Dispense Refill   amLODipine  (NORVASC ) 5 MG tablet Take 1 tablet (5 mg total) by mouth daily. 30 tablet 0   cholecalciferol  (VITAMIN D3) 25 MCG (1000 UNIT) tablet Take 1 tablet (1,000 Units total) by mouth daily. 30 tablet 0   clopidogrel  (PLAVIX ) 75 MG tablet Take 1 tablet (75 mg total) by mouth daily. DUE FOR ANNUAL EXAM 90 tablet 3   colchicine  0.6 MG tablet Take 1 tablet (0.6 mg total) by mouth 2 (two) times daily. 180 tablet 1   EPINEPHrine  0.3 mg/0.3 mL IJ SOAJ injection Inject 0.3 mg into the muscle as needed for anaphylaxis. 2 each 1   famotidine  (PEPCID ) 20 MG tablet Take 1 tablet (20 mg total) by mouth 2 (two) times daily as needed (hives). 60 tablet 5   fexofenadine   (ALLEGRA  ALLERGY ) 180 MG tablet Take 1 tablet (180 mg total) by mouth 2 (two) times daily as needed (hives). 60 tablet 5   losartan  (COZAAR ) 100 MG tablet Take 1 tablet (100 mg total) by mouth daily. 90 tablet 3   nirmatrelvir/ritonavir (PAXLOVID , 300/100,) 20 x 150 MG & 10 x 100MG  TBPK Patient GFR is 65 on 07/13/22.  Take nirmatrelvir (150 mg) two tablets twice daily for 5 days and ritonavir (100 mg) one tablet twice daily for  5 days.  Hold Colchicine  for 6 days, while on Paxlovid . 30 tablet 0   promethazine -dextromethorphan (PROMETHAZINE -DM) 6.25-15 MG/5ML syrup Take 5 mLs by mouth 4 (four) times daily as needed for cough. 118 mL 0   rosuvastatin  (CRESTOR ) 20 MG tablet Take 1 tablet (20 mg total) by mouth daily. 90 tablet 1   No current facility-administered medications on file prior to visit.    BP 136/80   Pulse 67   Temp 98.1 F (36.7 C) (Oral)   Ht 5\' 8"  (1.727 m)   Wt 171 lb (77.6 kg)   SpO2 97%   BMI 26.00 kg/m       Objective:   Physical Exam Vitals and nursing note reviewed.  Constitutional:      General: He is not in acute distress.    Appearance: Normal appearance. He is not ill-appearing.  HENT:     Head: Normocephalic and atraumatic.     Right Ear: Tympanic membrane, ear canal and external ear normal. There is no impacted cerumen.     Left Ear: Tympanic membrane, ear canal and external ear normal. There is no impacted cerumen.     Nose: Nose normal. No congestion or rhinorrhea.     Mouth/Throat:     Mouth: Mucous membranes are moist.     Pharynx: Oropharynx is clear.  Eyes:     Extraocular Movements: Extraocular movements intact.     Conjunctiva/sclera: Conjunctivae normal.     Pupils: Pupils are equal, round, and reactive to light.  Neck:     Vascular: No carotid bruit.  Cardiovascular:     Rate and Rhythm: Normal rate and regular rhythm.     Pulses: Normal pulses.     Heart sounds: No murmur heard.    No friction rub. No gallop.  Pulmonary:     Effort:  Pulmonary effort is normal.     Breath sounds: Normal breath sounds.  Abdominal:     General: Abdomen is flat. Bowel sounds are normal. There is no distension.     Palpations: Abdomen is soft. There is no mass.     Tenderness: There is no abdominal tenderness. There is no guarding or rebound.     Hernia: No hernia is present.  Musculoskeletal:        General: Normal range of motion.     Cervical back: Normal range of motion and neck supple.  Lymphadenopathy:     Cervical: No cervical adenopathy.  Skin:    General: Skin is warm and dry.     Capillary Refill: Capillary refill takes less than 2 seconds.  Neurological:     General: No focal deficit present.     Mental Status: He is alert and oriented to person, place, and time.  Psychiatric:        Mood and Affect: Mood normal.        Behavior: Behavior normal.        Thought Content: Thought content normal.        Judgment: Judgment normal.        Assessment & Plan:  1. Routine general medical examination at a health care facility (Primary) Today patient counseled on age appropriate routine health concerns for screening and prevention, each reviewed and up to date or declined. Immunizations reviewed and up to date or declined. Labs ordered and reviewed. Risk factors for depression reviewed and negative. Hearing function and visual acuity are intact. ADLs screened and addressed as needed. Functional ability and level of safety reviewed and  appropriate. Education, counseling and referrals performed based on assessed risks today. Patient provided with a copy of personalized plan for preventive services. - Follow up in one year  - Continue to stay active and eat healthy  - Call and schedule Colonoscopy   2. Essential hypertension - Controlled. No change in medication  - Lipid panel; Future - TSH; Future - CBC; Future - Comprehensive metabolic panel with GFR; Future - Comprehensive metabolic panel with GFR - CBC - TSH - Lipid  panel  3. Hyperlipidemia, unspecified hyperlipidemia type - Consider increase in statin  - Lipid panel; Future - TSH; Future - CBC; Future - Comprehensive metabolic panel with GFR; Future - Comprehensive metabolic panel with GFR - CBC - TSH - Lipid panel  4. Cryptogenic stroke (HCC) - Continue statin and asa - Lipid panel; Future - TSH; Future - CBC; Future - Comprehensive metabolic panel with GFR; Future - Comprehensive metabolic panel with GFR - CBC - TSH - Lipid panel  5. Gastroesophageal reflux disease without esophagitis - Continue PPI   6. History of gout - Continue with Colchicine    7. Elevated PSA - Follow up with Urology  - PSA; Future - PSA  Alto Atta, NP

## 2023-06-02 ENCOUNTER — Other Ambulatory Visit (HOSPITAL_COMMUNITY): Payer: Self-pay

## 2023-06-02 ENCOUNTER — Other Ambulatory Visit: Payer: Self-pay | Admitting: Adult Health

## 2023-06-05 ENCOUNTER — Other Ambulatory Visit (HOSPITAL_COMMUNITY): Payer: Self-pay

## 2023-06-07 ENCOUNTER — Other Ambulatory Visit (HOSPITAL_COMMUNITY): Payer: Self-pay

## 2023-06-07 ENCOUNTER — Other Ambulatory Visit: Payer: Self-pay

## 2023-06-07 ENCOUNTER — Other Ambulatory Visit: Payer: Self-pay | Admitting: Adult Health

## 2023-06-07 MED ORDER — AMLODIPINE BESYLATE 5 MG PO TABS
5.0000 mg | ORAL_TABLET | Freq: Every day | ORAL | 0 refills | Status: DC
  Filled 2023-06-07: qty 30, 30d supply, fill #0

## 2023-06-07 NOTE — Telephone Encounter (Unsigned)
 Copied from CRM [id number]. Topic: Clinical - Medication Refill >> Jun 07, 2023  1:15 PM Shereese L wrote: Medication: amLODipine  (NORVASC ) 5 MG tablet    Has the patient contacted their pharmacy? Yes (Agent: If no, request that the patient contact the pharmacy for the refill. If patient does not wish to contact the pharmacy document the reason why and proceed with request.) (Agent: If yes, when and what did the pharmacy advise?)  This is the patient's preferred pharmacy:  [facility] [address] [place] [address] Phone: [phone] Fax: [phone]   Is this the correct pharmacy for this prescription? Yes If no, delete pharmacy and type the correct one.   Has the prescription been filled recently? Yes  Is the patient out of the medication? Yes  Has the patient been seen for an appointment in the last year OR does the patient have an upcoming appointment? Yes  Can we respond through MyChart? Yes  Agent: Please be advised that Rx refills may take up to 3 business days. We ask that you follow-up with your pharmacy.

## 2023-06-07 NOTE — Telephone Encounter (Unsigned)
 Copied from CRM [id number]. Topic: Clinical - Medication Question >> Jun 07, 2023  3:13 PM Magdalene School wrote: Reason for CRM:  Patient's wife called to follow up on a refill request for Amlodipine  (Norvasc ) 5 mg tablets, which she had called about earlier this morning 06/07/23. She mentioned that the patient has been out of the medication for two days.  I informed her that medication refills may take up to 3 business days to process. However, she requested that another message be sent to see if there's any way to expedite the refill due to the delay in medication.

## 2023-06-08 ENCOUNTER — Other Ambulatory Visit (HOSPITAL_COMMUNITY): Payer: Self-pay

## 2023-06-09 ENCOUNTER — Other Ambulatory Visit (HOSPITAL_COMMUNITY): Payer: Self-pay

## 2023-06-10 ENCOUNTER — Other Ambulatory Visit (HOSPITAL_COMMUNITY): Payer: Self-pay

## 2023-06-19 ENCOUNTER — Ambulatory Visit (INDEPENDENT_AMBULATORY_CARE_PROVIDER_SITE_OTHER)

## 2023-06-19 ENCOUNTER — Ambulatory Visit: Payer: Self-pay | Admitting: Cardiology

## 2023-06-19 DIAGNOSIS — I639 Cerebral infarction, unspecified: Secondary | ICD-10-CM | POA: Diagnosis not present

## 2023-06-19 LAB — CUP PACEART REMOTE DEVICE CHECK
Date Time Interrogation Session: 20250602000655
Implantable Pulse Generator Implant Date: 20220202

## 2023-06-23 ENCOUNTER — Other Ambulatory Visit (HOSPITAL_COMMUNITY): Payer: Self-pay

## 2023-06-23 ENCOUNTER — Other Ambulatory Visit: Payer: Self-pay

## 2023-06-23 ENCOUNTER — Other Ambulatory Visit: Payer: Self-pay | Admitting: Adult Health

## 2023-06-23 MED ORDER — AMLODIPINE BESYLATE 5 MG PO TABS
5.0000 mg | ORAL_TABLET | Freq: Every day | ORAL | 0 refills | Status: DC
  Filled 2023-06-23 – 2023-07-04 (×3): qty 30, 30d supply, fill #0

## 2023-06-29 NOTE — Progress Notes (Signed)
 [facility] Summary Report / Loop Recorder

## 2023-07-04 ENCOUNTER — Other Ambulatory Visit (HOSPITAL_COMMUNITY): Payer: Self-pay

## 2023-07-14 ENCOUNTER — Other Ambulatory Visit (HOSPITAL_COMMUNITY): Payer: Self-pay

## 2023-07-20 ENCOUNTER — Ambulatory Visit (INDEPENDENT_AMBULATORY_CARE_PROVIDER_SITE_OTHER)

## 2023-07-20 DIAGNOSIS — I639 Cerebral infarction, unspecified: Secondary | ICD-10-CM

## 2023-07-20 LAB — CUP PACEART REMOTE DEVICE CHECK
Date Time Interrogation Session: 20250703000435
Implantable Pulse Generator Implant Date: 20220202

## 2023-07-24 ENCOUNTER — Ambulatory Visit: Payer: Self-pay | Admitting: Cardiology

## 2023-08-04 ENCOUNTER — Other Ambulatory Visit (HOSPITAL_COMMUNITY): Payer: Self-pay

## 2023-08-04 ENCOUNTER — Other Ambulatory Visit: Payer: Self-pay | Admitting: Adult Health

## 2023-08-07 ENCOUNTER — Other Ambulatory Visit (HOSPITAL_COMMUNITY): Payer: Self-pay

## 2023-08-07 ENCOUNTER — Other Ambulatory Visit: Payer: Self-pay

## 2023-08-07 MED ORDER — AMLODIPINE BESYLATE 5 MG PO TABS
5.0000 mg | ORAL_TABLET | Freq: Every day | ORAL | 0 refills | Status: DC
  Filled 2023-08-07: qty 90, 90d supply, fill #0

## 2023-08-07 NOTE — Progress Notes (Signed)
 [facility] Summary Report / Loop Recorder

## 2023-08-18 ENCOUNTER — Other Ambulatory Visit (HOSPITAL_COMMUNITY): Payer: Self-pay

## 2023-08-21 ENCOUNTER — Ambulatory Visit (INDEPENDENT_AMBULATORY_CARE_PROVIDER_SITE_OTHER)

## 2023-08-21 DIAGNOSIS — I639 Cerebral infarction, unspecified: Secondary | ICD-10-CM | POA: Diagnosis not present

## 2023-08-21 LAB — CUP PACEART REMOTE DEVICE CHECK
Date Time Interrogation Session: 20250803000713
Implantable Pulse Generator Implant Date: 20220202

## 2023-08-22 ENCOUNTER — Ambulatory Visit: Payer: Self-pay | Admitting: Cardiology

## 2023-09-21 ENCOUNTER — Ambulatory Visit

## 2023-09-21 DIAGNOSIS — I639 Cerebral infarction, unspecified: Secondary | ICD-10-CM | POA: Diagnosis not present

## 2023-09-21 LAB — CUP PACEART REMOTE DEVICE CHECK
Date Time Interrogation Session: 20250904000351
Implantable Pulse Generator Implant Date: 20220202

## 2023-09-22 ENCOUNTER — Other Ambulatory Visit (HOSPITAL_COMMUNITY): Payer: Self-pay

## 2023-09-22 ENCOUNTER — Ambulatory Visit: Payer: Self-pay | Admitting: Cardiology

## 2023-09-30 NOTE — Progress Notes (Signed)
 Remote Loop Recorder Transmission

## 2023-10-16 NOTE — Progress Notes (Signed)
 Remote Loop Recorder Transmission

## 2023-10-17 ENCOUNTER — Other Ambulatory Visit (HOSPITAL_COMMUNITY): Payer: Self-pay

## 2023-10-23 ENCOUNTER — Encounter

## 2023-10-24 ENCOUNTER — Ambulatory Visit (INDEPENDENT_AMBULATORY_CARE_PROVIDER_SITE_OTHER)

## 2023-10-24 DIAGNOSIS — I639 Cerebral infarction, unspecified: Secondary | ICD-10-CM

## 2023-10-25 LAB — CUP PACEART REMOTE DEVICE CHECK
Date Time Interrogation Session: 20251007000304
Implantable Pulse Generator Implant Date: 20220202

## 2023-10-26 NOTE — Progress Notes (Signed)
 Remote Loop Recorder Transmission

## 2023-10-27 ENCOUNTER — Ambulatory Visit: Payer: Self-pay | Admitting: Cardiology

## 2023-10-27 NOTE — Progress Notes (Signed)
 Remote Loop Recorder Transmission

## 2023-11-06 ENCOUNTER — Other Ambulatory Visit: Payer: Self-pay | Admitting: Adult Health

## 2023-11-06 ENCOUNTER — Ambulatory Visit: Admitting: Physician Assistant

## 2023-11-06 ENCOUNTER — Other Ambulatory Visit (HOSPITAL_COMMUNITY): Payer: Self-pay

## 2023-11-06 ENCOUNTER — Encounter: Payer: Self-pay | Admitting: Physician Assistant

## 2023-11-06 VITALS — BP 136/76 | HR 88 | Ht 70.0 in | Wt 179.4 lb

## 2023-11-06 DIAGNOSIS — Z860101 Personal history of adenomatous and serrated colon polyps: Secondary | ICD-10-CM | POA: Diagnosis not present

## 2023-11-06 DIAGNOSIS — Z8673 Personal history of transient ischemic attack (TIA), and cerebral infarction without residual deficits: Secondary | ICD-10-CM | POA: Diagnosis not present

## 2023-11-06 DIAGNOSIS — Z7902 Long term (current) use of antithrombotics/antiplatelets: Secondary | ICD-10-CM

## 2023-11-06 DIAGNOSIS — Z7901 Long term (current) use of anticoagulants: Secondary | ICD-10-CM

## 2023-11-06 MED ORDER — NA SULFATE-K SULFATE-MG SULF 17.5-3.13-1.6 GM/177ML PO SOLN
1.0000 | ORAL | 0 refills | Status: DC
  Filled 2023-11-06: qty 354, 1d supply, fill #0

## 2023-11-06 NOTE — Progress Notes (Signed)
 Chief Complaint: Discuss screening for colorectal cancer on chronic anticoagulation  HPI:    Brent Mccormick is a 73 year old male with a past medical history as listed below including cryptogenic stroke on Plavix  (02/17/2020 echo with LVEF 60-65%, severe left ventricular hypertrophy) and now status post pacemaker, gout, previously known to Dr. Teressa, who presents to clinic today for discussion of a colonoscopy.    09/24/2019 colonoscopy done for personal history of colon polyps in 2011 and 2015 with a finding of eight 3-8 mm polyps in the descending, transverse, ascending colon and cecum.  Diverticulosis in the left colon.  Pathology showed mixture of tubular adenomas and hyperplastic polyps.  Repeat recommended in 3 years.    05/31/2023 patient seen by PCP and at that time discussed history of cryptogenic stroke initially presenting to the ER on 02/16/2020 with right sided weakness and facial droop, CT angio of the head and neck was negative for large vessel occlusion, MRI showed acute infarct in left basilar ganglia and radiating white matter tracts, received tPA and stays on Plavix  75 mg daily.  Full recovery.   5 /14/25 CBC, CMP and TSH all normal.    Today, the patient presents to clinic and tells me he is feeling very well.  He would like to have another colonoscopy as recommended by Dr. Teressa at time of his last.  Denies any acute GI complaints or concerns.  He is on Plavix  for cryptogenic stroke to be experienced in 2022 which never found the cause of.  Due to that he had a loop recorder placed which is still there, but tells me his abnormal rhythms.  No other cardiac or respiratory issues.    Nuys fever, chills or weight loss.      Past Medical History:  Diagnosis Date   Carpal tunnel syndrome    Gout    Hyperlipidemia    on meds   Hypertension    on meds    Past Surgical History:  Procedure Laterality Date   COLONOSCOPY  2015   DJ-MAC-polyps   LOOP RECORDER INSERTION N/A  02/19/2020   Procedure: LOOP RECORDER INSERTION;  Surgeon: Inocencio Soyla Lunger, MD;  Location: [facility];  Service: Cardiovascular;  Laterality: N/A;   no prior surgery     POLYPECTOMY  2015   DJ-MAC-polyps    Current Outpatient Medications  Medication Sig Dispense Refill   amLODipine  (NORVASC ) 5 MG tablet Take 1 tablet (5 mg total) by mouth daily. 90 tablet 0   cholecalciferol  (VITAMIN D3) 25 MCG (1000 UNIT) tablet Take 1 tablet (1,000 Units total) by mouth daily. 30 tablet 0   clopidogrel  (PLAVIX ) 75 MG tablet Take 1 tablet (75 mg total) by mouth daily. DUE FOR ANNUAL EXAM 90 tablet 3   colchicine  0.6 MG tablet Take 1 tablet (0.6 mg total) by mouth 2 (two) times daily. 180 tablet 1   EPINEPHrine  0.3 mg/0.3 mL IJ SOAJ injection Inject 0.3 mg into the muscle as needed for anaphylaxis. 2 each 1   famotidine  (PEPCID ) 20 MG tablet Take 1 tablet (20 mg total) by mouth 2 (two) times daily as needed (hives). 60 tablet 5   fexofenadine  (ALLEGRA  ALLERGY ) 180 MG tablet Take 1 tablet (180 mg total) by mouth 2 (two) times daily as needed (hives). 60 tablet 5   losartan  (COZAAR ) 100 MG tablet Take 1 tablet (100 mg total) by mouth daily. 90 tablet 3   nirmatrelvir/ritonavir (PAXLOVID , 300/100,) 20 x 150 MG & 10 x 100MG   TBPK Patient GFR is 65 on 07/13/22.  Take nirmatrelvir (150 mg) two tablets twice daily for 5 days and ritonavir (100 mg) one tablet twice daily for 5 days.  Hold Colchicine  for 6 days, while on Paxlovid . 30 tablet 0   promethazine -dextromethorphan (PROMETHAZINE -DM) 6.25-15 MG/5ML syrup Take 5 mLs by mouth 4 (four) times daily as needed for cough. 118 mL 0   rosuvastatin  (CRESTOR ) 20 MG tablet Take 1 tablet (20 mg total) by mouth daily. 90 tablet 3   No current facility-administered medications for this visit.    Allergies as of 11/06/2023 - Review Complete 05/31/2023  Allergen Reaction Noted   Dust mite extract  04/07/2022   Molds & smuts  04/07/2022    Family History   Problem Relation Age of Onset   Ovarian cancer Mother    Heart disease Father    Hypertension Father    Colon polyps Sister 33   Colon cancer Neg Hx    Esophageal cancer Neg Hx    Rectal cancer Neg Hx    Stomach cancer Neg Hx     Social History   Socioeconomic History   Marital status: Married    Spouse name: Not on file   Number of children: Not on file   Years of education: Not on file   Highest education level: Not on file  Occupational History   Not on file  Tobacco Use   Smoking status: Former    Current packs/day: 0.00    Types: Cigarettes    Quit date: 01/17/1966    Years since quitting: 57.8    Passive exposure: Never   Smokeless tobacco: Never  Vaping Use   Vaping status: Never Used  Substance and Sexual Activity   Alcohol use: Yes    Alcohol/week: 2.0 standard drinks of alcohol    Types: 2 Cans of beer per week    Comment: occassionally   Drug use: No   Sexual activity: Yes  Other Topics Concern   Not on file  Social History Narrative   ** Merged History Encounter **       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  Married  Two children, live locally.   Three dogs and two cats    Social Drivers of Corporate investment banker Strain: Not on file  Food Insecurity: Not on file  Transportation Needs: Not on file  Physical Activity: Not on file  Stress: Not on file  Social Connections: Not on file  Intimate Partner Violence: Not on file    Review of Systems:    Constitutional: No weight loss, fever or chills Skin: No rash Cardiovascular: No chest pain Respiratory: No SOB Gastrointestinal: See HPI and otherwise negative Genitourinary: No dysuria  Neurological: No headache, dizziness or syncope Musculoskeletal: No new muscle or joint pain Hematologic: No bleeding  Psychiatric: No history of depression or anxiety   Physical Exam:  Vital signs: BP 136/76 (BP Location: Left Arm, Patient Position:  Sitting, Cuff Size: Normal)   Pulse 88   Ht 5' 10 (1.778 m)   Wt 179 lb 6 oz (81.4 kg)   BMI 25.74 kg/m    Constitutional:   Pleasant elderly Caucasian male appears to be in NAD, Well developed, Well nourished, alert and cooperative Head:  Normocephalic and atraumatic. Eyes:   PEERL, EOMI. No icterus. Conjunctiva pink. Ears:  Normal auditory acuity. Neck:  Supple Throat: Oral cavity and pharynx without inflammation, swelling or lesion.  Respiratory: Respirations even and unlabored. Lungs clear to auscultation bilaterally.   No wheezes, crackles, or rhonchi.  Cardiovascular: Normal S1, S2. No MRG. Regular rate and rhythm. No peripheral edema, cyanosis or pallor.  Gastrointestinal:  Soft, nondistended, nontender. No rebound or guarding. Normal bowel sounds. No appreciable masses or hepatomegaly. Rectal:  Not performed.  Msk:  Symmetrical without gross deformities. Without edema, no deformity or joint abnormality.  Neurologic:  Alert and  oriented x4;  grossly normal neurologically.  Skin:   Dry and intact without significant lesions or rashes. Psychiatric:  Demonstrates good judgement and reason without abnormal affect or behaviors.  Most recent LABS AND IMAGING: CBC    Component Value Date/Time   WBC 7.2 05/31/2023 1109   RBC 4.93 05/31/2023 1109   HGB 15.1 05/31/2023 1109   HCT 44.5 05/31/2023 1109   PLT 259.0 05/31/2023 1109   MCV 90.2 05/31/2023 1109   MCH 30.4 03/02/2020 0603   MCHC 33.9 05/31/2023 1109   RDW 13.3 05/31/2023 1109   LYMPHSABS 1.7 07/13/2022 1511   MONOABS 0.6 07/13/2022 1511   EOSABS 0.2 07/13/2022 1511   BASOSABS 0.1 07/13/2022 1511    CMP     Component Value Date/Time   NA 141 05/31/2023 1109   NA 140 09/29/2020 1053   K 4.4 05/31/2023 1109   CL 104 05/31/2023 1109   CO2 27 05/31/2023 1109   GLUCOSE 104 (H) 05/31/2023 1109   BUN 17 05/31/2023 1109   BUN 21 09/29/2020 1053   CREATININE 1.03 05/31/2023 1109   CREATININE 1.01 07/04/2016 1537    CALCIUM  9.9 05/31/2023 1109   PROT 7.4 05/31/2023 1109   PROT 7.7 09/29/2020 1053   ALBUMIN 4.9 05/31/2023 1109   ALBUMIN 5.2 (H) 09/29/2020 1053   AST 20 05/31/2023 1109   ALT 24 05/31/2023 1109   ALKPHOS 46 05/31/2023 1109   BILITOT 0.5 05/31/2023 1109   BILITOT 0.4 09/29/2020 1053   GFRNONAA >60 03/02/2020 0603   GFRNONAA 77 07/04/2016 1537   GFRAA 89 07/04/2016 1537    Assessment: 1.  History of adenomatous polyps: Last colonoscopy in 2021 with removal of 8 polyps mixture of tubular adenomas and hyperplastic polyps, repeat recommended in 3 years, patient is now overdue 2.  History of stroke: Cryptogenic now with pacemaker placement on Plavix  3.  Chronic anticoagulation on Plavix   Plan: 1.  Scheduled for a surveillance colonoscopy given history of adenomatous polyps in the [facility] with Dr. Legrand.  Did provide the patient a detailed list of risk for procedure and he agrees to proceed. Patient is appropriate for endoscopic procedure(s) in the ambulatory ([facility]) setting.  2.  Patient advised to hold his Plavix  for 5 days prior to time of procedure.  We will communicate with his prescribing physician to ensure this is acceptable for him. 3.  Patient to follow in clinic per recommendations from Dr. Legrand after time of procedure.  Discussed this will likely be his last surveillance colonoscopy.  Delon Failing, PA-C [facility] 11/06/2023, 8:12 AM  Cc: Nafziger, Cory, NP

## 2023-11-06 NOTE — Patient Instructions (Signed)
 We have sent the following medications to your pharmacy for you to pick up at your convenience: Suprep   You have been scheduled for a colonoscopy. Please follow written instructions given to you at your visit today.   If you use inhalers (even only as needed), please bring them with you on the day of your procedure.  DO NOT TAKE 7 DAYS PRIOR TO TEST- Trulicity (dulaglutide) Ozempic, Wegovy (semaglutide) Mounjaro (tirzepatide) Bydureon Bcise (exanatide extended release)  DO NOT TAKE 1 DAY PRIOR TO YOUR TEST Rybelsus (semaglutide) Adlyxin (lixisenatide) Victoza (liraglutide) Byetta (exanatide) ___________________________________________________________________________   Due to recent changes in healthcare laws, you may see the results of your imaging and laboratory studies on MyChart before your provider has had a chance to review them.  We understand that in some cases there may be results that are confusing or concerning to you. Not all laboratory results come back in the same time frame and the provider may be waiting for multiple results in order to interpret others.  Please give us  48 hours in order for your provider to thoroughly review all the results before contacting the office for clarification of your results.   Thank you for choosing me and [facility].  Delon Failing, PA-C

## 2023-11-07 ENCOUNTER — Other Ambulatory Visit (HOSPITAL_COMMUNITY): Payer: Self-pay

## 2023-11-07 ENCOUNTER — Other Ambulatory Visit: Payer: Self-pay

## 2023-11-07 MED ORDER — AMLODIPINE BESYLATE 5 MG PO TABS
5.0000 mg | ORAL_TABLET | Freq: Every day | ORAL | 0 refills | Status: DC
  Filled 2023-11-07: qty 90, 90d supply, fill #0

## 2023-11-08 ENCOUNTER — Other Ambulatory Visit (HOSPITAL_COMMUNITY): Payer: Self-pay

## 2023-11-09 ENCOUNTER — Other Ambulatory Visit (HOSPITAL_COMMUNITY): Payer: Self-pay

## 2023-11-10 ENCOUNTER — Other Ambulatory Visit (HOSPITAL_COMMUNITY): Payer: Self-pay

## 2023-11-14 ENCOUNTER — Other Ambulatory Visit (HOSPITAL_COMMUNITY): Payer: Self-pay

## 2023-11-23 ENCOUNTER — Encounter

## 2023-11-24 ENCOUNTER — Ambulatory Visit (INDEPENDENT_AMBULATORY_CARE_PROVIDER_SITE_OTHER)

## 2023-11-24 DIAGNOSIS — I639 Cerebral infarction, unspecified: Secondary | ICD-10-CM

## 2023-11-26 LAB — CUP PACEART REMOTE DEVICE CHECK
Date Time Interrogation Session: 20251107000114
Implantable Pulse Generator Implant Date: 20220202

## 2023-11-28 ENCOUNTER — Ambulatory Visit: Payer: Self-pay | Admitting: Cardiology

## 2023-11-28 NOTE — Progress Notes (Signed)
 Remote Loop Recorder Transmission

## 2023-12-15 ENCOUNTER — Other Ambulatory Visit: Payer: Self-pay | Admitting: Adult Health

## 2023-12-15 ENCOUNTER — Other Ambulatory Visit (HOSPITAL_COMMUNITY): Payer: Self-pay

## 2023-12-15 DIAGNOSIS — M109 Gout, unspecified: Secondary | ICD-10-CM

## 2023-12-17 ENCOUNTER — Other Ambulatory Visit (HOSPITAL_COMMUNITY): Payer: Self-pay

## 2023-12-17 MED ORDER — COLCHICINE 0.6 MG PO TABS
0.6000 mg | ORAL_TABLET | Freq: Two times a day (BID) | ORAL | 1 refills | Status: AC
  Filled 2023-12-17 – 2024-02-05 (×6): qty 180, 90d supply, fill #0

## 2023-12-18 ENCOUNTER — Other Ambulatory Visit (HOSPITAL_COMMUNITY): Payer: Self-pay

## 2023-12-19 ENCOUNTER — Other Ambulatory Visit: Payer: Self-pay

## 2023-12-20 ENCOUNTER — Encounter: Payer: Self-pay | Admitting: Internal Medicine

## 2023-12-22 ENCOUNTER — Other Ambulatory Visit: Payer: Self-pay

## 2023-12-25 ENCOUNTER — Encounter

## 2023-12-25 ENCOUNTER — Ambulatory Visit

## 2023-12-25 DIAGNOSIS — I639 Cerebral infarction, unspecified: Secondary | ICD-10-CM | POA: Diagnosis not present

## 2023-12-26 LAB — CUP PACEART REMOTE DEVICE CHECK
Date Time Interrogation Session: 20251208000519
Implantable Pulse Generator Implant Date: 20220202

## 2023-12-27 ENCOUNTER — Ambulatory Visit: Payer: Self-pay | Admitting: Cardiology

## 2023-12-27 ENCOUNTER — Telehealth: Payer: Self-pay | Admitting: Internal Medicine

## 2023-12-27 NOTE — Telephone Encounter (Signed)
 Inbound call from patient stating he has an upcoming procedure scheduled for tomorrow 12/28/23 and was to withhold blood thinner medication but forgot, would like to be advised on what to do and if he is going to have to reschedule procedure.  Requesting a call back  Please advise  Thank you

## 2023-12-27 NOTE — Telephone Encounter (Signed)
 Spoke with pt.  States he forgot and has been taking Plavix .  Was instructed to stop Plavix  5 days prior to procedure but has been taking Plavix  daily- last dose was 12-26-23.  Rescheduled to 01-24-24 at 1:30 pm.  Understanding voiced.  New instructions mailed to patient.

## 2023-12-28 ENCOUNTER — Encounter: Admitting: Internal Medicine

## 2023-12-29 ENCOUNTER — Other Ambulatory Visit: Payer: Self-pay

## 2023-12-29 ENCOUNTER — Other Ambulatory Visit (HOSPITAL_COMMUNITY): Payer: Self-pay

## 2024-01-02 NOTE — Progress Notes (Signed)
 Remote Loop Recorder Transmission

## 2024-01-03 ENCOUNTER — Other Ambulatory Visit: Payer: Self-pay

## 2024-01-09 ENCOUNTER — Other Ambulatory Visit: Payer: Self-pay

## 2024-01-09 ENCOUNTER — Other Ambulatory Visit (HOSPITAL_COMMUNITY): Payer: Self-pay

## 2024-01-09 ENCOUNTER — Other Ambulatory Visit: Payer: Self-pay | Admitting: Adult Health

## 2024-01-09 MED ORDER — AMLODIPINE BESYLATE 5 MG PO TABS
5.0000 mg | ORAL_TABLET | Freq: Every day | ORAL | 0 refills | Status: AC
  Filled 2024-01-09 – 2024-01-20 (×2): qty 90, 90d supply, fill #0

## 2024-01-15 ENCOUNTER — Other Ambulatory Visit: Payer: Self-pay

## 2024-01-20 ENCOUNTER — Other Ambulatory Visit (HOSPITAL_COMMUNITY): Payer: Self-pay

## 2024-01-23 ENCOUNTER — Other Ambulatory Visit: Payer: Self-pay

## 2024-01-23 NOTE — Progress Notes (Unsigned)
 [facility] History and Physical   Primary Care Physician:  Merna Huxley, NP   Reason for Procedure:    Encounter Diagnosis  Name Primary?   History of adenomatous polyp of colon Yes     Plan:    colonoscopy   The patient was provided an opportunity to ask questions and all were answered. The patient agreed with the plan.   HPI: Brent Mccormick. is a 74 y.o. male w/ hx of adenomatous colon polyps - last colonoscopy 09/2019.8 small polyps removed and at least several were adenomas. Also had left diverticulosis. He is on Plavix  due to hx of stroke, and that has been held.       Past Medical History:  Diagnosis Date   Carpal tunnel syndrome    Gout    Hyperlipidemia    on meds   Hypertension    on meds    Past Surgical History:  Procedure Laterality Date   COLONOSCOPY  2015   DJ-MAC-polyps   LOOP RECORDER INSERTION N/A 02/19/2020   Procedure: LOOP RECORDER INSERTION;  Surgeon: Inocencio Soyla Lunger, MD;  Location: [facility];  Service: Cardiovascular;  Laterality: N/A;   no prior surgery     POLYPECTOMY  2015   DJ-MAC-polyps     Current Outpatient Medications  Medication Sig Dispense Refill   amLODipine  (NORVASC ) 5 MG tablet Take 1 tablet (5 mg total) by mouth daily. 90 tablet 0   cholecalciferol  (VITAMIN D3) 25 MCG (1000 UNIT) tablet Take 1 tablet (1,000 Units total) by mouth daily. 30 tablet 0   clopidogrel  (PLAVIX ) 75 MG tablet Take 1 tablet (75 mg total) by mouth daily. DUE FOR ANNUAL EXAM 90 tablet 3   colchicine  0.6 MG tablet Take 1 tablet (0.6 mg total) by mouth 2 (two) times daily. 180 tablet 1   EPINEPHrine  0.3 mg/0.3 mL IJ SOAJ injection Inject 0.3 mg into the muscle as needed for anaphylaxis. 2 each 1   famotidine  (PEPCID ) 20 MG tablet Take 1 tablet (20 mg total) by mouth 2 (two) times daily as needed (hives). 60 tablet 5   fexofenadine  (ALLEGRA  ALLERGY ) 180 MG tablet Take 1 tablet (180 mg total) by mouth 2 (two) times daily as needed  (hives). 60 tablet 5   losartan  (COZAAR ) 100 MG tablet Take 1 tablet (100 mg total) by mouth daily. 90 tablet 3   Na Sulfate-K Sulfate-Mg Sulfate concentrate (SUPREP BOWEL PREP KIT) 17.5-3.13-1.6 GM/177ML SOLN Take 1 kit (354 mLs total) by mouth as directed. For colonoscopy prep 354 mL 0   nirmatrelvir/ritonavir (PAXLOVID , 300/100,) 20 x 150 MG & 10 x 100MG  TBPK Patient GFR is 65 on 07/13/22.  Take nirmatrelvir (150 mg) two tablets twice daily for 5 days and ritonavir (100 mg) one tablet twice daily for 5 days.  Hold Colchicine  for 6 days, while on Paxlovid . 30 tablet 0   promethazine -dextromethorphan (PROMETHAZINE -DM) 6.25-15 MG/5ML syrup Take 5 mLs by mouth 4 (four) times daily as needed for cough. 118 mL 0   rosuvastatin  (CRESTOR ) 20 MG tablet Take 1 tablet (20 mg total) by mouth daily. 90 tablet 3   No current facility-administered medications for this visit.    Allergies as of 01/24/2024 - Review Complete 11/06/2023  Allergen Reaction Noted   Dust mite extract  04/07/2022   Molds & smuts  04/07/2022    Family History  Problem Relation Age of Onset   Ovarian cancer Mother    Heart disease Father    Hypertension Father  Colon polyps Sister 53   Colon cancer Neg Hx    Esophageal cancer Neg Hx    Rectal cancer Neg Hx    Stomach cancer Neg Hx     Social History   Socioeconomic History   Marital status: Married    Spouse name: Not on file   Number of children: Not on file   Years of education: Not on file   Highest education level: Not on file  Occupational History   Not on file  Tobacco Use   Smoking status: Former    Current packs/day: 0.00    Types: Cigarettes    Quit date: 01/17/1966    Years since quitting: 58.0    Passive exposure: Never   Smokeless tobacco: Never  Vaping Use   Vaping status: Never Used  Substance and Sexual Activity   Alcohol use: Yes    Alcohol/week: 2.0 standard drinks of alcohol    Types: 2 Cans of beer per week    Comment: occassionally    Drug use: No   Sexual activity: Yes  Other Topics Concern   Not on file  Social History Narrative   ** Merged History Encounter **       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  Married  Two children, live locally.   Three dogs and two cats    Social Drivers of Health   Tobacco Use: Medium Risk (11/06/2023)   Patient History    Smoking Tobacco Use: Former    Smokeless Tobacco Use: Never    Passive Exposure: Never  Programmer, Applications: Not on file  Food Insecurity: Not on file  Transportation Needs: Not on file  Physical Activity: Not on file  Stress: Not on file  Social Connections: Not on file  Intimate Partner Violence: Not on file  Depression (PHQ2-9): Low Risk (05/31/2023)   Depression (PHQ2-9)    PHQ-2 Score: 0  Alcohol Screen: Not on file  Housing: Not on file  Utilities: Not on file  Health Literacy: Not on file    Review of Systems: Positive for *** All other review of systems negative except as mentioned in the HPI.  Physical Exam: Vital signs There were no vitals taken for this visit.  General:   Alert,  Well-developed, well-nourished, pleasant and cooperative in NAD Lungs:  Clear throughout to auscultation.   Heart:  Regular rate and rhythm; no murmurs, clicks, rubs,  or gallops. Abdomen:  Soft, nontender and nondistended. Normal bowel sounds.   Neuro/Psych:  Alert and cooperative. Normal mood and affect. A and O x 3   @Brent Mccormick  [name], MD, [facility] [phone] (pager) 01/23/2024 7:55 PM@

## 2024-01-24 ENCOUNTER — Ambulatory Visit: Admitting: Internal Medicine

## 2024-01-24 ENCOUNTER — Encounter: Payer: Self-pay | Admitting: Internal Medicine

## 2024-01-24 VITALS — BP 128/80 | HR 74 | Temp 97.5°F | Resp 13 | Ht 70.0 in | Wt 179.8 lb

## 2024-01-24 DIAGNOSIS — D123 Benign neoplasm of transverse colon: Secondary | ICD-10-CM

## 2024-01-24 DIAGNOSIS — Z860101 Personal history of adenomatous and serrated colon polyps: Secondary | ICD-10-CM | POA: Diagnosis not present

## 2024-01-24 DIAGNOSIS — K635 Polyp of colon: Secondary | ICD-10-CM

## 2024-01-24 DIAGNOSIS — D128 Benign neoplasm of rectum: Secondary | ICD-10-CM | POA: Diagnosis not present

## 2024-01-24 DIAGNOSIS — Z1211 Encounter for screening for malignant neoplasm of colon: Secondary | ICD-10-CM | POA: Diagnosis present

## 2024-01-24 DIAGNOSIS — N400 Enlarged prostate without lower urinary tract symptoms: Secondary | ICD-10-CM

## 2024-01-24 DIAGNOSIS — K648 Other hemorrhoids: Secondary | ICD-10-CM | POA: Diagnosis not present

## 2024-01-24 DIAGNOSIS — K5730 Diverticulosis of large intestine without perforation or abscess without bleeding: Secondary | ICD-10-CM

## 2024-01-24 DIAGNOSIS — D127 Benign neoplasm of rectosigmoid junction: Secondary | ICD-10-CM | POA: Diagnosis not present

## 2024-01-24 DIAGNOSIS — D125 Benign neoplasm of sigmoid colon: Secondary | ICD-10-CM

## 2024-01-24 MED ORDER — SODIUM CHLORIDE 0.9 % IV SOLN: 500.0000 mL | Freq: Once | INTRAVENOUS | Status: DC

## 2024-01-24 NOTE — Progress Notes (Signed)
 To pacu, VSS. Report to [email or web address]

## 2024-01-24 NOTE — Progress Notes (Signed)
 Pt's states no medical or surgical changes since previsit or office visit.

## 2024-01-24 NOTE — Op Note (Signed)
 [facility] Patient Name: Brent Mccormick Procedure Date: 01/24/2024 2:38 PM MRN: [id number] Endoscopist: Lupita FORBES Commander , MD, [id number] Age: 74 Referring MD:  Date of Birth: [birth year] Gender: Male Account #: [id number] Procedure:                Colonoscopy Indications:              High risk colon cancer surveillance: Personal                            history of colonic polyps, Last colonoscopy: 2018 Medicines:                Monitored Anesthesia Care Procedure:                Pre-Anesthesia Assessment:                           - Prior to the procedure, a History and Physical                            was performed, and patient medications and                            allergies were reviewed. The patient's tolerance of                            previous anesthesia was also reviewed. The risks                            and benefits of the procedure and the sedation                            options and risks were discussed with the patient.                            All questions were answered, and informed consent                            was obtained. Prior Anticoagulants: The patient                            last took Plavix  (clopidogrel ) 5 days prior to the                            procedure. ASA Grade Assessment: III - A patient                            with severe systemic disease. After reviewing the                            risks and benefits, the patient was deemed in                            satisfactory condition to undergo the procedure.  After obtaining informed consent, the colonoscope                            was passed under direct vision. Throughout the                            procedure, the patient's blood pressure, pulse, and                            oxygen saturations were monitored continuously. The                            CF [id number] #[id number] was introduced through the anus                             and advanced to the the cecum, identified by                            appendiceal orifice and ileocecal valve. The                            colonoscopy was performed without difficulty. The                            patient tolerated the procedure well. The quality                            of the bowel preparation was good. The ileocecal                            valve, appendiceal orifice, and rectum were                            photographed. The bowel preparation used was SUPREP                            via split dose instruction. Scope In: 2:58:21 PM Scope Out: 3:14:14 PM Scope Withdrawal Time: 0 hours 11 minutes 50 seconds  Total Procedure Duration: 0 hours 15 minutes 53 seconds  Findings:                 The digital rectal exam findings include enlarged                            prostate.                           Three sessile and semi-pedunculated polyps were                            found in the rectum, sigmoid colon and transverse                            colon. The polyps were 2 to 7 mm in size. These  polyps were removed with a cold snare. Resection                            and retrieval were complete. Verification of                            patient identification for the specimen was done.                            Estimated blood loss was minimal.                           Multiple diverticula were found in the sigmoid                            colon, descending colon and transverse colon.                           Internal hemorrhoids were found.                           The exam was otherwise without abnormality on                            direct and retroflexion views. Complications:            No immediate complications. Estimated Blood Loss:     Estimated blood loss was minimal. Impression:               - Enlarged prostate found on digital rectal exam.                           - Three 2 to 7 mm polyps in the  rectum, in the                            sigmoid colon and in the transverse colon, removed                            with a cold snare. Resected and retrieved.                           - Diverticulosis in the sigmoid colon, in the                            descending colon and in the transverse colon.                           - Internal hemorrhoids.                           - The examination was otherwise normal on direct                            and retroflexion views.                           -  Personal history of colonic polyps. Recommendation:           - Patient has a contact number available for                            emergencies. The signs and symptoms of potential                            delayed complications were discussed with the                            patient. Return to normal activities tomorrow.                            Written discharge instructions were provided to the                            patient.                           - Resume previous diet.                           - Continue present medications.                           - Await pathology results.                           - No recommendation at this time regarding repeat                            colonoscopy due to age.                           - Resume Plavix  (clopidogrel ) at prior dose                            tomorrow. Lupita FORBES Commander, MD 01/24/2024 3:27:45 PM This report has been signed electronically.

## 2024-01-24 NOTE — Patient Instructions (Addendum)
 I found and removed 4 small polyps that look benign.  I will have those analyzed and let you know the recommendations though I do not think you should need to come back for a routine repeat colonoscopy.  I also saw diverticulosis and hemorrhoids.  Please read the handouts.  Resume Plavix  tomorrow.  I appreciate the opportunity to care for you. Lupita [name], MD, [facility]  Continue present medications. Await pathology results. No recommendation at this time regarding repeat colonoscopy due to age. Resume Plavix  (clopidogrel ) at prior dose tomorrow.   YOU HAD AN ENDOSCOPIC PROCEDURE TODAY AT THE [facility]:   Refer to the procedure report that was given to you for any specific questions about what was found during the examination.  If the procedure report does not answer your questions, please call your gastroenterologist to clarify.  If you requested that your care partner not be given the details of your procedure findings, then the procedure report has been included in a sealed envelope for you to review at your convenience later.  YOU SHOULD EXPECT: Some feelings of bloating in the abdomen. Passage of more gas than usual.  Walking can help get rid of the air that was put into your GI tract during the procedure and reduce the bloating. If you had a lower endoscopy (such as a colonoscopy or flexible sigmoidoscopy) you may notice spotting of blood in your stool or on the toilet paper. If you underwent a bowel prep for your procedure, you may not have a normal bowel movement for a few days.  Please Note:  You might notice some irritation and congestion in your nose or some drainage.  This is from the oxygen used during your procedure.  There is no need for concern and it should clear up in a day or so.  SYMPTOMS TO REPORT IMMEDIATELY:  Following lower endoscopy (colonoscopy or flexible sigmoidoscopy):  Excessive amounts of blood in the stool  Significant tenderness or worsening of  abdominal pains  Swelling of the abdomen that is new, acute  Fever of 100F or higher For urgent or emergent issues, a gastroenterologist can be reached at any hour by calling [phone]. Do not use MyChart messaging for urgent concerns.    DIET:  We do recommend a small meal at first, but then you may proceed to your regular diet.  Drink plenty of fluids but you should avoid alcoholic beverages for 24 hours.  ACTIVITY:  You should plan to take it easy for the rest of today and you should NOT DRIVE or use heavy machinery until tomorrow (because of the sedation medicines used during the test).    FOLLOW UP: Our staff will call the number listed on your records the next business day following your procedure.  We will call around 7:15- 8:00 am to check on you and address any questions or concerns that you may have regarding the information given to you following your procedure. If we do not reach you, we will leave a message.     If any biopsies were taken you will be contacted by phone or by letter within the next 1-3 weeks.  Please call us  at [phone] if you have not heard about the biopsies in 3 weeks.    SIGNATURES/CONFIDENTIALITY: You and/or your care partner have signed paperwork which will be entered into your electronic medical record.  These signatures attest to the fact that that the information above on your After Visit Summary has been reviewed  and is understood.  Full responsibility of the confidentiality of this discharge information lies with you and/or your care-partner.

## 2024-01-25 ENCOUNTER — Ambulatory Visit

## 2024-01-25 ENCOUNTER — Ambulatory Visit: Payer: Self-pay | Admitting: Cardiology

## 2024-01-25 ENCOUNTER — Telehealth: Payer: Self-pay

## 2024-01-25 ENCOUNTER — Encounter

## 2024-01-25 DIAGNOSIS — I639 Cerebral infarction, unspecified: Secondary | ICD-10-CM

## 2024-01-25 LAB — CUP PACEART REMOTE DEVICE CHECK
Date Time Interrogation Session: 20260108000311
Implantable Pulse Generator Implant Date: 20220202

## 2024-01-25 NOTE — Telephone Encounter (Signed)
 No answer, left message to call if having any issues or concerns, B.Vester Titsworth RN

## 2024-01-26 ENCOUNTER — Other Ambulatory Visit (HOSPITAL_COMMUNITY): Payer: Self-pay

## 2024-01-26 ENCOUNTER — Other Ambulatory Visit: Payer: Self-pay

## 2024-01-26 NOTE — Progress Notes (Signed)
 Remote Loop Recorder Transmission

## 2024-01-29 ENCOUNTER — Other Ambulatory Visit: Payer: Self-pay

## 2024-01-29 ENCOUNTER — Encounter: Payer: Self-pay | Admitting: Pharmacist

## 2024-01-29 LAB — SURGICAL PATHOLOGY

## 2024-01-30 ENCOUNTER — Ambulatory Visit: Payer: Self-pay | Admitting: Internal Medicine

## 2024-02-01 ENCOUNTER — Other Ambulatory Visit: Payer: Self-pay

## 2024-02-05 ENCOUNTER — Other Ambulatory Visit: Payer: Self-pay

## 2024-02-05 ENCOUNTER — Other Ambulatory Visit (HOSPITAL_COMMUNITY): Payer: Self-pay

## 2024-02-15 ENCOUNTER — Other Ambulatory Visit (HOSPITAL_COMMUNITY): Payer: Self-pay

## 2024-02-25 ENCOUNTER — Encounter

## 2024-03-27 ENCOUNTER — Encounter
